# Patient Record
Sex: Female | Born: 1965 | Race: Black or African American | Hispanic: No | Smoking: Never smoker
Health system: Southern US, Community
[De-identification: ages and names within clinical notes are randomized; demographics above are authoritative.]

## PROBLEM LIST (undated history)

## (undated) ENCOUNTER — Emergency Department (HOSPITAL_BASED_OUTPATIENT_CLINIC_OR_DEPARTMENT_OTHER): Payer: Managed Care, Other (non HMO) | Source: Home / Self Care

## (undated) ENCOUNTER — Emergency Department (HOSPITAL_COMMUNITY): Discharge status: Absent without leave | Payer: Managed Care, Other (non HMO)

## (undated) DIAGNOSIS — I10 Essential (primary) hypertension: Secondary | ICD-10-CM

## (undated) DIAGNOSIS — M069 Rheumatoid arthritis, unspecified: Secondary | ICD-10-CM

## (undated) DIAGNOSIS — K219 Gastro-esophageal reflux disease without esophagitis: Secondary | ICD-10-CM

## (undated) DIAGNOSIS — E7800 Pure hypercholesterolemia, unspecified: Secondary | ICD-10-CM

## (undated) DIAGNOSIS — T7840XA Allergy, unspecified, initial encounter: Secondary | ICD-10-CM

## (undated) DIAGNOSIS — K2970 Gastritis, unspecified, without bleeding: Secondary | ICD-10-CM

## (undated) DIAGNOSIS — Z8619 Personal history of other infectious and parasitic diseases: Secondary | ICD-10-CM

## (undated) DIAGNOSIS — M109 Gout, unspecified: Secondary | ICD-10-CM

## (undated) DIAGNOSIS — K760 Fatty (change of) liver, not elsewhere classified: Secondary | ICD-10-CM

## (undated) DIAGNOSIS — K589 Irritable bowel syndrome without diarrhea: Secondary | ICD-10-CM

## (undated) DIAGNOSIS — J45909 Unspecified asthma, uncomplicated: Secondary | ICD-10-CM

## (undated) DIAGNOSIS — K3184 Gastroparesis: Secondary | ICD-10-CM

## (undated) HISTORY — PX: REFRACTIVE SURGERY: SHX103

## (undated) HISTORY — DX: Gastro-esophageal reflux disease without esophagitis: K21.9

## (undated) HISTORY — PX: EYE SURGERY: SHX253

## (undated) HISTORY — DX: Gastroparesis: K31.84

## (undated) HISTORY — DX: Allergy, unspecified, initial encounter: T78.40XA

## (undated) HISTORY — PX: WISDOM TOOTH EXTRACTION: SHX21

## (undated) HISTORY — DX: Gastritis, unspecified, without bleeding: K29.70

## (undated) HISTORY — DX: Gout, unspecified: M10.9

## (undated) HISTORY — DX: Rheumatoid arthritis, unspecified: M06.9

## (undated) HISTORY — PX: CHOLECYSTECTOMY: SHX55

## (undated) HISTORY — DX: Unspecified asthma, uncomplicated: J45.909

## (undated) HISTORY — DX: Fatty (change of) liver, not elsewhere classified: K76.0

## (undated) HISTORY — DX: Personal history of other infectious and parasitic diseases: Z86.19

---

## 1997-08-09 ENCOUNTER — Other Ambulatory Visit: Admission: RE | Admit: 1997-08-09 | Discharge: 1997-08-09 | Payer: Self-pay | Admitting: Obstetrics and Gynecology

## 1997-11-29 ENCOUNTER — Encounter: Admission: RE | Admit: 1997-11-29 | Discharge: 1998-02-27 | Payer: Self-pay | Admitting: *Deleted

## 1998-03-07 ENCOUNTER — Encounter: Admission: RE | Admit: 1998-03-07 | Discharge: 1998-06-05 | Payer: Self-pay | Admitting: *Deleted

## 1998-07-04 ENCOUNTER — Encounter: Admission: RE | Admit: 1998-07-04 | Discharge: 1998-10-02 | Payer: Self-pay | Admitting: *Deleted

## 1998-11-16 ENCOUNTER — Encounter: Admission: RE | Admit: 1998-11-16 | Discharge: 1999-02-14 | Payer: Self-pay | Admitting: *Deleted

## 1999-01-01 ENCOUNTER — Other Ambulatory Visit: Admission: RE | Admit: 1999-01-01 | Discharge: 1999-01-01 | Payer: Self-pay | Admitting: *Deleted

## 1999-06-01 ENCOUNTER — Encounter: Payer: Self-pay | Admitting: *Deleted

## 1999-06-01 ENCOUNTER — Encounter: Admission: RE | Admit: 1999-06-01 | Discharge: 1999-06-01 | Payer: Self-pay | Admitting: *Deleted

## 1999-06-13 ENCOUNTER — Encounter: Admission: RE | Admit: 1999-06-13 | Discharge: 1999-09-11 | Payer: Self-pay | Admitting: *Deleted

## 1999-10-03 ENCOUNTER — Encounter: Admission: RE | Admit: 1999-10-03 | Discharge: 2000-01-01 | Payer: Self-pay | Admitting: *Deleted

## 1999-10-17 ENCOUNTER — Encounter: Admission: RE | Admit: 1999-10-17 | Discharge: 2000-01-15 | Payer: Self-pay | Admitting: *Deleted

## 1999-12-16 ENCOUNTER — Emergency Department (HOSPITAL_COMMUNITY): Admission: EM | Admit: 1999-12-16 | Discharge: 1999-12-16 | Payer: Self-pay | Admitting: Emergency Medicine

## 1999-12-16 ENCOUNTER — Encounter: Payer: Self-pay | Admitting: Emergency Medicine

## 2000-02-28 ENCOUNTER — Encounter (INDEPENDENT_AMBULATORY_CARE_PROVIDER_SITE_OTHER): Payer: Self-pay

## 2000-02-28 ENCOUNTER — Other Ambulatory Visit: Admission: RE | Admit: 2000-02-28 | Discharge: 2000-02-28 | Payer: Self-pay | Admitting: Obstetrics and Gynecology

## 2000-07-30 ENCOUNTER — Encounter: Admission: RE | Admit: 2000-07-30 | Discharge: 2000-10-28 | Payer: Self-pay | Admitting: *Deleted

## 2000-09-09 ENCOUNTER — Other Ambulatory Visit: Admission: RE | Admit: 2000-09-09 | Discharge: 2000-09-09 | Payer: Self-pay | Admitting: Obstetrics and Gynecology

## 2000-10-15 ENCOUNTER — Encounter: Payer: Self-pay | Admitting: *Deleted

## 2000-10-15 ENCOUNTER — Encounter: Admission: RE | Admit: 2000-10-15 | Discharge: 2000-10-15 | Payer: Self-pay | Admitting: *Deleted

## 2000-10-15 ENCOUNTER — Encounter (INDEPENDENT_AMBULATORY_CARE_PROVIDER_SITE_OTHER): Payer: Self-pay | Admitting: *Deleted

## 2000-11-26 ENCOUNTER — Encounter: Admission: RE | Admit: 2000-11-26 | Discharge: 2001-02-24 | Payer: Self-pay | Admitting: *Deleted

## 2000-12-09 ENCOUNTER — Other Ambulatory Visit: Admission: RE | Admit: 2000-12-09 | Discharge: 2000-12-09 | Payer: Self-pay | Admitting: Obstetrics and Gynecology

## 2001-03-03 ENCOUNTER — Other Ambulatory Visit: Admission: RE | Admit: 2001-03-03 | Discharge: 2001-03-03 | Payer: Self-pay | Admitting: Obstetrics and Gynecology

## 2001-09-28 ENCOUNTER — Encounter: Admission: RE | Admit: 2001-09-28 | Discharge: 2001-12-27 | Payer: Self-pay | Admitting: *Deleted

## 2001-10-27 ENCOUNTER — Encounter: Admission: RE | Admit: 2001-10-27 | Discharge: 2001-10-27 | Payer: Self-pay | Admitting: *Deleted

## 2001-10-27 ENCOUNTER — Encounter: Payer: Self-pay | Admitting: *Deleted

## 2001-12-28 ENCOUNTER — Encounter: Admission: RE | Admit: 2001-12-28 | Discharge: 2002-03-28 | Payer: Self-pay | Admitting: *Deleted

## 2001-12-28 ENCOUNTER — Other Ambulatory Visit: Admission: RE | Admit: 2001-12-28 | Discharge: 2001-12-28 | Payer: Self-pay | Admitting: Obstetrics and Gynecology

## 2002-05-26 ENCOUNTER — Encounter: Admission: RE | Admit: 2002-05-26 | Discharge: 2002-05-26 | Payer: Self-pay

## 2002-05-26 ENCOUNTER — Encounter: Admission: RE | Admit: 2002-05-26 | Discharge: 2002-08-24 | Payer: Self-pay

## 2002-06-08 ENCOUNTER — Encounter: Admission: RE | Admit: 2002-06-08 | Discharge: 2002-09-06 | Payer: Self-pay

## 2002-10-19 ENCOUNTER — Encounter: Admission: RE | Admit: 2002-10-19 | Discharge: 2003-01-17 | Payer: Self-pay

## 2003-01-06 ENCOUNTER — Other Ambulatory Visit: Admission: RE | Admit: 2003-01-06 | Discharge: 2003-01-06 | Payer: Self-pay | Admitting: Obstetrics and Gynecology

## 2003-02-28 ENCOUNTER — Encounter: Admission: RE | Admit: 2003-02-28 | Discharge: 2003-05-29 | Payer: Self-pay

## 2003-06-07 ENCOUNTER — Encounter: Admission: RE | Admit: 2003-06-07 | Discharge: 2003-06-07 | Payer: Self-pay | Admitting: Family Medicine

## 2003-07-19 ENCOUNTER — Encounter: Admission: RE | Admit: 2003-07-19 | Discharge: 2003-10-17 | Payer: Self-pay | Admitting: Family Medicine

## 2004-01-09 ENCOUNTER — Other Ambulatory Visit: Admission: RE | Admit: 2004-01-09 | Discharge: 2004-01-09 | Payer: Self-pay | Admitting: Obstetrics and Gynecology

## 2004-01-31 ENCOUNTER — Encounter: Admission: RE | Admit: 2004-01-31 | Discharge: 2004-01-31 | Payer: Self-pay

## 2004-05-14 ENCOUNTER — Encounter: Admission: RE | Admit: 2004-05-14 | Discharge: 2004-05-14 | Payer: Self-pay | Admitting: Neurology

## 2004-06-19 ENCOUNTER — Encounter: Admission: RE | Admit: 2004-06-19 | Discharge: 2004-06-19 | Payer: Self-pay | Admitting: Family Medicine

## 2004-06-19 ENCOUNTER — Encounter (INDEPENDENT_AMBULATORY_CARE_PROVIDER_SITE_OTHER): Payer: Self-pay | Admitting: *Deleted

## 2004-07-09 ENCOUNTER — Encounter: Admission: RE | Admit: 2004-07-09 | Discharge: 2004-10-07 | Payer: Self-pay | Admitting: Family Medicine

## 2004-07-11 ENCOUNTER — Ambulatory Visit: Payer: Self-pay | Admitting: Internal Medicine

## 2004-10-29 ENCOUNTER — Encounter: Admission: RE | Admit: 2004-10-29 | Discharge: 2005-01-27 | Payer: Self-pay | Admitting: Family Medicine

## 2004-11-01 ENCOUNTER — Ambulatory Visit: Payer: Self-pay | Admitting: Internal Medicine

## 2005-01-21 ENCOUNTER — Other Ambulatory Visit: Admission: RE | Admit: 2005-01-21 | Discharge: 2005-01-21 | Payer: Self-pay | Admitting: Obstetrics and Gynecology

## 2005-10-21 ENCOUNTER — Ambulatory Visit: Payer: Self-pay | Admitting: Internal Medicine

## 2005-10-22 ENCOUNTER — Ambulatory Visit: Payer: Self-pay | Admitting: Internal Medicine

## 2005-12-13 ENCOUNTER — Encounter: Admission: RE | Admit: 2005-12-13 | Discharge: 2005-12-13 | Payer: Self-pay | Admitting: Family Medicine

## 2006-02-07 ENCOUNTER — Other Ambulatory Visit: Admission: RE | Admit: 2006-02-07 | Discharge: 2006-02-07 | Payer: Self-pay | Admitting: Obstetrics and Gynecology

## 2006-04-21 ENCOUNTER — Encounter: Admission: RE | Admit: 2006-04-21 | Discharge: 2006-04-21 | Payer: Self-pay | Admitting: Family Medicine

## 2006-06-03 ENCOUNTER — Encounter: Admission: RE | Admit: 2006-06-03 | Discharge: 2006-09-01 | Payer: Self-pay | Admitting: Family Medicine

## 2006-09-10 ENCOUNTER — Encounter: Admission: RE | Admit: 2006-09-10 | Discharge: 2006-09-10 | Payer: Self-pay | Admitting: Family Medicine

## 2006-12-21 ENCOUNTER — Emergency Department (HOSPITAL_COMMUNITY): Admission: EM | Admit: 2006-12-21 | Discharge: 2006-12-21 | Payer: Self-pay | Admitting: Emergency Medicine

## 2007-01-14 ENCOUNTER — Encounter: Admission: RE | Admit: 2007-01-14 | Discharge: 2007-01-14 | Payer: Self-pay | Admitting: Family Medicine

## 2007-02-09 ENCOUNTER — Other Ambulatory Visit: Admission: RE | Admit: 2007-02-09 | Discharge: 2007-02-09 | Payer: Self-pay | Admitting: Obstetrics and Gynecology

## 2007-06-05 ENCOUNTER — Encounter: Admission: RE | Admit: 2007-06-05 | Discharge: 2007-06-05 | Payer: Self-pay | Admitting: Family Medicine

## 2007-06-05 ENCOUNTER — Encounter (INDEPENDENT_AMBULATORY_CARE_PROVIDER_SITE_OTHER): Payer: Self-pay | Admitting: *Deleted

## 2008-03-01 ENCOUNTER — Telehealth: Payer: Self-pay | Admitting: Internal Medicine

## 2008-03-03 DIAGNOSIS — I10 Essential (primary) hypertension: Secondary | ICD-10-CM | POA: Insufficient documentation

## 2008-03-03 DIAGNOSIS — K589 Irritable bowel syndrome without diarrhea: Secondary | ICD-10-CM | POA: Insufficient documentation

## 2008-03-03 DIAGNOSIS — K625 Hemorrhage of anus and rectum: Secondary | ICD-10-CM | POA: Insufficient documentation

## 2008-03-03 DIAGNOSIS — I152 Hypertension secondary to endocrine disorders: Secondary | ICD-10-CM | POA: Insufficient documentation

## 2008-03-03 DIAGNOSIS — K219 Gastro-esophageal reflux disease without esophagitis: Secondary | ICD-10-CM | POA: Insufficient documentation

## 2008-03-03 DIAGNOSIS — E785 Hyperlipidemia, unspecified: Secondary | ICD-10-CM | POA: Insufficient documentation

## 2008-03-03 DIAGNOSIS — K582 Mixed irritable bowel syndrome: Secondary | ICD-10-CM | POA: Insufficient documentation

## 2008-03-07 ENCOUNTER — Ambulatory Visit: Payer: Self-pay | Admitting: Internal Medicine

## 2008-03-08 ENCOUNTER — Telehealth: Payer: Self-pay | Admitting: Internal Medicine

## 2008-03-09 DIAGNOSIS — M549 Dorsalgia, unspecified: Secondary | ICD-10-CM | POA: Insufficient documentation

## 2008-03-16 ENCOUNTER — Ambulatory Visit: Payer: Self-pay | Admitting: Internal Medicine

## 2008-05-25 ENCOUNTER — Encounter: Admission: RE | Admit: 2008-05-25 | Discharge: 2008-05-25 | Payer: Self-pay | Admitting: Allergy

## 2008-10-22 ENCOUNTER — Encounter: Admission: RE | Admit: 2008-10-22 | Discharge: 2008-10-22 | Payer: Self-pay | Admitting: Family Medicine

## 2008-10-22 ENCOUNTER — Encounter (INDEPENDENT_AMBULATORY_CARE_PROVIDER_SITE_OTHER): Payer: Self-pay | Admitting: *Deleted

## 2008-11-13 ENCOUNTER — Emergency Department (HOSPITAL_COMMUNITY): Admission: EM | Admit: 2008-11-13 | Discharge: 2008-11-13 | Payer: Self-pay | Admitting: Emergency Medicine

## 2009-03-04 ENCOUNTER — Encounter: Admission: RE | Admit: 2009-03-04 | Discharge: 2009-03-04 | Payer: Self-pay | Admitting: Orthopedic Surgery

## 2009-03-11 ENCOUNTER — Emergency Department (HOSPITAL_COMMUNITY): Admission: EM | Admit: 2009-03-11 | Discharge: 2009-03-12 | Payer: Self-pay | Admitting: Emergency Medicine

## 2009-03-11 ENCOUNTER — Encounter: Payer: Self-pay | Admitting: Cardiology

## 2009-03-13 ENCOUNTER — Telehealth: Payer: Self-pay | Admitting: Cardiology

## 2009-03-13 ENCOUNTER — Telehealth: Payer: Self-pay | Admitting: Internal Medicine

## 2009-03-15 ENCOUNTER — Ambulatory Visit: Payer: Self-pay | Admitting: Internal Medicine

## 2009-03-15 DIAGNOSIS — E1151 Type 2 diabetes mellitus with diabetic peripheral angiopathy without gangrene: Secondary | ICD-10-CM | POA: Insufficient documentation

## 2009-03-17 ENCOUNTER — Telehealth: Payer: Self-pay | Admitting: Cardiology

## 2009-04-05 ENCOUNTER — Ambulatory Visit: Payer: Self-pay | Admitting: Cardiology

## 2009-04-05 ENCOUNTER — Encounter: Payer: Self-pay | Admitting: Physician Assistant

## 2009-04-05 DIAGNOSIS — E669 Obesity, unspecified: Secondary | ICD-10-CM | POA: Insufficient documentation

## 2009-04-05 DIAGNOSIS — R002 Palpitations: Secondary | ICD-10-CM | POA: Insufficient documentation

## 2009-04-05 DIAGNOSIS — I517 Cardiomegaly: Secondary | ICD-10-CM | POA: Insufficient documentation

## 2009-04-07 LAB — CONVERTED CEMR LAB: TSH: 0.94 microintl units/mL (ref 0.35–5.50)

## 2009-04-24 ENCOUNTER — Ambulatory Visit: Payer: Self-pay | Admitting: Cardiology

## 2009-04-24 ENCOUNTER — Encounter: Payer: Self-pay | Admitting: Cardiology

## 2009-04-24 ENCOUNTER — Ambulatory Visit: Payer: Self-pay

## 2009-04-24 ENCOUNTER — Ambulatory Visit (HOSPITAL_COMMUNITY): Admission: RE | Admit: 2009-04-24 | Discharge: 2009-04-24 | Payer: Self-pay | Admitting: Cardiology

## 2009-05-03 ENCOUNTER — Encounter: Payer: Self-pay | Admitting: Cardiology

## 2009-05-05 ENCOUNTER — Telehealth: Payer: Self-pay | Admitting: Internal Medicine

## 2009-05-12 ENCOUNTER — Ambulatory Visit: Payer: Self-pay | Admitting: Cardiology

## 2009-05-12 DIAGNOSIS — R079 Chest pain, unspecified: Secondary | ICD-10-CM | POA: Insufficient documentation

## 2009-05-26 ENCOUNTER — Encounter: Payer: Self-pay | Admitting: Internal Medicine

## 2009-05-30 ENCOUNTER — Ambulatory Visit: Payer: Self-pay | Admitting: Internal Medicine

## 2009-05-30 LAB — CONVERTED CEMR LAB
Fecal Occult Blood: NEGATIVE
OCCULT 1: NEGATIVE
OCCULT 2: NEGATIVE
OCCULT 3: NEGATIVE
OCCULT 4: NEGATIVE
OCCULT 5: NEGATIVE

## 2009-06-20 ENCOUNTER — Ambulatory Visit: Payer: Self-pay | Admitting: Cardiology

## 2009-06-28 ENCOUNTER — Encounter: Payer: Self-pay | Admitting: Cardiology

## 2009-09-25 ENCOUNTER — Telehealth: Payer: Self-pay | Admitting: Internal Medicine

## 2010-01-16 ENCOUNTER — Ambulatory Visit: Payer: Self-pay | Admitting: Internal Medicine

## 2010-05-15 ENCOUNTER — Telehealth: Payer: Self-pay | Admitting: Internal Medicine

## 2010-05-17 ENCOUNTER — Telehealth: Payer: Self-pay | Admitting: Internal Medicine

## 2010-05-18 ENCOUNTER — Encounter: Payer: Self-pay | Admitting: Internal Medicine

## 2010-05-18 ENCOUNTER — Ambulatory Visit
Admission: RE | Admit: 2010-05-18 | Discharge: 2010-05-18 | Payer: Self-pay | Source: Home / Self Care | Attending: Internal Medicine | Admitting: Internal Medicine

## 2010-05-18 ENCOUNTER — Other Ambulatory Visit: Payer: Self-pay | Admitting: Internal Medicine

## 2010-05-18 DIAGNOSIS — R109 Unspecified abdominal pain: Secondary | ICD-10-CM | POA: Insufficient documentation

## 2010-05-18 DIAGNOSIS — J328 Other chronic sinusitis: Secondary | ICD-10-CM | POA: Insufficient documentation

## 2010-05-24 NOTE — Letter (Signed)
Summary: Physician Documentation Sheet  Physician Documentation Sheet   Imported By: Kassie Mends 05/08/2009 14:28:05  _____________________________________________________________________  External Attachment:    Type:   Image     Comment:   External Document

## 2010-05-24 NOTE — Progress Notes (Signed)
Summary: Hemorroids  Phone Note Call from Patient Call back at [phone]   Call For: DR Blair Mesina Reason for Call: Talk to Nurse Summary of Call: 2WKhaving difficulties with her hemorroids. Her primary care Dr Sonia Baller resigned and wonder if we can give her some advice until her follow up on 04-04-08. Initial call taken by: Leanor Kail [facility],  March 01, 2008 12:39 PM  Follow-up for Phone Call        Spoke with patient this afternoom c/o hemmorroids. small amout blood on tissue after wiping, c/o itching and irratation in the anal area.  Has been going on for 2-3 weeks. Patient also c/o constipation.  Patient was seeing PCP for this problem but PCP left practice. 1) anusol HC Suppository 1 pr q HS x 7-10 days SITZ bath TID TUCS pads to rectum TID use baby wipes instead of toilet paper  2) Pt instructed to begin Miralax 17 gm or 1 capfull in 8oz of liquid daily as needed 3) keep appointment 0n Nov. 16th at 3pm 4) Pt instructed to call back as needed for any problems, questions, or concerns 5) I will call patient if any new orders after md has reviewed note.          Follow-up by: Paulene Floor, RN,  March 01, 2008 3:24 PM  Additional Follow-up for Phone Call Additional follow up Details #1::        I agree. Additional Follow-up by: Hart Carwin MD,  March 01, 2008 9:45 PM    New/Updated Medications: ANUSOL-HC 25 MG SUPP (HYDROCORTISONE ACETATE) 1! supp in rectum at night for10  days   Prescriptions: ANUSOL-HC 25 MG SUPP (HYDROCORTISONE ACETATE) 1! supp in rectum at night for10  days  #10 x 1   Entered by:   Paulene Floor, RN   Authorized by:   Hart Carwin MD   Signed by:   Paulene Floor, RN on 03/01/2008   Method used:   Electronically to        [facility]. [phone]* (retail)       [address]       [place], [place]  [address]       Ph: [phone]       Fax: [phone]   RxID:   [id number]

## 2010-05-24 NOTE — Progress Notes (Signed)
Summary: speak to nurse  Phone Note Call from Patient Call back at Home Phone [phone]   Caller: Patient Call For: Dr Juanda Chance Reason for Call: Talk to Nurse Summary of Call: Patient wants to speak to nurse regarding her appt tomorrow. Initial call taken by: Tawni Levy,  May 17, 2010 3:47 PM  Follow-up for Phone Call        Patient called to r/s appointment to next week because she started her cycle. Told her MD is not in office for the next 2 weeks. She decided to keep the appointment for tomorrow. Follow-up by: Jesse Fall RN,  May 17, 2010 4:00 PM

## 2010-05-24 NOTE — Progress Notes (Signed)
Summary: Zegrid samples  Phone Note Call from Patient Call back at [facility] Phone [phone]   Call For: Dr Juanda Chance Reason for Call: Talk to Nurse Summary of Call: Wonders if we have any samples of Zegrid? Initial call taken by: Leanor Kail [facility],  September 25, 2009 3:45 PM  Follow-up for Phone Call        Advised patient that we have no samples of Zegerid, however, we will give her 2 boxes of Dexilant until she gets paid (and can get her Zegerid refilled).  Samples Given #2 boxes   -Lot Number: [id number] e exp: 10/12 Follow-up by: Lamona Curl CMA (AAMA),  September 25, 2009 4:28 PM

## 2010-05-24 NOTE — Assessment & Plan Note (Signed)
Summary: eph/jml   Visit Type:  Follow-up Primary Provider:  Noel Journey, MD  CC:  palpitations.  History of Present Illness: This is a 45 year old, African American female, patient, who is referred here from the muscles to the emergency room. She went to the ER November 20 with palpitations. She had 3 days of intermittent rapid heart beat that would last 15-30 seconds at a time. She said just would race and then stop. She denied any skipping, dizziness, or presyncope. She denies chest pain or dyspnea. She has a remote history of this and saw Dr. Charlies Constable in the past at which time she has had a stress test and an echocardiogram many years ago. She has been told she has an enlarged heart, but has not had a followup. In the ER. Her first CK was elevated in the 400 range, but MBs were negative. Troponins were negative. Her repeat CK was negative. TSH was not performed. Potassium is 3.7, and they did give her 2 tablets. She has not had any recurrent palpitations since then.  She drinks about 2 cups of tea per day, but otherwise does not drink excessive caffeine. She is overweight and has lost 11 pounds, despite cutting back on the fried foods, that she eats, but knows she needs to lose weight. She does not exercise on a regular basis, but does not have palpitations when she exerts herself. She says she is under quite a bit of stress recently as she is trying to plan her own wedding for next September.   Problems Prior to Update: 1)  Cardiomegaly  (ICD-429.3) 2)  Palpitations  (ICD-785.1) 3)  Diabetes Mellitus  (ICD-250.00) 4)  Back Pain  (ICD-724.5) 5)  Rectal Bleeding  (ICD-569.3) 6)  Hyperlipidemia  (ICD-272.4) 7)  Hypertension  (ICD-401.9) 8)  Gerd  (ICD-530.81) 9)  Irritable Bowel Syndrome  (ICD-564.1)  Current Medications (verified): 1)  Anusol-Hc 25 Mg Supp (Hydrocortisone Acetate) .Marland Kitchen.. 1! Supp in Rectum At Night For10  Days  2)  Benicar 20 Mg Tabs (Olmesartan Medoxomil) .... One Tablet By Mouth Once Daily 3)  Dyazide 37.5-25 Mg Caps (Triamterene-Hctz) .... One Tablet By Mouth Once Daily 4)  Zegerid 40-1100 Mg Caps (Omeprazole-Sodium Bicarbonate) .... One Tablet By Mouth Two Times A Day 5)  Lovaza 1 Gm Caps (Omega-3-Acid Ethyl Esters) .... One Tablet By Mouth Two Times A Day 6)  Zyrtec Allergy 10 Mg Tabs (Cetirizine Hcl) .... One Tablet By Mouth Once Daily 7)  Aspirin 81 Mg Tbec (Aspirin) .... Take One By Mouth Once Daily 8)  Januvia 100 Mg Tabs (Sitagliptin Phosphate) .... Take One By Mouth Once Daily 9)  Vitamin D (Ergocalciferol) 50000 Unit Caps (Ergocalciferol) .... Take One By Mouth Twice A Week 10)  Celebrex 200 Mg Caps (Celecoxib) .... Take One or Two By Mouth Once Daily 11)  Xyzal 5 Mg Tabs (Levocetirizine Dihydrochloride) .... Take One By Mouth Once Daily 12)  Analpram E 2.5-1 & 1 % Kit (Hydrocortisone Ace-Pramoxine) .... Apply To Rectum Two Times A Day  Allergies: 1)  ! Ace Inhibitors  Past History:  Past Medical History: Last updated: 03/15/2009 Current Problems:  DIABETES MELLITUS (ICD-250.00) BACK PAIN (ICD-724.5) RECTAL BLEEDING (ICD-569.3) HYPERLIPIDEMIA (ICD-272.4) HYPERTENSION (ICD-401.9) GERD (ICD-530.81) IRRITABLE BOWEL SYNDROME (ICD-564.1)  Family History: Reviewed history from 03/15/2009 and no changes required. No FH of Colon Cancer Family History of Prostate Cancer: Father Family History of Diabetes: Mother, paternal grandmother, father Family History of Heart Disease: mother  Social History: Reviewed history from 03/07/2008  and no changes required. Alcohol Use - no Illicit Drug Use - no Patient has never smoked.  Daily Caffeine Use 2cups tea/day  Review of Systems       see history of present illness  Vital Signs:  Patient profile:   45 year old female Height:      70 inches Weight:      291 pounds Pulse rate:   92 / minute Pulse rhythm:   regular  BP sitting:   130 / 76  (left arm)  Vitals Entered By: Jacquelin Hawking, CMA (April 05, 2009 12:17 PM)  Physical Exam  General:  Obese, female, in no acute distress. Neck: No JVD, HJR, Bruit, or thyroid enlargement Lungs: No tachypnea, clear without wheezing, rales, or rhonchi Cardiovascular: RRR, PMI not displaced, heart sounds normal, no murmurs, gallops, bruit, thrill, or heave. Abdomen: BS normal. Soft without organomegaly, masses, lesions or tenderness. Extremities: without cyanosis, clubbing or edema. Good distal pulses bilateral SKin: Warm, no lesions or rashes  Musculoskeletal: No deformities Neuro: no focal signs    EKG  Procedure date:  04/05/2009  Findings:      normal sinus rhythm, normal EKG  Impression & Recommendations:  Problem # 1:  PALPITATIONS (ICD-785.1) Patient had 3 days of intermittent palpitations that have resolved. She has a remote history of this. I asked her to cut back on her caffeine and to call service. Returns. If it does we will place him to record her on her. We will also check a 2-D echo with her history of cardiomegaly and palpitations. We will also check a TSH. Her updated medication list for this problem includes:    Aspirin 81 Mg Tbec (Aspirin) .Marland Kitchen... Take one by mouth once daily  Orders: EKG w/ Interpretation (93000) Echocardiogram (Echo) TLB-TSH (Thyroid Stimulating Hormone) (84443-TSH)  Problem # 2:  CARDIOMEGALY (ICD-429.3) 2-D echo ordered Orders: Echocardiogram (Echo)  Problem # 3:  HYPERTENSION (ICD-401.9) blood pressure is controlled Her updated medication list for this problem includes:    Benicar 20 Mg Tabs (Olmesartan medoxomil) ..... One tablet by mouth once daily    Dyazide 37.5-25 Mg Caps (Triamterene-hctz) ..... One tablet by mouth once daily    Aspirin 81 Mg Tbec (Aspirin) .Marland Kitchen... Take one by mouth once daily  Problem # 4:  OBESITY-MORBID (>100') (ICD-278.01)  patient is morbidly obese. I showed her a BMI scale, and she is very willing to try to lose weight. Ideally, she should be 167 on the high-end. I recommended Weight Watchers her similar program, which she will look into.  Patient Instructions: 1)  Your physician recommends that you schedule a follow-up appointment in: 1 month with Dr. Antoine Poche 2)  Call the office if  further palpitation occurr. We will order schedule an event recorder. 3)  Your physician has requested that you have an echocardiogram.  Echocardiography is a painless test that uses sound waves to create images of your heart. It provides your doctor with information about the size and shape of your heart and how well your heart's chambers and valves are working.  This procedure takes approximately one hour. There are no restrictions for this procedure. 4)  Weight watchers for weight loss

## 2010-05-24 NOTE — Assessment & Plan Note (Signed)
Summary: RECTAL BLEEDING, PAINFUL BM'S             Maria Sharp   History of Present Illness Visit Type: Follow-up Visit Primary GI MD: [name] MD Primary Provider: Noel Journey, MD Chief Complaint: Patient complains of some rectal pain and BRB on the tissue. She denies abdominal pain and constipation. Patient states that she is also having increased reflux and epigastric pain and burning. Her ENt increased her Zegerid to twice a day but not helping much.  History of Present Illness:   This is a 45 year old African American female with rectal bleeding. A colonoscopy done in November 2009 showed a normal colon. Her bleeding at that point was thought to be of anal origin. She works on third shift and often has little time to go to the bathroom. She experiences bright red blood on the toilet tissue and some rectal discomfort when she wipes.   GI Review of Systems    Reports abdominal pain and  acid reflux.     Location of  Abdominal pain: epigastric area.    Denies belching, bloating, chest pain, dysphagia with liquids, dysphagia with solids, heartburn, loss of appetite, nausea, vomiting, vomiting blood, weight loss, and  weight gain.      Reports rectal bleeding and  rectal pain.     Denies anal fissure, black tarry stools, change in bowel habit, constipation, diarrhea, diverticulosis, fecal incontinence, heme positive stool, hemorrhoids, irritable bowel syndrome, jaundice, light color stool, and  liver problems.    Current Medications (verified): 1)  Anusol-Hc 25 Mg Supp (Hydrocortisone Acetate) .Marland Kitchen.. 1! Supp in Rectum At Night For10  Days 2)  Ortho-Cyclen (28) 0.25-35 Mg-Mcg Tabs (Norgestimate-Eth Estradiol) .... One Tablet By Mouth Once Daily 3)  Benicar 20 Mg Tabs (Olmesartan Medoxomil) .... One Tablet By Mouth Once Daily 4)  Dyazide 37.5-25 Mg Caps (Triamterene-Hctz) .... One Tablet By Mouth Once Daily  5)  Zegerid 40-1100 Mg Caps (Omeprazole-Sodium Bicarbonate) .... One Tablet By Mouth Two Times A Day 6)  Lovaza 1 Gm Caps (Omega-3-Acid Ethyl Esters) .... One Tablet By Mouth Two Times A Day 7)  Zyrtec Allergy 10 Mg Tabs (Cetirizine Hcl) .... One Tablet By Mouth Once Daily 8)  Aspirin 81 Mg Tbec (Aspirin) .... Take One By Mouth Once Daily 9)  Januvia 100 Mg Tabs (Sitagliptin Phosphate) .... Take One By Mouth Once Daily 10)  Vitamin D (Ergocalciferol) 50000 Unit Caps (Ergocalciferol) .... Take One By Mouth Twice A Week 11)  Celebrex 200 Mg Caps (Celecoxib) .... Take One or Two By Mouth Once Daily 12)  Xyzal 5 Mg Tabs (Levocetirizine Dihydrochloride) .... Take One By Mouth Once Daily  Allergies (verified): 1)  ! Ace Inhibitors  Past History:  Past Medical History: Current Problems:  DIABETES MELLITUS (ICD-250.00) BACK PAIN (ICD-724.5) RECTAL BLEEDING (ICD-569.3) HYPERLIPIDEMIA (ICD-272.4) HYPERTENSION (ICD-401.9) GERD (ICD-530.81) IRRITABLE BOWEL SYNDROME (ICD-564.1)  Past Surgical History: Reviewed history from 03/03/2008 and no changes required. cholecystectomy  Family History: No FH of Colon Cancer Family History of Prostate Cancer: Father Family History of Diabetes: Mother, paternal grandmother, father Family History of Heart Disease: mother  Review of Systems        The patient complains of allergy/sinus, arthritis/joint pain, back pain, cough, and muscle pains/cramps.  The patient denies anemia, anxiety-new, blood in urine, breast changes/lumps, change in vision, confusion, coughing up blood, depression-new, fainting, fatigue, fever, headaches-new, hearing problems, heart murmur, heart rhythm changes, itching, menstrual pain, night sweats, nosebleeds, pregnancy symptoms, shortness of breath, skin rash, sleeping  problems, sore throat, swelling of feet/legs, swollen lymph glands, thirst - excessive , urination - excessive , urination changes/pain, urine leakage, vision changes, and voice change.         Pertinent positive and negative review of systems were noted in the above HPI. All other ROS was otherwise negative.   Vital Signs:  Patient profile:   45 year old female Height:      70 inches Weight:      292.8 pounds BMI:     42.16 Pulse rate:   92 / minute Pulse rhythm:   regular BP sitting:   130 / 74  (left arm) Cuff size:   large  Vitals Entered By: Harlow Mares CMA Duncan Dull) (March 15, 2009 11:44 AM)  Physical Exam  General:  Well developed, well nourished, no acute distress. Lungs:  Clear throughout to auscultation. Heart:  Regular rate and rhythm; no murmurs, rubs,  or bruits. Abdomen:  Soft, nontender and nondistended. No masses, hepatosplenomegaly or hernias noted. Normal bowel sounds. Rectal:  rectal and anoscopy exam reveals normal perianal area, normal anal canal, first grade internal hemorrhoids; 2 small hemorrhoids noted to be hyperemic and edematous. There was no active bleeding. Stool was Hemoccult-positive. Patient was having her menses at the time of exam.   Impression & Recommendations:  Problem # 1:  RECTAL BLEEDING (ICD-569.3)  Patient has recurrent low grade rectal bleeding associated with rectal discomfort most likely from a first-grade hemorrhoids. She is Hemoccult-positive today due to having her period. We will start her on Analpram E cream 2.5% 2-3 times a day and we will have her repeat Hemoccult cards approximately 2 weeks after her period. There is no need for a repeat colonoscopy at this time. Orders: [facility] Hemoccult Cards #3 (take home) (Hem cards #3)  Patient Instructions: 1)  Patient given hemoccult cards to complete at home and send back. 2)  Analpram E Rx given. 3)  Copy sent to : Noel Journey, MD Prescriptions: Adron Bene E 2.5-1 & 1 % KIT (HYDROCORTISONE ACE-PRAMOXINE) Apply to rectum two times a day  #1 kit x 1   Entered by:   Hortense Ramal CMA (AAMA)   Authorized by:   Hart Carwin MD   Signed by:   Hortense Ramal CMA (AAMA) on 03/15/2009   Method used:   Electronically to        [facility]. [phone]* (retail)       [address]       [place], [place]  [address]       Ph: [phone]       Fax: [phone]   RxID:   [id number]

## 2010-05-24 NOTE — Assessment & Plan Note (Signed)
Summary: abd pain--ch.   History of Present Illness Visit Type: Follow-up Visit Primary GI MD: [name] MD Primary Provider: Noel Journey, MD Chief Complaint: Abdominal pain x 1 month History of Present Illness:   This is a 45 year old African American female with irritable bowel syndrome and gastroesophageal reflux. She is having a flareup of her IBS. She has missed work twice this month because of crampy abdominal pain and diarrhea. She works for the [employer] and now her work hours are at night from 7 PM to 4 AM. She is off on the weekends. She takes Zegerid 40 mg a day but often cannot afford it. A colonoscopy in November 2009 was negative. An abdominal ultrasound in 2002 showed gallstones. She underwent a laparoscopic cholecystectomy in May 2004. A CT scan of the abdomen in February 2006 and in July 2010 showed her to be status post cholecystectomy and a retroflexed  uterus, there were 2 small ovarian cysts.   GI Review of Systems    Reports abdominal pain, acid reflux, and  bloating.      Denies belching, chest pain, dysphagia with liquids, dysphagia with solids, heartburn, loss of appetite, nausea, vomiting, vomiting blood, weight loss, and  weight gain.      Reports diarrhea and  irritable bowel syndrome.     Denies anal fissure, black tarry stools, change in bowel habit, constipation, diverticulosis, fecal incontinence, heme positive stool, hemorrhoids, jaundice, light color stool, liver problems, rectal bleeding, and  rectal pain.    Current Medications (verified): 1)  Benicar 20 Mg Tabs (Olmesartan Medoxomil) .... One Tablet By Mouth Once Daily 2)  Dyazide 37.5-25 Mg Caps (Triamterene-Hctz) .... One Tablet By Mouth Once Daily 3)  Zegerid 40-1100 Mg Caps (Omeprazole-Sodium Bicarbonate) .... One Tablet By Mouth Two Times A Day 4)  Lovastatin 40 Mg Tabs (Lovastatin) .... Once Daily 5)  Zyrtec Allergy 10 Mg Tabs (Cetirizine Hcl) .... One Tablet By Mouth Once Daily 6)  Aspirin 81  Mg Tbec (Aspirin) .... Take One By Mouth Once Daily 7)  Januvia 100 Mg Tabs (Sitagliptin Phosphate) .... Take One By Mouth Once Daily 8)  Vitamin D (Ergocalciferol) 50000 Unit Caps (Ergocalciferol) .... Take One By Mouth Twice A Week 9)  Celebrex 200 Mg Caps (Celecoxib) .... Take One or Two By Mouth Once Daily 10)  Xyzal 5 Mg Tabs (Levocetirizine Dihydrochloride) .... Take One By Mouth Once Daily 11)  Astepro 0.15 % Soln (Azelastine Hcl) .... As Needed 12)  Omnaris 50 Mcg/act Susp (Ciclesonide) .... As Needed  Allergies (verified): 1)  ! Ace Inhibitors  Past History:  Past Medical History: Reviewed history from 05/12/2009 and no changes required. Current Problems:  DIABETES MELLITUS (ICD-250.00) x 12 years BACK PAIN (ICD-724.5) RECTAL BLEEDING (ICD-569.3) HYPERLIPIDEMIA (ICD-272.4) x 5 HYPERTENSION (ICD-401.9) x 25 years GERD (ICD-530.81) IRRITABLE BOWEL SYNDROME (ICD-564.1)  Past Surgical History: Reviewed history from 05/12/2009 and no changes required. Cholecystectomy  Family History: Reviewed history from 03/15/2009 and no changes required. No FH of Colon Cancer Family History of Prostate Cancer: Father Family History of Diabetes: Mother, paternal grandmother, father Family History of Heart Disease: mother  Social History: Alcohol Use - no Illicit Drug Use - no Patient has never smoked.  Daily Caffeine Use 2cups tea/day Occupation: Paramedic  Review of Systems  The patient denies allergy/sinus, anemia, anxiety-new, arthritis/joint pain, back pain, blood in urine, breast changes/lumps, change in vision, confusion, cough, coughing up blood, depression-new, fainting, fatigue, fever, headaches-new, hearing problems, heart murmur, heart rhythm changes,  itching, menstrual pain, muscle pains/cramps, night sweats, nosebleeds, pregnancy symptoms, shortness of breath, skin rash, sleeping problems, sore throat, swelling of feet/legs, swollen lymph glands, thirst - excessive ,  urination - excessive , urination changes/pain, urine leakage, vision changes, and voice change.         Pertinent positive and negative review of systems were noted in the above HPI. All other ROS was otherwise negative.   Vital Signs:  Patient profile:   45 year old female Height:      70 inches Weight:      278 pounds BMI:     40.03 Pulse rate:   64 / minute Pulse rhythm:   regular BP sitting:   110 / 80  (left arm) Cuff size:   large  Vitals Entered By: June McMurray CMA Duncan Dull) (January 16, 2010 2:28 PM)  Physical Exam  General:  Well developed, well nourished, no acute distress. Eyes:  PERRLA, no icterus. Mouth:  No deformity or lesions, dentition normal. Neck:  Supple; no masses or thyromegaly. Lungs:  Clear throughout to auscultation. Heart:  Regular rate and rhythm; no murmurs, rubs,  or bruits. Abdomen:  obese soft abdomen with tenderness and right lower quadrant. Normoactive bowel sounds. No tympany. Liver edge at costal margin. Extremities:  No clubbing, cyanosis, edema or deformities noted. Skin:  Intact without significant lesions or rashes. Psych:  Alert and cooperative. Normal mood and affect.   Impression & Recommendations:  Problem # 1:  IRRITABLE BOWEL SYNDROME (ICD-564.1) Patient has irritable bowel syndrome with predominant diarrhea. We will start her on Bentyl 10 mg 3 times a day and a high-fiber diet. She will bring her FMLA forms for me to fill out when she misses work. She is up-to-date on her colonoscopy.  Problem # 2:  OBESITY-MORBID (>100') (ICD-278.01) Patient needs to lose weight.  Patient Instructions: 1)  samples of AcipHex 20 mg daily. 2)  Samples of Align one p.o. q.d. 3)  Bentyl 10 mg p.o. t.i.d. for IBS. 4)  Return p.r.n. 5)  Bring FMLA forms for me to fill out. 6)  Copy sent to :Dr Candise Bowens.Stone  7)  The medication list was reviewed and reconciled.  All changed / newly prescribed medications were explained.  A complete medication list  was provided to the patient / caregiver. Prescriptions: BENTYL 10 MG CAPS (DICYCLOMINE HCL) Take 1 capsule by mouth three times a day  #90 x 3   Entered by:   Lamona Curl CMA (AAMA)   Authorized by:   Hart Carwin MD   Signed by:   Lamona Curl CMA (AAMA) on 01/16/2010   Method used:   Electronically to        [facility]. [phone]* (retail)       [address]       [place], [place]  [address]       Ph: [phone]       Fax: [phone]   RxID:   [id number]

## 2010-05-24 NOTE — Progress Notes (Signed)
Summary: TRIAGE  Phone Note Call from Patient Call back at Home Phone [phone]   Call For: Dr Juanda Chance Summary of Call: IBS flare up-would like to be seen sooner than next available on Jan 3  Initial call taken by: Leanor Kail [facility]  March 13, 2009 11:59 AM  Follow-up for Phone Call        Pt. c/o BRB on tissue this morning, has seen blood occ. for the last 2 weeks, but this morning was the worse episode. Pt. also states she has rectal pain with BM's, "It's too big" Wants to be seen ASAP.  1) Anusol HC supp. 1 pr qhs x 7-10 nights,sitz bath TID,Tucks pads to rectum TID,use baby wipes instead of toilet paper. 2) Use Colace as needed 3) See Dr.Kalle Bernath on 03-15-09 at 11:30am 4) Pt. instructed to call back as needed.  Follow-up by: Laureen Ochs LPN,  March 13, 2009 1:50 PM    Prescriptions: ANUSOL-HC 25 MG SUPP (HYDROCORTISONE ACETATE) 1! supp in rectum at night for10  days  #10 x 1   Entered by:   Laureen Ochs LPN   Authorized by:   Hart Carwin MD   Signed by:   Laureen Ochs LPN on 65/78/4696   Method used:   Electronically to        [facility]. [phone]* (retail)       [address]       [place], [place]  [address]       Ph: [phone]       Fax: [phone]   RxID:   [id number]

## 2010-05-24 NOTE — Progress Notes (Signed)
Summary: Zegrid samples  Phone Note Call from Patient Call back at [facility] Phone [phone]   Call For: Dr Juanda Chance Summary of Call: Would like samples of Zegrid. Initial call taken by: Leanor Kail [facility]),  May 05, 2009 3:26 PM  Follow-up for Phone Call        Samples of Dexilant left out front for pt to take for one week until she gets paid again and then she will resume back to Zegerid.  Follow-up by: Christie Nottingham CMA Duncan Dull),  May 05, 2009 3:39 PM

## 2010-05-24 NOTE — Progress Notes (Signed)
Summary: triage  Phone Note Call from Patient Call back at Home Phone [phone]   Caller: Patient Call For: Dr Juanda Chance Reason for Call: Talk to Nurse Summary of Call: Patient would like to be seen sooner than first available appt 2-29 for severe abd pain and gas. Initial call taken by: Tawni Levy,  May 15, 2010 3:05 PM  Follow-up for Phone Call        Spoke with patient. For the last 2 weeks, she has had diarrhea 2-4 times/day, Stomach pain above and below the belly button all across her stomach. She has noticed a "knot on her right side." her diarrhea is watery. She is not sure if she has any blood in the stool. Denies fever. Taking Align, tried Aciphex but it did not help so she is going to pick up Zegerid today and start back on it. She is not taking her Bentyl.Hx IBS, reflux. Last colon- 02/2008 was negative. She wants to be seen. Scheduled her on 05/18/10 at 3 PM with Dr. Juanda Chance. Please, advise for other suggestions. Follow-up by: Jesse Fall RN,  May 15, 2010 5:03 PM  Additional Follow-up for Phone Call Additional follow up Details #1::        I will see her on Friday 05/18/2010 Additional Follow-up by: Hart Carwin MD,  May 15, 2010 5:10 PM

## 2010-05-24 NOTE — Progress Notes (Signed)
Summary: needs appt  Phone Note Outgoing Call   Summary of Call: Called to discuss with the patient chest pain.  She has seen Dr. Charlies Constable in the distant past.  I do not have these notes.  She was in the ER on Sat with chest pain.  I cannot find any notes in echart.  She is no longer having the pain.  However, it was suggested that she have follow up in our office.   Initial call taken by: Rollene Rotunda, MD, [facility],  March 13, 2009 2:08 PM  Follow-up for Phone Call        Appt was made for 11/23.  Patient cancelled.

## 2010-05-24 NOTE — Assessment & Plan Note (Signed)
Summary: hemorroids/rb   History of Present Illness Visit Type: follow up Primary GI MD: [name] MD Primary Provider: n/a Chief Complaint: Nausea, generalized abd pain, excessive gas, BRB when pt wipes x 3 weeks ago. Pt has hemorrhoids and IBS. History of Present Illness:   This is a 45 year old African American female with  intermittent rectal bleeding, bloating, abdominal distention and dyspepsia. She denies dysphagia or odynophagia. Patient takes Zegerid 40 mg daily. She has chronic constipation for which she is taking MiraLax 17 g daily. The blood is visible on toilet tissue and is associated with rectal fullness and irritation.   GI Review of Systems    Reports abdominal pain and  bloating.     Location of  Abdominal pain: generalized.     Reports change in bowel habits, constipation, hemorrhoids, irritable bowel syndrome, rectal bleeding, and  rectal pain.     Denies anal fissure, black tarry stools, diarrhea, diverticulosis, fecal incontinence, heme positive stool, jaundice, light color stool, and  liver problems.     Prior Medications Reviewed Using: Patient Recall  Updated Prior Medication List: ANUSOL-HC 25 MG SUPP (HYDROCORTISONE ACETATE) 1! supp in rectum at night for10  days ORTHO-CYCLEN (28) 0.25-35 MG-MCG TABS (NORGESTIMATE-ETH ESTRADIOL) one tablet by mouth once daily BENICAR 20 MG TABS (OLMESARTAN MEDOXOMIL) one tablet by mouth once daily DYAZIDE 37.5-25 MG CAPS (TRIAMTERENE-HCTZ) one tablet by mouth once daily ZEGERID 40-1100 MG CAPS (OMEPRAZOLE-SODIUM BICARBONATE) one tablet by mouth once daily LOVAZA 1 GM CAPS (OMEGA-3-ACID ETHYL ESTERS) one tablet by mouth two times a day ZYRTEC ALLERGY 10 MG TABS (CETIRIZINE HCL) one tablet by mouth once daily ALLERX-D 120-2.5 MG XR12H-TAB (PSEUDOEPHEDRINE-METHSCOPOLAMIN) one tablet by mouth two times a day POLYETHYLENE GLYCOL 3350  POWD (POLYETHYLENE GLYCOL 3350) one scoop with liquid every night   Current Allergies (reviewed today): ! ACE INHIBITORS  Past Medical History:    Reviewed history from 03/03/2008 and no changes required:       Current Problems:        RECTAL BLEEDING (ICD-569.3)       HYPERLIPIDEMIA (ICD-272.4)       HYPERTENSION (ICD-401.9)       GERD (ICD-530.81)       IRRITABLE BOWEL SYNDROME (ICD-564.1)         Past Surgical History:    Reviewed history from 03/03/2008 and no changes required:       cholecystectomy   Family History:    Reviewed history from 03/03/2008 and no changes required:       No FH of Colon Cancer:       Family History of Prostate Cancer: Father       Family History of Diabetes: Mother, paternal grandmother       Family History of Heart Disease: mother  Social History:    Reviewed history from 03/03/2008 and no changes required:       Alcohol Use - no       Illicit Drug Use - no       Patient has never smoked.        Daily Caffeine Use   Risk Factors:  Tobacco use:  never   Review of Systems  The patient denies allergy/sinus, anemia, anxiety-new, arthritis/joint pain, back pain, blood in urine, breast changes/lumps, change in vision, confusion, cough, coughing up blood, depression-new, fainting, fatigue, fever, headaches-new, hearing problems, heart murmur, heart rhythm changes, itching, menstrual pain, muscle pains/cramps, night sweats, nosebleeds, pregnancy symptoms, shortness of breath, skin rash, sleeping problems, sore throat, swelling  of feet/legs, swollen lymph glands, thirst - excessive , urination - excessive , urination changes/pain, urine leakage, vision changes, and voice change.         Pertinent positive and negative review of systems were noted in the above HPI. All other ROS was otherwise negative.    Vital Signs:  Patient Profile:   45 Years Old Female Height:     70 inches Weight:      291.50 pounds BMI:     41.98 BSA:     2.45 Pulse rate:   100 / minute Pulse rhythm:   regular  BP sitting:   128 / 72  (left arm) Cuff size:   regular  Vitals Entered By: Christie Nottingham CMA (March 07, 2008 3:22 PM)                  Physical Exam  General:     alert, oriented, in no distress. Neck:     Supple; no masses or thyromegaly. Lungs:     Clear throughout to auscultation. Heart:     Regular rate and rhythm; no murmurs, rubs,  or bruits. Abdomen:     protuberant but soft, tenderness in epigastrium. Normoactive bowel sounds. Liver edge at costal margin. Rectal:     no perianal disease. rectal tone is normal. stool is hemoccult negative. no prolapse. Extremities:     No clubbing, cyanosis, edema or deformities noted. Neurologic:     Alert and  oriented x4;  grossly normal neurologically.    Impression & Recommendations:  Problem # 1:  RECTAL BLEEDING (ICD-569.3) intermittent hematochezia combined with constipation. We should rule out internal hemorrhoids, segmental colitis or polyps. Patient has no family history of colon cancer. We will go ahead and schedule her for colonoscopy. Patient will continue MiraLax 17 g daily. Orders: Colonoscopy (Colon)   Problem # 2:  GERD (ICD-530.81) dyspepsia status post remote cholecystectomy. Her symptoms are suggestive of either gastritis, functional dyspepsia or reflux esophagitis. She is to continue Zegerid 40 mg a day. We will proceed with a diagnostic upper endoscopy.   Patient Instructions: 1)  upper endoscopy 2)  Colonoscopy 3)  Anusol-HC suppositories q.h.s. 4)  Zegerid 40 mg p.o. q.d. 5)  Antireflux measures and weight loss 6)  MiraLax 17 g daily    Prescriptions: BENICAR 20 MG TABS (OLMESARTAN MEDOXOMIL) one tablet by mouth once daily  #30 x 1   Entered by:   Hortense Ramal CMA   Authorized by:   Hart Carwin MD   Signed by:   Hortense Ramal CMA on 03/07/2008   Method used:   Electronically to        [facility]. [phone]* (retail)       [address]       [place], [place]  [address]       Ph: [phone]       Fax: [phone]   RxID:   [id number] DULCOLAX 5 MG  TBEC (BISACODYL) Day before procedure take 2 at 3pm and 2 at 8pm.  #4 x 0   Entered by:   Hortense Ramal CMA   Authorized by:   Hart Carwin MD   Signed by:   Hortense Ramal CMA on 03/07/2008   Method used:   Electronically to        [facility] #[id number]* (retail)       [address]  [place], [place]  [address]       Ph: [phone]       Fax: [phone]   RxID:   [id number] REGLAN 10 MG  TABS (METOCLOPRAMIDE HCL) As per prep instructions.  #2 x 0   Entered by:   Hortense Ramal CMA   Authorized by:   Hart Carwin MD   Signed by:   Hortense Ramal CMA on 03/07/2008   Method used:   Electronically to        [facility]. [phone]* (retail)       [address]       [place], [place]  [address]       Ph: [phone]       Fax: [phone]   RxID:   [id number] MIRALAX   POWD (POLYETHYLENE GLYCOL 3350) As per prep  instructions.  #255gm x 0   Entered by:   Hortense Ramal CMA   Authorized by:   Hart Carwin MD   Signed by:   Hortense Ramal CMA on 03/07/2008   Method used:   Electronically to        [facility]. [phone]* (retail)       [address]       [place], [place]  [address]       Ph: [phone]       Fax: [phone]   RxID:   [id number]  ]

## 2010-05-24 NOTE — Progress Notes (Signed)
Summary: TRIAGE  Phone Note Call from Patient Call back at Home Phone [phone]   Caller: Patient Call For: Gavin Pound Reason for Call: Talk to Nurse Summary of Call: Having a colonoscopy next Wednesday....wants to get a back and chest x-ray....pls call to set one up Initial call taken by: Darden Palmer,  March 08, 2008 1:29 PM  Follow-up for Phone Call        DR.Kameran Lallier--Pt. c/o a spot in her upper back that began to hurt last night. She wants you to order her a back and chest x-ray. Please advise. Follow-up by: Laureen Ochs LPN,  March 08, 2008 1:44 PM  Additional Follow-up for Phone Call Additional follow up Details #1::        OK , please order PA&Lat cxr, and x-ray LS spine Additional Follow-up by: Hart Carwin MD,  March 08, 2008 9:38 PM      Appended Document: X-ray orders Message left for patient to callback.   Clinical Lists Changes  Problems: Added new problem of BACK PAIN (ICD-724.5) Orders: Added new Test order of GI Misc Procedure/ Radiology Order (GI Misc ) - Signed      Appended Document: TRIAGE pt. will p/u x-ray orders today or tomorrow and have x-ray done at [facility].

## 2010-05-24 NOTE — Letter (Signed)
Summary: Out of Work  [facility]  [address] [place], [place] [address]   Phone: [phone]  Fax: [phone]    03/07/2008  TO: WHOM IT MAY CONCERN  RE: Maria Sharp [address] [place]       The above named individual is currently under my care and will be out of work    FROM: 03/15/08   THROUGH:03/16/08    REASON:Procedure    MAY RETURN ON:03/17/08     If you have any further questions or need additional information, please call.     Sincerely,    Dr [name], Gastroenterology typed by: Hortense Ramal CMA

## 2010-05-24 NOTE — Letter (Signed)
Summary: [facility]  [facility]   Imported By: Kassie Mends 07/21/2009 08:55:46  _____________________________________________________________________  External Attachment:    Type:   Image     Comment:   External Document

## 2010-05-24 NOTE — Miscellaneous (Signed)
  Clinical Lists Changes  Observations: Added new observation of ECHOINTERP:  Left ventricle: The cavity size was normal. Wall thickness was     normal. Systolic function was normal. The estimated ejection     fraction was in the range of 55% to 60%. Wall motion was normal;     there were no regional wall motion abnormalities.    Transthoracic     echocardiography. (04/24/2009 14:12)      Echocardiogram  Procedure date:  04/24/2009  Findings:       Left ventricle: The cavity size was normal. Wall thickness was     normal. Systolic function was normal. The estimated ejection     fraction was in the range of 55% to 60%. Wall motion was normal;     there were no regional wall motion abnormalities.    Transthoracic     echocardiography.

## 2010-05-24 NOTE — Assessment & Plan Note (Signed)
Summary: rov@ 10:45/sl   Visit Type:  Follow-up Primary Provider:  Noel Journey, MD  CC:  palpitations.  History of Present Illness: The A. she presents for followup of palpitations. She was seen by our PA after an ER visit for palpitations. She had TSH which was within normal limits. She had an echocardiogram which was unremarkable. She continues to get occasional palpitations. She describes some episodes of a racing heart rate. She is not describing presyncope or syncope. These happen randomly. They have not increased in frequency or severity. She does occasionally get some sporadic chest discomfort as well. This seems to be mild and not associated with other symptoms. She does have multiple cardiovascular risk factors. She is scheduled to have some pain stripping and is also to undergo surgery for a torn meniscus.  Current Medications (verified): 1)  Benicar 20 Mg Tabs (Olmesartan Medoxomil) .... One Tablet By Mouth Once Daily 2)  Dyazide 37.5-25 Mg Caps (Triamterene-Hctz) .... One Tablet By Mouth Once Daily 3)  Zegerid 40-1100 Mg Caps (Omeprazole-Sodium Bicarbonate) .... One Tablet By Mouth Two Times A Day 4)  Lovaza 1 Gm Caps (Omega-3-Acid Ethyl Esters) .... One Tablet By Mouth Two Times A Day 5)  Zyrtec Allergy 10 Mg Tabs (Cetirizine Hcl) .... One Tablet By Mouth Once Daily 6)  Aspirin 81 Mg Tbec (Aspirin) .... Take One By Mouth Once Daily 7)  Januvia 100 Mg Tabs (Sitagliptin Phosphate) .... Take One By Mouth Once Daily 8)  Vitamin D (Ergocalciferol) 50000 Unit Caps (Ergocalciferol) .... Take One By Mouth Twice A Week 9)  Celebrex 200 Mg Caps (Celecoxib) .... Take One or Two By Mouth Once Daily 10)  Xyzal 5 Mg Tabs (Levocetirizine Dihydrochloride) .... Take One By Mouth Once Daily  Allergies (verified): 1)  ! Ace Inhibitors  Past History:  Past Medical History: Current Problems:  DIABETES MELLITUS (ICD-250.00) x 12 years BACK PAIN (ICD-724.5) RECTAL BLEEDING  (ICD-569.3) HYPERLIPIDEMIA (ICD-272.4) x 5 HYPERTENSION (ICD-401.9) x 25 years GERD (ICD-530.81) IRRITABLE BOWEL SYNDROME (ICD-564.1)  Past Surgical History: Cholecystectomy  Review of Systems       As stated in the HPI and negative for all other systems.   Vital Signs:  Patient profile:   45 year old female Height:      70 inches Weight:      285 pounds BMI:     41.04 Pulse rate:   84 / minute Resp:     16 per minute BP sitting:   118 / 88  (right arm)  Vitals Entered By: Marrion Coy, CNA (May 12, 2009 11:01 AM)  Physical Exam  General:  Well developed, well nourished, in no acute distress. Head:  normocephalic and atraumatic Eyes:  PERRLA/EOM intact; conjunctiva and lids normal. Mouth:  Teeth, gums and palate normal. Oral mucosa normal. Neck:  Neck supple, no JVD. No masses, thyromegaly or abnormal cervical nodes. Lungs:  Clear bilaterally to auscultation and percussion. Abdomen:  Bowel sounds positive; abdomen soft and non-tender without masses, organomegaly, or hernias noted. No hepatosplenomegaly. Msk:  Back normal, normal gait. Muscle strength and tone normal. Extremities:  No clubbing or cyanosis. Neurologic:  Alert and oriented x 3. Skin:  Intact without lesions or rashes. Cervical Nodes:  no significant adenopathy Axillary Nodes:  no significant adenopathy Inguinal Nodes:  no significant adenopathy Psych:  Normal affect.   Detailed Cardiovascular Exam  Neck    Carotids: Carotids full and equal bilaterally without bruits.      Neck Veins: Normal, no JVD.  Heart    Inspection: no deformities or lifts noted.      Palpation: normal PMI with no thrills palpable.      Auscultation: regular rate and rhythm, S1, S2 without murmurs, rubs, gallops, or clicks.    Vascular    Abdominal Aorta: no palpable masses, pulsations, or audible bruits.      Femoral Pulses: normal femoral pulses bilaterally.      Pedal Pulses: normal pedal pulses bilaterally.       Radial Pulses: normal radial pulses bilaterally.      Peripheral Circulation: no clubbing, cyanosis, or edema noted with normal capillary refill.     EKG  Procedure date:  05/12/2009  Findings:      sus rhythm, rate 83, axis within normal limits, intervals within normal limits, no acute ST-T wave changes.  Impression & Recommendations:  Problem # 1:  PALPITATIONS (ICD-785.1) These are not particularly symptomatic at this point. For now we will manage these expectantly. If she has any increasing symptoms she will let me know. 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 I discussed the [employer]. She requested a nutritional consult which I think is excellent. We also discussed exercise as a component to weight loss. Orders: Nutrition Referral (Nutrition)  Problem # 3:  HYPERTENSION (ICD-401.9) Her blood pressure is controlled and she will continue the meds as listed.  Problem # 4:  CHEST PAIN, INTERMITTENT (ICD-786.50) The patient has significant cardiovascular risk factors. I think screening with an exercise treadmill test is indicated. This will allow me to look for obstructive coronary disease for which she has a low pretest probability. He will allow me to perform a pre-operative evaluation. He will risk stratify and most importantly allow me to give her a prescription for exercise.  Patient Instructions: 1)  Your physician recommends that you schedule a follow-up appointment at time of Treadmill 2)  Your physician recommends that you continue on your current medications as directed. Please refer to the Current Medication list given to you today. 3)  You have been referred to a nutritioinist for weight loss. 4)  Your physician encouraged you to lose weight for better health.

## 2010-05-24 NOTE — Letter (Signed)
Summary: Diabetic Instructions  [facility]  [address] [place], [place] [address]   Phone: [phone]  Fax: [phone]    Maria Sharp 1965-05-23 MRN: [id number]   _x _    ORAL DIABETIC MEDICATION INSTRUCTIONS           Januvia The day before your procedure:   Take your diabetic pill as you do normally  The day of your procedure:   Do not take your diabetic pill    We will check your blood sugar levels during the admission process and again in Recovery before discharging you home  ________________________________________________________________________

## 2010-05-24 NOTE — Letter (Signed)
Summary: EGD Instructions  [facility]  [address] [place], [place] [address]   Phone: [phone]  Fax: [phone]       Maria Sharp    04-Apr-1966    MRN: [id number]       Procedure Day /Date: Friday 06/01/10     Arrival Time: 8:30 am     Procedure Time: 9:30 am    Location of Procedure:                     _ x _ [facility] ( Outpatient Registration)   PREPARATION FOR ENDOSCOPY   On 06/01/10 THE DAY OF THE PROCEDURE:  1.   No solid foods, milk or milk products are allowed after midnight the night before your procedure.  2.   Do not drink anything colored red or purple.  Avoid juices with pulp.  No orange juice.  3.  You may drink clear liquids until 5:30 am, which is 4 hours before your procedure.                                                                                                CLEAR LIQUIDS INCLUDE: Water Jello Ice Popsicles Tea (sugar ok, no milk/cream) Powdered fruit flavored drinks Coffee (sugar ok, no milk/cream) Gatorade Juice: apple, white grape, white cranberry  Lemonade Clear bullion, consomm, broth Carbonated beverages (any kind) Strained chicken noodle soup Hard Candy   MEDICATION INSTRUCTIONS  Unless otherwise instructed, you should take regular prescription medications with a small sip of water as early as possible the morning of your procedure.  Diabetic patients - see separate instructions.                   OTHER INSTRUCTIONS  You will need a responsible adult at least 45 years of age to accompany you and drive you home.   This person must remain in the waiting room during your procedure.  Wear loose fitting clothing that is easily removed.  Leave jewelry and other valuables at home.  However, you may wish to bring a book to read or an iPod/MP3 player to listen to music as you wait for your procedure to start.  Remove all body piercing jewelry and leave at home.  Total time from  sign-in until discharge is approximately 2-3 hours.  You should go home directly after your procedure and rest.  You can resume normal activities the day after your procedure.  The day of your procedure you should not:   Drive   Make legal decisions   Operate machinery   Drink alcohol   Return to work  You will receive specific instructions about eating, activities and medications before you leave.    The above instructions have been reviewed and explained to me by   Lamona Curl CMA Duncan Dull)  May 18, 2010 5:05 PM     I fully understand and can verbalize these instructions _____________________________ Date 05/18/10

## 2010-05-24 NOTE — Letter (Signed)
Summary: CMA Hemoccult Letter  [facility]  [address] [place], [place] [address]   Phone: [phone]  Fax: [phone]         May 26, 2009 MRN: [id number]    Maria Sharp [address] [place], [place]  [address]    Dear [name],     Despite repeated attempts, I have been unsuccessful in recieving your hemoccult cards. Dr.Deysy Schabel Juanda Chance requested that you complete the hemoccult cards given to you at your last visit. However, as of yet, we have not received them. Please follow the instructions on the inside cover and return them as soon as possible.If you have misplaced the hemoccult cards, please call me at [phone] and I will mail you new cards. Your health is very important to [place].These tests will help ensure that Dr.Lindamarie Maclachlan has all the information at her disposal to make a complete diagnosis for you.  Thank you for your prompt attention to this matter.   Sincerely,    Hortense Ramal CMA (AAMA)  Appended Document: CMA Hemoccult Letter Letter mailed to patient....  Previous Attempts to reach patient:  Left message to send in cards. Hortense Ramal CMA Duncan Dull)  May 16, 2009 11:13 AM   asked pt to send in cards. Hortense Ramal CMA Duncan Dull)  April 07, 2009 10:31 AM   asked pt to send in cards. She states she will do so. Hortense Ramal CMA Duncan Dull)  May 01, 2009 1:44 PM

## 2010-05-28 ENCOUNTER — Encounter: Payer: Self-pay | Admitting: Internal Medicine

## 2010-05-28 ENCOUNTER — Ambulatory Visit (HOSPITAL_COMMUNITY)
Admission: RE | Admit: 2010-05-28 | Discharge: 2010-05-28 | Disposition: A | Discharge status: Home / Self Care | Payer: No Typology Code available for payment source | Source: Ambulatory Visit | Attending: Internal Medicine | Admitting: Internal Medicine

## 2010-05-28 DIAGNOSIS — R109 Unspecified abdominal pain: Secondary | ICD-10-CM | POA: Insufficient documentation

## 2010-05-30 ENCOUNTER — Telehealth: Payer: Self-pay | Admitting: Internal Medicine

## 2010-05-30 NOTE — Procedures (Signed)
Summary: Instructions for procedure/[place]  Instructions for procedure/[place]   Imported By: Sherian Rein 05/24/2010 14:39:06  _____________________________________________________________________  External Attachment:    Type:   Image     Comment:   External Document

## 2010-05-30 NOTE — Assessment & Plan Note (Signed)
Summary: abd pain, diarrhea/Regina   History of Present Illness Primary GI MD: [name] MD Primary Provider: Noel Journey, MD Chief Complaint: Last 2 weeks pt has RUQ abd pain that radiates to back and epigastric area. Pt states during that time she had change in bowels with diarrhea and gas, bloating.  History of Present Illness:   This is a 45 year old African American female with epigastric pain and left upper quadrant discomfort radiating to her back. She is status post cholecystectomy for cholelithiasis in 2002. A CT scan of the abdomen in 2006 and in July 2010 showed a retroflexed uterus and normal bile duct. She has a history of irritable bowel syndrome and gastroesophageal reflux. She works third shift from 8 PM to 6 AM and does not get enough sleep during the day. She is chronically tired. She used to take AcipHex 20 mg daily which helped but it is not covered by her insurance. She did have a normal colonoscopy in November 2009.   GI Review of Systems    Reports abdominal pain, belching, and  bloating.     Location of  Abdominal pain: RUQ and epigastric pain.    Denies acid reflux, chest pain, dysphagia with liquids, dysphagia with solids, heartburn, loss of appetite, nausea, vomiting, vomiting blood, weight loss, and  weight gain.      Reports change in bowel habits and  diarrhea.     Denies anal fissure, black tarry stools, constipation, diverticulosis, fecal incontinence, heme positive stool, hemorrhoids, irritable bowel syndrome, jaundice, light color stool, liver problems, rectal bleeding, and  rectal pain.    Current Medications (verified): 1)  Benicar 20 Mg Tabs (Olmesartan Medoxomil) .... One Tablet By Mouth Once Daily 2)  Dyazide 37.5-25 Mg Caps (Triamterene-Hctz) .... One Tablet By Mouth Once Daily 3)  Lovastatin 40 Mg Tabs (Lovastatin) .... Once Daily 4)  Zyrtec Allergy 10 Mg Tabs (Cetirizine Hcl) .... One Tablet By Mouth Once Daily 5)  Januvia 100 Mg Tabs (Sitagliptin  Phosphate) .... Take One By Mouth Once Daily 6)  Vitamin D (Ergocalciferol) 50000 Unit Caps (Ergocalciferol) .... Take One By Mouth Twice A Week 7)  Xyzal 5 Mg Tabs (Levocetirizine Dihydrochloride) .... Take One By Mouth Once Daily  Allergies (verified): 1)  ! Ace Inhibitors  Past History:  Past Medical History: Reviewed history from 05/12/2009 and no changes required. Current Problems:  DIABETES MELLITUS (ICD-250.00) x 12 years BACK PAIN (ICD-724.5) RECTAL BLEEDING (ICD-569.3) HYPERLIPIDEMIA (ICD-272.4) x 5 HYPERTENSION (ICD-401.9) x 25 years GERD (ICD-530.81) IRRITABLE BOWEL SYNDROME (ICD-564.1)  Past Surgical History: Reviewed history from 05/12/2009 and no changes required. Cholecystectomy  Family History: Reviewed history from 03/15/2009 and no changes required. No FH of Colon Cancer Family History of Prostate Cancer: Father Family History of Diabetes: Mother, paternal grandmother, father Family History of Heart Disease: mother  Social History: Reviewed history from 01/16/2010 and no changes required. Alcohol Use - no Illicit Drug Use - no Patient has never smoked.  Daily Caffeine Use 2cups tea/day Occupation: Paramedic  Review of Systems  The patient denies allergy/sinus, anemia, anxiety-new, arthritis/joint pain, back pain, blood in urine, breast changes/lumps, change in vision, confusion, cough, coughing up blood, depression-new, fainting, fatigue, fever, headaches-new, hearing problems, heart murmur, heart rhythm changes, itching, menstrual pain, muscle pains/cramps, night sweats, nosebleeds, pregnancy symptoms, shortness of breath, skin rash, sleeping problems, sore throat, swelling of feet/legs, swollen lymph glands, thirst - excessive , urination - excessive , urination changes/pain, urine leakage, vision changes, and voice change.  Pertinent positive and negative review of systems were noted in the above HPI. All other ROS was otherwise negative.    Vital Signs:  Patient profile:   45 year old female Height:      70 inches Weight:      289.38 pounds BMI:     41.67 Pulse rate:   70 / minute Pulse rhythm:   regular BP sitting:   124 / 72  (left arm) Cuff size:   large  Vitals Entered By: Christie Nottingham CMA Duncan Dull) (May 18, 2010 3:53 PM)  Physical Exam  General:  alert, oriented and in no distress. Overweight. Eyes:  nonicteric. Mouth:  No deformity or lesions, dentition normal. Neck:  Supple; no masses or thyromegaly. Lungs:  Clear throughout to auscultation. Heart:  Regular rate and rhythm; no murmurs, rubs,  or bruits. Abdomen:  obese abdomen, soft. Diffusely tender mostly across the upper abdomen and right upper quadrant as well as in the periumbilical area. Lower abdomen is unremarkable. Bowel sounds are normoactive. There is a postcholecystectomy periumbilical scar. No CVA tenderness. Mild tenderness in right costal vertebral area, suspect musculoskeletal origin. Rectal:  not done due to patient's period. Extremities:  No clubbing, cyanosis, edema or deformities noted. Skin:  Intact without significant lesions or rashes. Psych:  Alert and cooperative. Normal mood and affect.   Impression & Recommendations:  Problem # 1:  IRRITABLE BOWEL SYNDROME (ICD-564.1) Patient has epigastric and right upper quadrant abdominal pain most likely related to esophageal reflux. Patient has been off her acid reducers. I give her samples of Dexilant 60 mg for 10 days and will call her prescription for Prilosec 40 mg daily. We will obtain an upper abdominal ultrasound of right upper quadrant to rule out common bile duct stone.  Problem # 2:  OBESITY-MORBID (>100') (ICD-278.01) Assessment: Comment Only failure to lose weight.  Other Orders: ZEGD (ZEGD) Ultrasound Abdomen (UAS)  Patient Instructions: 1)  You have been scheduled for an endoscopy. Please follow written prep instructions that were given to you today at your visit.   2)  You have been scheduled for an abdominal ultrasound on 05/28/10 @ 11 am, [facility]. 3)  Please pick up your prescriptions at the pharmacy. Electronic prescription(s) has already been sent for Prilosec 40 mg 1 tablet by mouth once daily  4)  The medication list was reviewed and reconciled.  All changed / newly prescribed medications were explained.  A complete medication list was provided to the patient / caregiver. Prescriptions: OMEPRAZOLE 20 MG CPDR (OMEPRAZOLE) Take 1 tablet by mouth once a day  #30 x 2   Entered by:   Lamona Curl CMA (AAMA)   Authorized by:   Hart Carwin MD   Signed by:   Lamona Curl CMA (AAMA) on 05/18/2010   Method used:   Electronically to        [facility]. [phone]* (retail)       [address]       [place], [place]  [address]       Ph: [phone]       Fax: [phone]   RxID:   [id number]

## 2010-05-31 ENCOUNTER — Encounter: Payer: Self-pay | Admitting: Internal Medicine

## 2010-06-01 ENCOUNTER — Encounter: Payer: No Typology Code available for payment source | Admitting: Internal Medicine

## 2010-06-01 ENCOUNTER — Other Ambulatory Visit: Payer: Self-pay | Admitting: Internal Medicine

## 2010-06-01 ENCOUNTER — Ambulatory Visit (HOSPITAL_COMMUNITY)
Admission: RE | Admit: 2010-06-01 | Discharge: 2010-06-01 | Disposition: A | Discharge status: Home / Self Care | Payer: No Typology Code available for payment source | Source: Ambulatory Visit | Attending: Internal Medicine | Admitting: Internal Medicine

## 2010-06-01 DIAGNOSIS — J45909 Unspecified asthma, uncomplicated: Secondary | ICD-10-CM | POA: Insufficient documentation

## 2010-06-01 DIAGNOSIS — R109 Unspecified abdominal pain: Secondary | ICD-10-CM | POA: Insufficient documentation

## 2010-06-01 DIAGNOSIS — E119 Type 2 diabetes mellitus without complications: Secondary | ICD-10-CM | POA: Insufficient documentation

## 2010-06-01 DIAGNOSIS — K219 Gastro-esophageal reflux disease without esophagitis: Secondary | ICD-10-CM | POA: Insufficient documentation

## 2010-06-01 DIAGNOSIS — M129 Arthropathy, unspecified: Secondary | ICD-10-CM | POA: Insufficient documentation

## 2010-06-01 DIAGNOSIS — I10 Essential (primary) hypertension: Secondary | ICD-10-CM | POA: Insufficient documentation

## 2010-06-01 DIAGNOSIS — K2940 Chronic atrophic gastritis without bleeding: Secondary | ICD-10-CM | POA: Insufficient documentation

## 2010-06-01 LAB — GLUCOSE, CAPILLARY: Glucose-Capillary: 90 mg/dL (ref 70–99)

## 2010-06-04 ENCOUNTER — Encounter: Payer: Self-pay | Admitting: Internal Medicine

## 2010-06-07 NOTE — Procedures (Addendum)
Summary: Upper Endoscopy  Patient: [name] Note: All result statuses are Final unless otherwise noted.  Tests: (1) Upper Endoscopy (EGD)   EGD Upper Endoscopy       DONE     [facility]     [address] [place], [place]  [address]           ENDOSCOPY PROCEDURE REPORT           PATIENT:  Maria Sharp, Maria Sharp  MR#:  [id number]     BIRTHDATE:  February 19, 1966, 44 yrs. old  GENDER:  female           ENDOSCOPIST:  Hedwig Morton. Juanda Chance, MD     Referred by:  Ace Gins, M.D.           PROCEDURE DATE:  06/01/2010     PROCEDURE:  EGD with biopsy, [address]     ASA CLASS:  Class II     INDICATIONS:  abdominal pain epig and LUQ abd. pain,     s/p remote chole, normal abd. sono     improved on Dexilant     normal colon 2009           MEDICATIONS:   There was residual sedation effect present from     prior procedure. 7 mg, Fentanyl 75 mcg     TOPICAL ANESTHETIC:  Cetacaine Spray           DESCRIPTION OF PROCEDURE:   After the risks and benefits of the     procedure were explained, informed consent was obtained.  The     EG-[id number] ([id number]) endoscope was introduced through the mouth and     advanced to the second portion of the duodenum.  The instrument     was slowly withdrawn as the mucosa was fully examined.     <<PROCEDUREIMAGES>>           The upper, middle, and distal third of the esophagus were     carefully inspected and no abnormalities were noted. The z-line     was well seen at the GEJ. The endoscope was pushed into the fundus     which was normal including a retroflexed view. The antrum,gastric     body, first and second part of the duodenum were unremarkable.     With standard forceps, a biopsy was obtained and sent to pathology.     r/o H (see image1, image2, image3, image4, image5, and     image6).Pylori    Retroflexed views revealed no abnormalities.     The scope was then withdrawn from the patient and the procedure     completed.           COMPLICATIONS:   None           ENDOSCOPIC IMPRESSION:     1) Normal EGD     s/p gastric biopsies for H.Pylori     RECOMMENDATIONS:     1) Await biopsy results     continue Dexilant 60 mg qd           ______________________________     Hedwig Morton. Juanda Chance, MD           CC:           n.     eSIGNED:   Hedwig Morton. Malik Ruffino at 06/01/2010 10:54 AM           Ellen Henri, [id number]  Note: An exclamation mark (!) indicates a result that was  not dispersed into the flowsheet. Document Creation Date: 06/01/2010 10:54 AM _______________________________________________________________________  (1) Order result status: Final Collection or observation date-time: 06/01/2010 10:47 Requested date-time:  Receipt date-time:  Reported date-time:  Referring Physician:   Ordering Physician: [name] [id number]) Specimen Source:  Source: Launa Grill Order Number: [id number] Lab site:

## 2010-06-07 NOTE — Progress Notes (Signed)
Summary: Note for work  Phone Note Call from Patient Call back at [employer] [phone]   Caller: Patient Call For: Dr. Juanda Chance Reason for Call: Talk to Nurse Summary of Call: Pt wants note out of work for thursday and Friday Initial call taken by: [place] Johnson,  May 30, 2010 12:53 PM  Follow-up for Phone Call        Patient is having hospital endoscopy on 06/01/10. She states that she works night shift and wonders if we can give her a note to be out of work Thursday night in preparation for her endosocpy @ 8:30 am Friday morning. Advised patient that I am unsure if we can give her a note for Thursday night although we can definately give her a note for Friday because she will still be sedated and unable to work at that time. Dr Juanda Chance, would you like for me to provide a work note for Thursday and Friday or just for Friday? Follow-up by: Lamona Curl CMA (AAMA),  May 30, 2010 1:10 PM  Additional Follow-up for Phone Call Additional follow up Details #1::        OK,to give her a note. Additional Follow-up by: Hart Carwin MD,  May 30, 2010 9:26 PM    Additional Follow-up for Phone Call Additional follow up Details #2::    note written for patient. Follow-up by: Lamona Curl CMA (AAMA),  May 31, 2010 8:08 AM

## 2010-06-07 NOTE — Letter (Signed)
Summary: Work [facility]  [address] [place], [place] [address]   Phone: [phone]  Fax: [phone]                  Today's Date: May 31, 2010  Name of Patient: Maria Sharp  The above named patient had a medical visit today. Please take this into consideration when reviewing the time away from work/school.    Special Instructions:  [ x ] Other-Please excuse patient from work on Thursday 05-31-10 and Friday 06-01-10.   Sincerely yours,     Hedwig Morton. Juanda Chance, MD

## 2010-06-13 NOTE — Letter (Signed)
Summary: Patient [facility] Biopsy Results  [facility]  [address] [place], [place] [address]   Phone: [phone]  Fax: [phone]        June 04, 2010 MRN: [id number]    Maria Sharp [address] [place], [place]  [address]    Dear [name],  I am pleased to inform you that the biopsies taken during your recent endoscopic examination did not show any evidence of cancer upon pathologic examination.The biopsies show mild gastritis  Additional information/recommendations:  __No further action is needed at this time.  Please follow-up with      your primary care physician for your other healthcare needs.  __ Please call [phone] to schedule a return visit to review      your condition.  _x_ Continue with the treatment plan as outlined on the day of your      exam.     Please call us if you are having persistent problems or have questions about your condition that have not been fully answered at this time.  Sincerely,  Hart Carwin MD  This letter has been electronically signed by your physician.  Appended Document: Patient Notice-Endo Biopsy Results Letter mailed to patient.

## 2010-06-14 ENCOUNTER — Telehealth: Payer: Self-pay | Admitting: Internal Medicine

## 2010-06-19 NOTE — Progress Notes (Signed)
Summary: samples  Phone Note Call from Patient Call back at Home Phone [phone] P PH     Caller: Patient Call For: Dr Juanda Chance Reason for Call: Talk to Nurse Summary of Call: Patient would like some Dexilant samples Initial call taken by: Tawni Levy,  June 14, 2010 4:11 PM  Follow-up for Phone Call        Patient low on funds this week and request Dexilant samples. She never picked up prescription for omeprazole at pharmacy (see last office note 05/18/10). Patient advised to pick up prescription of omeprazole after this week. Dexilant at front desk. Samples Given #  2 boxes -Lot Number:  [id number] exp 10/13 Follow-up by: Lamona Curl CMA (AAMA),  June 14, 2010 4:58 PM    New/Updated Medications: OMEPRAZOLE 40 MG CPDR (OMEPRAZOLE) Take 1 tablet by mouth once a day (PHARMACY-PLEASE D/C RC FOR OMEPRAZOLE 20 MG) Prescriptions: OMEPRAZOLE 40 MG CPDR (OMEPRAZOLE) Take 1 tablet by mouth once a day (PHARMACY-PLEASE D/C RC FOR OMEPRAZOLE 20 MG)  #30 x 4   Entered by:   Lamona Curl CMA (AAMA)   Authorized by:   Hart Carwin MD   Signed by:   Lamona Curl CMA (AAMA) on 06/14/2010   Method used:   Electronically to        [facility]. [phone]* (retail)       [address]       [place], [place]  [address]       Ph: [phone]       Fax: [phone]   RxID:   [id number]

## 2010-07-25 LAB — TROPONIN I
Troponin I: 0.01 ng/mL (ref 0.00–0.06)
Troponin I: 0.01 ng/mL (ref 0.00–0.06)

## 2010-07-25 LAB — BASIC METABOLIC PANEL
BUN: 12 mg/dL (ref 6–23)
CO2: 30 mEq/L (ref 19–32)
Calcium: 9 mg/dL (ref 8.4–10.5)
Chloride: 101 mEq/L (ref 96–112)
Creatinine, Ser: 0.69 mg/dL (ref 0.4–1.2)
GFR calc Af Amer: >60 mL/min (ref >60)
GFR calc non Af Amer: >60 mL/min (ref >60)
Glucose, Bld: 115 mg/dL — ABNORMAL HIGH (ref 70–99)
Potassium: 3.7 mEq/L (ref 3.5–5.1)
Sodium: 137 mEq/L (ref 135–145)

## 2010-07-25 LAB — POCT CARDIAC MARKERS
CKMB, poc: 1.5 ng/mL (ref 1.0–8.0)
CKMB, poc: 2.3 ng/mL (ref 1.0–8.0)
CKMB, poc: <1 ng/mL — ABNORMAL LOW (ref 1.0–8.0)
Myoglobin, poc: 413 ng/mL (ref 12–200)
Myoglobin, poc: 87.7 ng/mL (ref 12–200)
Myoglobin, poc: 97 ng/mL (ref 12–200)
Troponin i, poc: <0.05 ng/mL (ref 0.00–0.09)
Troponin i, poc: <0.05 ng/mL (ref 0.00–0.09)
Troponin i, poc: <0.05 ng/mL (ref 0.00–0.09)

## 2010-07-25 LAB — CK TOTAL AND CKMB (NOT AT ARMC)
CK, MB: 1.2 ng/mL (ref 0.3–4.0)
CK, MB: 1.4 ng/mL (ref 0.3–4.0)
Relative Index: 1.1 (ref 0.0–2.5)
Relative Index: 1.1 (ref 0.0–2.5)
Total CK: 112 U/L (ref 7–177)
Total CK: 124 U/L (ref 7–177)

## 2010-07-25 LAB — URINALYSIS, ROUTINE W REFLEX MICROSCOPIC
Bilirubin Urine: NEGATIVE
Glucose, UA: NEGATIVE mg/dL
Hgb urine dipstick: NEGATIVE
Ketones, ur: NEGATIVE mg/dL
Nitrite: NEGATIVE
Protein, ur: NEGATIVE mg/dL
Specific Gravity, Urine: 1.021 (ref 1.005–1.030)
Urobilinogen, UA: 0.2 mg/dL (ref 0.0–1.0)
pH: 7 (ref 5.0–8.0)

## 2010-07-25 LAB — CBC
HCT: 36.8 % (ref 36.0–46.0)
Hemoglobin: 12.3 g/dL (ref 12.0–15.0)
MCHC: 33.4 g/dL (ref 30.0–36.0)
MCV: 82.1 fL (ref 78.0–100.0)
Platelets: 316 10*3/uL (ref 150–400)
RBC: 4.49 MIL/uL (ref 3.87–5.11)
RDW: 16.2 % — ABNORMAL HIGH (ref 11.5–15.5)
WBC: 8.6 10*3/uL (ref 4.0–10.5)

## 2010-07-25 LAB — DIFFERENTIAL
Basophils Absolute: 0 10*3/uL (ref 0.0–0.1)
Basophils Relative: 0 % (ref 0–1)
Eosinophils Absolute: 0.1 10*3/uL (ref 0.0–0.7)
Eosinophils Relative: 1 % (ref 0–5)
Lymphocytes Relative: 22 % (ref 12–46)
Lymphs Abs: 1.9 10*3/uL (ref 0.7–4.0)
Monocytes Absolute: 0.8 10*3/uL (ref 0.1–1.0)
Monocytes Relative: 9 % (ref 3–12)
Neutro Abs: 5.8 10*3/uL (ref 1.7–7.7)
Neutrophils Relative %: 68 % (ref 43–77)

## 2010-07-25 LAB — GLUCOSE, CAPILLARY: Glucose-Capillary: 102 mg/dL — ABNORMAL HIGH (ref 70–99)

## 2010-10-06 LAB — HM MAMMOGRAPHY: HM Mammogram: NORMAL

## 2010-10-31 ENCOUNTER — Other Ambulatory Visit: Payer: Self-pay | Admitting: Obstetrics and Gynecology

## 2011-01-19 ENCOUNTER — Encounter: Payer: Self-pay | Admitting: *Deleted

## 2011-01-19 ENCOUNTER — Emergency Department (HOSPITAL_COMMUNITY)
Admission: EM | Admit: 2011-01-19 | Discharge: 2011-01-20 | Disposition: A | Discharge status: Home / Self Care | Payer: No Typology Code available for payment source | Attending: Emergency Medicine | Admitting: Emergency Medicine

## 2011-01-19 DIAGNOSIS — E7800 Pure hypercholesterolemia, unspecified: Secondary | ICD-10-CM | POA: Insufficient documentation

## 2011-01-19 DIAGNOSIS — E119 Type 2 diabetes mellitus without complications: Secondary | ICD-10-CM | POA: Insufficient documentation

## 2011-01-19 DIAGNOSIS — R51 Headache: Secondary | ICD-10-CM | POA: Insufficient documentation

## 2011-01-19 DIAGNOSIS — I10 Essential (primary) hypertension: Secondary | ICD-10-CM | POA: Insufficient documentation

## 2011-01-19 DIAGNOSIS — S161XXA Strain of muscle, fascia and tendon at neck level, initial encounter: Secondary | ICD-10-CM

## 2011-01-19 DIAGNOSIS — IMO0002 Reserved for concepts with insufficient information to code with codable children: Secondary | ICD-10-CM

## 2011-01-19 DIAGNOSIS — S239XXA Sprain of unspecified parts of thorax, initial encounter: Secondary | ICD-10-CM | POA: Insufficient documentation

## 2011-01-19 DIAGNOSIS — S139XXA Sprain of joints and ligaments of unspecified parts of neck, initial encounter: Secondary | ICD-10-CM | POA: Insufficient documentation

## 2011-01-19 DIAGNOSIS — S40019A Contusion of unspecified shoulder, initial encounter: Secondary | ICD-10-CM | POA: Insufficient documentation

## 2011-01-19 DIAGNOSIS — Z79899 Other long term (current) drug therapy: Secondary | ICD-10-CM | POA: Insufficient documentation

## 2011-01-19 DIAGNOSIS — G44209 Tension-type headache, unspecified, not intractable: Secondary | ICD-10-CM

## 2011-01-19 DIAGNOSIS — K589 Irritable bowel syndrome without diarrhea: Secondary | ICD-10-CM | POA: Insufficient documentation

## 2011-01-19 HISTORY — DX: Essential (primary) hypertension: I10

## 2011-01-19 HISTORY — DX: Pure hypercholesterolemia, unspecified: E78.00

## 2011-01-19 HISTORY — DX: Irritable bowel syndrome, unspecified: K58.9

## 2011-01-19 MED ORDER — IBUPROFEN 800 MG PO TABS
800.0000 mg | ORAL_TABLET | Freq: Once | ORAL | Status: AC
  Administered 2011-01-19: 800 mg via ORAL
  Filled 2011-01-19: qty 1
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
## 2011-01-20 ENCOUNTER — Emergency Department (HOSPITAL_COMMUNITY): Payer: No Typology Code available for payment source

## 2011-01-20 LAB — POCT PREGNANCY, URINE: Preg Test, Ur: NEGATIVE

## 2011-01-20 MED ORDER — HYDROCODONE-ACETAMINOPHEN 5-325 MG PO TABS: ORAL_TABLET | ORAL | Status: DC

## 2011-01-20 MED ORDER — HYDROCODONE-ACETAMINOPHEN 5-325 MG PO TABS
1.0000 | ORAL_TABLET | Freq: Once | ORAL | Status: AC
  Administered 2011-01-20: 1 via ORAL
  Filled 2011-01-20: qty 1
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
## 2011-01-20 NOTE — ED Notes (Signed)
Transported to xray 
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
## 2011-01-23 LAB — GLUCOSE, CAPILLARY
Glucose-Capillary: 111 — ABNORMAL HIGH
Glucose-Capillary: 111 — ABNORMAL HIGH

## 2011-02-01 LAB — CBC
HCT: 35.6 — ABNORMAL LOW
Hemoglobin: 11.8 — ABNORMAL LOW
MCHC: 33.2
MCV: 80.1
Platelets: 330
RBC: 4.44
RDW: 16.2 — ABNORMAL HIGH
WBC: 8.9

## 2011-02-01 LAB — BASIC METABOLIC PANEL
BUN: 11
CO2: 31
Calcium: 8.8
Chloride: 103
Creatinine, Ser: 0.92
GFR calc Af Amer: >60
GFR calc non Af Amer: >60
Glucose, Bld: 107 — ABNORMAL HIGH
Potassium: 3.6
Sodium: 138

## 2011-02-01 LAB — DIFFERENTIAL
Basophils Absolute: 0.1
Basophils Relative: 1
Eosinophils Absolute: 0.2
Eosinophils Relative: 2
Lymphocytes Relative: 32
Lymphs Abs: 2.8
Monocytes Absolute: 1 — ABNORMAL HIGH
Monocytes Relative: 11
Neutro Abs: 4.8
Neutrophils Relative %: 54

## 2011-02-01 LAB — POCT CARDIAC MARKERS
CKMB, poc: 1.1
Myoglobin, poc: 58.4
Operator id: 166561
Troponin i, poc: <0.05

## 2011-05-06 ENCOUNTER — Encounter: Payer: Self-pay | Admitting: *Deleted

## 2011-05-15 ENCOUNTER — Ambulatory Visit: Payer: No Typology Code available for payment source | Admitting: Internal Medicine

## 2011-05-25 ENCOUNTER — Emergency Department (HOSPITAL_COMMUNITY)
Admission: EM | Admit: 2011-05-25 | Discharge: 2011-05-26 | Disposition: A | Discharge status: Home / Self Care | Payer: No Typology Code available for payment source | Attending: Emergency Medicine | Admitting: Emergency Medicine

## 2011-05-25 ENCOUNTER — Other Ambulatory Visit: Payer: Self-pay

## 2011-05-25 ENCOUNTER — Emergency Department (HOSPITAL_COMMUNITY): Payer: No Typology Code available for payment source

## 2011-05-25 ENCOUNTER — Encounter (HOSPITAL_COMMUNITY): Payer: Self-pay | Admitting: Emergency Medicine

## 2011-05-25 DIAGNOSIS — Z79899 Other long term (current) drug therapy: Secondary | ICD-10-CM | POA: Insufficient documentation

## 2011-05-25 DIAGNOSIS — I10 Essential (primary) hypertension: Secondary | ICD-10-CM | POA: Insufficient documentation

## 2011-05-25 DIAGNOSIS — K219 Gastro-esophageal reflux disease without esophagitis: Secondary | ICD-10-CM | POA: Insufficient documentation

## 2011-05-25 DIAGNOSIS — E119 Type 2 diabetes mellitus without complications: Secondary | ICD-10-CM | POA: Insufficient documentation

## 2011-05-25 DIAGNOSIS — R109 Unspecified abdominal pain: Secondary | ICD-10-CM | POA: Insufficient documentation

## 2011-05-25 DIAGNOSIS — R000 Tachycardia, unspecified: Secondary | ICD-10-CM | POA: Insufficient documentation

## 2011-05-25 DIAGNOSIS — R10816 Epigastric abdominal tenderness: Secondary | ICD-10-CM | POA: Insufficient documentation

## 2011-05-25 DIAGNOSIS — R002 Palpitations: Secondary | ICD-10-CM | POA: Insufficient documentation

## 2011-05-25 DIAGNOSIS — R110 Nausea: Secondary | ICD-10-CM | POA: Insufficient documentation

## 2011-05-25 DIAGNOSIS — R5381 Other malaise: Secondary | ICD-10-CM | POA: Insufficient documentation

## 2011-05-25 DIAGNOSIS — R5383 Other fatigue: Secondary | ICD-10-CM | POA: Insufficient documentation

## 2011-05-25 LAB — DIFFERENTIAL
Basophils Absolute: 0 10*3/uL (ref 0.0–0.1)
Basophils Relative: 0 % (ref 0–1)
Eosinophils Absolute: 0 10*3/uL (ref 0.0–0.7)
Eosinophils Relative: 0 % (ref 0–5)
Lymphocytes Relative: 9 % — ABNORMAL LOW (ref 12–46)
Lymphs Abs: 0.6 10*3/uL — ABNORMAL LOW (ref 0.7–4.0)
Monocytes Absolute: 0.5 10*3/uL (ref 0.1–1.0)
Monocytes Relative: 8 % (ref 3–12)
Neutro Abs: 5.6 10*3/uL (ref 1.7–7.7)
Neutrophils Relative %: 83 % — ABNORMAL HIGH (ref 43–77)

## 2011-05-25 LAB — URINALYSIS, ROUTINE W REFLEX MICROSCOPIC
Bilirubin Urine: NEGATIVE
Glucose, UA: NEGATIVE mg/dL
Hgb urine dipstick: NEGATIVE
Ketones, ur: 40 mg/dL — AB
Nitrite: NEGATIVE
Protein, ur: NEGATIVE mg/dL
Specific Gravity, Urine: 1.026 (ref 1.005–1.030)
Urobilinogen, UA: 1 mg/dL (ref 0.0–1.0)
pH: 7 (ref 5.0–8.0)

## 2011-05-25 LAB — CBC
HCT: 41.8 % (ref 36.0–46.0)
Hemoglobin: 13.7 g/dL (ref 12.0–15.0)
MCH: 26.8 pg (ref 26.0–34.0)
MCHC: 32.8 g/dL (ref 30.0–36.0)
MCV: 81.8 fL (ref 78.0–100.0)
Platelets: 287 10*3/uL (ref 150–400)
RBC: 5.11 MIL/uL (ref 3.87–5.11)
RDW: 15.1 % (ref 11.5–15.5)
WBC: 6.7 10*3/uL (ref 4.0–10.5)

## 2011-05-25 LAB — COMPREHENSIVE METABOLIC PANEL
ALT: 31 U/L (ref 0–35)
AST: 20 U/L (ref 0–37)
Albumin: 3.8 g/dL (ref 3.5–5.2)
Alkaline Phosphatase: 46 U/L (ref 39–117)
BUN: 12 mg/dL (ref 6–23)
CO2: 30 mEq/L (ref 19–32)
Calcium: 9.9 mg/dL (ref 8.4–10.5)
Chloride: 97 mEq/L (ref 96–112)
Creatinine, Ser: 0.64 mg/dL (ref 0.50–1.10)
GFR calc Af Amer: >90 mL/min (ref >90)
GFR calc non Af Amer: >90 mL/min (ref >90)
Glucose, Bld: 129 mg/dL — ABNORMAL HIGH (ref 70–99)
Potassium: 3.6 mEq/L (ref 3.5–5.1)
Sodium: 136 mEq/L (ref 135–145)
Total Bilirubin: 0.3 mg/dL (ref 0.3–1.2)
Total Protein: 8 g/dL (ref 6.0–8.3)

## 2011-05-25 LAB — URINE MICROSCOPIC-ADD ON

## 2011-05-25 LAB — TROPONIN I: Troponin I: <0.3 ng/mL (ref <0.30)

## 2011-05-25 MED ORDER — ONDANSETRON HCL 4 MG/2ML IJ SOLN
4.0000 mg | Freq: Once | INTRAMUSCULAR | Status: AC
  Administered 2011-05-25: 4 mg via INTRAVENOUS
  Filled 2011-05-25: qty 2

## 2011-05-25 MED ORDER — SODIUM CHLORIDE 0.9 % IV BOLUS (SEPSIS)
1000.0000 mL | Freq: Once | INTRAVENOUS | Status: AC
  Administered 2011-05-25: 1000 mL via INTRAVENOUS

## 2011-05-25 NOTE — ED Provider Notes (Signed)
History     CSN: [id number]  Arrival date & time 05/25/11  1956   First MD Initiated Contact with Patient 05/25/11 2153      Chief Complaint  Patient presents with  . Tachycardia  . Nausea    (Consider location/radiation/quality/duration/timing/severity/associated sxs/prior treatment) Patient is a 46 y.o. female presenting with weakness and abdominal pain. The history is provided by the patient.  Weakness The primary symptoms include nausea. Primary symptoms do not include fever or vomiting. Primary symptoms comment: fatigue, generalized weakness The symptoms began more than 1 week ago. The symptoms are worsening. The neurological symptoms are diffuse.  Additional symptoms include weakness (generalized).  Abdominal Pain The primary symptoms of the illness include abdominal pain and nausea. The primary symptoms of the illness do not include fever, shortness of breath, vomiting or diarrhea. Primary symptoms comment: fatigue, generalized weakness Episode onset: 1 week ago. The onset of the illness was gradual. The problem has been gradually worsening (fluctuates).  The abdominal pain radiates to the epigastric region. The abdominal pain is relieved by bowel movement.    Past Medical History  Diagnosis Date  . Hypertension   . Diabetes mellitus   . High cholesterol   . IBS (irritable bowel syndrome)   . GERD (gastroesophageal reflux disease)     Past Surgical History  Procedure Date  . Cholecystectomy     Family History  Problem Relation Age of Onset  . Colon cancer Neg Hx   . Prostate cancer Father   . Diabetes Mother   . Diabetes Paternal Grandmother   . Diabetes Father   . Heart disease Mother     History  Substance Use Topics  . Smoking status: Never Smoker   . Smokeless tobacco: Not on file  . Alcohol Use: No    OB History    Grav Para Term Preterm Abortions TAB SAB Ect Mult Living                  Review of Systems  Constitutional: Positive for appetite  change (dieting). Negative for fever.  HENT: Negative for congestion.   Respiratory: Negative for cough and shortness of breath.   Cardiovascular: Positive for palpitations. Negative for chest pain.  Gastrointestinal: Positive for nausea and abdominal pain. Negative for vomiting and diarrhea.  Genitourinary: Negative for difficulty urinating.  Neurological: Positive for weakness (generalized).  All other systems reviewed and are negative.    Allergies  Ace inhibitors and Shellfish allergy  Home Medications   Current Outpatient Rx  Name Route Sig Dispense Refill  . B COMPLEX VITAMINS PO CAPS Oral Take 1 capsule by mouth daily.      Marland Kitchen VITAMIN D 1000 UNITS PO TABS Oral Take 1,000 Units by mouth daily.      Marland Kitchen EPINEPHRINE 0.3 MG/0.3ML IJ DEVI Intramuscular Inject 0.3 mg into the muscle once as needed. For severe allergic reaction    . OLMESARTAN MEDOXOMIL 5 MG PO TABS Oral Take 5 mg by mouth daily.      Marland Kitchen OMEPRAZOLE 40 MG PO CPDR Oral Take 40 mg by mouth daily.      Marland Kitchen SITAGLIPTIN PHOSPHATE 100 MG PO TABS Oral Take 100 mg by mouth daily.      . TRIAMTERENE 50 MG PO CAPS Oral Take 50 mg by mouth daily.       BP 128/65  Pulse 91  Temp(Src) 98.3 F (36.8 C) (Oral)  Resp 20  SpO2 98%  LMP 05/18/2011  Physical Exam  Nursing  note and vitals reviewed. Constitutional: She is oriented to person, place, and time. She appears well-developed and well-nourished. No distress.  HENT:  Head: Normocephalic and atraumatic.  Mouth/Throat: Oropharynx is clear and moist.  Eyes: Conjunctivae are normal. Pupils are equal, round, and reactive to light. No scleral icterus.  Neck: Neck supple.  Cardiovascular: Normal rate, regular rhythm, normal heart sounds and intact distal pulses.   No murmur heard. Pulmonary/Chest: Effort normal and breath sounds normal. No stridor. No respiratory distress. She has no rales.  Abdominal: Soft. Bowel sounds are normal. She exhibits no distension. There is tenderness  in the epigastric area. There is no rigidity, no rebound and no guarding.  Musculoskeletal: Normal range of motion.  Neurological: She is alert and oriented to person, place, and time.  Skin: Skin is warm and dry. No rash noted.  Psychiatric: She has a normal mood and affect. Her behavior is normal.    ED Course  Procedures (including critical care time)  Labs Reviewed  DIFFERENTIAL - Abnormal; Notable for the following:    Neutrophils Relative 83 (*)    Lymphocytes Relative 9 (*)    Lymphs Abs 0.6 (*)    All other components within normal limits  COMPREHENSIVE METABOLIC PANEL - Abnormal; Notable for the following:    Glucose, Bld 129 (*)    All other components within normal limits  URINALYSIS, ROUTINE W REFLEX MICROSCOPIC - Abnormal; Notable for the following:    APPearance CLOUDY (*)    Ketones, ur 40 (*)    Leukocytes, UA SMALL (*)    All other components within normal limits  URINE MICROSCOPIC-ADD ON - Abnormal; Notable for the following:    Squamous Epithelial / LPF MANY (*)    Bacteria, UA FEW (*)    All other components within normal limits  CBC  TROPONIN I   Dg Chest 2 View  05/25/2011  *RADIOLOGY REPORT*  Clinical Data: Weakness, nausea, palpitations.  CHEST - 2 VIEW  Comparison: 03/11/2009  Findings: Mild lung base opacities.  Mild peribronchial thickening. Heart size within normal limits.  No pleural effusion or pneumothorax.  No acute osseous abnormality.  IMPRESSION: Mild peribronchial cuffing.  Mild lung base opacities; atelectasis versus early infiltrate.  Original Report Authenticated By: Waneta Martins, M.D.   All radiology studies independently viewed by me.         Date: 05/26/2011  Rate: 93  Rhythm: normal sinus rhythm  QRS Axis: normal  Intervals: normal  ST/T Wave abnormalities: normal  Conduction Disutrbances:none  Narrative Interpretation:   Old EKG Reviewed: unchanged      1. Fatigue   2. Nausea   3. Abdominal pain       MDM  46 yo  female with hx of IBS, GERD, and DM presenting with fatigue, abdominal pain, and nausea x 1 week.  Worse today.  Hx of cholecystectomy.  Well appearing, in NAD.  VSS.  Abdominal pain and nausea likely secondary to GERD or IBS.  Better with bowel movements.  Complained of jitteriness and palpitations.  EKG normal.  Troponins obtained from triage and negative.  CXR also obtained in triage and demonstrates possible developing pneumonia.  She has no cough, fevers, or shortness of breath.  No leukocytosis.  Labwork unremarkable.  IV fluids and Zofran given with improvement in symptoms.    DC'd home with PCP follow up.          Warnell Forester, MD 05/26/11 [phone]

## 2011-05-25 NOTE — ED Notes (Signed)
Patient states her heart has been racing all week, feeling jittery and nausea.  Patient denies any chest pain, states she feels like she has bad Acid Reflux.

## 2011-05-25 NOTE — ED Notes (Signed)
Patient reports generalized weakness and episodes where she feels her heart racing. States epigastric pain with nausea unable to say how long felt this way.

## 2011-05-25 NOTE — ED Notes (Signed)
Received bedside report from [place], [place].  Patient currently resting quietly in bed; no respiratory or acute distress noted.  Patient states that she feels better after the Zofran; denies nausea.  Patient has no other questions or concerns at this time; will continue to monitor.

## 2011-05-26 LAB — GLUCOSE, CAPILLARY: Glucose-Capillary: 99 mg/dL (ref 70–99)

## 2011-05-26 MED ORDER — OLMESARTAN MEDOXOMIL 5 MG PO TABS
5.0000 mg | ORAL_TABLET | ORAL | Status: AC
  Administered 2011-05-26: 5 mg via ORAL
  Filled 2011-05-26: qty 1

## 2011-05-26 MED ORDER — ONDANSETRON HCL 4 MG PO TABS: 4.0000 mg | ORAL_TABLET | Freq: Four times a day (QID) | ORAL | Status: AC

## 2011-05-26 NOTE — ED Notes (Signed)
Patient given discharge paperwork; went over discharge instructions with patient.  Patient instructed to follow up with her primary care physician as scheduled, to take Zofran as directed, and to return to the ED for new, worsening, or concerning symptoms.

## 2011-05-26 NOTE — ED Provider Notes (Signed)
I saw and evaluated the patient, reviewed the resident's note and I agree with the findings and plan. I saw and evaluated the EKG and agree with the resident's interpretation. Patient with symptoms most consistent with IBS and GERD. She has no right upper quadrant tenderness concerning for liver pathology. She is status post cholecystectomy. No left upper quadrant pain concerning for pancreatitis. She is well appearing and tolerating by mouth's after IV fluids and Zofran she is feeling better. There is no sign of abnormalities on lab findings.  Gwyneth Sprout, MD 05/26/11 [phone]

## 2011-05-29 ENCOUNTER — Encounter: Payer: Self-pay | Admitting: *Deleted

## 2011-06-04 ENCOUNTER — Encounter: Payer: Self-pay | Admitting: Internal Medicine

## 2011-06-04 ENCOUNTER — Ambulatory Visit (INDEPENDENT_AMBULATORY_CARE_PROVIDER_SITE_OTHER): Payer: No Typology Code available for payment source | Admitting: Internal Medicine

## 2011-06-04 DIAGNOSIS — R079 Chest pain, unspecified: Secondary | ICD-10-CM

## 2011-06-04 DIAGNOSIS — R7989 Other specified abnormal findings of blood chemistry: Secondary | ICD-10-CM

## 2011-06-04 DIAGNOSIS — R1011 Right upper quadrant pain: Secondary | ICD-10-CM

## 2011-06-04 NOTE — Patient Instructions (Signed)
You have been scheduled for an abdominal ultrasound at [facility] (1st floor of hospital) on 06/10/11 at 7:30 am. Please arrive 15 minutes prior to your appointment for registration. Make certain not to have anything to eat or drink 6 hours prior to your appointment. Should you need to reschedule your appointment, please contact radiology at [phone]. CC: Dr Antony Haste

## 2011-06-04 NOTE — Progress Notes (Signed)
Maria Sharp 1966/02/17 MRN [id number]     History of Present Illness:  This is a 46 year old African American female evaluated for acute chest pain which occurred last week and was seen in the emergency room were a cardiac workup was negative. She has a history of gastroesophageal reflux and is taking Prilosec 40 mg twice a day. An upper endoscopy in February 2012 showed an essentially normal exam with mild chronic gastritis. Her H. pylori was negative. She is status post cholecystectomy in 2002 with a negative ultrasound in January 2012. She is overweight. She works third shift, having meals in the middle of the night. Since her emergency room visit, she has modified her diet to a low fat diet, small meals and has felt markedly improved. Her last colonoscopy in November 2009 was normal. Her intermittent chest pain is thought to be  secondary to gastroesophageal reflux.   Past Medical History  Diagnosis Date  . Hypertension   . Diabetes mellitus   . High cholesterol   . IBS (irritable bowel syndrome)   . GERD (gastroesophageal reflux disease)   . Hypertension   . Gastritis    Past Surgical History  Procedure Date  . Cholecystectomy     reports that she has never smoked. She has never used smokeless tobacco. She reports that she does not drink alcohol or use illicit drugs. family history includes Diabetes in her father, mother, and paternal grandmother; Heart disease in her mother; and Prostate cancer in her father.  There is no history of Colon cancer. Allergies  Allergen Reactions  . Ace Inhibitors Anaphylaxis  . Shellfish Allergy Other (See Comments)    Only shrimp        Review of Systems: Denies dysphagia, odynophagia, shortness of breath. Positive for noncardiac chest pain  The remainder of the 10 point ROS is negative except as outlined in H&P   Physical Exam: General appearance  Well developed, in no distress. Eyes- non icteric. HEENT nontraumatic,  normocephalic. Mouth no lesions, tongue papillated, no cheilosis. Neck supple without adenopathy, thyroid not enlarged, no carotid bruits, no JVD. Lungs Clear to auscultation bilaterally. Cor normal S1, normal S2, regular rhythm, no murmur,  quiet precordium. Abdomen: Post cholecystectomy scar. Tender right upper quadrant along the right costal margin and slightly in the epigastrium. No palpable mass. Liver edge at costal margin. No ascites. Lower abdomen unremarkable.  Rectal: Not done Extremities no pedal edema. Skin no lesions. Neurological alert and oriented x 3. Psychological normal mood and affect.  Assessment and Plan:  Problem #1 Chest and abdominal pain. Patient is status post remote cholecystectomy with remaining tenderness in the right upper quadrant, likely from fatty liver. Her ALT is slightly elevated. We will obtain an ultrasound of the right upper quadrant to rule out possibility of a retained common bile duct stone. She will continue on a strict low-fat diet with modifications. She will continue on Prilosec 40 mg twice a day and add antacids such as Gaviscon on a when necessary basis. Dr. Cyndia Bent has given her a prescription for H2RA and I told her she can take that for breakthrough symptoms. She is up-to-date on her upper endoscopy and colonoscopy.    06/04/2011 Maria Sharp

## 2011-06-10 ENCOUNTER — Ambulatory Visit (HOSPITAL_COMMUNITY)
Admission: RE | Admit: 2011-06-10 | Discharge: 2011-06-10 | Disposition: A | Discharge status: Home / Self Care | Payer: No Typology Code available for payment source | Source: Ambulatory Visit | Attending: Internal Medicine | Admitting: Internal Medicine

## 2011-06-10 ENCOUNTER — Other Ambulatory Visit (HOSPITAL_COMMUNITY): Payer: Self-pay | Admitting: Family Medicine

## 2011-06-10 DIAGNOSIS — Z9089 Acquired absence of other organs: Secondary | ICD-10-CM | POA: Insufficient documentation

## 2011-06-10 DIAGNOSIS — K7689 Other specified diseases of liver: Secondary | ICD-10-CM | POA: Insufficient documentation

## 2011-06-10 DIAGNOSIS — R1011 Right upper quadrant pain: Secondary | ICD-10-CM | POA: Insufficient documentation

## 2011-06-10 DIAGNOSIS — R7989 Other specified abnormal findings of blood chemistry: Secondary | ICD-10-CM | POA: Insufficient documentation

## 2011-06-25 ENCOUNTER — Other Ambulatory Visit: Payer: Self-pay | Admitting: Internal Medicine

## 2011-12-27 ENCOUNTER — Other Ambulatory Visit (HOSPITAL_COMMUNITY): Payer: No Typology Code available for payment source

## 2012-01-07 ENCOUNTER — Other Ambulatory Visit (HOSPITAL_COMMUNITY): Payer: Self-pay | Admitting: Family Medicine

## 2012-01-07 DIAGNOSIS — R079 Chest pain, unspecified: Secondary | ICD-10-CM

## 2012-01-20 ENCOUNTER — Ambulatory Visit (INDEPENDENT_AMBULATORY_CARE_PROVIDER_SITE_OTHER): Payer: No Typology Code available for payment source | Admitting: Cardiology

## 2012-01-20 ENCOUNTER — Encounter: Payer: Self-pay | Admitting: Cardiology

## 2012-01-20 ENCOUNTER — Ambulatory Visit (HOSPITAL_COMMUNITY): Payer: No Typology Code available for payment source | Attending: Cardiology

## 2012-01-20 VITALS — BP 149/100 | HR 70 | Ht 70.0 in | Wt 250.6 lb

## 2012-01-20 DIAGNOSIS — E119 Type 2 diabetes mellitus without complications: Secondary | ICD-10-CM | POA: Insufficient documentation

## 2012-01-20 DIAGNOSIS — R072 Precordial pain: Secondary | ICD-10-CM | POA: Insufficient documentation

## 2012-01-20 DIAGNOSIS — R002 Palpitations: Secondary | ICD-10-CM

## 2012-01-20 DIAGNOSIS — R079 Chest pain, unspecified: Secondary | ICD-10-CM

## 2012-01-20 DIAGNOSIS — I10 Essential (primary) hypertension: Secondary | ICD-10-CM | POA: Insufficient documentation

## 2012-01-20 DIAGNOSIS — I059 Rheumatic mitral valve disease, unspecified: Secondary | ICD-10-CM | POA: Insufficient documentation

## 2012-01-20 DIAGNOSIS — I369 Nonrheumatic tricuspid valve disorder, unspecified: Secondary | ICD-10-CM | POA: Insufficient documentation

## 2012-01-20 NOTE — Patient Instructions (Addendum)
Continue current medications as listed.  Your physician has requested that you have an exercise tolerance test. For further information please visit [email or web address]. Please also follow instruction sheet, as given.   

## 2012-01-20 NOTE — Progress Notes (Signed)
HPI The patient presents for evaluation of chest discomfort. She said this started about 3 or 4 weeks ago. She really describes symptoms of "her heart turning over". This seems with emotional stress.  She says this will happen 3 or 4 times in a row over a few minutes. She's had about 4 or 5 spells like this. She really doesn't describe it as palpitations but says pain. However, she's not describing substernal chest discomfort, neck or arm discomfort. She doesn't have this with activities though she stopped exercising when he started. She said she's been under more stress at work. She's not had any PND or orthopnea. She's not had any new shortness of breath. She's had no new weight gain or edema. She doesn't think she's had symptoms like this before. Of note she had an echocardiogram this morning and I reviewed this. There were no valvular abnormalities and she otherwise essentially had a normal heart. She was seen here in the past for atypical chest pain. I do not see the results of the stress that she had approximately 3 years ago.  Allergies  Allergen Reactions  . Ace Inhibitors Anaphylaxis  . Shellfish Allergy Other (See Comments)    Only shrimp    Current Outpatient Prescriptions  Medication Sig Dispense Refill  . atorvastatin (LIPITOR) 10 MG tablet Take 10 mg by mouth daily.      Marland Kitchen b complex vitamins capsule Take 1 capsule by mouth daily.        . Biotin 5000 MCG CAPS Take 1 capsule by mouth daily.      . cholecalciferol (VITAMIN D) 1000 UNITS tablet Take 1,000 Units by mouth daily.        Marland Kitchen EPINEPHrine (EPI-PEN) 0.3 mg/0.3 mL DEVI Inject 0.3 mg into the muscle once as needed. For severe allergic reaction      . Norgestimate-Eth Estradiol (ORTHO-CYCLEN, 28, PO) Take by mouth daily.      Marland Kitchen olmesartan (BENICAR) 5 MG tablet Take 5 mg by mouth daily.        Marland Kitchen omeprazole (PRILOSEC) 40 MG capsule take 1 tablet by mouth once daily  30 capsule  3  . sitaGLIPtin (JANUVIA) 100 MG tablet Take 100  mg by mouth daily.        Marland Kitchen triamterene (DYRENIUM) 50 MG capsule Take 50 mg by mouth daily.         Past Medical History  Diagnosis Date  . Hypertension   . Diabetes mellitus   . High cholesterol   . IBS (irritable bowel syndrome)   . GERD (gastroesophageal reflux disease)   . Hypertension   . Gastritis     Past Surgical History  Procedure Date  . Cholecystectomy     ROS:  As stated in the HPI and negative for all other systems.  PHYSICAL EXAM BP 149/100  Pulse 70  Ht 5\' 10"  (1.778 m)  Wt 113.671 kg (250 lb 9.6 oz)  BMI 35.96 kg/m2 GENERAL:  Well appearing HEENT:  Pupils equal round and reactive, fundi not visualized, oral mucosa unremarkable NECK:  No jugular venous distention, waveform within normal limits, carotid upstroke brisk and symmetric, no bruits, no thyromegaly LYMPHATICS:  No cervical, inguinal adenopathy LUNGS:  Clear to auscultation bilaterally BACK:  No CVA tenderness CHEST:  Unremarkable HEART:  PMI not displaced or sustained,S1 and S2 within normal limits, no S3, no S4, no clicks, no rubs, no murmurs ABD:  Flat, positive bowel sounds normal in frequency in pitch, no bruits, no rebound,  no guarding, no midline pulsatile mass, no hepatomegaly, no splenomegaly EXT:  2 plus pulses throughout, no edema, no cyanosis no clubbing SKIN:  No rashes no nodules NEURO:  Cranial nerves II through XII grossly intact, motor grossly intact throughout PSYCH:  Cognitively intact, oriented to person place and time   EKG:   Normal sinus rhythm, rate 67, right bundle branch block, first degree AV block, no acute ST-T wave changes. 01/20/2012  ASSESSMENT AND PLAN  PALPITATIONS  These are not causing presyncope or syncope. However, they are somewhat distressing. I will have her do an exercise treadmill to make sure she otherwise has a structurally normal heart between this and the echo. If so no further workup or treatment would be suggested unless they become more symptomatic  at which point we could look at her medical regimen for further management.   HYPERTENSION Her blood pressure is controlled and she will continue the meds as listed.

## 2012-01-20 NOTE — Progress Notes (Signed)
Echocardiogram performed.  

## 2012-01-21 ENCOUNTER — Encounter (HOSPITAL_COMMUNITY): Payer: Self-pay | Admitting: Family Medicine

## 2012-02-10 ENCOUNTER — Ambulatory Visit (INDEPENDENT_AMBULATORY_CARE_PROVIDER_SITE_OTHER): Payer: No Typology Code available for payment source | Admitting: Cardiology

## 2012-02-10 ENCOUNTER — Other Ambulatory Visit: Payer: Self-pay | Admitting: *Deleted

## 2012-02-10 DIAGNOSIS — R002 Palpitations: Secondary | ICD-10-CM

## 2012-02-10 MED ORDER — OLMESARTAN MEDOXOMIL 20 MG PO TABS: 20.0000 mg | ORAL_TABLET | Freq: Every day | ORAL | Status: DC

## 2012-02-10 NOTE — Procedures (Signed)
Exercise Treadmill Test  Pre-Exercise Testing Evaluation Rhythm: normal sinus  Rate: 73   PR:  .16 QRS:  .09  QT:  .38 QTc: .41     Test  Exercise Tolerance Test Ordering MD: Angelina Sheriff, MD  Interpreting MD: Angelina Sheriff, MD  Unique Test No: 1  Treadmill:  1  Indication for ETT: Palps  Contraindication to ETT: No   Stress Modality: exercise - treadmill  Cardiac Imaging Performed: non   Protocol: standard Bruce - maximal  Max BP:  183/76  Max MPHR (bpm):  175 85% MPR (bpm):  149  MPHR obtained (bpm):  160 % MPHR obtained:  91  Reached 85% MPHR (min:sec):  7:00 Total Exercise Time (min-sec):  9:00  Workload in METS:  9.9 Borg Scale: 17  Reason ETT Terminated:  desired heart rate attained    ST Segment Analysis At Rest: normal ST segments - no evidence of significant ST depression With Exercise: no evidence of significant ST depression  Other Information Arrhythmia:  No Angina during ETT:  absent (0) Quality of ETT:  diagnostic  ETT Interpretation:  normal - no evidence of ischemia by ST analysis  Comments: The patient had an good exercise tolerance.  There was no chest pain.  There was an appropriate level of dyspnea.  There were no arrhythmias, a normal heart rate response and normal BP response.  There were no ischemic ST T wave changes and a normal heart rate recovery.  Recommendations: Negative adequate ETT.  No further testing is indicated.  Based on the above I gave the patient a prescription for exercise.

## 2012-05-28 ENCOUNTER — Telehealth: Payer: Self-pay | Admitting: Internal Medicine

## 2012-05-28 ENCOUNTER — Encounter: Payer: Self-pay | Admitting: Internal Medicine

## 2012-05-28 DIAGNOSIS — E1165 Type 2 diabetes mellitus with hyperglycemia: Secondary | ICD-10-CM | POA: Insufficient documentation

## 2012-05-28 DIAGNOSIS — IMO0002 Reserved for concepts with insufficient information to code with codable children: Secondary | ICD-10-CM | POA: Insufficient documentation

## 2012-05-28 NOTE — Telephone Encounter (Signed)
Sees Dr. Juanda Chance for IBS and has recently had a bout of acid reflux.  Dr. Regino Schultze first available was March 26.  The patient said that she could not wait that long.

## 2012-05-29 NOTE — Telephone Encounter (Signed)
Scheduled patient on 06/10/12 at 8:45 AM for OV. She will bring in her FMLA papers for her IBS to medical records.

## 2012-06-01 ENCOUNTER — Encounter: Payer: Self-pay | Admitting: *Deleted

## 2012-06-01 ENCOUNTER — Telehealth: Payer: Self-pay | Admitting: Internal Medicine

## 2012-06-01 NOTE — Telephone Encounter (Signed)
Spoke with patient and she has had RUQ pain x 1 week. States every time she eats, she has diarrhea and nausea. She is afraid to eat. Scheduled with Dr. Juanda Chance on 06/02/12 at 2:15 PM.

## 2012-06-02 ENCOUNTER — Ambulatory Visit (INDEPENDENT_AMBULATORY_CARE_PROVIDER_SITE_OTHER): Payer: No Typology Code available for payment source | Admitting: Internal Medicine

## 2012-06-02 ENCOUNTER — Other Ambulatory Visit (INDEPENDENT_AMBULATORY_CARE_PROVIDER_SITE_OTHER): Payer: No Typology Code available for payment source

## 2012-06-02 ENCOUNTER — Encounter: Payer: Self-pay | Admitting: Internal Medicine

## 2012-06-02 VITALS — BP 110/82 | HR 78 | Ht 70.5 in | Wt 256.2 lb

## 2012-06-02 DIAGNOSIS — R1011 Right upper quadrant pain: Secondary | ICD-10-CM

## 2012-06-02 LAB — CBC WITH DIFFERENTIAL/PLATELET
Basophils Absolute: 0.1 10*3/uL (ref 0.0–0.1)
Basophils Relative: 1.6 % (ref 0.0–3.0)
Eosinophils Absolute: 0.1 10*3/uL (ref 0.0–0.7)
Eosinophils Relative: 0.8 % (ref 0.0–5.0)
HCT: 40.8 % (ref 36.0–46.0)
Hemoglobin: 13.5 g/dL (ref 12.0–15.0)
Lymphocytes Relative: 34.1 % (ref 12.0–46.0)
Lymphs Abs: 2.6 10*3/uL (ref 0.7–4.0)
MCHC: 33.1 g/dL (ref 30.0–36.0)
MCV: 83.1 fl (ref 78.0–100.0)
Monocytes Absolute: 0.8 10*3/uL (ref 0.1–1.0)
Monocytes Relative: 10.8 % (ref 3.0–12.0)
Neutro Abs: 4 10*3/uL (ref 1.4–7.7)
Neutrophils Relative %: 52.7 % (ref 43.0–77.0)
Platelets: 353 10*3/uL (ref 150.0–400.0)
RBC: 4.92 Mil/uL (ref 3.87–5.11)
RDW: 14.4 % (ref 11.5–14.6)
WBC: 7.6 10*3/uL (ref 4.5–10.5)

## 2012-06-02 LAB — HEPATIC FUNCTION PANEL
ALT: 24 U/L (ref 0–35)
AST: 16 U/L (ref 0–37)
Albumin: 4 g/dL (ref 3.5–5.2)
Alkaline Phosphatase: 51 U/L (ref 39–117)
Bilirubin, Direct: 0.1 mg/dL (ref 0.0–0.3)
Total Bilirubin: 0.6 mg/dL (ref 0.3–1.2)
Total Protein: 7.9 g/dL (ref 6.0–8.3)

## 2012-06-02 LAB — AMYLASE: Amylase: 68 U/L (ref 27–131)

## 2012-06-02 LAB — LIPASE: Lipase: 60 U/L — ABNORMAL HIGH (ref 11.0–59.0)

## 2012-06-02 MED ORDER — PROMETHAZINE HCL 25 MG PO TABS: ORAL_TABLET | ORAL | Status: DC

## 2012-06-02 MED ORDER — OMEPRAZOLE 40 MG PO CPDR: 40.0000 mg | DELAYED_RELEASE_CAPSULE | Freq: Two times a day (BID) | ORAL | Status: DC

## 2012-06-02 NOTE — Patient Instructions (Addendum)
You have been scheduled for an endoscopy with propofol. Please follow written instructions given to you at your visit today. If you use inhalers (even only as needed) or a CPAP machine, please bring them with you on the day of your procedure.  You have been scheduled for an abdominal ultrasound at [facility] (1st floor of hospital) on 06/09/12 at 9:00 am. Please arrive 15 minutes prior to your appointment for registration. Make certain not to have anything to eat or drink 6 hours prior to your appointment. Should you need to reschedule your appointment, please contact radiology at [phone]. This test typically takes about 30 minutes to perform.  Your physician has requested that you go to the basement for the following lab work before leaving today: Amylase, Lipase, Hepatic Function, CBC  We have sent the following medications to your pharmacy for you to pick up at your convenience: Prilosec-Take 40 mg twice daily Phenergan-Take 1/2-1 tablet by mouth every 6 hours as needed.  CC: Dr Dimas Chyle

## 2012-06-02 NOTE — Progress Notes (Signed)
Maria Sharp 28-Oct-1965 MRN [id number]  History of Present Illness:  This is a 47 year old African American female with an episode of severe epigastric and right upper quadrant abdominal pain, nausea, vomiting and diarrhea without fever. She missed 4 days at work last week. She is slowly getting better. She still has pain but no diarrhea. She has similar attacks several times a month but not as severe as the last one. An upper endoscopy in February 2012 showed gastritis which was negative for H. pylori. An upper abdominal ultrasound showed postcholecystectomy state. A colonoscopy in November 2009 was normal. She had a laparoscopic cholecystectomy in 2002 for cholelithiasis. The pain resembles gallbladder pain. She was in the past evaluated for noncardiac chest pain. She has lost about 30 pounds since last year.   Past Medical History  Diagnosis Date  . Diabetes mellitus   . High cholesterol   . IBS (irritable bowel syndrome)   . GERD (gastroesophageal reflux disease)   . Hypertension   . Gastritis   . Fatty liver    Past Surgical History  Procedure Laterality Date  . Cholecystectomy      reports that she has never smoked. She has never used smokeless tobacco. She reports that she does not drink alcohol or use illicit drugs. family history includes Diabetes in her father, mother, and paternal grandmother; Heart disease in her mother; and Prostate cancer in her father.  There is no history of Colon cancer. Allergies  Allergen Reactions  . Ace Inhibitors Anaphylaxis  . Shellfish Allergy Other (See Comments)    Only shrimp        Review of Systems: Denies dysphagia or heartburn. Positive for abdominal pain and diarrhea  The remainder of the 10 point ROS is negative except as outlined in H&P   Physical Exam: General appearance  Well developed, in no distress. Overweight Eyes- non icteric. HEENT nontraumatic, normocephalic. Mouth no lesions, tongue papillated, no  cheilosis. Neck supple without adenopathy, thyroid not enlarged, no carotid bruits, no JVD. Lungs Clear to auscultation bilaterally. Cor normal S1, normal S2, regular rhythm, no murmur,  quiet precordium. Abdomen: Very tender epigastrium extending to right upper quadrant. Liver edge at costal margin. No ascites. No distention. Right lower and left lower quadrant were normal. No palpable mass. Rectal: Not done. Extremities no pedal edema. Skin no lesions. Neurological alert and oriented x 3. Psychological normal mood and affect.  Assessment and Plan:  Problem #1 Episode of acute gastroenteritis which is slowly resolving. There was no fever so infectious origin is less certain. Biliary microlithiasis is a possibility. We will obtain liver function tests, amylase and lipase and will also obtain an upper abdominal ultrasound. She will stay on a strict low-fat soft diet. We are filling out her FMLA form. She will increase her Prilosec to 40 mg twice a day. We will proceed with an upper endoscopy. For symptomatic relief, she will get Phenergan 25 mg.   06/02/2012 [name]

## 2012-06-09 ENCOUNTER — Ambulatory Visit (HOSPITAL_COMMUNITY)
Admission: RE | Admit: 2012-06-09 | Discharge: 2012-06-09 | Disposition: A | Discharge status: Home / Self Care | Payer: No Typology Code available for payment source | Source: Ambulatory Visit | Attending: Internal Medicine | Admitting: Internal Medicine

## 2012-06-09 DIAGNOSIS — Z9089 Acquired absence of other organs: Secondary | ICD-10-CM | POA: Insufficient documentation

## 2012-06-09 DIAGNOSIS — R1011 Right upper quadrant pain: Secondary | ICD-10-CM | POA: Insufficient documentation

## 2012-06-10 ENCOUNTER — Ambulatory Visit: Payer: No Typology Code available for payment source | Admitting: Internal Medicine

## 2012-06-12 ENCOUNTER — Other Ambulatory Visit: Payer: Self-pay | Admitting: Internal Medicine

## 2012-06-12 ENCOUNTER — Encounter: Payer: Self-pay | Admitting: Internal Medicine

## 2012-06-12 ENCOUNTER — Ambulatory Visit (AMBULATORY_SURGERY_CENTER): Payer: No Typology Code available for payment source | Admitting: Internal Medicine

## 2012-06-12 VITALS — BP 129/71 | HR 70 | Temp 96.1°F | Resp 40 | Ht 70.0 in | Wt 256.0 lb

## 2012-06-12 DIAGNOSIS — K2990 Gastroduodenitis, unspecified, without bleeding: Secondary | ICD-10-CM

## 2012-06-12 DIAGNOSIS — R1011 Right upper quadrant pain: Secondary | ICD-10-CM

## 2012-06-12 DIAGNOSIS — K2960 Other gastritis without bleeding: Secondary | ICD-10-CM

## 2012-06-12 DIAGNOSIS — K2970 Gastritis, unspecified, without bleeding: Secondary | ICD-10-CM

## 2012-06-12 DIAGNOSIS — K219 Gastro-esophageal reflux disease without esophagitis: Secondary | ICD-10-CM

## 2012-06-12 LAB — GLUCOSE, CAPILLARY
Glucose-Capillary: 105 mg/dL — ABNORMAL HIGH (ref 70–99)
Glucose-Capillary: 114 mg/dL — ABNORMAL HIGH (ref 70–99)

## 2012-06-12 MED ORDER — SODIUM CHLORIDE 0.9 % IV SOLN: 500.0000 mL | INTRAVENOUS | Status: DC

## 2012-06-12 NOTE — Op Note (Signed)
[facility] [address] [place], [address]   ENDOSCOPY PROCEDURE REPORT  PATIENT: Maria, Sharp  MR#: [id number] BIRTHDATE: [birth year] , 46  yrs. old GENDER: Female ENDOSCOPIST: Hart Carwin, MD REFERRED BY:  Italy Badger, M.D. PROCEDURE DATE:  06/12/2012 PROCEDURE:  EGD w/ biopsy ASA CLASS:     Class II INDICATIONS:  abdominal pain in the upper right quadrant. Heartburn.   last EGD 05/2010- CLO negative gastritis. MEDICATIONS: MAC sedation, administered by CRNA, propofol (Diprivan) 150mg  IV, and Glycopyrrolate (Robinul) .2 mg IV TOPICAL ANESTHETIC: none  DESCRIPTION OF PROCEDURE: After the risks benefits and alternatives of the procedure were thoroughly explained, informed consent was obtained.  The LB GIF-H180 [id number] endoscope was introduced through the mouth and advanced to the second portion of the duodenum. Without limitations.  The instrument was slowly withdrawn as the mucosa was fully examined.        STOMACH: Mild gastritis (inflammation) was found in the gastric antrum.  A biopsy was performed using cold forceps.  Sample obtained for helicobacter pylori testing.  Retroflexed views revealed no abnormalities.     The scope was then withdrawn from the patient and the procedure completed.  COMPLICATIONS: There were no complications. ENDOSCOPIC IMPRESSION: Gastritis (inflammation) was found in the gastric antrum; biopsy  RECOMMENDATIONS: Await biopsy results continue PPI,dietary modifications , Phenergan prn  REPEAT EXAM: .  no recall  eSigned:  Hart Carwin, MD 06/12/2012 9:31 AM   CC:

## 2012-06-12 NOTE — Patient Instructions (Addendum)

## 2012-06-12 NOTE — Progress Notes (Signed)
Patient did not have preoperative order for IV antibiotic SSI prophylaxis. (G8918)   

## 2012-06-12 NOTE — Progress Notes (Signed)
Called to room to assist during endoscopic procedure.  Patient ID and intended procedure confirmed with present staff. Received instructions for my participation in the procedure from the performing physician.  

## 2012-06-12 NOTE — Progress Notes (Signed)
Patient did not have preoperative order for IV antibiotic SSI prophylaxis. (G8918)  Patient did not experience any of the following events: a burn prior to discharge; a fall within the facility; wrong site/side/patient/procedure/implant event; or a hospital transfer or hospital admission upon discharge from the facility. (G8907)  

## 2012-06-12 NOTE — Progress Notes (Signed)
Report to pacu rn vss, bbs=clear incremental doses propofol given

## 2012-06-15 ENCOUNTER — Telehealth: Payer: Self-pay

## 2012-06-15 NOTE — Telephone Encounter (Signed)
  Follow up Call-  Call back number 06/12/2012  Post procedure Call Back phone  # [phone]  Permission to leave phone message Yes     Patient questions:  Do you have a fever, pain , or abdominal swelling? no Pain Score  0 *  Have you tolerated food without any problems? yes  Have you been able to return to your normal activities? yes  Do you have any questions about your discharge instructions: Diet   no Medications  no Follow up visit  no  Do you have questions or concerns about your Care? yes "Still not having bowel movements like I should.  I have IBS and I have an appt coming up in March."  Pt plans on discussing further with MD at this appt. Actions: * If pain score is 4 or above: No action needed, pain <4.

## 2012-06-16 ENCOUNTER — Encounter: Payer: Self-pay | Admitting: Internal Medicine

## 2012-07-03 ENCOUNTER — Encounter: Payer: Self-pay | Admitting: Internal Medicine

## 2012-07-03 ENCOUNTER — Ambulatory Visit (INDEPENDENT_AMBULATORY_CARE_PROVIDER_SITE_OTHER): Payer: No Typology Code available for payment source | Admitting: Internal Medicine

## 2012-07-03 VITALS — BP 128/86 | HR 83 | Ht 70.25 in | Wt 263.4 lb

## 2012-07-03 DIAGNOSIS — R1011 Right upper quadrant pain: Secondary | ICD-10-CM

## 2012-07-03 NOTE — Progress Notes (Signed)
Maria Sharp [birth year] MRN [id number]  History of Present Illness:  This is a 47 year old African American female with episodic nausea, vomiting and right upper quadrant abdominal pain causing her to miss work and requiring visits to the emergency room. Since her upper endoscopy several weeks ago, she has done reasonably well. She still has episodes of right upper quadrant abdominal discomfort radiating to the back. She had mild antral gastritis on an upper endoscopy in February 2014. Biopsies showed minimal chronic gastritis. Her upper abdominal ultrasound showed her to be status post cholecystectomy with a normal spleen and common bile duct. She is on Prilosec 40 mg twice a day and takes Phenergan when necessary. She had a mild elevation of serum lipase to 60 u/L. She is up-to-date on her colonoscopy which was done in November 2009 and was a normal exam. She had mild hepatomegaly on a CT scan of the abdomen in 2010 which was done for right upper quadrant abdominal pain. She is currently on FMLA leave each has been approved for 2 monthly episodes lasting 2 days each.   Past Medical History  Diagnosis Date  . Diabetes mellitus   . High cholesterol   . IBS (irritable bowel syndrome)   . GERD (gastroesophageal reflux disease)   . Hypertension   . Gastritis   . Fatty liver    Past Surgical History  Procedure Laterality Date  . Cholecystectomy      reports that she has never smoked. She has never used smokeless tobacco. She reports that she does not drink alcohol or use illicit drugs. family history includes Diabetes in her father, mother, and paternal grandmother; Heart disease in her mother; and Prostate cancer in her father.  There is no history of Colon cancer, and Esophageal cancer, and Stomach cancer, and Rectal cancer, . Allergies  Allergen Reactions  . Ace Inhibitors Anaphylaxis  . Shellfish Allergy Other (See Comments)    Only shrimp        Review of Systems: Denies  dysphagia heartburn  The remainder of the 10 point ROS is negative except as outlined in H&P   Physical Exam: General appearance  Well developed, in no distress. Eyes- non icteric. HEENT nontraumatic, normocephalic. Mouth no lesions, tongue papillated, no cheilosis. Neck supple without adenopathy, thyroid not enlarged, no carotid bruits, no JVD. Lungs Clear to auscultation bilaterally. Cor normal S1, normal S2, regular rhythm, no murmur,  quiet precordium. Abdomen: Tender right upper quadrant. No CVA tenderness. Mild tenderness of the thoracic spine. Rectal: Not done. Extremities no pedal edema. Skin no lesions. Neurological alert and oriented x 3. Psychological normal mood and affect.  Assessment and Plan:  Problem #1 Chronic episodic nausea or vomiting currently under good control. Right upper quadrant abdominal discomfort has been chronic and may be related to hepatomegaly or possibly due to an irritable bowel syndrome. Her back pain may be unrelated to abdominal pain and may be caused by degenerative joint disease. She will continue on PPI's, dietary modifications and use Phenergan on an as necessary basis.   07/03/2012 Maria Sharp

## 2012-07-03 NOTE — Patient Instructions (Addendum)
Please decide if you would like to see Dr Berniece Andreas or Dr Darrow Bussing  Let us know once you decide and we will schedule the appointment for you.

## 2012-07-10 ENCOUNTER — Telehealth: Payer: Self-pay | Admitting: Internal Medicine

## 2012-07-10 NOTE — Telephone Encounter (Signed)
Patient would like to see Dr Fabian Sharp to establish primary care within [facility]. I have spoken to [facility] @ Dr Fabian Sharp' office. Patient is scheduled to see Dr Fabian Sharp on 10/05/12 (first available date) @ 11:15 am. She will need to arrive at 11:00 am for registration. Per Joni Reining, she will send new patient paperwork to patient. I have also advised that I will ask patient to bring records from previous PCP (Dr Cyndia Bent). Patient has been advised of all of the above and verbalizes understanding.

## 2012-08-08 ENCOUNTER — Emergency Department (HOSPITAL_COMMUNITY)
Admission: EM | Admit: 2012-08-08 | Discharge: 2012-08-09 | Disposition: A | Discharge status: Home / Self Care | Payer: No Typology Code available for payment source | Attending: Emergency Medicine | Admitting: Emergency Medicine

## 2012-08-08 ENCOUNTER — Encounter (HOSPITAL_COMMUNITY): Payer: Self-pay | Admitting: *Deleted

## 2012-08-08 DIAGNOSIS — Z79899 Other long term (current) drug therapy: Secondary | ICD-10-CM | POA: Insufficient documentation

## 2012-08-08 DIAGNOSIS — M79601 Pain in right arm: Secondary | ICD-10-CM

## 2012-08-08 DIAGNOSIS — E7800 Pure hypercholesterolemia, unspecified: Secondary | ICD-10-CM | POA: Insufficient documentation

## 2012-08-08 DIAGNOSIS — I10 Essential (primary) hypertension: Secondary | ICD-10-CM | POA: Insufficient documentation

## 2012-08-08 DIAGNOSIS — Z8719 Personal history of other diseases of the digestive system: Secondary | ICD-10-CM | POA: Insufficient documentation

## 2012-08-08 DIAGNOSIS — E119 Type 2 diabetes mellitus without complications: Secondary | ICD-10-CM | POA: Insufficient documentation

## 2012-08-08 DIAGNOSIS — K219 Gastro-esophageal reflux disease without esophagitis: Secondary | ICD-10-CM | POA: Insufficient documentation

## 2012-08-08 DIAGNOSIS — M79609 Pain in unspecified limb: Secondary | ICD-10-CM | POA: Insufficient documentation

## 2012-08-08 NOTE — ED Notes (Signed)
Pt c/o right arm heaviness starting tonight with some neck puffy discomfort. Pt states that the heaviness in the arm feels resolved now that she is lying down. She experienced Chest tightness last night that lasted 3-4 minutes. Negative for deficits in all for extremities.

## 2012-08-08 NOTE — ED Notes (Signed)
The pt is c/o some rt arm heaviness for  30-45 minutes with some lt neck pain that has been intermittent.  More pain in the back of her neck tonight

## 2012-08-09 ENCOUNTER — Emergency Department (HOSPITAL_COMMUNITY): Payer: No Typology Code available for payment source

## 2012-08-09 NOTE — ED Provider Notes (Signed)
History     CSN: [id number]  Arrival date & time 08/08/12  2324   First MD Initiated Contact with Patient 08/08/12 2344      Chief Complaint  Patient presents with  . rt arm heqaviness for 30-49minutes     (Consider location/radiation/quality/duration/timing/severity/associated sxs/prior treatment) HPI...transient episode of right arm heaviness for approximately 30 minutes.  Symptoms have completely cleared.  No confusion, drooping of face, other extremity weakness.   No chest pain, dyspnea, diaphoresis.   nothing makes symptoms better or worse.   Severity is mild.  Past Medical History  Diagnosis Date  . Diabetes mellitus   . High cholesterol   . IBS (irritable bowel syndrome)   . GERD (gastroesophageal reflux disease)   . Hypertension   . Gastritis   . Fatty liver     Past Surgical History  Procedure Laterality Date  . Cholecystectomy      Family History  Problem Relation Age of Onset  . Colon cancer Neg Hx   . Esophageal cancer Neg Hx   . Stomach cancer Neg Hx   . Rectal cancer Neg Hx   . Prostate cancer Father   . Diabetes Father   . Diabetes Mother   . Heart disease Mother   . Diabetes Paternal Grandmother     History  Substance Use Topics  . Smoking status: Never Smoker   . Smokeless tobacco: Never Used  . Alcohol Use: No    OB History   Grav Para Term Preterm Abortions TAB SAB Ect Mult Living                  Review of Systems  All other systems reviewed and are negative.    Allergies  Ace inhibitors and Shellfish allergy  Home Medications   Current Outpatient Rx  Name  Route  Sig  Dispense  Refill  . atorvastatin (LIPITOR) 20 MG tablet   Oral   Take 20 mg by mouth daily.         . Biotin 5000 MCG CAPS   Oral   Take 1 capsule by mouth daily.         . cholecalciferol (VITAMIN D) 1000 UNITS tablet   Oral   Take 1,000 Units by mouth daily.           Marland Kitchen EPINEPHrine (EPI-PEN) 0.3 mg/0.3 mL DEVI   Intramuscular   Inject 0.3  mg into the muscle once as needed. For severe allergic reaction         . metFORMIN (GLUCOPHAGE) 1000 MG tablet   Oral   Take 1,000 mg by mouth daily with breakfast.          . olmesartan (BENICAR) 20 MG tablet   Oral   Take 1 tablet (20 mg total) by mouth daily.   30 tablet   3   . omeprazole (PRILOSEC) 40 MG capsule   Oral   Take 40 mg by mouth daily as needed (for acid reflux).         . triamterene (DYRENIUM) 50 MG capsule   Oral   Take 50 mg by mouth daily.            BP 118/61  Pulse 71  Temp(Src) 98.7 F (37.1 C) (Oral)  Resp 18  SpO2 99%  LMP 07/15/2012  Physical Exam  Nursing note and vitals reviewed. Constitutional: She is oriented to person, place, and time. She appears well-developed and well-nourished.  HENT:  Head: Normocephalic and atraumatic.  Eyes:  Conjunctivae and EOM are normal. Pupils are equal, round, and reactive to light.  Neck: Normal range of motion. Neck supple.  Cardiovascular: Normal rate, regular rhythm and normal heart sounds.   Pulmonary/Chest: Effort normal and breath sounds normal.  Abdominal: Soft. Bowel sounds are normal.  Musculoskeletal: Normal range of motion.  Neurological: She is alert and oriented to person, place, and time.  Skin: Skin is warm and dry.  Psychiatric: She has a normal mood and affect.    ED Course  Procedures (including critical care time)  Labs Reviewed - No data to display Ct Head Wo Contrast  08/09/2012  *RADIOLOGY REPORT*  Clinical Data: Transient right arm heaviness and general malaise.  CT HEAD WITHOUT CONTRAST  Technique:  Contiguous axial images were obtained from the base of the skull through the vertex without contrast.  Comparison: CT of the sinuses performed 05/25/2008, and MRI of the brain performed 05/14/2004  Findings: There is no evidence of acute infarction, mass lesion, or intra- or extra-axial hemorrhage on CT.  The posterior fossa, including the cerebellum, brainstem and fourth  ventricle, is within normal limits.  The third and lateral ventricles, and basal ganglia are unremarkable in appearance.  The cerebral hemispheres are symmetric in appearance, with normal gray- white differentiation.  No mass effect or midline shift is seen.  There is no evidence of fracture; visualized osseous structures are unremarkable in appearance.  The orbits are within normal limits. The paranasal sinuses and mastoid air cells are well-aerated.  No significant soft tissue abnormalities are seen.  IMPRESSION: Unremarkable noncontrast CT of the head.   Original Report Authenticated By: Tonia Ghent, M.D.      No diagnosis found.   Date: 08/09/2012  Rate: 66  Rhythm: normal sinus rhythm  QRS Axis: normal  Intervals: normal  ST/T Wave abnormalities: normal  Conduction Disutrbances: none  Narrative Interpretation: unremarkable     MDM  Patient has normal physical exam.  Advised to take aspirin daily. She will follow up with her primary care Dr. For further workup. Discussed the possibility of TIA and cardiac related pain.        Donnetta Hutching, MD 08/09/12 [phone]

## 2012-08-09 NOTE — ED Notes (Signed)
PT states understanding of discharge instructions 

## 2012-10-05 ENCOUNTER — Ambulatory Visit (INDEPENDENT_AMBULATORY_CARE_PROVIDER_SITE_OTHER): Payer: No Typology Code available for payment source | Admitting: Internal Medicine

## 2012-10-05 ENCOUNTER — Encounter: Payer: Self-pay | Admitting: Internal Medicine

## 2012-10-05 VITALS — BP 140/92 | HR 79 | Temp 98.2°F | Ht 70.5 in | Wt 270.0 lb

## 2012-10-05 DIAGNOSIS — E785 Hyperlipidemia, unspecified: Secondary | ICD-10-CM

## 2012-10-05 DIAGNOSIS — I10 Essential (primary) hypertension: Secondary | ICD-10-CM

## 2012-10-05 DIAGNOSIS — E119 Type 2 diabetes mellitus without complications: Secondary | ICD-10-CM

## 2012-10-05 DIAGNOSIS — R252 Cramp and spasm: Secondary | ICD-10-CM

## 2012-10-05 DIAGNOSIS — K589 Irritable bowel syndrome without diarrhea: Secondary | ICD-10-CM

## 2012-10-05 DIAGNOSIS — Z833 Family history of diabetes mellitus: Secondary | ICD-10-CM

## 2012-10-05 DIAGNOSIS — E559 Vitamin D deficiency, unspecified: Secondary | ICD-10-CM | POA: Insufficient documentation

## 2012-10-05 LAB — HEPATIC FUNCTION PANEL
ALT: 20 U/L (ref 0–35)
AST: 15 U/L (ref 0–37)
Albumin: 3.7 g/dL (ref 3.5–5.2)
Alkaline Phosphatase: 50 U/L (ref 39–117)
Bilirubin, Direct: 0 mg/dL (ref 0.0–0.3)
Total Bilirubin: 0.6 mg/dL (ref 0.3–1.2)
Total Protein: 7.8 g/dL (ref 6.0–8.3)

## 2012-10-05 LAB — LIPID PANEL
Cholesterol: 206 mg/dL — ABNORMAL HIGH (ref 0–200)
HDL: 73.9 mg/dL (ref >39.00)
Total CHOL/HDL Ratio: 3
Triglycerides: 76 mg/dL (ref 0.0–149.0)
VLDL: 15.2 mg/dL (ref 0.0–40.0)

## 2012-10-05 LAB — BASIC METABOLIC PANEL
BUN: 12 mg/dL (ref 6–23)
CO2: 32 mEq/L (ref 19–32)
Calcium: 9.8 mg/dL (ref 8.4–10.5)
Chloride: 102 mEq/L (ref 96–112)
Creatinine, Ser: 0.8 mg/dL (ref 0.4–1.2)
GFR: 105.1 mL/min (ref >60.00)
Glucose, Bld: 95 mg/dL (ref 70–99)
Potassium: 4.6 mEq/L (ref 3.5–5.1)
Sodium: 139 mEq/L (ref 135–145)

## 2012-10-05 LAB — T4, FREE: Free T4: 0.82 ng/dL (ref 0.60–1.60)

## 2012-10-05 LAB — MAGNESIUM: Magnesium: 2 mg/dL (ref 1.5–2.5)

## 2012-10-05 LAB — TSH: TSH: 0.92 u[IU]/mL (ref 0.35–5.50)

## 2012-10-05 LAB — HEMOGLOBIN A1C: Hgb A1c MFr Bld: 6.5 % (ref 4.6–6.5)

## 2012-10-05 LAB — LDL CHOLESTEROL, DIRECT: Direct LDL: 119.6 mg/dL

## 2012-10-05 NOTE — Progress Notes (Signed)
Chief Complaint  Patient presents with  . Establish Care    Would like to discuss leg cramps, vitamin d level, A1C, liver function and thyroid.    HPI: Patient comes in as new patient visit . Previous care was through Dr. Cyndia Bent. Before that Dr. Smith Mince. She was seen by Dr. Huston Foley in the last year or so and felt to have abdominal problems from medications for diabetes prediabetes. Has ongoing problems conditions and medication management. Today would like to discuss the above things.   BP :   Has been on meds for 20 + years  And doing ok . Cannot take ACE inhibitors  Leg cramps  Terrible for 2 weeks  Remote hx of same  no change in medication.  Last  Preventive in fall   And last a1c in February . 5.7  was given metformin  2000 mg per day after going off and Januvia. and only taking 1000mg . he can she thought 2000 might be too much. She's been on this for about 2 months For 2 months .  Dr Juanda Chance  ascertained to get her recurrent abdominal pain was from Januvia affecting her pancreas   since she has stopped [place] her abdominal pain is improved.   Has history of a very low vitamin D level once to get it checked.  Was placed on Lipitor at some time but heard a lot of information on TV about it causing diabetes would be willing to try different cholesterol medicine but has questions about why she should be on it. She's currently not taking it. I do not have the laboratory tests nor the dosage of which she was given.  Works at night  Taking   At breakfast  1` pm .    About 7 am. Takes all meds.   Sleep  Broken   About 7 30 - 11 - 12   Gets up to eat breakfast and then sleep till 5 and work  7;55 pm.  - 430 am.  20 years.   ROS: See pertinent positives and negatives per HPI. She's had a problem with right sciatica. No unusual infections numbness in her feet. GERD and medications  Allergy  ;acei shrimp    Dust mite prob. Dust grass pollen.   Seen 2 weeks   Ago.  Considering shots.,    Through Dr. Riviera Callas states that she gets sinus headaches which are pain from her neck up most days. Denies any significant congestion with this however  Remote hx of migraines   Denies specific sleep apnea.  Was evaluated by cardiology in the past for palpitations that were not felt to be serious and better when stopping caffeine.  Habits no tobacco significant alcohol some caffeine decreasing has cut out so does the will have a half-and-half sweet tea.  Periods   Normal . For her.  Past Medical History  Diagnosis Date  . Diabetes mellitus   . High cholesterol   . IBS (irritable bowel syndrome)   . GERD (gastroesophageal reflux disease)   . Hypertension   . Gastritis   . Fatty liver   . History of varicella   . History of asthma     Family History  Problem Relation Age of Onset  . Colon cancer Neg Hx   . Esophageal cancer Neg Hx   . Stomach cancer Neg Hx   . Rectal cancer Neg Hx   . Prostate cancer Father   . Diabetes Father   . Hypertension Father   .  Diabetes Mother   . Heart disease Mother   . Arthritis Mother   . Hyperlipidemia Mother   . Hypertension Mother   . Parkinson's disease Mother   . Diabetes Paternal Grandmother   . Arthritis Paternal Grandmother   . Hyperlipidemia Paternal Grandmother   . Heart disease Paternal Grandmother   . Dementia Paternal Grandmother   . Hypertension Paternal Grandmother   . Hyperlipidemia Maternal Grandmother   . Hypertension Maternal Grandmother   . Hyperlipidemia Maternal Grandfather   . Hypertension Maternal Grandfather   . Hyperlipidemia Paternal Grandfather   . Hypertension Paternal Grandfather   . Diabetes Paternal Grandfather   . Cholelithiasis Paternal Grandfather   . Thyroid disease Mother     History   Social History  . Marital Status: Single    Spouse Name: N/A    Number of Children: N/A  . Years of Education: N/A   Social History Main Topics  . Smoking status: Never Smoker   . Smokeless tobacco: Never  Used  . Alcohol Use: No  . Drug Use: No  . Sexually Active: None   Other Topics Concern  . None   Social History Narrative   Usually # of hours of sleep per night: 6   3 of people living at your residence? 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   Irregular sleep 7:30 to 11 or 12 and then 4 to 6:30 before she goes to work   Has attended college.   Originally from [place].   G0P0   Abnormal pap refmote last 2012     Outpatient Encounter Prescriptions as of 10/05/2012  Medication Sig Dispense Refill  . Biotin 5000 MCG CAPS Take 1 capsule by mouth daily.      Marland Kitchen EPINEPHrine (EPI-PEN) 0.3 mg/0.3 mL DEVI Inject 0.3 mg into the muscle once as needed. For severe allergic reaction      . metFORMIN (GLUCOPHAGE) 1000 MG tablet Take 1,000 mg by mouth daily with breakfast.       . montelukast (SINGULAIR) 10 MG tablet       . olmesartan (BENICAR) 20 MG tablet Take 1 tablet (20 mg total) by mouth daily.  30 tablet  3  . omeprazole (PRILOSEC) 40 MG capsule Take 40 mg by mouth daily as needed (for acid reflux).      . triamterene-hydrochlorothiazide (MAXZIDE-25) 37.5-25 MG per tablet       . [DISCONTINUED] triamterene (DYRENIUM) 50 MG capsule Take 50 mg by mouth daily.       . [DISCONTINUED] atorvastatin (LIPITOR) 20 MG tablet Take 20 mg by mouth daily.      . [DISCONTINUED] cholecalciferol (VITAMIN D) 1000 UNITS tablet Take 1,000 Units by mouth daily.         No facility-administered encounter medications on file as of 10/05/2012.    EXAM:  BP 140/92  Pulse 79  Temp(Src) 98.2 F (36.8 C) (Oral)  Ht 5' 10.5" (1.791 m)  Wt 270 lb (122.471 kg)  BMI 38.18 kg/m2  SpO2 98%  LMP 09/11/2012  Body mass index is 38.18 kg/(m^2).  GENERAL: vitals reviewed and listed above, alert, oriented, appears well hydrated and in no acute distress pleasant nontoxic well-appearing  HEENT: atraumatic, conjunctiva  clear, no obvious  abnormalities on inspection of external nose and ears OP : no lesion edema or exudate   NECK: no obvious masses on inspection palpation no adenopathy no bruits are  heard  LUNGS: clear to auscultation bilaterally, no wheezes, rales or rhonchi, good air movement  CV: HRRR, no clubbing cyanosis or  peripheral edema nl cap refill slight edema at ankles. Abdomen soft without organomegaly guarding or rebound MS: moves all extremities without noticeable focal  abnormality PSYCH: pleasant and cooperative, no obvious depression or anxiety Skin no acute changes Diabetic foot exam ASSESSMENT AND PLAN:  Discussed the following assessment and plan:  DIABETES MELLITUS - Plan: montelukast (SINGULAIR) 10 MG tablet, triamterene-hydrochlorothiazide (MAXZIDE-25) 37.5-25 MG per tablet, HM MAMMOGRAPHY, Basic metabolic panel, Hemoglobin A1c, TSH, T4, free, Magnesium, Hepatic function panel, Vitamin D 25 hydroxy, Lipid panel, Protein / creatinine ratio, urine  HYPERLIPIDEMIA - Discussed plus minus of statins blood sugar affect is a class effect Lipitor has good data for cardiovascular protection as well as Crestor. Consider other tria - Plan: montelukast (SINGULAIR) 10 MG tablet, triamterene-hydrochlorothiazide (MAXZIDE-25) 37.5-25 MG per tablet, HM MAMMOGRAPHY, Basic metabolic panel, Hemoglobin A1c, TSH, T4, free, Magnesium, Hepatic function panel, Vitamin D 25 hydroxy, Lipid panel  HYPERTENSION - Plan: montelukast (SINGULAIR) 10 MG tablet, triamterene-hydrochlorothiazide (MAXZIDE-25) 37.5-25 MG per tablet, HM MAMMOGRAPHY, Basic metabolic panel, Hemoglobin A1c, TSH, T4, free, Magnesium, Hepatic function panel, Vitamin D 25 hydroxy, Lipid panel  Unspecified vitamin D deficiency - Plan: montelukast (SINGULAIR) 10 MG tablet, triamterene-hydrochlorothiazide (MAXZIDE-25) 37.5-25 MG per tablet, HM MAMMOGRAPHY, Basic metabolic panel, Hemoglobin A1c, TSH, T4, free, Magnesium, Hepatic function panel, Vitamin D 25 hydroxy,  Lipid panel  Irritable bowel syndrome  OBESITY-MORBID (>100')  Family history of diabetes mellitus  Leg cramps We'll get laboratory testing today to review and have her come back for further recommendations. At this point stay on 1000 mg of metformin as it apparently tolerated pretty well. We'll recheck blood pressure at that time she should attend monitoring oral intake and sleep as this is a taking her health. Consider Weight Watchers. She probably should be on a statin medication Will re\re address at her followup. -Patient advised to return or notify health care team  if symptoms worsen or persist or new concerns arise.  Patient Instructions  Calendar you sleep and  Intake and activity level to help control life style factors . Cut out sweet drinks ( even sweet tea) .  Will notify you  of labs when available.       Neta Mends. Derrel Moore M.D.  Health Maintenance  Topic Date Due  . Tetanus/tdap  03/11/1985  . Influenza Vaccine  12/21/2012  . Pap Smear  10/30/2013   Health Maintenance Review Uncertain of last TD colonoscopy 34 years ago no flu vaccine

## 2012-10-05 NOTE — Patient Instructions (Signed)
Calendar you sleep and  Intake and activity level to help control life style factors . Cut out sweet drinks ( even sweet tea) .  Will notify you  of labs when available.

## 2012-10-06 LAB — PROTEIN / CREATININE RATIO, URINE
Creatinine, Urine: 80.4 mg/dL
Protein Creatinine Ratio: 0.04 (ref <0.15)
Total Protein, Urine: 3 mg/dL

## 2012-10-06 LAB — VITAMIN D 25 HYDROXY (VIT D DEFICIENCY, FRACTURES): Vit D, 25-Hydroxy: 29 ng/mL — ABNORMAL LOW (ref 30–89)

## 2012-10-28 ENCOUNTER — Other Ambulatory Visit: Payer: Self-pay | Admitting: Internal Medicine

## 2012-10-29 NOTE — Telephone Encounter (Signed)
Are you taking over this medication for Dr. Antoine Poche?

## 2012-10-29 NOTE — Telephone Encounter (Signed)
Ok to take over this medication   Please send a note to dr [employer] office  To communicate this .

## 2012-11-09 ENCOUNTER — Ambulatory Visit: Payer: No Typology Code available for payment source | Admitting: Internal Medicine

## 2013-01-29 ENCOUNTER — Encounter: Payer: Self-pay | Admitting: Internal Medicine

## 2013-01-29 ENCOUNTER — Ambulatory Visit (INDEPENDENT_AMBULATORY_CARE_PROVIDER_SITE_OTHER): Payer: No Typology Code available for payment source | Admitting: Internal Medicine

## 2013-01-29 VITALS — BP 136/84 | HR 82 | Temp 97.8°F | Wt 274.0 lb

## 2013-01-29 DIAGNOSIS — K219 Gastro-esophageal reflux disease without esophagitis: Secondary | ICD-10-CM

## 2013-01-29 DIAGNOSIS — R079 Chest pain, unspecified: Secondary | ICD-10-CM

## 2013-01-29 MED ORDER — OMEPRAZOLE 40 MG PO CPDR: 40.0000 mg | DELAYED_RELEASE_CAPSULE | Freq: Every day | ORAL | Status: DC

## 2013-01-29 NOTE — Progress Notes (Signed)
Chief Complaint  Patient presents with  . Chest Pain    HPI: Patient comes in today for SDA for  new problem evaluation. 3 day hx of   Intermittent CP comes mid lower and under both breasts to back.  Comes and goes.  For 3 days. First  Time after awakening. Pain is throbbing and sharp in middle.  no other assoc sx sweating nausea vomiting  Coughing  Acid ?   Allergies  flaring. No new meds.  No wheezing.  rmote hx of same not as bad.  Lasts  Less than 2 minutes.  Had run out of her omeprazole and last dose 3 days ago .  Has cough when lays down seems like her acid reflux.  ROS: See pertinent positives and negatives per HPI. No nfever syncope  Pleuritic pain or rash   . Bra sensitive at times   Past Medical History  Diagnosis Date  . Diabetes mellitus   . High cholesterol   . IBS (irritable bowel syndrome)   . GERD (gastroesophageal reflux disease)   . Hypertension   . Gastritis   . Fatty liver   . History of varicella   . History of asthma   . Allergic     Family History  Problem Relation Age of Onset  . Colon cancer Neg Hx   . Esophageal cancer Neg Hx   . Stomach cancer Neg Hx   . Rectal cancer Neg Hx   . Prostate cancer Father   . Diabetes Father   . Hypertension Father   . Diabetes Mother   . Heart disease Mother   . Arthritis Mother   . Hyperlipidemia Mother   . Hypertension Mother   . Parkinson's disease Mother   . Diabetes Paternal Grandmother   . Arthritis Paternal Grandmother   . Hyperlipidemia Paternal Grandmother   . Heart disease Paternal Grandmother   . Dementia Paternal Grandmother   . Hypertension Paternal Grandmother   . Hyperlipidemia Maternal Grandmother   . Hypertension Maternal Grandmother   . Hyperlipidemia Maternal Grandfather   . Hypertension Maternal Grandfather   . Hyperlipidemia Paternal Grandfather   . Hypertension Paternal Grandfather   . Diabetes Paternal Grandfather   . Cholelithiasis Paternal Grandfather   . Thyroid disease Mother      History   Social History  . Marital Status: Single    Spouse Name: N/A    Number of Children: N/A  . Years of Education: N/A   Social History Main Topics  . Smoking status: Never Smoker   . Smokeless tobacco: Never Used  . Alcohol Use: No  . Drug Use: No  . Sexual Activity: None   Other Topics Concern  . None   Social History Narrative   Usually # of hours of sleep per night: 6   3 of people living at your residence? 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   Irregular sleep 7:30 to 11 or 12 and then 4 to 6:30 before she goes to work   Has attended college.   Originally from [place].   G0P0   Abnormal pap remote last 2012     Outpatient Encounter Prescriptions as of 01/29/2013  Medication Sig Dispense Refill  . BENICAR 20 MG tablet take 1 tablet by mouth once daily  30 tablet  0  . EPINEPHrine (EPI-PEN) 0.3 mg/0.3 mL  DEVI Inject 0.3 mg into the muscle once as needed. For severe allergic reaction      . metFORMIN (GLUCOPHAGE) 1000 MG tablet Take 1,000 mg by mouth daily with breakfast.       . montelukast (SINGULAIR) 10 MG tablet       . omeprazole (PRILOSEC) 40 MG capsule Take 1 capsule (40 mg total) by mouth daily.  30 capsule  3  . triamterene-hydrochlorothiazide (MAXZIDE-25) 37.5-25 MG per tablet       . [DISCONTINUED] omeprazole (PRILOSEC) 40 MG capsule Take 40 mg by mouth daily as needed (for acid reflux).      . [DISCONTINUED] Biotin 5000 MCG CAPS Take 1 capsule by mouth daily.       No facility-administered encounter medications on file as of 01/29/2013.    EXAM:  BP 136/84  Pulse 82  Temp(Src) 97.8 F (36.6 C) (Oral)  Wt 274 lb (124.286 kg)  BMI 38.75 kg/m2  SpO2 98%  Body mass index is 38.75 kg/(m^2).  GENERAL: vitals reviewed and listed above, alert, oriented, appears well hydrated and in no acute distress HEENT: atraumatic,  conjunctiva  clear, no obvious abnormalities on inspection of external nose and ears OP : no lesion edema orexudate  NECK: no obvious masses on inspection palpation no adenopathy LUNGS: clear to auscultation bilaterally, no wheezes, rales or rhonchi, good air movement CV: HRRR, no clubbing cyanosis or  peripheral edema nl cap refill  MS: moves all extremities without noticeable focal  abnormality Abdomen:  Sof,t normal bowel sounds without hepatosplenomegaly, no guarding rebound or masses no CVA tendernesssore ruq ( old and some epigastric sternum nl no skin changes well healed gb scars . PSYCH: pleasant and cooperative, no obvious depression or anxiety EKG nsr no acute changes  ASSESSMENT AND PLAN:  Discussed the following assessment and plan:  Chest pain - Atypical more upper abdominal suspect acid related off her Prilosec - Plan: EKG 12-Lead  GERD Restart the Prilosec every day one hour before meals close followup warranted contact on-call service or seek other care if alarming symptoms otherwise keep her followup appointment in a few weeks. Also advise she make a followup appointment with Dr. Juanda Chance. We did discuss weight loss as help for her symptoms. Briefly discussed that diet pills are not that helpful and do have risk. At this point tracking lifestyle close followup. -Patient advised to return or notify health care team  if symptoms worsen or persist or new concerns arise.  Patient Instructions  Take omeprazole med every day  . i think the discomfort is gi related  . EKG is normal  Fu with me and dr Juanda Chance  As planned  Tracking to help weight loss.    Neta Mends. Panosh M.D.

## 2013-01-29 NOTE — Patient Instructions (Signed)
Take omeprazole med every day  . i think the discomfort is gi related  . EKG is normal  Fu with me and dr Juanda Chance  As planned  Tracking to help weight loss.

## 2013-02-04 ENCOUNTER — Other Ambulatory Visit: Payer: Self-pay | Admitting: Internal Medicine

## 2013-02-08 ENCOUNTER — Encounter: Payer: Self-pay | Admitting: Internal Medicine

## 2013-02-08 ENCOUNTER — Ambulatory Visit (INDEPENDENT_AMBULATORY_CARE_PROVIDER_SITE_OTHER): Payer: No Typology Code available for payment source | Admitting: Internal Medicine

## 2013-02-08 VITALS — BP 122/84 | HR 72 | Temp 97.5°F | Wt 273.0 lb

## 2013-02-08 DIAGNOSIS — E119 Type 2 diabetes mellitus without complications: Secondary | ICD-10-CM

## 2013-02-08 DIAGNOSIS — I10 Essential (primary) hypertension: Secondary | ICD-10-CM

## 2013-02-08 DIAGNOSIS — K219 Gastro-esophageal reflux disease without esophagitis: Secondary | ICD-10-CM

## 2013-02-08 DIAGNOSIS — T887XXA Unspecified adverse effect of drug or medicament, initial encounter: Secondary | ICD-10-CM

## 2013-02-08 DIAGNOSIS — T50905A Adverse effect of unspecified drugs, medicaments and biological substances, initial encounter: Secondary | ICD-10-CM

## 2013-02-08 DIAGNOSIS — E785 Hyperlipidemia, unspecified: Secondary | ICD-10-CM

## 2013-02-08 DIAGNOSIS — R109 Unspecified abdominal pain: Secondary | ICD-10-CM

## 2013-02-08 DIAGNOSIS — Z23 Encounter for immunization: Secondary | ICD-10-CM

## 2013-02-08 LAB — HEPATIC FUNCTION PANEL
ALT: 19 U/L (ref 0–35)
AST: 15 U/L (ref 0–37)
Albumin: 3.8 g/dL (ref 3.5–5.2)
Alkaline Phosphatase: 53 U/L (ref 39–117)
Bilirubin, Direct: 0.1 mg/dL (ref 0.0–0.3)
Total Bilirubin: 0.6 mg/dL (ref 0.3–1.2)
Total Protein: 7.9 g/dL (ref 6.0–8.3)

## 2013-02-08 LAB — LIPID PANEL
Cholesterol: 213 mg/dL — ABNORMAL HIGH (ref 0–200)
HDL: 65.4 mg/dL (ref >39.00)
Total CHOL/HDL Ratio: 3
Triglycerides: 95 mg/dL (ref 0.0–149.0)
VLDL: 19 mg/dL (ref 0.0–40.0)

## 2013-02-08 LAB — LDL CHOLESTEROL, DIRECT: Direct LDL: 127.1 mg/dL

## 2013-02-08 LAB — BASIC METABOLIC PANEL
BUN: 12 mg/dL (ref 6–23)
CO2: 32 mEq/L (ref 19–32)
Calcium: 9.5 mg/dL (ref 8.4–10.5)
Chloride: 98 mEq/L (ref 96–112)
Creatinine, Ser: 0.7 mg/dL (ref 0.4–1.2)
GFR: 115.39 mL/min (ref >60.00)
Glucose, Bld: 91 mg/dL (ref 70–99)
Potassium: 4.5 mEq/L (ref 3.5–5.1)
Sodium: 138 mEq/L (ref 135–145)

## 2013-02-08 LAB — HEMOGLOBIN A1C: Hgb A1c MFr Bld: 6.6 % — ABNORMAL HIGH (ref 4.6–6.5)

## 2013-02-08 LAB — MICROALBUMIN / CREATININE URINE RATIO
Creatinine,U: 81.5 mg/dL
Microalb Creat Ratio: 0.2 mg/g (ref 0.0–30.0)
Microalb, Ur: 0.2 mg/dL (ref 0.0–1.9)

## 2013-02-08 LAB — LIPASE: Lipase: 28 U/L (ref 11.0–59.0)

## 2013-02-08 MED ORDER — PRAVASTATIN SODIUM 20 MG PO TABS: 20.0000 mg | ORAL_TABLET | Freq: Every day | ORAL | Status: DC

## 2013-02-08 NOTE — Patient Instructions (Signed)
Set a goal.  Of weight loss 5# in a month  For now.  Trial of pravastatin  Less likely to cause  Side effect problems.  Find out from last provider pneumonia vaccine and date.   Make appt with dr Juanda Chance about the right side abd pain. Opinion. Will notify you  of labs when available. Get your flu vaccine and send [place] info.   Preventive visit in 4- 6 months or as needed.

## 2013-02-08 NOTE — Progress Notes (Signed)
Chief Complaint  Patient presents with  . Follow-up  . Hyperlipidemia  . Diabetes    HPI: Patient comes in today for follow up of  multiple medical problems.    Dm  Not checked   Recently metformin  1000 mg qd   Bp  : not checked   Allergies  :  To get shots anytime.   Stomach pain right and to the back   Still happening   Not as bad but hanst seen dr Juanda Chance yet.   Lipids Didn't tolerated lipitor very well.   So stopped ot  ROS: See pertinent positives and negatives per HPI. No cp sob fever numbness   Past Medical History  Diagnosis Date  . Diabetes mellitus   . High cholesterol   . IBS (irritable bowel syndrome)   . GERD (gastroesophageal reflux disease)   . Hypertension   . Gastritis   . Fatty liver   . History of varicella   . History of asthma   . Allergic     Family History  Problem Relation Age of Onset  . Colon cancer Neg Hx   . Esophageal cancer Neg Hx   . Stomach cancer Neg Hx   . Rectal cancer Neg Hx   . Prostate cancer Father   . Diabetes Father   . Hypertension Father   . Diabetes Mother   . Heart disease Mother   . Arthritis Mother   . Hyperlipidemia Mother   . Hypertension Mother   . Parkinson's disease Mother   . Diabetes Paternal Grandmother   . Arthritis Paternal Grandmother   . Hyperlipidemia Paternal Grandmother   . Heart disease Paternal Grandmother   . Dementia Paternal Grandmother   . Hypertension Paternal Grandmother   . Hyperlipidemia Maternal Grandmother   . Hypertension Maternal Grandmother   . Hyperlipidemia Maternal Grandfather   . Hypertension Maternal Grandfather   . Hyperlipidemia Paternal Grandfather   . Hypertension Paternal Grandfather   . Diabetes Paternal Grandfather   . Cholelithiasis Paternal Grandfather   . Thyroid disease Mother     History   Social History  . Marital Status: Single    Spouse Name: N/A    Number of Children: N/A  . Years of Education: N/A   Social History Main Topics  . Smoking  status: Never Smoker   . Smokeless tobacco: Never Used  . Alcohol Use: No  . Drug Use: No  . Sexual Activity: None   Other Topics Concern  . None   Social History Narrative   Usually # of hours of sleep per night: 6   3 of people living at your residence? 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   Irregular sleep 7:30 to 11 or 12 and then 4 to 6:30 before she goes to work   Has attended college.   Originally from [place].   G0P0   Abnormal pap remote last 2012     Outpatient Encounter Prescriptions as of 02/08/2013  Medication Sig Dispense Refill  . BENICAR 20 MG tablet take 1 tablet by mouth once daily  30 tablet  0  . EPINEPHrine (EPI-PEN) 0.3 mg/0.3 mL DEVI Inject 0.3 mg into the muscle once as needed. For severe allergic reaction      . metFORMIN (GLUCOPHAGE) 1000 MG tablet Take 1,000 mg by mouth daily with breakfast.       .  montelukast (SINGULAIR) 10 MG tablet       . omeprazole (PRILOSEC) 40 MG capsule Take 1 capsule (40 mg total) by mouth daily.  30 capsule  3  . triamterene-hydrochlorothiazide (MAXZIDE-25) 37.5-25 MG per tablet take 1 tablet by mouth once daily  30 tablet  5  . pravastatin (PRAVACHOL) 20 MG tablet Take 1 tablet (20 mg total) by mouth daily.  90 tablet  3   No facility-administered encounter medications on file as of 02/08/2013.    EXAM:  BP 122/84  Pulse 72  Temp(Src) 97.5 F (36.4 C) (Oral)  Wt 273 lb (123.832 kg)  BMI 38.6 kg/m2  SpO2 98%  Body mass index is 38.6 kg/(m^2).  GENERAL: vitals reviewed and listed above, alert, oriented, appears well hydrated and in no acute distress  HEENT: atraumatic, conjunctiva  clear, no obvious abnormalities on inspection of external nose and ears OP : no lesion edema or exudate  NECK: no obvious masses on inspection palpation  LUNGS: clear to auscultation bilaterally, no wheezes, rales or  rhonchi, good air movement abd right side tenderness no g r or masses  CV: HRRR, no clubbing cyanosis or  peripheral edema nl cap refill   MS: moves all extremities without noticeable focal  abnormality Foot exam normal  PSYCH: pleasant and cooperative, no obvious depression or anxiety Lab Results  Component Value Date   WBC 7.6 06/02/2012   HGB 13.5 06/02/2012   HCT 40.8 06/02/2012   PLT 353.0 06/02/2012   GLUCOSE 91 02/08/2013   CHOL 213* 02/08/2013   TRIG 95.0 02/08/2013   HDL 65.40 02/08/2013   LDLDIRECT 127.1 02/08/2013   ALT 19 02/08/2013   AST 15 02/08/2013   NA 138 02/08/2013   K 4.5 02/08/2013   CL 98 02/08/2013   CREATININE 0.7 02/08/2013   BUN 12 02/08/2013   CO2 32 02/08/2013   TSH 0.92 10/05/2012   HGBA1C 6.6* 02/08/2013   MICROALBUR 0.2 02/08/2013    ASSESSMENT AND PLAN:  Discussed the following assessment and plan:  DIABETES MELLITUS - Plan: Basic metabolic panel, Hemoglobin A1c, Hepatic function panel, Lipid panel, Microalbumin / creatinine urine ratio  HYPERLIPIDEMIA - se lipitor try pravachol  - Plan: Basic metabolic panel, Hemoglobin A1c, Hepatic function panel, Lipid panel, Microalbumin / creatinine urine ratio  HYPERTENSION - Plan: Basic metabolic panel, Hemoglobin A1c, Hepatic function panel, Lipid panel, Microalbumin / creatinine urine ratio  GERD - Plan: Basic metabolic panel, Hemoglobin A1c, Hepatic function panel, Lipid panel, Microalbumin / creatinine urine ratio  Abdominal pain, unspecified site - Plan: Lipase  Need for prophylactic vaccination and inoculation against influenza - Plan: Flu Vaccine QUAD 36+ mos PF IM (Fluarix)  Need for prophylactic vaccination against Streptococcus pneumoniae (pneumococcus) - Plan: Pneumococcal conjugate vaccine 13-valent less than 5yo IM  Medication side effect, initial encounter - lipitor  Foot exam  ? prevnar  -Patient advised to return or notify health care team  if symptoms worsen or persist or new  concerns arise.  Patient Instructions  Set a goal.  Of weight loss 5# in a month  For now.  Trial of pravastatin  Less likely to cause  Side effect problems.  Find out from last provider pneumonia vaccine and date.   Make appt with dr Juanda Chance about the right side abd pain. Opinion. Will notify you  of labs when available. Get your flu vaccine and send [place] info.   Preventive visit in 4- 6 months or as needed.    Maria Sharp. Maria Sharp  M.D.   

## 2013-02-09 ENCOUNTER — Telehealth: Payer: Self-pay | Admitting: Internal Medicine

## 2013-02-09 NOTE — Telephone Encounter (Signed)
Patient states she has been having RUQ pain and GERD. She saw her PCP and was told to f/u with Dr. Juanda Chance. Offered OV with extender this week or OV with Dr. Juanda Chance on 02/23/13. Patient prefers to see Dr. Juanda Chance. Scheduled on 02/23/13 at 9:00 AM.

## 2013-02-10 DIAGNOSIS — T887XXA Unspecified adverse effect of drug or medicament, initial encounter: Secondary | ICD-10-CM | POA: Insufficient documentation

## 2013-02-10 DIAGNOSIS — E669 Obesity, unspecified: Secondary | ICD-10-CM | POA: Insufficient documentation

## 2013-02-12 ENCOUNTER — Other Ambulatory Visit: Payer: Self-pay | Admitting: Obstetrics and Gynecology

## 2013-02-12 DIAGNOSIS — Z1231 Encounter for screening mammogram for malignant neoplasm of breast: Secondary | ICD-10-CM

## 2013-02-17 ENCOUNTER — Other Ambulatory Visit: Payer: Self-pay | Admitting: Family Medicine

## 2013-02-17 DIAGNOSIS — E119 Type 2 diabetes mellitus without complications: Secondary | ICD-10-CM

## 2013-02-17 DIAGNOSIS — E785 Hyperlipidemia, unspecified: Secondary | ICD-10-CM

## 2013-02-23 ENCOUNTER — Ambulatory Visit (INDEPENDENT_AMBULATORY_CARE_PROVIDER_SITE_OTHER): Payer: No Typology Code available for payment source | Admitting: Internal Medicine

## 2013-02-23 ENCOUNTER — Encounter: Payer: Self-pay | Admitting: Internal Medicine

## 2013-02-23 VITALS — BP 120/74 | HR 66 | Ht 70.75 in | Wt 270.2 lb

## 2013-02-23 DIAGNOSIS — R110 Nausea: Secondary | ICD-10-CM

## 2013-02-23 DIAGNOSIS — R1011 Right upper quadrant pain: Secondary | ICD-10-CM

## 2013-02-23 MED ORDER — DICYCLOMINE HCL 20 MG PO TABS: ORAL_TABLET | ORAL | Status: DC

## 2013-02-23 MED ORDER — PROMETHAZINE HCL 25 MG PO TABS: 25.0000 mg | ORAL_TABLET | Freq: Four times a day (QID) | ORAL | Status: DC | PRN

## 2013-02-23 NOTE — Progress Notes (Signed)
EMMAGRACE RUNKEL Jun 06, 1965 MRN [id number]   History of Present Illness:  This is a 47 year old African American female with irritable bowel syndrome and intermittent episodes of right upper quadrant abdominal pain. The last episode occurred 2 weeks ago. She had crampy abdominal pain and diarrhea which lasted almost a week. She usually misses work but she was able to return to work last time. An extensive workup has included an upper endoscopy in February 2014 which showed  CLO negative antral gastritis, an upper abdominal ultrasound in February 2014 showed post cholecystectomy. A colonoscopy in November 2009 was normal. A CT scan of the abdomen in 2010 showed mild hepatomegaly. She is a diabetic who takes metformin 1,000 mg daily. In between the episodes, patient reports normal bowel habits. At the time of the episodes, she was not taking Prilosec but since then has started taking it at 40 mg daily. She feels about 80% improved.   Past Medical History  Diagnosis Date  . Diabetes mellitus   . High cholesterol   . IBS (irritable bowel syndrome)   . GERD (gastroesophageal reflux disease)   . Hypertension   . Gastritis   . Fatty liver   . History of varicella   . History of asthma   . Allergic    Past Surgical History  Procedure Laterality Date  . Cholecystectomy      reports that she has never smoked. She has never used smokeless tobacco. She reports that she does not drink alcohol or use illicit drugs. family history includes Arthritis in her mother and paternal grandmother; Cholelithiasis in her paternal grandfather; Dementia in her paternal grandmother; Diabetes in her father, mother, paternal grandfather, and paternal grandmother; Heart disease in her mother and paternal grandmother; Hyperlipidemia in her maternal grandfather, maternal grandmother, mother, paternal grandfather, and paternal grandmother; Hypertension in her father, maternal grandfather, maternal grandmother, mother,  paternal grandfather, and paternal grandmother; Parkinson's disease in her mother; Prostate cancer in her father; Thyroid disease in her mother. There is no history of Colon cancer, Esophageal cancer, Stomach cancer, or Rectal cancer. Allergies  Allergen Reactions  . Ace Inhibitors Anaphylaxis  . Shellfish Allergy Anaphylaxis and Other (See Comments)    Only shrimp  . Januvia [Sitagliptin] Other (See Comments)    Recurrent abdominal pain with elevated pancreas enzymes  . Lipitor [Atorvastatin]         Review of Systems: Denies fever rectal bleeding  The remainder of the 10 point ROS is negative except as outlined in H&P   Physical Exam: General appearance  Well developed, in no distress. Eyes- non icteric. HEENT nontraumatic, normocephalic. Mouth no lesions, tongue papillated, no cheilosis. Neck supple without adenopathy, thyroid not enlarged, no carotid bruits, no JVD. Lungs Clear to auscultation bilaterally. Cor normal S1, normal S2, regular rhythm, no murmur,  quiet precordium. Abdomen: Soft with tenderness along the right costal margin. Liver edge at costal margin. No ascites. No hepatomegaly. Lower abdomen unremarkable. Rectal: Not done. Extremities no pedal edema. Skin no lesions. Neurological alert and oriented x 3. Psychological normal mood and affect.  Assessment and Plan:  Problem #42 47 year old [place] American female with episodes of right upper quadrant abdominal pain combined with diarrhea and at times nausea or vomiting. She is a diabetic. She had a prior cholecystectomy. The diarrhea may be a result of bile overflow as well as irritable bowel syndrome or a drug effect of metformin. Her lab work in Dr [employer] office was normal. She is up-to-date on her endoscopy as well  as colonoscopy. We will obtain a gastric emptying scan to rule out gastroparesis. She will continue to take Prilosec on a daily basis and I will start her on Bentyl 20 mg on an as necessary basis for  crampy abdominal pain. We will also refill her Phenergan 25 mg. She has an FMLA leave.   02/23/2013 [name]

## 2013-02-23 NOTE — Patient Instructions (Signed)
We have sent the following medications to your pharmacy for you to pick up at your convenience: Phenergan Bentyl  Please continue Prilosec.  You have been scheduled for a gastric emptying scan at [facility] on 03/08/13 at 7:30 am. Please arrive at least 15 minutes prior to your appointment for registration. Please make certain not to have anything to eat or drink after midnight the night before your test. Hold all stomach medications (ex: Zofran, phenergan, Reglan) 48 hours prior to your test. If you need to reschedule your appointment, please contact radiology scheduling at [phone]. _____________________________________________________________________ A gastric-emptying study measures how long it takes for food to move through your stomach. There are several ways to measure stomach emptying. In the most common test, you eat food that contains a small amount of radioactive material. A scanner that detects the movement of the radioactive material is placed over your abdomen to monitor the rate at which food leaves your stomach. This test normally takes about 2 hours to complete. _____________________________________________________________________  CC: Dr Fabian Sharp

## 2013-03-04 ENCOUNTER — Other Ambulatory Visit: Payer: Self-pay | Admitting: Obstetrics and Gynecology

## 2013-03-08 ENCOUNTER — Encounter (HOSPITAL_COMMUNITY)
Admission: RE | Admit: 2013-03-08 | Discharge: 2013-03-08 | Disposition: A | Discharge status: Home / Self Care | Payer: No Typology Code available for payment source | Source: Ambulatory Visit | Attending: Internal Medicine | Admitting: Internal Medicine

## 2013-03-08 ENCOUNTER — Other Ambulatory Visit: Payer: Self-pay | Admitting: *Deleted

## 2013-03-08 ENCOUNTER — Encounter (HOSPITAL_COMMUNITY): Payer: Self-pay

## 2013-03-08 DIAGNOSIS — K3184 Gastroparesis: Secondary | ICD-10-CM

## 2013-03-08 DIAGNOSIS — R110 Nausea: Secondary | ICD-10-CM | POA: Insufficient documentation

## 2013-03-08 MED ORDER — TECHNETIUM TC 99M SULFUR COLLOID
2.1000 | Freq: Once | INTRAVENOUS | Status: AC | PRN
  Administered 2013-03-08: 2.1 via ORAL

## 2013-03-08 MED ORDER — METOCLOPRAMIDE HCL 5 MG PO TABS: 5.0000 mg | ORAL_TABLET | Freq: Three times a day (TID) | ORAL | Status: DC

## 2013-03-12 ENCOUNTER — Encounter: Payer: Self-pay | Admitting: *Deleted

## 2013-05-03 LAB — HM DIABETES EYE EXAM

## 2013-05-12 ENCOUNTER — Encounter: Payer: Self-pay | Admitting: Internal Medicine

## 2013-05-18 ENCOUNTER — Ambulatory Visit: Payer: No Typology Code available for payment source | Admitting: Internal Medicine

## 2013-05-30 ENCOUNTER — Other Ambulatory Visit: Payer: Self-pay | Admitting: Internal Medicine

## 2013-05-31 NOTE — Telephone Encounter (Signed)
Dr. Olevia Perches took the pt off Januvia due to stomach pain and placed her on metformin.  Pt then established with Dr. Regis Bill.  Will fill for 1 month until she is seen.

## 2013-05-31 NOTE — Telephone Encounter (Signed)
Pt has appt 06-14-13

## 2013-06-07 ENCOUNTER — Other Ambulatory Visit (INDEPENDENT_AMBULATORY_CARE_PROVIDER_SITE_OTHER): Payer: No Typology Code available for payment source

## 2013-06-07 DIAGNOSIS — E785 Hyperlipidemia, unspecified: Secondary | ICD-10-CM

## 2013-06-07 DIAGNOSIS — E119 Type 2 diabetes mellitus without complications: Secondary | ICD-10-CM

## 2013-06-07 DIAGNOSIS — E559 Vitamin D deficiency, unspecified: Secondary | ICD-10-CM

## 2013-06-07 LAB — LIPID PANEL
Cholesterol: 184 mg/dL (ref 0–200)
HDL: 51.2 mg/dL (ref >39.00)
LDL Cholesterol: 107 mg/dL — ABNORMAL HIGH (ref 0–99)
Total CHOL/HDL Ratio: 4
Triglycerides: 129 mg/dL (ref 0.0–149.0)
VLDL: 25.8 mg/dL (ref 0.0–40.0)

## 2013-06-07 LAB — HEMOGLOBIN A1C: Hgb A1c MFr Bld: 6.7 % — ABNORMAL HIGH (ref 4.6–6.5)

## 2013-06-08 LAB — VITAMIN D 25 HYDROXY (VIT D DEFICIENCY, FRACTURES): Vit D, 25-Hydroxy: 32 ng/mL (ref 30–89)

## 2013-06-14 ENCOUNTER — Ambulatory Visit (INDEPENDENT_AMBULATORY_CARE_PROVIDER_SITE_OTHER): Payer: No Typology Code available for payment source | Admitting: Internal Medicine

## 2013-06-14 ENCOUNTER — Encounter: Payer: Self-pay | Admitting: Internal Medicine

## 2013-06-14 VITALS — BP 144/80 | HR 72 | Temp 98.0°F | Ht 70.25 in | Wt 278.0 lb

## 2013-06-14 DIAGNOSIS — I10 Essential (primary) hypertension: Secondary | ICD-10-CM

## 2013-06-14 DIAGNOSIS — Z91199 Patient's noncompliance with other medical treatment and regimen due to unspecified reason: Secondary | ICD-10-CM | POA: Insufficient documentation

## 2013-06-14 DIAGNOSIS — Z23 Encounter for immunization: Secondary | ICD-10-CM

## 2013-06-14 DIAGNOSIS — R51 Headache: Secondary | ICD-10-CM

## 2013-06-14 DIAGNOSIS — E785 Hyperlipidemia, unspecified: Secondary | ICD-10-CM

## 2013-06-14 DIAGNOSIS — R519 Headache, unspecified: Secondary | ICD-10-CM | POA: Insufficient documentation

## 2013-06-14 DIAGNOSIS — E559 Vitamin D deficiency, unspecified: Secondary | ICD-10-CM

## 2013-06-14 DIAGNOSIS — Z0000 Encounter for general adult medical examination without abnormal findings: Secondary | ICD-10-CM

## 2013-06-14 DIAGNOSIS — M542 Cervicalgia: Secondary | ICD-10-CM

## 2013-06-14 DIAGNOSIS — Z9119 Patient's noncompliance with other medical treatment and regimen: Secondary | ICD-10-CM | POA: Insufficient documentation

## 2013-06-14 DIAGNOSIS — E119 Type 2 diabetes mellitus without complications: Secondary | ICD-10-CM

## 2013-06-14 MED ORDER — TRIAMTERENE-HCTZ 37.5-25 MG PO TABS: ORAL_TABLET | ORAL | Status: DC

## 2013-06-14 MED ORDER — OMEPRAZOLE 40 MG PO CPDR: 40.0000 mg | DELAYED_RELEASE_CAPSULE | Freq: Every day | ORAL | Status: DC

## 2013-06-14 MED ORDER — METFORMIN HCL 1000 MG PO TABS: ORAL_TABLET | ORAL | Status: DC

## 2013-06-14 MED ORDER — OLMESARTAN MEDOXOMIL 20 MG PO TABS: ORAL_TABLET | ORAL | Status: DC

## 2013-06-14 MED ORDER — AMOXICILLIN 500 MG PO CAPS: 500.0000 mg | ORAL_CAPSULE | Freq: Three times a day (TID) | ORAL | Status: DC

## 2013-06-14 MED ORDER — METHOCARBAMOL 750 MG PO TABS: 750.0000 mg | ORAL_TABLET | Freq: Four times a day (QID) | ORAL | Status: DC | PRN

## 2013-06-14 NOTE — Progress Notes (Signed)
Chief Complaint  Patient presents with  . Annual Exam    neck pain  ,face pain ,med refills   . Diabetes  . Hyperlipidemia  . Hypertension    HPI: Patient comes in today for Preventive Health Care visit  And fu multiple disease states  Complains of having face pain for a week or 2 occasionally when moving head feels dizzy  Nasal spray. Samples .    Off an on face pain. She did have a sinus infection associated fever usually sees allergist but he is here to be seen here  Also woke with left neck pain radiating down the upper chest and worse when she moves her head a certain way. Did take some Advil Aleve-type medicine states that pain is difficult. Denies any specific injury.  Seen by GI Dr. Olevia Perches think she has gastroparesis was given Reglan but decided not to take it concern about side effects. Going to nutritionist to help.  Diabetes taking her metformin says no significant side effect.  Blood pressure taking medication per patient. Needs a refill on Benicar and Dyazide.  Cholesterol lipids on pravastatin and fax from [place] that medication is not adhered to she states she takes it every few days just forgets to 9 side effects.  Asks about vitamin D level ordered for this time.  Takes vitamins.  Health Maintenance  Topic Date Due  . Pneumococcal Polysaccharide Vaccine (##1) 03/11/1968  . Foot Exam  03/11/1976  . Influenza Vaccine  11/20/2013  . Hemoglobin A1c  12/05/2013  . Ophthalmology Exam  05/03/2014  . Pap Smear  03/04/2016  . Tetanus/tdap  06/15/2023   Health Maintenance Review   ROS:  GEN/ HEENT: No fever, significant weight changes sweats headaches vision problems hearing changes, CV/ PULM; No chest pain shortness of breath cough, syncope,edema  change in exercise tolerance. GI /GU: No adominal pain, vomiting, change in bowel habits. No blood in the stool. No significant GU symptoms. SKIN/HEME: ,no acute skin rashes suspicious lesions or bleeding.  No lymphadenopathy, nodules, masses.  NEURO/ PSYCH:  No neurologic signs such as weakness numbness. No depression anxiety. IMM/ Allergy: No unusual infections.  Allergy .   REST of 12 system review negative except as per HPI   Past Medical History  Diagnosis Date  . Diabetes mellitus   . High cholesterol   . IBS (irritable bowel syndrome)   . GERD (gastroesophageal reflux disease)   . Hypertension   . Gastritis   . Fatty liver   . History of varicella   . History of asthma   . Allergic   . Gastroparesis     Family History  Problem Relation Age of Onset  . Colon cancer Neg Hx   . Esophageal cancer Neg Hx   . Stomach cancer Neg Hx   . Rectal cancer Neg Hx   . Prostate cancer Father   . Diabetes Father   . Hypertension Father   . Diabetes Mother   . Heart disease Mother   . Arthritis Mother   . Hyperlipidemia Mother   . Hypertension Mother   . Parkinson's disease Mother   . Diabetes Paternal Grandmother   . Arthritis Paternal Grandmother   . Hyperlipidemia Paternal Grandmother   . Heart disease Paternal Grandmother   . Dementia Paternal Grandmother   . Hypertension Paternal Grandmother   . Hyperlipidemia Maternal Grandmother   . Hypertension Maternal Grandmother   . Hyperlipidemia Maternal Grandfather   . Hypertension Maternal Grandfather   . Hyperlipidemia  Paternal Grandfather   . Hypertension Paternal Grandfather   . Diabetes Paternal Grandfather   . Cholelithiasis Paternal Grandfather   . Thyroid disease Mother     History   Social History  . Marital Status: Single    Spouse Name: N/A    Number of Children: N/A  . Years of Education: N/A   Social History Main Topics  . Smoking status: Never Smoker   . Smokeless tobacco: Never Used  . Alcohol Use: No  . Drug Use: No  . Sexual Activity: None   Other Topics Concern  . None   Social History Narrative   Usually # of hours of sleep per night: 6   3 of people living at your residence? 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   Irregular sleep 7:30 to 11 or 12 and then 4 to 6:30 before she goes to work   Has attended college.   Originally from [place]   Abnormal pap remote last 2012     Outpatient Encounter Prescriptions as of 06/14/2013  Medication Sig  . EPINEPHrine (EPI-PEN) 0.3 mg/0.3 mL DEVI Inject 0.3 mg into the muscle once as needed. For severe allergic reaction  . metFORMIN (GLUCOPHAGE) 1000 MG tablet take 1 tablet by mouth twice a day with food  . norgestimate-ethinyl estradiol (ORTHO-CYCLEN,SPRINTEC,PREVIFEM) 0.25-35 MG-MCG tablet Take 1 tablet by mouth daily.  Marland Kitchen olmesartan (BENICAR) 20 MG tablet take 1 tablet by mouth once daily  . omeprazole (PRILOSEC) 40 MG capsule Take 1 capsule (40 mg total) by mouth daily.  . pravastatin (PRAVACHOL) 20 MG tablet Take 1 tablet (20 mg total) by mouth daily.  Marland Kitchen triamterene-hydrochlorothiazide (MAXZIDE-25) 37.5-25 MG per tablet take 1 tablet by mouth once daily  . [DISCONTINUED] BENICAR 20 MG tablet take 1 tablet by mouth once daily  . [DISCONTINUED] metFORMIN (GLUCOPHAGE) 1000 MG tablet take 1 tablet by mouth twice a day with food  . [DISCONTINUED] omeprazole (PRILOSEC) 40 MG capsule Take 1 capsule (40 mg total) by mouth daily.  . [DISCONTINUED] triamterene-hydrochlorothiazide (MAXZIDE-25) 37.5-25 MG per tablet take 1 tablet by mouth once daily  . amoxicillin (AMOXIL) 500 MG capsule Take 1 capsule (500 mg total) by mouth 3 (three) times daily. fo sinusitis  . methocarbamol (ROBAXIN-750) 750 MG tablet Take 1 tablet (750 mg total) by mouth every 6 (six) hours as needed for muscle spasms.  . montelukast (SINGULAIR) 10 MG tablet   . [DISCONTINUED] dicyclomine (BENTYL) 20 MG tablet Take 1 tablet by mouth every 4-6 hours as needed for crampy abdominal pain  . [DISCONTINUED] metoCLOPramide (REGLAN) 5 MG tablet Take 1 tablet (5 mg  total) by mouth 3 (three) times daily before meals.  . [DISCONTINUED] promethazine (PHENERGAN) 25 MG tablet Take 1 tablet (25 mg total) by mouth every 6 (six) hours as needed for nausea or vomiting.    EXAM:  BP 144/80  Pulse 72  Temp(Src) 98 F (36.7 C) (Oral)  Ht 5' 10.25" (1.784 m)  Wt 278 lb (126.1 kg)  BMI 39.62 kg/m2  SpO2 99%  LMP 06/09/2013  Body mass index is 39.62 kg/(m^2).  Physical Exam: Vital signs reviewed [id number] is a well-developed well-nourished alert cooperative   female who appears her stated age in no acute distress.  HEENT: normocephalic atraumatic , Eyes: PERRL EOM's full, conjunctiva clear, Nares: paten,t no deformity discharge  or tenderness., Face is mildly tender she looks allergic. Ears: no deformity EAC's clear TMs with normal landmarks. Mouth: clear OP, no lesions, edema.  Moist mucous membranes. Dentition in adequate repair. NECK:  Range of motion normal but it is somewhat tender in the left trapezius decreased range of motion because of spasm. No obvious masses. without masses, thyromegaly or bruits. CHEST/PULM:  Clear to auscultation and percussion breath sounds equal no wheeze , rales or rhonchi. No chest wall deformities or tenderness. Breast: normal by inspection . No dimpling, discharge, masses, tenderness or discharge . CV: PMI is nondisplaced, S1 S2 no gallops, murmurs, rubs. Peripheral pulses are full without delay.No JVD .  ABDOMEN: Bowel sounds normal nontender  No guard or rebound, no hepato splenomegal no CVA tenderness.  No hernia. Extremtities:  No clubbing cyanosis or edema, no acute joint swelling or redness no focal atrophy NEURO:  Oriented x3, cranial nerves 3-12 appear to be intact, no obvious focal weakness,gait within normal limits no abnormal reflexes or asymmetrical Diabetic foot exam negative SKIN: No acute rashes normal turgor, color, no bruising or petechiae. Right foot lateral has an interesting past of hypopigmentation but no  other major changes PSYCH: Oriented, good eye contact, no obvious depression anxiety, cognition and judgment appear normal. LN: no cervical axillary inguinal adenopathy  Lab Results  Component Value Date   WBC 7.6 06/02/2012   HGB 13.5 06/02/2012   HCT 40.8 06/02/2012   PLT 353.0 06/02/2012   GLUCOSE 91 02/08/2013   CHOL 184 06/07/2013   TRIG 129.0 06/07/2013   HDL 51.20 06/07/2013   LDLDIRECT 127.1 02/08/2013   LDLCALC 107* 06/07/2013   ALT 19 02/08/2013   AST 15 02/08/2013   NA 138 02/08/2013   K 4.5 02/08/2013   CL 98 02/08/2013   CREATININE 0.7 02/08/2013   BUN 12 02/08/2013   CO2 32 02/08/2013   TSH 0.92 10/05/2012   HGBA1C 6.7* 06/07/2013   MICROALBUR 0.2 02/08/2013   Wt Readings from Last 3 Encounters:  06/14/13 278 lb (126.1 kg)  02/23/13 270 lb 3.2 oz (122.562 kg)  02/08/13 273 lb (123.832 kg)    ASSESSMENT AND PLAN:  Discussed the following assessment and plan:  Visit for preventive health examination  Neck pain on left side - seems muscle spasm or neuritis   Unspecified essential hypertension - up some today poss from neck pain   Type II or unspecified type diabetes mellitus without mention of complication, not stated as uncontrolled  Morbid obesity - Contributing to medical disease states.gaine weight instead of losing   Other and unspecified hyperlipidemia - not taking med routinely disc taking this abd reasons, more important that supplements or FO.  Face pain - x 1 week seem to patiten tlikea sinsu infection but no fever or drainage nasal poss allergic risk bnenfot disc amox and nasal steroid consider other eval pers,  Current non-adherence to medical treatment - pravastatin  Unspecified vitamin D deficiency - Normal but low normal level today discussed can take 800 international units the reason for prescription medication.  Need for Tdap vaccination - Plan: Tdap vaccine greater than or equal to 7yo IM  Patient Care Team: Burnis Medin, MD as PCP -  General (Internal Medicine) Mosetta Anis, MD (Allergy) Fabio Pierce, MD as Attending Physician (Ophthalmology) Wallene Huh, DPM as Consulting Physician (Podiatry) Lafayette Dragon, MD as Attending Physician (Gastroenterology) Meredith Pel, MD (Orthopedic Surgery) Minus Breeding, MD as Attending Physician (Cardiology) Selinda Orion, MD as  Attending Physician (Dermatology) Gus Height, MD as Consulting Physician (Obstetrics and Gynecology) Patient Instructions  Neck pain seems like a muscle spasm and or pinched nerve in neck  Usually goes away with time postural hygiene and stretching Can use  muscle relaxant at night or day but could cause drowsiness. uncetaiun if the face pain is infection sinus vs allergy sinus problem Take the nasal steroid every day can add antibiotc realizing risk benefit.  Take the pravastatin daily to reduce risk of stroke heart attack.  Bp is ups some today could be from discomfort . tdap today. Blood glucose is acceptable but is creeping up. Recheck hg a1c in 3 months and then rov rto make sure not worse.    Standley Brooking. Christyan Reger M.D.   Pre visit review using our clinic review tool, if applicable. No additional management support is needed unless otherwise documented below in the visit note.

## 2013-06-14 NOTE — Patient Instructions (Signed)
Neck pain seems like a muscle spasm and or pinched nerve in neck  Usually goes away with time postural hygiene and stretching Can use  muscle relaxant at night or day but could cause drowsiness. uncetaiun if the face pain is infection sinus vs allergy sinus problem Take the nasal steroid every day can add antibiotc realizing risk benefit.  Take the pravastatin daily to reduce risk of stroke heart attack.  Bp is ups some today could be from discomfort . tdap today. Blood glucose is acceptable but is creeping up. Recheck hg a1c in 3 months and then rov rto make sure not worse.

## 2013-06-15 ENCOUNTER — Ambulatory Visit: Payer: No Typology Code available for payment source | Admitting: Internal Medicine

## 2013-06-25 ENCOUNTER — Ambulatory Visit (INDEPENDENT_AMBULATORY_CARE_PROVIDER_SITE_OTHER): Payer: No Typology Code available for payment source | Admitting: Family Medicine

## 2013-06-25 ENCOUNTER — Encounter: Payer: Self-pay | Admitting: Family Medicine

## 2013-06-25 VITALS — BP 138/80 | HR 89 | Temp 98.9°F | Ht 70.25 in | Wt 280.0 lb

## 2013-06-25 DIAGNOSIS — J019 Acute sinusitis, unspecified: Secondary | ICD-10-CM

## 2013-06-25 DIAGNOSIS — H8110 Benign paroxysmal vertigo, unspecified ear: Secondary | ICD-10-CM

## 2013-06-25 MED ORDER — AZELASTINE-FLUTICASONE 137-50 MCG/ACT NA SUSP: 1.0000 | Freq: Every day | NASAL | Status: DC

## 2013-06-25 MED ORDER — MECLIZINE HCL 25 MG PO TABS: 25.0000 mg | ORAL_TABLET | ORAL | Status: DC | PRN

## 2013-06-25 MED ORDER — METHYLPREDNISOLONE ACETATE 80 MG/ML IJ SUSP
120.0000 mg | Freq: Once | INTRAMUSCULAR | Status: AC
  Administered 2013-06-25: 120 mg via INTRAMUSCULAR

## 2013-06-25 NOTE — Progress Notes (Signed)
   Subjective:    Patient ID: Maria Sharp, female    DOB: 11/26/1965, 48 y.o.   MRN: [id number]  HPI Here for several weeks of dizziness when she moves her head quickly. It seems like the room spins for a few seconds. No HA but she has a lot of nasal and sinus pressure.    Review of Systems  Constitutional: Negative.   HENT: Positive for congestion and sinus pressure.   Eyes: Negative.   Respiratory: Negative.   Neurological: Positive for dizziness. Negative for tremors, seizures, syncope, facial asymmetry, speech difficulty, weakness, light-headedness, numbness and headaches.       Objective:   Physical Exam  Constitutional: She is oriented to person, place, and time. She appears well-developed and well-nourished.  HENT:  Head: Normocephalic and atraumatic.  Right Ear: External ear normal.  Left Ear: External ear normal.  Nose: Nose normal.  Mouth/Throat: Oropharynx is clear and moist.  Eyes: Conjunctivae are normal.  Lymphadenopathy:    She has no cervical adenopathy.  Neurological: She is alert and oriented to person, place, and time.          Assessment & Plan:  Add Mucinex 1200 mg bid and try Meclizine

## 2013-06-25 NOTE — Progress Notes (Signed)
Pre visit review using our clinic review tool, if applicable. No additional management support is needed unless otherwise documented below in the visit note. 

## 2013-06-25 NOTE — Addendum Note (Signed)
Addended by: Aggie Hacker A on: 06/25/2013 03:32 PM   Modules accepted: Orders

## 2013-07-26 ENCOUNTER — Ambulatory Visit: Payer: No Typology Code available for payment source

## 2013-07-27 ENCOUNTER — Ambulatory Visit (INDEPENDENT_AMBULATORY_CARE_PROVIDER_SITE_OTHER): Payer: No Typology Code available for payment source | Admitting: Internal Medicine

## 2013-07-27 ENCOUNTER — Encounter: Payer: Self-pay | Admitting: Internal Medicine

## 2013-07-27 VITALS — BP 100/74 | HR 68 | Ht 70.25 in | Wt 274.2 lb

## 2013-07-27 DIAGNOSIS — K3184 Gastroparesis: Secondary | ICD-10-CM

## 2013-07-27 NOTE — Progress Notes (Signed)
Maria Sharp July 02, 1965 [id number]  Note: This dictation was prepared with Dragon digital system. Any transcriptional errors that result from this procedure are unintentional.   History of Present Illness:  This is a 48 year old African American female with irritable bowel syndrome and intermittent right upper quadrant abdominal pain. She was found to have gastroparesis on a gastric emptying scan in November 2014 showing 98% retention in 60 minutes and 85% retention in 120 minutes. A CT scan of the abdomen in 2010 showed mild hepatomegaly. An upper endoscopy in February 2014 showed H. pylori negative gastritis. An upper abdominal ultrasound in February 2014 showed her to be postcholecystectomy . Her colonoscopy in January 2009 was a normal exam. She has been on Prilosec, Bentyl, and Metformin which may be contributing to diarrhea. She has been approved for [employer]. She saw a small amount of bright red blood and had a large bowel movement last week. She complains of early satiety and bloating. She was started on Reglan but could not tolerate the side effects.    Past Medical History  Diagnosis Date  . Diabetes mellitus   . High cholesterol   . IBS (irritable bowel syndrome)   . GERD (gastroesophageal reflux disease)   . Hypertension   . Gastritis   . Fatty liver   . History of varicella   . History of asthma   . Allergic   . Gastroparesis     Past Surgical History  Procedure Laterality Date  . Cholecystectomy      Allergies  Allergen Reactions  . Ace Inhibitors Anaphylaxis  . Shellfish Allergy Anaphylaxis and Other (See Comments)    Only shrimp  . Januvia [Sitagliptin] Other (See Comments)    Recurrent abdominal pain with elevated pancreas enzymes  . Lipitor [Atorvastatin]     Family history and social history have been reviewed.  Review of Systems:   The remainder of the 10 point ROS is negative except as outlined in the H&P  Physical Exam: General Appearance  Well developed, in no distress Eyes  Non icteric  HEENT  Non traumatic, normocephalic  Mouth No lesion, tongue papillated, no cheilosis Neck Supple without adenopathy, thyroid not enlarged, no carotid bruits, no JVD Lungs Clear to auscultation bilaterally COR Normal S1, normal S2, regular rhythm, no murmur, quiet precordium Abdomen obese soft, tender in left lower quadrant and across upper abdomen. No ascites. Quiet bowel sounds. Rectal soft Hemoccult negative stool Extremities  No pedal edema Skin No lesions Neurological Alert and oriented x 3 Psychological Normal mood and affect  Assessment and Plan:   Problem #1 Gastroparesis, likely on the basis of diabetes. I have instructed patient on a gastroparesis diet. We will try domperidone 10 mg 3 times a day. This has to be ordered from [place]. I have given her samples of a probiotic and asked her to start Benefiber 1 teaspoon daily. I will see her in 6 months. Addendum: Pt decided agains Domperidone because it would  not covered by her insurance.   [name] 07/27/2013

## 2013-07-27 NOTE — Patient Instructions (Addendum)
Please purchase the following medications over the counter and take as directed: Benefiber 1 teaspoon daily  We have given you samples of Restora. This puts good bacteria back into your colon. If this works well for you, it can be purchased over the counter.  Please follow up with Dr Olevia Perches in 6 months.  Gastroparesis  Gastroparesis is also called slowed stomach emptying (delayed gastric emptying). It is a condition in which the stomach takes too long to empty its contents. It often happens in people with diabetes.  CAUSES  Gastroparesis happens when nerves to the stomach are damaged or stop working. When the nerves are damaged, the muscles of the stomach and intestines do not work normally. The movement of food is slowed or stopped. High blood glucose (sugar) causes changes in nerves and can damage the blood vessels that carry oxygen and nutrients to the nerves. RISK FACTORS  Diabetes.  Post-viral syndromes.  Eating disorders (anorexia, bulimia).  Surgery on the stomach or vagus nerve.  Gastroesophageal reflux disease (rarely).  Smooth muscle disorders (amyloidosis, scleroderma).  Metabolic disorders, including hypothyroidism.  Parkinson disease. SYMPTOMS   Heartburn.  Feeling sick to your stomach (nausea).  Vomiting of undigested food.  An early feeling of fullness when eating.  Weight loss.  Abdominal bloating.  Erratic blood glucose levels.  Lack of appetite.  Gastroesophageal reflux.  Spasms of the stomach wall. Complications can include:  Bacterial overgrowth in stomach. Food stays in the stomach and can ferment and cause bacteria to grow.  Weight loss due to difficulty digesting and absorbing nutrients.  Vomiting.  Obstruction in the stomach. Undigested food can harden and cause nausea and vomiting.  Blood glucose fluctuations caused by inconsistent food absorption. DIAGNOSIS  The diagnosis of gastroparesis is confirmed through one or more of  the following tests:  Barium X-rays and scans. These tests look at how long it takes for food to move through the stomach.  Gastric manometry. This test measures electrical and muscular activity in the stomach. A thin tube is passed down the throat into the stomach. The tube contains a wire that takes measurements of the stomach's electrical and muscular activity as it digests liquids and solid food.  Endoscopy. This procedure is done with a long, thin tube called an endoscope. It is passed through the mouth and gently down the esophagus into the stomach. This tube helps the caregiver look at the lining of the stomach to check for any abnormalities.  Ultrasonography. This can rule out gallbladder disease or pancreatitis. This test will outline and define the shape of the gallbladder and pancreas. TREATMENT   Treatments may include:  Exercise.  Medicines to control nausea and vomiting.  Medicines to stimulate stomach muscles.  Changes in what and when you eat.  Having smaller meals more often.  Eating low-fiber forms of high-fiber foods, such as eating cooked vegetables instead of raw vegetables.  Eating low-fat foods.  Consuming liquids, which are easier to digest.  In severe cases, feeding tubes and intravenous (IV) feeding may be needed. It is important to note that in most cases, treatment does not cure gastroparesis. It is usually a lasting (chronic) condition. Treatment helps you manage the underlying condition so that you can be as healthy and comfortable as possible. Other treatments  A gastric neurostimulator has been developed to assist people with gastroparesis. The battery-operated device is surgically implanted. It emits mild electrical pulses to help improve stomach emptying and to control nausea and vomiting.  The use  of botulinum toxin has been shown to improve stomach emptying by decreasing the prolonged contractions of the muscle between the stomach and the small  intestine (pyloric sphincter). The benefits are temporary. SEEK MEDICAL CARE IF:   You have diabetes and you are having problems keeping your blood glucose in goal range.  You are having nausea, vomiting, bloating, or early feelings of fullness with eating.  Your symptoms do not change with a change in diet. Document Released: 04/08/2005 Document Revised: 08/03/2012 Document Reviewed: 09/15/2008 [facility] Patient Information 2014 [place], [place].  Call us back when you decide about whether you want to order the domperidone 10 mg three times daily. Dr Regis Bill

## 2013-07-28 ENCOUNTER — Ambulatory Visit
Admission: RE | Admit: 2013-07-28 | Discharge: 2013-07-28 | Disposition: A | Discharge status: Home / Self Care | Payer: 59 | Source: Ambulatory Visit | Attending: Obstetrics and Gynecology | Admitting: Obstetrics and Gynecology

## 2013-07-28 DIAGNOSIS — Z1231 Encounter for screening mammogram for malignant neoplasm of breast: Secondary | ICD-10-CM

## 2013-08-05 ENCOUNTER — Ambulatory Visit (INDEPENDENT_AMBULATORY_CARE_PROVIDER_SITE_OTHER): Payer: 59 | Admitting: Podiatry

## 2013-08-05 ENCOUNTER — Encounter: Payer: Self-pay | Admitting: Podiatry

## 2013-08-05 DIAGNOSIS — L254 Unspecified contact dermatitis due to food in contact with skin: Secondary | ICD-10-CM

## 2013-08-05 DIAGNOSIS — B351 Tinea unguium: Secondary | ICD-10-CM

## 2013-08-05 NOTE — Patient Instructions (Signed)
Diabetes and Foot Care Diabetes may cause you to have problems because of poor blood supply (circulation) to your feet and legs. This may cause the skin on your feet to become thinner, break easier, and heal more slowly. Your skin may become dry, and the skin may peel and crack. You may also have nerve damage in your legs and feet causing decreased feeling in them. You may not notice minor injuries to your feet that could lead to infections or more serious problems. Taking care of your feet is one of the most important things you can do for yourself.  HOME CARE INSTRUCTIONS  Wear shoes at all times, even in the house. Do not go barefoot. Bare feet are easily injured.  Check your feet daily for blisters, cuts, and redness. If you cannot see the bottom of your feet, use a mirror or ask someone for help.  Wash your feet with warm water (do not use hot water) and mild soap. Then pat your feet and the areas between your toes until they are completely dry. Do not soak your feet as this can dry your skin.  Apply a moisturizing lotion or petroleum jelly (that does not contain alcohol and is unscented) to the skin on your feet and to dry, brittle toenails. Do not apply lotion between your toes.  Trim your toenails straight across. Do not dig under them or around the cuticle. File the edges of your nails with an emery board or nail file.  Do not cut corns or calluses or try to remove them with medicine.  Wear clean socks or stockings every day. Make sure they are not too tight. Do not wear knee-high stockings since they may decrease blood flow to your legs.  Wear shoes that fit properly and have enough cushioning. To break in new shoes, wear them for just a few hours a day. This prevents you from injuring your feet. Always look in your shoes before you put them on to be sure there are no objects inside.  Do not cross your legs. This may decrease the blood flow to your feet.  If you find a minor scrape,  cut, or break in the skin on your feet, keep it and the skin around it clean and dry. These areas may be cleansed with mild soap and water. Do not cleanse the area with peroxide, alcohol, or iodine.  When you remove an adhesive bandage, be sure not to damage the skin around it.  If you have a wound, look at it several times a day to make sure it is healing.  Do not use heating pads or hot water bottles. They may burn your skin. If you have lost feeling in your feet or legs, you may not know it is happening until it is too late.  Make sure your health care provider performs a complete foot exam at least annually or more often if you have foot problems. Report any cuts, sores, or bruises to your health care provider immediately. SEEK MEDICAL CARE IF:   You have an injury that is not healing.  You have cuts or breaks in the skin.  You have an ingrown nail.  You notice redness on your legs or feet.  You feel burning or tingling in your legs or feet.  You have pain or cramps in your legs and feet.  Your legs or feet are numb.  Your feet always feel cold. SEEK IMMEDIATE MEDICAL CARE IF:   There is increasing redness,   swelling, or pain in or around a wound.  There is a red line that goes up your leg.  Pus is coming from a wound.  You develop a fever or as directed by your health care provider.  You notice a bad smell coming from an ulcer or wound. Document Released: 04/05/2000 Document Revised: 12/09/2012 Document Reviewed: 09/15/2012 [employer] Patient Information 2014 [employer].  

## 2013-08-06 NOTE — Progress Notes (Signed)
Subjective:     Patient ID: Maria Sharp, female   DOB: 10-01-1965, 48 y.o.   MRN: [id number]  HPI patient presents in poor health with nail disease 1-5 both feet that are thick and impossible for her to cut   Review of Systems     Objective:   Physical Exam Neurovascular status intact with thick painful nailbeds 1-5 both feet that are incurvated in the corners    Assessment:     Mycotic nail infection with pain 1-5 both feet    Plan:     Debridement of painful nailbeds 1-5 both feet with no iatrogenic bleeding noted

## 2013-09-06 ENCOUNTER — Other Ambulatory Visit (INDEPENDENT_AMBULATORY_CARE_PROVIDER_SITE_OTHER): Payer: 59

## 2013-09-06 DIAGNOSIS — E1165 Type 2 diabetes mellitus with hyperglycemia: Principal | ICD-10-CM

## 2013-09-06 DIAGNOSIS — E039 Hypothyroidism, unspecified: Secondary | ICD-10-CM

## 2013-09-06 DIAGNOSIS — D649 Anemia, unspecified: Secondary | ICD-10-CM

## 2013-09-06 DIAGNOSIS — IMO0001 Reserved for inherently not codable concepts without codable children: Secondary | ICD-10-CM

## 2013-09-06 LAB — CBC WITH DIFFERENTIAL/PLATELET
Basophils Absolute: 0 10*3/uL (ref 0.0–0.1)
Basophils Relative: 0.4 % (ref 0.0–3.0)
Eosinophils Absolute: 0.1 10*3/uL (ref 0.0–0.7)
Eosinophils Relative: 0.8 % (ref 0.0–5.0)
HCT: 38.3 % (ref 36.0–46.0)
Hemoglobin: 12.4 g/dL (ref 12.0–15.0)
Lymphocytes Relative: 29.2 % (ref 12.0–46.0)
Lymphs Abs: 3 10*3/uL (ref 0.7–4.0)
MCHC: 32.3 g/dL (ref 30.0–36.0)
MCV: 86.2 fl (ref 78.0–100.0)
Monocytes Absolute: 0.8 10*3/uL (ref 0.1–1.0)
Monocytes Relative: 7.6 % (ref 3.0–12.0)
Neutro Abs: 6.4 10*3/uL (ref 1.4–7.7)
Neutrophils Relative %: 62 % (ref 43.0–77.0)
Platelets: 361 10*3/uL (ref 150.0–400.0)
RBC: 4.44 Mil/uL (ref 3.87–5.11)
RDW: 16.1 % — ABNORMAL HIGH (ref 11.5–15.5)
WBC: 10.4 10*3/uL (ref 4.0–10.5)

## 2013-09-06 LAB — TSH: TSH: 0.71 u[IU]/mL (ref 0.35–4.50)

## 2013-09-06 LAB — HEMOGLOBIN A1C: Hgb A1c MFr Bld: 6.9 % — ABNORMAL HIGH (ref 4.6–6.5)

## 2013-09-14 ENCOUNTER — Ambulatory Visit (INDEPENDENT_AMBULATORY_CARE_PROVIDER_SITE_OTHER): Payer: 59 | Admitting: Internal Medicine

## 2013-09-14 ENCOUNTER — Encounter: Payer: Self-pay | Admitting: Internal Medicine

## 2013-09-14 VITALS — BP 100/70 | Temp 98.7°F | Ht 70.25 in | Wt 280.0 lb

## 2013-09-14 DIAGNOSIS — E669 Obesity, unspecified: Secondary | ICD-10-CM

## 2013-09-14 DIAGNOSIS — E119 Type 2 diabetes mellitus without complications: Secondary | ICD-10-CM

## 2013-09-14 DIAGNOSIS — I8390 Asymptomatic varicose veins of unspecified lower extremity: Secondary | ICD-10-CM

## 2013-09-14 DIAGNOSIS — I10 Essential (primary) hypertension: Secondary | ICD-10-CM

## 2013-09-14 DIAGNOSIS — E785 Hyperlipidemia, unspecified: Secondary | ICD-10-CM

## 2013-09-14 MED ORDER — METFORMIN HCL ER 750 MG PO TB24: 1500.0000 mg | ORAL_TABLET | Freq: Every day | ORAL | Status: DC

## 2013-09-14 NOTE — Patient Instructions (Addendum)
Increase the metformin to 875 twice a day.  Avoid ALL THE simple carb snacks that we discussed and the sweet tea.. Can substitute.  fresh fruit  If needed. Will arrange a referral for  diabetes and nutrition.   hga1c pre visit in  4 months or as needed

## 2013-09-14 NOTE — Progress Notes (Signed)
Chief Complaint  Patient presents with  . Follow-up    HPI: Maria Sharp  comes in today for follow up of  multiple medical problems.  DM  seet tea oatmeal cakes and sometimes chips in afternoon at machine at work  Knows that is not good.  On metformin 1000 per day  Not 2000. Feet flakily at times  To see [facility] to do nails. Has vv boterhe her at end of day but not using compresion stockings cause hard to use.  Back   Chronic  Numbness  Foot a times  Joint aches  Knees  And back   Asks about vit d level. Still having periods .   ROS: See pertinent positives and negatives per HPI. No diszziness syncope exercise intolerance   Past Medical History  Diagnosis Date  . Diabetes mellitus   . High cholesterol   . IBS (irritable bowel syndrome)   . GERD (gastroesophageal reflux disease)   . Hypertension   . Gastritis   . Fatty liver   . History of varicella   . History of asthma   . Allergic   . Gastroparesis     Family History  Problem Relation Age of Onset  . Colon cancer Neg Hx   . Esophageal cancer Neg Hx   . Stomach cancer Neg Hx   . Rectal cancer Neg Hx   . Prostate cancer Father   . Diabetes Father   . Hypertension Father   . Diabetes Mother   . Heart disease Mother   . Arthritis Mother   . Hyperlipidemia Mother   . Hypertension Mother   . Parkinson's disease Mother   . Diabetes Paternal Grandmother   . Arthritis Paternal Grandmother   . Hyperlipidemia Paternal Grandmother   . Heart disease Paternal Grandmother   . Dementia Paternal Grandmother   . Hypertension Paternal Grandmother   . Hyperlipidemia Maternal Grandmother   . Hypertension Maternal Grandmother   . Hyperlipidemia Maternal Grandfather   . Hypertension Maternal Grandfather   . Hyperlipidemia Paternal Grandfather   . Hypertension Paternal Grandfather   . Diabetes Paternal Grandfather   . Cholelithiasis Paternal Grandfather   . Thyroid disease Mother     History   Social History    . Marital Status: Single    Spouse Name: N/A    Number of Children: N/A  . Years of Education: N/A   Social History Main Topics  . Smoking status: Never Smoker   . Smokeless tobacco: Never Used  . Alcohol Use: No  . Drug Use: No  . Sexual Activity: None   Other Topics Concern  . None   Social History Narrative   Usually # of hours of sleep per night: 6   3 of people living at your residence? 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   Irregular sleep 7:30 to 11 or 12 and then 4 to 6:30 before she goes to work   Has attended college.   Originally from [place]   Abnormal pap remote last 2012     Outpatient Encounter Prescriptions as of 09/14/2013  Medication Sig  . Azelastine-Fluticasone (DYMISTA) 137-50 MCG/ACT SUSP Place 1 spray into the nose daily.  Marland Kitchen EPINEPHrine (EPI-PEN) 0.3 mg/0.3 mL DEVI Inject 0.3 mg into the muscle once as needed. For severe allergic reaction  . metFORMIN (GLUCOPHAGE-XR)  750 MG 24 hr tablet Take 2 tablets (1,500 mg total) by mouth daily with breakfast.  . methocarbamol (ROBAXIN-750) 750 MG tablet Take 1 tablet (750 mg total) by mouth every 6 (six) hours as needed for muscle spasms.  . montelukast (SINGULAIR) 10 MG tablet   . norgestimate-ethinyl estradiol (ORTHO-CYCLEN,SPRINTEC,PREVIFEM) 0.25-35 MG-MCG tablet Take 1 tablet by mouth daily.  Marland Kitchen olmesartan (BENICAR) 20 MG tablet take 1 tablet by mouth once daily  . omeprazole (PRILOSEC) 40 MG capsule Take 1 capsule (40 mg total) by mouth daily.  . pravastatin (PRAVACHOL) 20 MG tablet Take 1 tablet (20 mg total) by mouth daily.  Marland Kitchen triamterene-hydrochlorothiazide (MAXZIDE-25) 37.5-25 MG per tablet take 1 tablet by mouth once daily  . [DISCONTINUED] amoxicillin (AMOXIL) 500 MG capsule Take 1 capsule (500 mg total) by mouth 3 (three) times daily. fo sinusitis  . [DISCONTINUED] meclizine  (ANTIVERT) 25 MG tablet Take 1 tablet (25 mg total) by mouth every 4 (four) hours as needed for dizziness.  . [DISCONTINUED] metFORMIN (GLUCOPHAGE) 1000 MG tablet take 1 tablet by mouth twice a day with food    EXAM:  BP 100/70  Temp(Src) 98.7 F (37.1 C) (Oral)  Ht 5' 10.25" (1.784 m)  Wt 280 lb (127.007 kg)  BMI 39.91 kg/m2  Body mass index is 39.91 kg/(m^2).  GENERAL: vitals reviewed and listed above, alert, oriented, appears well hydrated and in no acute distress HEENT: atraumatic, conjunctiva  clear, no obvious abnormalities on inspection of external nose and ears  LUNGS: clear to auscultation bilaterally, no wheezes, rales or rhonchi, good air movement CV: HRRR, no clubbing cyanosis or  peripheral edema nl cap refill   VV noted legs no ulcerations  MS: moves all extremities without noticeable focal  Abnormality Skin toenails clear but left great toe distal thickening and white area  PSYCH: pleasant and cooperative, no obvious depression or anxiety Lab Results  Component Value Date   WBC 10.4 09/06/2013   HGB 12.4 09/06/2013   HCT 38.3 09/06/2013   PLT 361.0 09/06/2013   GLUCOSE 91 02/08/2013   CHOL 184 06/07/2013   TRIG 129.0 06/07/2013   HDL 51.20 06/07/2013   LDLDIRECT 127.1 02/08/2013   LDLCALC 107* 06/07/2013   ALT 19 02/08/2013   AST 15 02/08/2013   NA 138 02/08/2013   K 4.5 02/08/2013   CL 98 02/08/2013   CREATININE 0.7 02/08/2013   BUN 12 02/08/2013   CO2 32 02/08/2013   TSH 0.71 09/06/2013   HGBA1C 6.9* 09/06/2013   MICROALBUR 0.2 02/08/2013    ASSESSMENT AND PLAN:  Discussed the following assessment and plan:  DIABETES MELLITUS - deterioration only taking 1000 metformin inc to 1500 per dayreferal diabetes educ etcmay may benefot from omintoring check insurance coverage rx if wishes  - Plan: Amb Referral to Nutrition and Diabetic E  Obesity (BMI 30-39.9) - Plan: Amb Referral to Nutrition and Diabetic E  HYPERTENSION - lower readings today but  no sx of low  bp.  - Plan: Amb Referral to Nutrition and Diabetic E  Varicose veins  HYPERLIPIDEMIA strategies disc   Fraser Din says not a lot of well power  She has to decide to change behavior  And habit cues.  Reviewed barrieres to change  NO SWEET beverages.  -Patient advised to return or notify health care team  if symptoms worsen ,persist or new concerns arise.  Patient Instructions  Increase the metformin to 875 twice a day.  Avoid ALL THE simple carb snacks that we discussed and  the sweet tea.. Can substitute.  fresh fruit  If needed. Will arrange a referral for  diabetes and nutrition.   hga1c pre visit in  4 months or as needed   Mariann Laster K. Lorenda Grecco M.D.  Pre visit review using our clinic review tool, if applicable. No additional management support is needed unless otherwise documented below in the visit note. Total visit 59mins > 50% spent counseling and coordinating care

## 2013-11-12 ENCOUNTER — Emergency Department (HOSPITAL_BASED_OUTPATIENT_CLINIC_OR_DEPARTMENT_OTHER): Payer: No Typology Code available for payment source

## 2013-11-12 ENCOUNTER — Other Ambulatory Visit: Payer: Self-pay

## 2013-11-12 ENCOUNTER — Encounter (HOSPITAL_BASED_OUTPATIENT_CLINIC_OR_DEPARTMENT_OTHER): Payer: Self-pay | Admitting: Emergency Medicine

## 2013-11-12 ENCOUNTER — Emergency Department (HOSPITAL_BASED_OUTPATIENT_CLINIC_OR_DEPARTMENT_OTHER)
Admission: EM | Admit: 2013-11-12 | Discharge: 2013-11-13 | Disposition: A | Discharge status: Home / Self Care | Payer: No Typology Code available for payment source | Attending: Emergency Medicine | Admitting: Emergency Medicine

## 2013-11-12 DIAGNOSIS — E7800 Pure hypercholesterolemia, unspecified: Secondary | ICD-10-CM | POA: Diagnosis not present

## 2013-11-12 DIAGNOSIS — I10 Essential (primary) hypertension: Secondary | ICD-10-CM | POA: Diagnosis not present

## 2013-11-12 DIAGNOSIS — R072 Precordial pain: Secondary | ICD-10-CM | POA: Insufficient documentation

## 2013-11-12 DIAGNOSIS — Z8619 Personal history of other infectious and parasitic diseases: Secondary | ICD-10-CM | POA: Insufficient documentation

## 2013-11-12 DIAGNOSIS — K219 Gastro-esophageal reflux disease without esophagitis: Secondary | ICD-10-CM | POA: Diagnosis not present

## 2013-11-12 DIAGNOSIS — R0789 Other chest pain: Secondary | ICD-10-CM | POA: Diagnosis not present

## 2013-11-12 DIAGNOSIS — J45901 Unspecified asthma with (acute) exacerbation: Secondary | ICD-10-CM | POA: Insufficient documentation

## 2013-11-12 DIAGNOSIS — E119 Type 2 diabetes mellitus without complications: Secondary | ICD-10-CM | POA: Diagnosis not present

## 2013-11-12 DIAGNOSIS — Z79899 Other long term (current) drug therapy: Secondary | ICD-10-CM | POA: Insufficient documentation

## 2013-11-12 DIAGNOSIS — R079 Chest pain, unspecified: Secondary | ICD-10-CM

## 2013-11-12 LAB — CBC
HCT: 39 % (ref 36.0–46.0)
Hemoglobin: 12.9 g/dL (ref 12.0–15.0)
MCH: 27.4 pg (ref 26.0–34.0)
MCHC: 33.1 g/dL (ref 30.0–36.0)
MCV: 82.8 fL (ref 78.0–100.0)
Platelets: 359 10*3/uL (ref 150–400)
RBC: 4.71 MIL/uL (ref 3.87–5.11)
RDW: 15.3 % (ref 11.5–15.5)
WBC: 10 10*3/uL (ref 4.0–10.5)

## 2013-11-12 LAB — TROPONIN I
Troponin I: <0.3 ng/mL (ref <0.30)
Troponin I: <0.3 ng/mL (ref <0.30)

## 2013-11-12 LAB — BASIC METABOLIC PANEL
Anion gap: 15 (ref 5–15)
BUN: 11 mg/dL (ref 6–23)
CO2: 28 mEq/L (ref 19–32)
Calcium: 10.1 mg/dL (ref 8.4–10.5)
Chloride: 96 mEq/L (ref 96–112)
Creatinine, Ser: 0.7 mg/dL (ref 0.50–1.10)
GFR calc Af Amer: >90 mL/min (ref >90)
GFR calc non Af Amer: >90 mL/min (ref >90)
Glucose, Bld: 121 mg/dL — ABNORMAL HIGH (ref 70–99)
Potassium: 3.4 mEq/L — ABNORMAL LOW (ref 3.7–5.3)
Sodium: 139 mEq/L (ref 137–147)

## 2013-11-12 LAB — PRO B NATRIURETIC PEPTIDE: Pro B Natriuretic peptide (BNP): 28.6 pg/mL (ref 0–125)

## 2013-11-12 MED ORDER — HYDROCODONE-ACETAMINOPHEN 5-325 MG PO TABS
1.0000 | ORAL_TABLET | Freq: Once | ORAL | Status: AC
  Administered 2013-11-12: 1 via ORAL
  Filled 2013-11-12: qty 1

## 2013-11-12 MED ORDER — ASPIRIN 325 MG PO TABS: 325.0000 mg | ORAL_TABLET | ORAL | Status: DC

## 2013-11-12 NOTE — ED Notes (Signed)
Pt c/o chest pain radiaitng under both breast to center of chest through to back with SOB since yesterday with nausea

## 2013-11-12 NOTE — Discharge Instructions (Signed)

## 2013-11-12 NOTE — ED Notes (Signed)
Pt states took 2 baby asa on the way here and took prilosec this am with no relief

## 2013-11-12 NOTE — ED Notes (Addendum)
Twice EKG to completed at 1130pm (11/12/13) Per Pisciotta PA

## 2013-11-12 NOTE — ED Provider Notes (Signed)
Medical screening examination/treatment/procedure(s) were conducted as a shared visit with non-physician practitioner(s) and myself.  I personally evaluated the patient during the encounter.   EKG Interpretation   Date/Time:  Friday November 12 2013 23:42:32 EDT Ventricular Rate:  82 PR Interval:  160 QRS Duration: 90 QT Interval:  396 QTC Calculation: 462 R Axis:   13 Text Interpretation:  Normal sinus rhythm Cannot rule out Anterior infarct  , age undetermined Abnormal ECG since last tracing no significant change  Confirmed by Jimia Gentles  MD, Cerina Leary ([id number]) on 11/12/2013 11:47:07 PM     Pt with atypical chest pain lasting 30 seconds or less.  Non exertional.  Associated with belching.  EKG okay.  Will check delta troponin.  If neg, close f/u with cardiologist.  HEART score 3 (low risk).   Malvin Johns, MD 11/12/13 [phone]

## 2013-11-12 NOTE — ED Provider Notes (Signed)
CSN: [id number]     Arrival date & time 11/12/13  2013 History   First MD Initiated Contact with Patient 11/12/13 2040     Chief Complaint  Patient presents with  . Chest Pain     (Consider location/radiation/quality/duration/timing/severity/associated sxs/prior Treatment) HPI  Maria Sharp is a 48 y.o. female complaining of substernal chest pain, rated at 8/10, described as dull onset last night while patient was sleeping radiates to the left side of the chest, beneath both breasts and around to the back, associated with nausea and mild shortness of breath. Pain lasts for 30 seconds and is intermittent, it is nonexertional, positional or pleuritic. Patient denies diaphoresis, syncope, fever, chills, cough above her baseline, history of DVT or PE, recent immobilization or long trips, calf pain or swelling. Patient took one 81 mg aspirin last night and 2x 81 mg aspirin on her way to the ED today. Patient had normal stress test 2 years ago. Follows with Dr. Percival Spanish for palpitations. Patient states family history of cardiac issues father had heart attack in his early 74s.  RF: Non-insulin-dependent diabetic, hypertension, hyperlipidemia    Past Medical History  Diagnosis Date  . Diabetes mellitus   . High cholesterol   . IBS (irritable bowel syndrome)   . GERD (gastroesophageal reflux disease)   . Hypertension   . Gastritis   . Fatty liver   . History of varicella   . History of asthma   . Allergic   . Gastroparesis    Past Surgical History  Procedure Laterality Date  . Cholecystectomy     Family History  Problem Relation Age of Onset  . Colon cancer Neg Hx   . Esophageal cancer Neg Hx   . Stomach cancer Neg Hx   . Rectal cancer Neg Hx   . Prostate cancer Father   . Diabetes Father   . Hypertension Father   . Diabetes Mother   . Heart disease Mother   . Arthritis Mother   . Hyperlipidemia Mother   . Hypertension Mother   . Parkinson's disease Mother   . Diabetes  Paternal Grandmother   . Arthritis Paternal Grandmother   . Hyperlipidemia Paternal Grandmother   . Heart disease Paternal Grandmother   . Dementia Paternal Grandmother   . Hypertension Paternal Grandmother   . Hyperlipidemia Maternal Grandmother   . Hypertension Maternal Grandmother   . Hyperlipidemia Maternal Grandfather   . Hypertension Maternal Grandfather   . Hyperlipidemia Paternal Grandfather   . Hypertension Paternal Grandfather   . Diabetes Paternal Grandfather   . Cholelithiasis Paternal Grandfather   . Thyroid disease Mother    History  Substance Use Topics  . Smoking status: Never Smoker   . Smokeless tobacco: Never Used  . Alcohol Use: No   OB History   Grav Para Term Preterm Abortions TAB SAB Ect Mult Living   0              Review of Systems    Allergies  Ace inhibitors; Shellfish allergy; Januvia; and Lipitor  Home Medications   Prior to Admission medications   Medication Sig Start Date End Date Taking? Authorizing Provider  Azelastine-Fluticasone (DYMISTA) 137-50 MCG/ACT SUSP Place 1 spray into the nose daily. 06/25/13   Laurey Morale, MD  EPINEPHrine (EPI-PEN) 0.3 mg/0.3 mL DEVI Inject 0.3 mg into the muscle once as needed. For severe allergic reaction    Historical Provider, MD  metFORMIN (GLUCOPHAGE-XR) 750 MG 24 hr tablet Take 2 tablets (1,500 mg  total) by mouth daily with breakfast. 09/14/13   Burnis Medin, MD  methocarbamol (ROBAXIN-750) 750 MG tablet Take 1 tablet (750 mg total) by mouth every 6 (six) hours as needed for muscle spasms. 06/14/13   Burnis Medin, MD  montelukast (SINGULAIR) 10 MG tablet  09/16/12   Historical Provider, MD  norgestimate-ethinyl estradiol (ORTHO-CYCLEN,SPRINTEC,PREVIFEM) 0.25-35 MG-MCG tablet Take 1 tablet by mouth daily.    Historical Provider, MD  olmesartan (BENICAR) 20 MG tablet take 1 tablet by mouth once daily 06/14/13   Burnis Medin, MD  omeprazole (PRILOSEC) 40 MG capsule Take 1 capsule (40 mg total) by mouth  daily. 06/14/13   Burnis Medin, MD  pravastatin (PRAVACHOL) 20 MG tablet Take 1 tablet (20 mg total) by mouth daily. 02/08/13   Burnis Medin, MD  triamterene-hydrochlorothiazide (MAXZIDE-25) 37.5-25 MG per tablet take 1 tablet by mouth once daily 06/14/13   Burnis Medin, MD   BP 139/66  Pulse 84  Temp(Src) 97.6 F (36.4 C) (Oral)  Resp 16  SpO2 100%  LMP 10/22/2013 Physical Exam  Nursing note and vitals reviewed. Constitutional: She is oriented to person, place, and time. She appears well-developed and well-nourished. No distress.  HENT:  Head: Normocephalic and atraumatic.  Mouth/Throat: Oropharynx is clear and moist.  Eyes: Conjunctivae and EOM are normal. Pupils are equal, round, and reactive to light.  Neck: Normal range of motion.  Cardiovascular: Normal rate, regular rhythm and intact distal pulses.   Pulmonary/Chest: Effort normal and breath sounds normal. No stridor. No respiratory distress. She has no wheezes. She has no rales. She exhibits no tenderness.  Abdominal: Soft. Bowel sounds are normal. She exhibits no distension and no mass. There is no tenderness. There is no rebound and no guarding.  Musculoskeletal: Normal range of motion. She exhibits no edema and no tenderness.  No calf asymmetry, superficial collaterals, palpable cords, edema, Homans sign negative bilaterally.    Neurological: She is alert and oriented to person, place, and time.  Psychiatric: She has a normal mood and affect.    ED Course  Procedures (including critical care time) Labs Review Labs Reviewed  BASIC METABOLIC PANEL - Abnormal; Notable for the following:    Potassium 3.4 (*)    Glucose, Bld 121 (*)    All other components within normal limits  CBC  TROPONIN I  PRO B NATRIURETIC PEPTIDE  TROPONIN I    Imaging Review Dg Chest 2 View  11/12/2013   CLINICAL DATA:  Chest pain.  Shortness of breath.  EXAM: CHEST  2 VIEW  COMPARISON:  Chest x-ray 05/25/2011.  FINDINGS: Mediastinum and  hilar structures normal. Lungs are clear. Heart size normal. No pleural effusion or pneumothorax. No acute osseous abnormality.  IMPRESSION: No acute cardiopulmonary disease.   Electronically Signed   By: Marcello Moores  Register   On: 11/12/2013 21:09     EKG Interpretation None      MDM   Final diagnoses:  Chest pain, unspecified chest pain type    Filed Vitals:   11/12/13 2031 11/12/13 2038 11/12/13 2224  BP: 152/98 127/73 139/66  Pulse: 97 86 84  Temp: 97.6 F (36.4 C)    TempSrc: Oral    Resp: 18 14 16   SpO2: 100% 100% 100%    Medications  HYDROcodone-acetaminophen (NORCO/VICODIN) 5-325 MG per tablet 1 tablet (1 tablet Oral Given 11/12/13 2223)    Maria Sharp is a 48 y.o. female presenting with chest pain onset last night lasting for  30 seconds associated with nausea and shortness of breath. Chest pain is atypical, doubt cardiac in origin. EKG nonischemic, physical with no signs of DVT. EKG is nonischemic, troponin negative, chest x-ray without abnormality, blood work otherwise unremarkable. Heart score of 3. Considering patient's age, history of diabetes high blood pressure and hyperlipidemia adult a troponin and EKG as planned. Patient amenable to care plan. Case signed out to Dr. Rosaria Ferries Pisciotta, PA-C 11/12/13 [place], MD 11/12/13 [phone]

## 2013-12-06 ENCOUNTER — Other Ambulatory Visit (INDEPENDENT_AMBULATORY_CARE_PROVIDER_SITE_OTHER): Payer: 59

## 2013-12-06 ENCOUNTER — Ambulatory Visit (INDEPENDENT_AMBULATORY_CARE_PROVIDER_SITE_OTHER): Payer: 59 | Admitting: Cardiology

## 2013-12-06 ENCOUNTER — Encounter: Payer: Self-pay | Admitting: Cardiology

## 2013-12-06 VITALS — BP 130/80 | HR 82 | Ht 70.0 in | Wt 276.0 lb

## 2013-12-06 DIAGNOSIS — IMO0001 Reserved for inherently not codable concepts without codable children: Secondary | ICD-10-CM

## 2013-12-06 DIAGNOSIS — E1165 Type 2 diabetes mellitus with hyperglycemia: Principal | ICD-10-CM

## 2013-12-06 DIAGNOSIS — I517 Cardiomegaly: Secondary | ICD-10-CM

## 2013-12-06 DIAGNOSIS — R079 Chest pain, unspecified: Secondary | ICD-10-CM

## 2013-12-06 DIAGNOSIS — R002 Palpitations: Secondary | ICD-10-CM

## 2013-12-06 LAB — BASIC METABOLIC PANEL
BUN: 10 mg/dL (ref 6–23)
CO2: 30 mEq/L (ref 19–32)
Calcium: 9.7 mg/dL (ref 8.4–10.5)
Chloride: 98 mEq/L (ref 96–112)
Creatinine, Ser: 0.6 mg/dL (ref 0.4–1.2)
GFR: 134.78 mL/min (ref >60.00)
Glucose, Bld: 107 mg/dL — ABNORMAL HIGH (ref 70–99)
Potassium: 3.9 mEq/L (ref 3.5–5.1)
Sodium: 138 mEq/L (ref 135–145)

## 2013-12-06 LAB — HEMOGLOBIN A1C: Hgb A1c MFr Bld: 6.8 % — ABNORMAL HIGH (ref 4.6–6.5)

## 2013-12-06 NOTE — Progress Notes (Signed)
HPI The patient presents for evaluation of chest discomfort. She was in the emergency room at the end of July with chest discomfort. I reviewed these records. This was an upper epigastric discomfort that radiated around to her back. It seemed to be deep and depressing. She was nauseated and felt full. He did not describe jaw or arm discomfort. She did not have vomiting. I did review the emergency records and there was no objective evidence of ischemia with negative enzymes, negative d-dimer and an unremarkable EKG and chest x-ray. She has not had severe discomfort like this since then and was not having this before. She is not overly active but she does work night shift and has to climb and walk quite a bit. With this she cannot bring on the symptoms. She does not describing new PND or orthopnea. She does have occasional palpitations but no presyncope or syncope.  Allergies  Allergen Reactions  . Ace Inhibitors Anaphylaxis  . Shellfish Allergy Anaphylaxis and Other (See Comments)    Only shrimp  . Januvia [Sitagliptin] Other (See Comments)    Recurrent abdominal pain with elevated pancreas enzymes  . Lipitor [Atorvastatin]     Current Outpatient Prescriptions  Medication Sig Dispense Refill  . Azelastine-Fluticasone (DYMISTA) 137-50 MCG/ACT SUSP Place 1 spray into the nose daily.  23 g  0  . EPINEPHrine (EPI-PEN) 0.3 mg/0.3 mL DEVI Inject 0.3 mg into the muscle once as needed. For severe allergic reaction      . metFORMIN (GLUCOPHAGE-XR) 750 MG 24 hr tablet Take 2 tablets (1,500 mg total) by mouth daily with breakfast.  60 tablet  6  . methocarbamol (ROBAXIN-750) 750 MG tablet Take 1 tablet (750 mg total) by mouth every 6 (six) hours as needed for muscle spasms.  40 tablet  1  . montelukast (SINGULAIR) 10 MG tablet       . norgestimate-ethinyl estradiol (ORTHO-CYCLEN,SPRINTEC,PREVIFEM) 0.25-35 MG-MCG tablet Take 1 tablet by mouth daily.      Marland Kitchen olmesartan (BENICAR) 20 MG tablet take 1 tablet  by mouth once daily  30 tablet  6  . omeprazole (PRILOSEC) 40 MG capsule Take 1 capsule (40 mg total) by mouth daily.  30 capsule  5  . pravastatin (PRAVACHOL) 20 MG tablet Take 1 tablet (20 mg total) by mouth daily.  90 tablet  3  . triamterene-hydrochlorothiazide (MAXZIDE-25) 37.5-25 MG per tablet take 1 tablet by mouth once daily  30 tablet  11   No current facility-administered medications for this visit.    Past Medical History  Diagnosis Date  . Diabetes mellitus   . High cholesterol   . IBS (irritable bowel syndrome)   . GERD (gastroesophageal reflux disease)   . Hypertension   . Gastritis   . Fatty liver   . History of varicella   . History of asthma   . Allergic   . Gastroparesis     Past Surgical History  Procedure Laterality Date  . Cholecystectomy      ROS:  As stated in the HPI and negative for all other systems.  PHYSICAL EXAM BP 130/80  Pulse 82  Ht 5\' 10"  (1.778 m)  Wt 276 lb (125.193 kg)  BMI 39.60 kg/m2  LMP 10/22/2013 GENERAL:  Well appearing HEENT:  Pupils equal round and reactive, fundi not visualized, oral mucosa unremarkable NECK:  No jugular venous distention, waveform within normal limits, carotid upstroke brisk and symmetric, no bruits, no thyromegaly LYMPHATICS:  No cervical, inguinal adenopathy LUNGS:  Clear to  auscultation bilaterally BACK:  No CVA tenderness CHEST:  Unremarkable HEART:  PMI not displaced or sustained,S1 and S2 within normal limits, no S3, no S4, no clicks, no rubs, no murmurs ABD:  Flat, positive bowel sounds normal in frequency in pitch, no bruits, no rebound, no guarding, no midline pulsatile mass, no hepatomegaly, no splenomegaly EXT:  2 plus pulses throughout, no edema, no cyanosis no clubbing SKIN:  No rashes no nodules NEURO:  Cranial nerves II through XII grossly intact, motor grossly intact throughout PSYCH:  Cognitively intact, oriented to person place and time   EKG:   Normal sinus rhythm, rate 82, right  bundle branch block, first degree AV block, no acute ST-T wave changes. 12/06/2013  ASSESSMENT AND PLAN   CHEST PAIN The discomfort is atypical but she does have significant cardiovascular risk factors. I will bring the patient back for a POET (Plain Old Exercise Test). This will allow me to screen for obstructive coronary disease, risk stratify and very importantly provide a prescription for exercise.  PALPITATIONS  These are unchanged from previous. No further workup is suggested.  HYPERTENSION Her blood pressure is controlled and she will continue the meds as listed.   DM A1 C. Was 6.7 in February.

## 2013-12-06 NOTE — Patient Instructions (Signed)
Your physician recommends that you schedule a follow-up appointment in: as needed with dr. Percival Spanish  WE are ordering a treadmill test

## 2013-12-16 ENCOUNTER — Telehealth (HOSPITAL_COMMUNITY): Payer: Self-pay

## 2013-12-16 NOTE — Telephone Encounter (Signed)
Encounter complete. 

## 2013-12-20 ENCOUNTER — Encounter: Payer: Self-pay | Admitting: Internal Medicine

## 2013-12-20 ENCOUNTER — Ambulatory Visit (INDEPENDENT_AMBULATORY_CARE_PROVIDER_SITE_OTHER): Payer: 59 | Admitting: Internal Medicine

## 2013-12-20 VITALS — BP 116/80 | Temp 98.6°F | Ht 70.25 in | Wt 282.0 lb

## 2013-12-20 DIAGNOSIS — Z23 Encounter for immunization: Secondary | ICD-10-CM

## 2013-12-20 DIAGNOSIS — E119 Type 2 diabetes mellitus without complications: Secondary | ICD-10-CM

## 2013-12-20 DIAGNOSIS — E785 Hyperlipidemia, unspecified: Secondary | ICD-10-CM

## 2013-12-20 DIAGNOSIS — I10 Essential (primary) hypertension: Secondary | ICD-10-CM

## 2013-12-20 NOTE — Progress Notes (Signed)
Pre visit review using our clinic review tool, if applicable. No additional management support is needed unless otherwise documented below in the visit note.  Chief Complaint  Patient presents with  . Follow-up    HPI: Maria Sharp  Fu dm bp etc . To have stress test but no co today not exercising much and knows needs to forgeting theprava cause takes different time of day than other meds .  Fiance has lost 50 pounds  She "needs to get motivated"   Works night sleeps day ROS: See pertinent positives and negatives per HPI.  Past Medical History  Diagnosis Date  . Diabetes mellitus   . High cholesterol   . IBS (irritable bowel syndrome)   . GERD (gastroesophageal reflux disease)   . Hypertension   . Gastritis   . Fatty liver   . History of varicella   . History of asthma   . Allergic   . Gastroparesis     Family History  Problem Relation Age of Onset  . Colon cancer Neg Hx   . Esophageal cancer Neg Hx   . Stomach cancer Neg Hx   . Rectal cancer Neg Hx   . Prostate cancer Father   . Diabetes Father   . Hypertension Father   . Diabetes Mother   . Heart disease Mother   . Arthritis Mother   . Hyperlipidemia Mother   . Hypertension Mother   . Parkinson's disease Mother   . Diabetes Paternal Grandmother   . Arthritis Paternal Grandmother   . Hyperlipidemia Paternal Grandmother   . Heart disease Paternal Grandmother   . Dementia Paternal Grandmother   . Hypertension Paternal Grandmother   . Hyperlipidemia Maternal Grandmother   . Hypertension Maternal Grandmother   . Hyperlipidemia Maternal Grandfather   . Hypertension Maternal Grandfather   . Hyperlipidemia Paternal Grandfather   . Hypertension Paternal Grandfather   . Diabetes Paternal Grandfather   . Cholelithiasis Paternal Grandfather   . Thyroid disease Mother     History   Social History  . Marital Status: Single    Spouse Name: N/A    Number of Children: N/A  . Years of Education: N/A   Social  History Main Topics  . Smoking status: Never Smoker   . Smokeless tobacco: Never Used  . Alcohol Use: No  . Drug Use: No  . Sexual Activity: None   Other Topics Concern  . None   Social History Narrative   Usually # of hours of sleep per night: 6   3 of people living at your residence? 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   Irregular sleep 7:30 to 11 or 12 and then 4 to 6:30 before she goes to work   Has attended college.   Originally from [place]   Abnormal pap remote last 2012     Outpatient Encounter Prescriptions as of 12/20/2013  Medication Sig  . Azelastine-Fluticasone (DYMISTA) 137-50 MCG/ACT SUSP Place 1 spray into the nose daily.  Marland Kitchen EPINEPHrine (EPI-PEN) 0.3 mg/0.3 mL DEVI Inject 0.3 mg into the muscle once as needed. For severe allergic reaction  . metFORMIN (GLUCOPHAGE-XR) 750 MG 24 hr tablet Take 2 tablets (1,500 mg total) by mouth daily with breakfast.  . montelukast (SINGULAIR) 10 MG tablet   . olmesartan (BENICAR) 20 MG tablet take 1 tablet by  mouth once daily  . omeprazole (PRILOSEC) 40 MG capsule Take 1 capsule (40 mg total) by mouth daily.  . pravastatin (PRAVACHOL) 20 MG tablet Take 1 tablet (20 mg total) by mouth daily.  Marland Kitchen triamterene-hydrochlorothiazide (MAXZIDE-25) 37.5-25 MG per tablet take 1 tablet by mouth once daily  . [DISCONTINUED] methocarbamol (ROBAXIN-750) 750 MG tablet Take 1 tablet (750 mg total) by mouth every 6 (six) hours as needed for muscle spasms.  . [DISCONTINUED] norgestimate-ethinyl estradiol (ORTHO-CYCLEN,SPRINTEC,PREVIFEM) 0.25-35 MG-MCG tablet Take 1 tablet by mouth daily.    EXAM:  BP 116/80  Temp(Src) 98.6 F (37 C) (Oral)  Ht 5' 10.25" (1.784 m)  Wt 282 lb (127.914 kg)  BMI 40.19 kg/m2  Body mass index is 40.19 kg/(m^2).  GENERAL: vitals reviewed and listed above, alert, oriented, appears well hydrated  and in no acute distress HEENT: atraumatic, conjunctiva  clear, no obvious abnormalities on inspection of external nose and ears MS: moves all extremities without noticeable focal  abnormality PSYCH: pleasant and cooperative, no obvious depression or anxiety Lab Results  Component Value Date   WBC 10.0 11/12/2013   HGB 12.9 11/12/2013   HCT 39.0 11/12/2013   PLT 359 11/12/2013   GLUCOSE 107* 12/06/2013   CHOL 184 06/07/2013   TRIG 129.0 06/07/2013   HDL 51.20 06/07/2013   LDLDIRECT 127.1 02/08/2013   LDLCALC 107* 06/07/2013   ALT 19 02/08/2013   AST 15 02/08/2013   NA 138 12/06/2013   K 3.9 12/06/2013   CL 98 12/06/2013   CREATININE 0.6 12/06/2013   BUN 10 12/06/2013   CO2 30 12/06/2013   TSH 0.71 09/06/2013   HGBA1C 6.8* 12/06/2013   MICROALBUR 0.2 02/08/2013   Wt Readings from Last 3 Encounters:  12/20/13 282 lb (127.914 kg)  12/06/13 276 lb (125.193 kg)  09/14/13 280 lb (127.007 kg)    ASSESSMENT AND PLAN:  Discussed the following assessment and plan:  Type II or unspecified type diabetes mellitus without mention of complication, not stated as uncontrolled - cont med and add exercise lsi   Unspecified essential hypertension - cintrolled  Other and unspecified hyperlipidemia  Need for 23-polyvalent pneumococcal polysaccharide vaccine - Plan: Pneumococcal polysaccharide vaccine 23-valent greater than or equal to 2yo subcutaneous/IM Disc flu vaccine will wait until fall  Otherwise cpx and  hgba1c in February .  Stress test pending -Patient advised to return or notify health care team  if symptoms worsen ,persist or new concerns arise.  Patient Instructions  Continue lifestyle intervention healthy eating and exercise . Continue get  Back on track.   take the pravastatin  Also. Any time of day is better than not taking at all.  fluvaccine  And pneumovax today .  Wellness visit and full set of labs and hgba1c in February when due  lst one was _0 Mariann Laster K. Panosh M.D.

## 2013-12-20 NOTE — Patient Instructions (Addendum)
Continue lifestyle intervention healthy eating and exercise . Continue get  Back on track.   take the pravastatin  Also. Any time of day is better than not taking at all.  fluvaccine  And pneumovax today .  Wellness visit and full set of labs and hgba1c in February when due  lst one was 2 23 15

## 2013-12-21 ENCOUNTER — Encounter (HOSPITAL_COMMUNITY): Payer: 59

## 2014-01-06 ENCOUNTER — Telehealth (HOSPITAL_COMMUNITY): Payer: Self-pay

## 2014-01-06 NOTE — Telephone Encounter (Signed)
Encounter complete. 

## 2014-01-11 ENCOUNTER — Encounter (HOSPITAL_COMMUNITY): Payer: 59

## 2014-01-24 ENCOUNTER — Ambulatory Visit: Payer: 59 | Admitting: Family Medicine

## 2014-02-07 ENCOUNTER — Ambulatory Visit (INDEPENDENT_AMBULATORY_CARE_PROVIDER_SITE_OTHER): Payer: 59 | Admitting: Family Medicine

## 2014-02-07 DIAGNOSIS — Z23 Encounter for immunization: Secondary | ICD-10-CM

## 2014-02-24 ENCOUNTER — Encounter: Payer: Self-pay | Admitting: Internal Medicine

## 2014-02-24 ENCOUNTER — Encounter: Payer: 59 | Admitting: Internal Medicine

## 2014-02-24 DIAGNOSIS — K3184 Gastroparesis: Secondary | ICD-10-CM | POA: Insufficient documentation

## 2014-02-24 IMAGING — CT CT HEAD W/O CM
1 series · 16 of 30 positions shown, 20 images · non-contrast
Comparison: CT of the sinuses performed 05/25/2008, and MRI of the
brain performed 05/14/2004

CLINICAL DATA: Transient right arm heaviness and general malaise.

CT HEAD WITHOUT CONTRAST
TECHNIQUE: Contiguous axial images were obtained from the base of
the skull through the vertex without contrast.

[Series 2: head routine 4.8 h37s · axial · 0.49mm/px · z∈[-146,+38]mm · 16 of 42 slices shown, 20 images]
[im 2/42  brain]
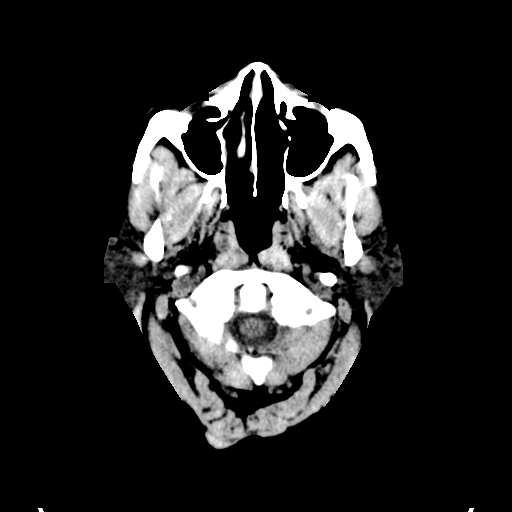
[im 2/42  bone]
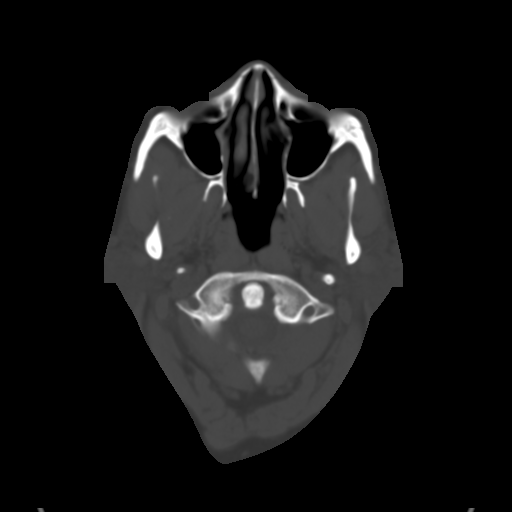
[im 5/42  brain]
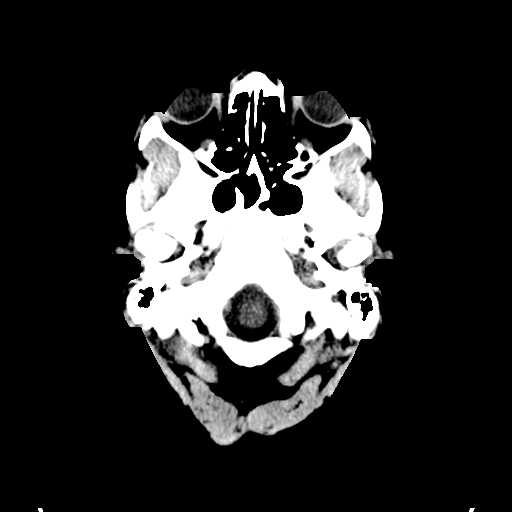
[im 8/42  brain]
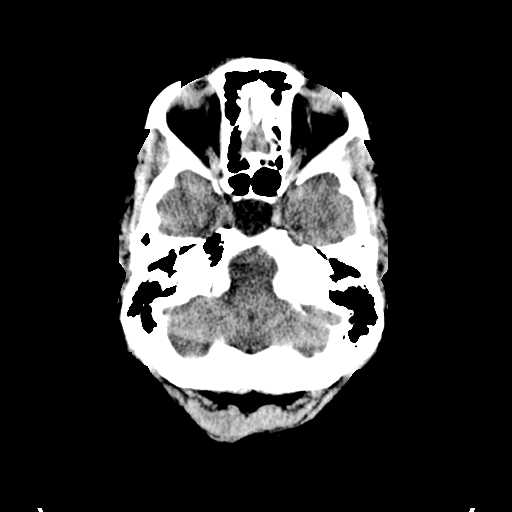
[im 10/42  brain]
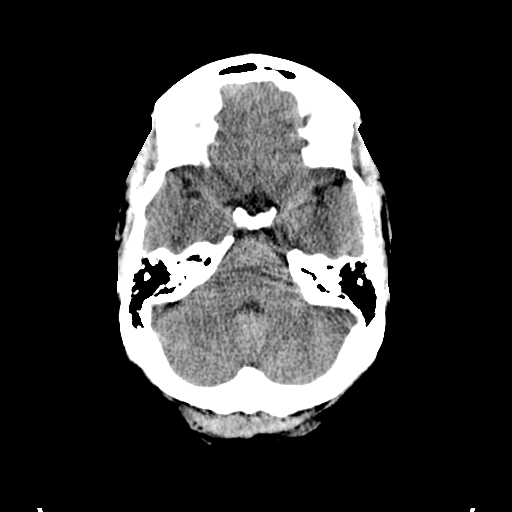
[im 12/42  brain]
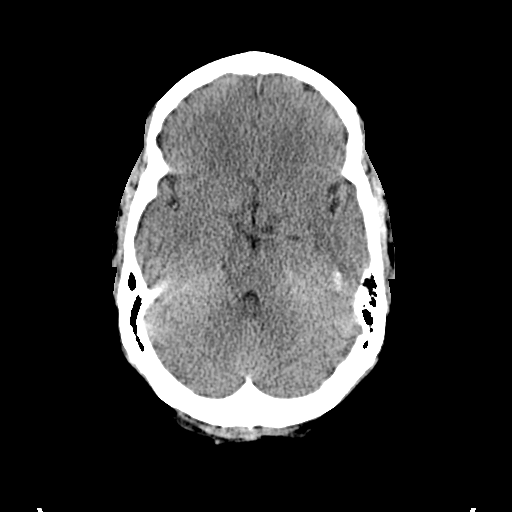
[im 12/42  bone]
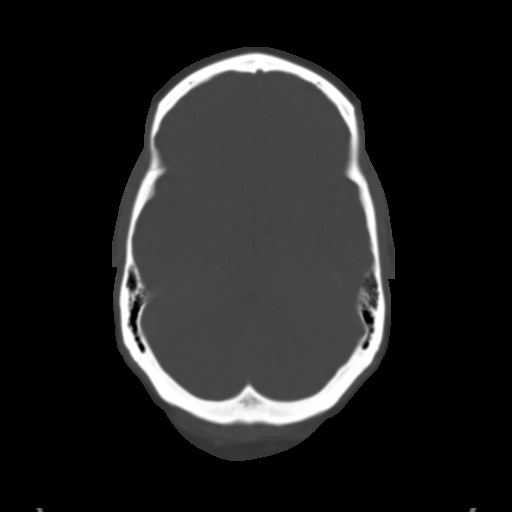
[im 15/42  brain]
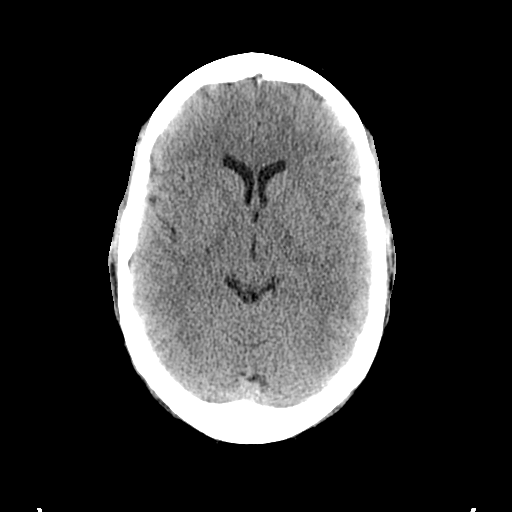
[im 17/42  brain]
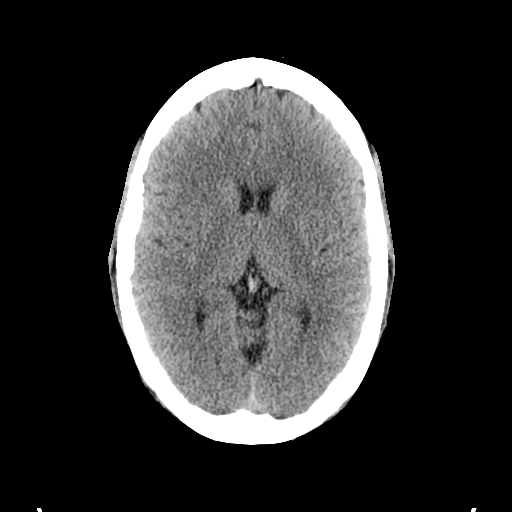
[im 20/42  brain]
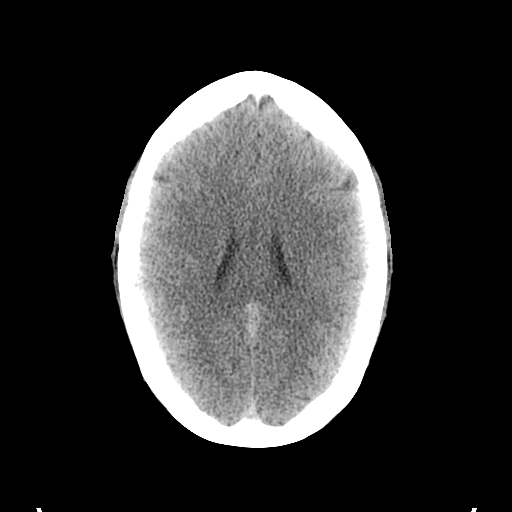
[im 22/42  brain]
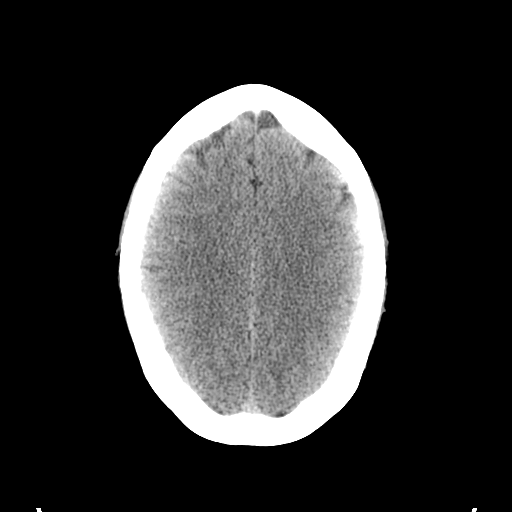
[im 22/42  bone]
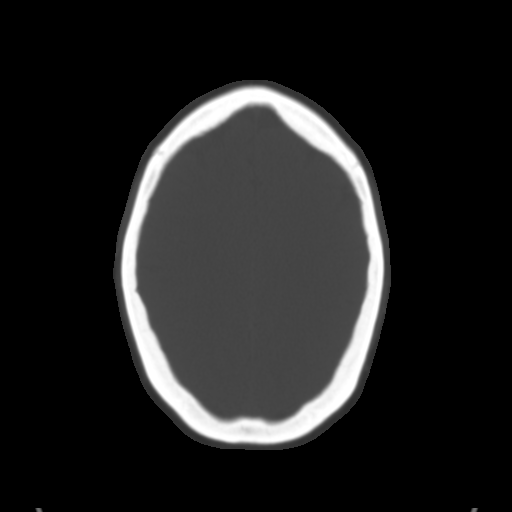
[im 25/42  brain]
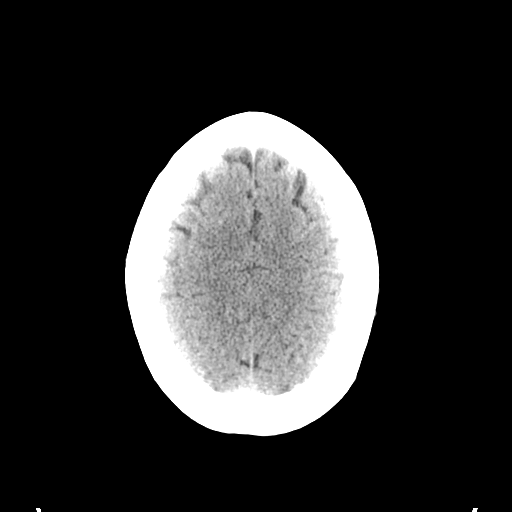
[im 27/42  brain]
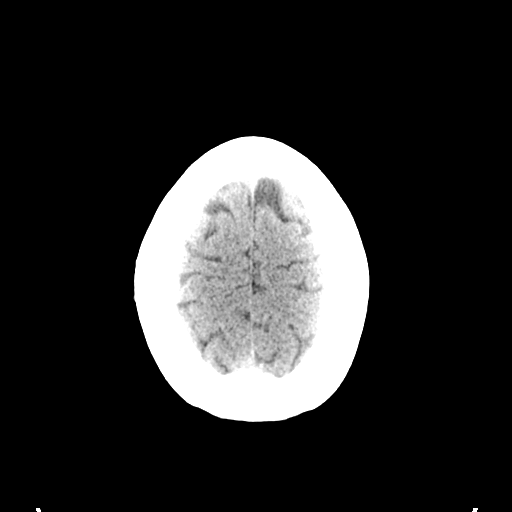
[im 30/42  brain]
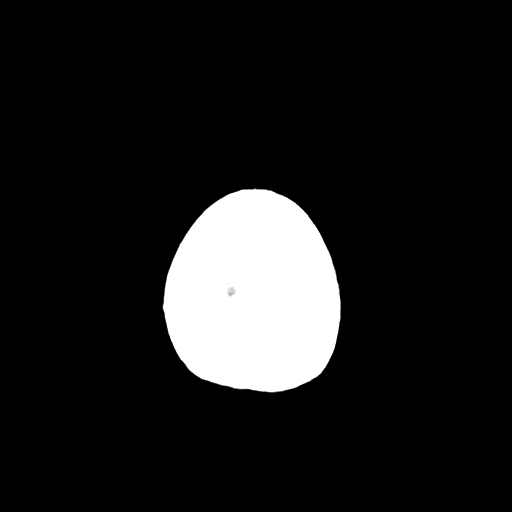
[im 32/42  brain]
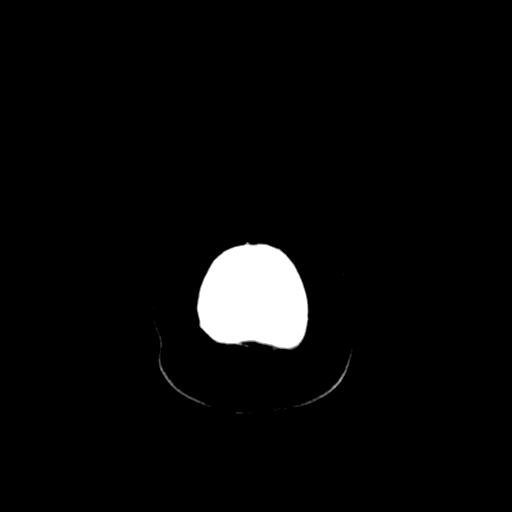
[im 32/42  bone]
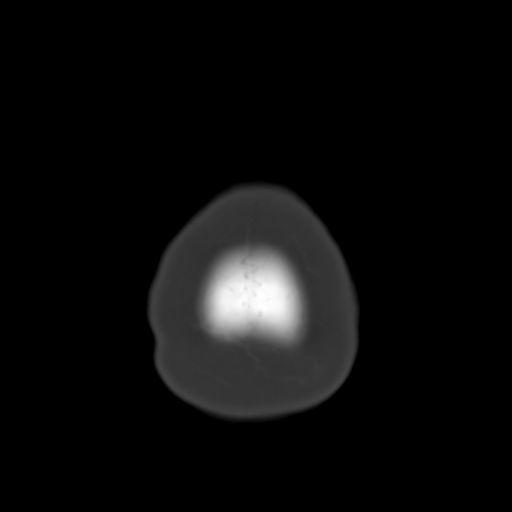
[im 34/42  brain]
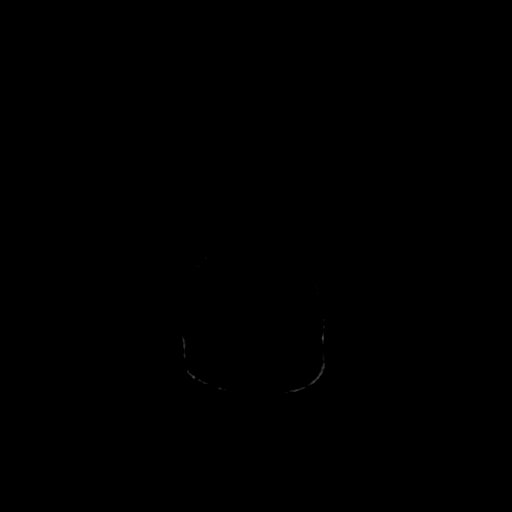
[im 37/42  brain]
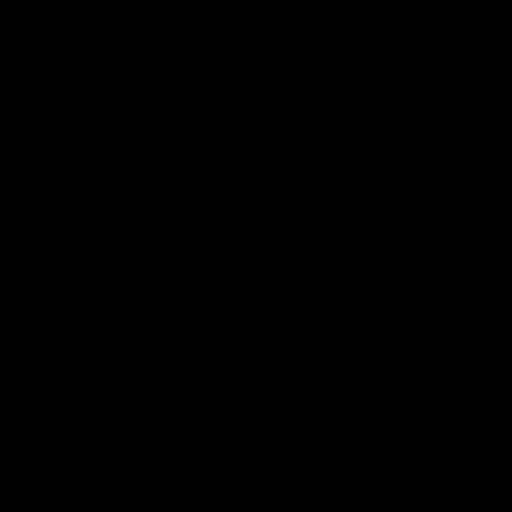
[im 40/42  brain]
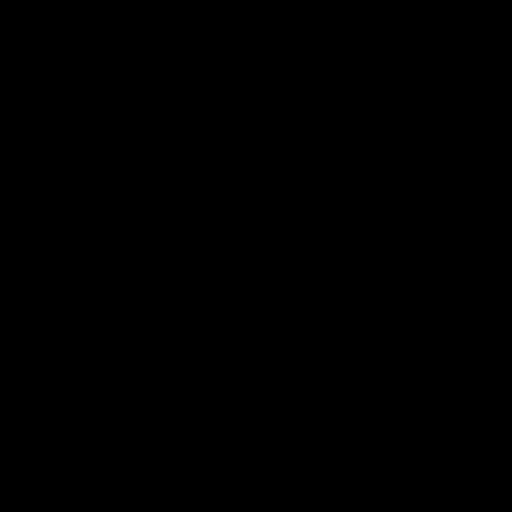

[16 of 30 positions shown; findings below may reference images not displayed]

FINDINGS: There is no evidence of acute infarction, mass lesion, or
intra- or extra-axial hemorrhage on CT.

The posterior fossa, including the cerebellum, brainstem and fourth
ventricle, is within normal limits.  The third and lateral
ventricles, and basal ganglia are unremarkable in appearance.  The
cerebral hemispheres are symmetric in appearance, with normal gray-
white differentiation.  No mass effect or midline shift is seen.

There is no evidence of fracture; visualized osseous structures are
unremarkable in appearance.  The orbits are within normal limits.
The paranasal sinuses and mastoid air cells are well-aerated.  No
significant soft tissue abnormalities are seen.
IMPRESSION: Unremarkable noncontrast CT of the head.

## 2014-02-24 NOTE — Progress Notes (Signed)
Document opened and reviewed for OV but appt  canceled same day .  

## 2014-03-02 ENCOUNTER — Ambulatory Visit: Payer: 59 | Admitting: Internal Medicine

## 2014-03-09 ENCOUNTER — Ambulatory Visit
Admission: RE | Admit: 2014-03-09 | Discharge: 2014-03-09 | Disposition: A | Discharge status: Home / Self Care | Payer: 59 | Source: Ambulatory Visit

## 2014-03-09 ENCOUNTER — Other Ambulatory Visit: Payer: Self-pay

## 2014-03-09 ENCOUNTER — Other Ambulatory Visit: Payer: Self-pay | Admitting: Obstetrics and Gynecology

## 2014-03-09 DIAGNOSIS — Z1231 Encounter for screening mammogram for malignant neoplasm of breast: Secondary | ICD-10-CM

## 2014-03-10 LAB — CYTOLOGY - PAP

## 2014-03-13 ENCOUNTER — Other Ambulatory Visit: Payer: Self-pay | Admitting: Internal Medicine

## 2014-03-14 NOTE — Telephone Encounter (Signed)
Sent to the pharmacy by e-scribe. 

## 2014-03-16 ENCOUNTER — Telehealth (HOSPITAL_COMMUNITY): Payer: Self-pay

## 2014-03-16 ENCOUNTER — Other Ambulatory Visit: Payer: Self-pay | Admitting: Internal Medicine

## 2014-03-16 NOTE — Telephone Encounter (Signed)
Encounter complete. 

## 2014-03-16 NOTE — Telephone Encounter (Signed)
Sent to the pharmacy by e-scribe.  Pt has upcoming CPX on 06/27/14

## 2014-03-22 ENCOUNTER — Encounter (HOSPITAL_COMMUNITY): Payer: 59

## 2014-03-24 ENCOUNTER — Emergency Department (HOSPITAL_COMMUNITY)
Admission: EM | Admit: 2014-03-24 | Discharge: 2014-03-24 | Disposition: A | Discharge status: Home / Self Care | Payer: 59 | Attending: Emergency Medicine | Admitting: Emergency Medicine

## 2014-03-24 ENCOUNTER — Encounter (HOSPITAL_COMMUNITY): Payer: Self-pay | Admitting: Emergency Medicine

## 2014-03-24 DIAGNOSIS — J45909 Unspecified asthma, uncomplicated: Secondary | ICD-10-CM | POA: Diagnosis not present

## 2014-03-24 DIAGNOSIS — Z792 Long term (current) use of antibiotics: Secondary | ICD-10-CM | POA: Diagnosis not present

## 2014-03-24 DIAGNOSIS — I10 Essential (primary) hypertension: Secondary | ICD-10-CM | POA: Insufficient documentation

## 2014-03-24 DIAGNOSIS — R42 Dizziness and giddiness: Secondary | ICD-10-CM | POA: Diagnosis not present

## 2014-03-24 DIAGNOSIS — E782 Mixed hyperlipidemia: Secondary | ICD-10-CM | POA: Diagnosis not present

## 2014-03-24 DIAGNOSIS — J0110 Acute frontal sinusitis, unspecified: Secondary | ICD-10-CM | POA: Diagnosis not present

## 2014-03-24 DIAGNOSIS — E119 Type 2 diabetes mellitus without complications: Secondary | ICD-10-CM | POA: Diagnosis not present

## 2014-03-24 DIAGNOSIS — Z79899 Other long term (current) drug therapy: Secondary | ICD-10-CM | POA: Insufficient documentation

## 2014-03-24 DIAGNOSIS — Z8619 Personal history of other infectious and parasitic diseases: Secondary | ICD-10-CM | POA: Insufficient documentation

## 2014-03-24 DIAGNOSIS — K219 Gastro-esophageal reflux disease without esophagitis: Secondary | ICD-10-CM | POA: Insufficient documentation

## 2014-03-24 DIAGNOSIS — R51 Headache: Secondary | ICD-10-CM | POA: Diagnosis present

## 2014-03-24 LAB — CBG MONITORING, ED: Glucose-Capillary: 129 mg/dL — ABNORMAL HIGH (ref 70–99)

## 2014-03-24 MED ORDER — CETIRIZINE HCL 10 MG PO TABS: 10.0000 mg | ORAL_TABLET | Freq: Every day | ORAL | Status: DC

## 2014-03-24 MED ORDER — AZITHROMYCIN 250 MG PO TABS: 250.0000 mg | ORAL_TABLET | Freq: Every day | ORAL | Status: DC

## 2014-03-24 MED ORDER — MECLIZINE HCL 50 MG PO TABS: 50.0000 mg | ORAL_TABLET | Freq: Three times a day (TID) | ORAL | Status: DC | PRN

## 2014-03-24 NOTE — Discharge Instructions (Signed)
Please call your doctor for a followup appointment within 24-48 hours. When you talk to your doctor please let them know that you were seen in the emergency department and have them acquire all of your records so that they can discuss the findings with you and formulate a treatment plan to fully care for your new and ongoing problems. ° °

## 2014-03-24 NOTE — ED Notes (Signed)
PT states she has had a headache today and had one single episode upon standing at 1630 today. PT denies any CP/SOB. PT states she took 162 mg of aspirin this pm for headache with no relief.

## 2014-03-24 NOTE — ED Provider Notes (Signed)
CSN: [id number]     Arrival date & time 03/24/14  1818 History   First MD Initiated Contact with Patient 03/24/14 [place]     Chief Complaint  Patient presents with  . Headache     (Consider location/radiation/quality/duration/timing/severity/associated sxs/prior Treatment) HPI Comments: The patient is a 48 year old female, she has a history of diabetes, gastroparesis and irritable bowel syndrome. She presents from home after experiencing an episode of dizziness when she went to stand up at home. She had gotten out of bed at 4:30 in the afternoon (works the graveyard shift). When she went to stand up she felt like she had no balance as if the room was spinning, she sat down and after a few minutes the symptoms went away. The patient was able drive herself to the hospital without any complaints or symptoms, denies , blurred vision, sore throat, fevers chills nausea vomiting, swelling, rashes, numbness, weakness. She states that her symptoms seem to come back on when she looks down at the floor. She has never had these symptoms in the past. She denies any history of heart difficulties, has not taken her blood sugar today.  Patient is a 48 y.o. female presenting with headaches. The history is provided by the patient.  Headache Associated symptoms: sinus pressure     Past Medical History  Diagnosis Date  . Diabetes mellitus   . High cholesterol   . IBS (irritable bowel syndrome)   . GERD (gastroesophageal reflux disease)   . Hypertension   . Gastritis   . Fatty liver   . History of varicella   . History of asthma   . Allergic   . Gastroparesis     study 11 14  nl abd [place]   Past Surgical History  Procedure Laterality Date  . Cholecystectomy    . Wisdom tooth extraction     Family History  Problem Relation Age of Onset  . Colon cancer Neg Hx   . Esophageal cancer Neg Hx   . Stomach cancer Neg Hx   . Rectal cancer Neg Hx   . Prostate cancer Father   . Diabetes Father   .  Hypertension Father   . Diabetes Mother   . Heart disease Mother   . Arthritis Mother   . Hyperlipidemia Mother   . Hypertension Mother   . Parkinson's disease Mother   . Diabetes Paternal Grandmother   . Arthritis Paternal Grandmother   . Hyperlipidemia Paternal Grandmother   . Heart disease Paternal Grandmother   . Dementia Paternal Grandmother   . Hypertension Paternal Grandmother   . Hyperlipidemia Maternal Grandmother   . Hypertension Maternal Grandmother   . Hyperlipidemia Maternal Grandfather   . Hypertension Maternal Grandfather   . Hyperlipidemia Paternal Grandfather   . Hypertension Paternal Grandfather   . Diabetes Paternal Grandfather   . Cholelithiasis Paternal Grandfather   . Thyroid disease Mother    History  Substance Use Topics  . Smoking status: Never Smoker   . Smokeless tobacco: Never Used  . Alcohol Use: No   OB History    Gravida Para Term Preterm AB TAB SAB Ectopic Multiple Living   0              Review of Systems  HENT: Positive for sinus pressure.   Neurological: Positive for headaches.      Allergies  Ace inhibitors; Shellfish allergy; Januvia; and Lipitor  Home Medications   Prior to Admission medications   Medication Sig Start Date End Date Taking?  Authorizing Provider  Azelastine-Fluticasone (DYMISTA) 137-50 MCG/ACT SUSP Place 1 spray into the nose daily. 06/25/13   Laurey Morale, MD  azithromycin (ZITHROMAX Z-PAK) 250 MG tablet Take 1 tablet (250 mg total) by mouth daily. 500mg  PO day 1, then 250mg  PO days 205 03/24/14   Johnna Acosta, MD  BENICAR 20 MG tablet take 1 tablet by mouth once daily 03/16/14   Burnis Medin, MD  cetirizine (ZYRTEC ALLERGY) 10 MG tablet Take 1 tablet (10 mg total) by mouth daily. 03/24/14   Johnna Acosta, MD  EPINEPHrine (EPI-PEN) 0.3 mg/0.3 mL DEVI Inject 0.3 mg into the muscle once as needed. For severe allergic reaction    Historical Provider, MD  meclizine (ANTIVERT) 50 MG tablet Take 1 tablet (50 mg  total) by mouth 3 (three) times daily as needed. 03/24/14   Johnna Acosta, MD  metFORMIN (GLUCOPHAGE-XR) 750 MG 24 hr tablet Take 2 tablets (1,500 mg total) by mouth daily with breakfast. 09/14/13   Burnis Medin, MD  montelukast (SINGULAIR) 10 MG tablet  09/16/12   Historical Provider, MD  omeprazole (PRILOSEC) 40 MG capsule Take 1 capsule (40 mg total) by mouth daily. 06/14/13   Burnis Medin, MD  pravastatin (PRAVACHOL) 20 MG tablet take 1 tablet by mouth once daily 03/14/14   Burnis Medin, MD  triamterene-hydrochlorothiazide (MAXZIDE-25) 37.5-25 MG per tablet take 1 tablet by mouth once daily 06/14/13   Burnis Medin, MD   BP 120/72 mmHg  Pulse 78  Temp(Src) 97.9 F (36.6 C) (Oral)  Resp 18  Ht 5' 10.75" (1.797 m)  Wt 280 lb (127.007 kg)  BMI 39.33 kg/m2  SpO2 100%  LMP 03/14/2014 Physical Exam  Constitutional: She appears well-developed and well-nourished. No distress.  HENT:  Head: Normocephalic and atraumatic.  Mouth/Throat: Oropharynx is clear and moist. No oropharyngeal exudate.  Eyes: Conjunctivae and EOM are normal. Pupils are equal, round, and reactive to light. Right eye exhibits no discharge. Left eye exhibits no discharge. No scleral icterus.  Neck: Normal range of motion. Neck supple. No JVD present. No thyromegaly present.  Cardiovascular: Normal rate, regular rhythm, normal heart sounds and intact distal pulses.  Exam reveals no gallop and no friction rub.   No murmur heard. Pulmonary/Chest: Effort normal and breath sounds normal. No respiratory distress. She has no wheezes. She has no rales.  Abdominal: Soft. Bowel sounds are normal. She exhibits no distension and no mass. There is no tenderness.  Musculoskeletal: Normal range of motion. She exhibits no edema or tenderness.  Lymphadenopathy:    She has no cervical adenopathy.  Neurological: She is alert. Coordination normal.  Unremarkable neurologic exam except for mild inducible my status with having the patient  told her head up. This reproduces her mild vertiginous symptoms. Normal strength and sensation of all 4 extremities, normal speech, normal coordination  Skin: Skin is warm and dry. No rash noted. No erythema.  Psychiatric: She has a normal mood and affect. Her behavior is normal.  Nursing note and vitals reviewed.   ED Course  Procedures (including critical care time) Labs Review Labs Reviewed  CBG MONITORING, ED - Abnormal; Notable for the following:    Glucose-Capillary 129 (*)    All other components within normal limits    Imaging Review No results found.    MDM   Final diagnoses:  Vertigo  Acute frontal sinusitis, recurrence not specified    The patient has an unremarkable exam, her vital signs are perfect,  her CBG is 129. At this time with reproducible dizziness with head movement it is likely a supple source of vertigo. She does report that she is having sinus tenderness and drainage, this mild sinusitis may be the source of her symptoms and I've encouraged a decongestant with a antibiotic to treat that. She will be discharged with those medications as well as meclizine.  Meds given in ED:  Medications - No data to display  New Prescriptions   AZITHROMYCIN (ZITHROMAX Z-PAK) 250 MG TABLET    Take 1 tablet (250 mg total) by mouth daily. 500mg  PO day 1, then 250mg  PO days 205   CETIRIZINE (ZYRTEC ALLERGY) 10 MG TABLET    Take 1 tablet (10 mg total) by mouth daily.   MECLIZINE (ANTIVERT) 50 MG TABLET    Take 1 tablet (50 mg total) by mouth 3 (three) times daily as needed.      Johnna Acosta, MD 03/24/14 1910

## 2014-04-25 ENCOUNTER — Other Ambulatory Visit: Payer: Self-pay | Admitting: Internal Medicine

## 2014-04-26 ENCOUNTER — Ambulatory Visit (HOSPITAL_COMMUNITY)
Admission: RE | Admit: 2014-04-26 | Discharge: 2014-04-26 | Disposition: A | Discharge status: Home / Self Care | Payer: 59 | Source: Ambulatory Visit | Attending: Cardiovascular Disease | Admitting: Cardiovascular Disease

## 2014-04-26 DIAGNOSIS — R079 Chest pain, unspecified: Secondary | ICD-10-CM | POA: Insufficient documentation

## 2014-04-26 DIAGNOSIS — R002 Palpitations: Secondary | ICD-10-CM | POA: Diagnosis not present

## 2014-04-26 NOTE — Telephone Encounter (Signed)
Sent to the pharmacy by e-scribe.  Pt has upcoming CPX scheduled on 06/26/13

## 2014-04-26 NOTE — Procedures (Signed)
Exercise Treadmill Test  Pre-Exercise Testing Evaluation Rhythm: normal sinus                  Test  Exercise Tolerance Test Ordering MD: Marijo File, MD    Unique Test No: 1  Treadmill:  1  Indication for ETT: chest pain - rule out ischemia  Contraindication to ETT: No   Stress Modality: exercise - treadmill  Cardiac Imaging Performed: non   Protocol: standard Bruce - maximal  Max BP:  162/100  Max MPHR (bpm):  173 85% MPR (bpm):  147  MPHR obtained (bpm):  164 % MPHR obtained:  95  Reached 85% MPHR (min:sec):  6:10 Total Exercise Time (min-sec):  7  Workload in METS:  8.5 Borg Scale: 16  Reason ETT Terminated:  dyspnea    ST Segment Analysis At Rest: normal ST segments - no evidence of significant ST depression With Exercise: no evidence of significant ST depression  Other Information Arrhythmia:  No Angina during ETT:  absent (0) Quality of ETT:  diagnostic  ETT Interpretation:  normal - no evidence of ischemia by ST analysis  Comments: Nl GXT  Recommendations: Follow up with Dr. Percival Spanish

## 2014-05-09 ENCOUNTER — Ambulatory Visit: Payer: 59 | Admitting: Internal Medicine

## 2014-05-17 ENCOUNTER — Emergency Department (HOSPITAL_COMMUNITY): Payer: 59

## 2014-05-17 ENCOUNTER — Encounter (HOSPITAL_COMMUNITY): Payer: Self-pay | Admitting: *Deleted

## 2014-05-17 ENCOUNTER — Emergency Department (HOSPITAL_COMMUNITY)
Admission: EM | Admit: 2014-05-17 | Discharge: 2014-05-17 | Disposition: A | Discharge status: Home / Self Care | Payer: 59 | Attending: Emergency Medicine | Admitting: Emergency Medicine

## 2014-05-17 DIAGNOSIS — J45901 Unspecified asthma with (acute) exacerbation: Secondary | ICD-10-CM | POA: Diagnosis not present

## 2014-05-17 DIAGNOSIS — I10 Essential (primary) hypertension: Secondary | ICD-10-CM | POA: Diagnosis not present

## 2014-05-17 DIAGNOSIS — E119 Type 2 diabetes mellitus without complications: Secondary | ICD-10-CM | POA: Diagnosis not present

## 2014-05-17 DIAGNOSIS — E780 Pure hypercholesterolemia: Secondary | ICD-10-CM | POA: Diagnosis not present

## 2014-05-17 DIAGNOSIS — K219 Gastro-esophageal reflux disease without esophagitis: Secondary | ICD-10-CM | POA: Diagnosis not present

## 2014-05-17 DIAGNOSIS — Z79899 Other long term (current) drug therapy: Secondary | ICD-10-CM | POA: Insufficient documentation

## 2014-05-17 DIAGNOSIS — J209 Acute bronchitis, unspecified: Secondary | ICD-10-CM

## 2014-05-17 DIAGNOSIS — R0602 Shortness of breath: Secondary | ICD-10-CM | POA: Diagnosis present

## 2014-05-17 LAB — COMPREHENSIVE METABOLIC PANEL
ALT: 22 U/L (ref 0–35)
AST: 20 U/L (ref 0–37)
Albumin: 3.8 g/dL (ref 3.5–5.2)
Alkaline Phosphatase: 53 U/L (ref 39–117)
Anion gap: 7 (ref 5–15)
BUN: 11 mg/dL (ref 6–23)
CO2: 26 mmol/L (ref 19–32)
Calcium: 9.1 mg/dL (ref 8.4–10.5)
Chloride: 102 mmol/L (ref 96–112)
Creatinine, Ser: 0.67 mg/dL (ref 0.50–1.10)
GFR calc Af Amer: >90 mL/min (ref >90)
GFR calc non Af Amer: >90 mL/min (ref >90)
Glucose, Bld: 131 mg/dL — ABNORMAL HIGH (ref 70–99)
Potassium: 3.7 mmol/L (ref 3.5–5.1)
Sodium: 135 mmol/L (ref 135–145)
Total Bilirubin: 0.5 mg/dL (ref 0.3–1.2)
Total Protein: 8 g/dL (ref 6.0–8.3)

## 2014-05-17 LAB — CBC
HCT: 37.2 % (ref 36.0–46.0)
Hemoglobin: 11.8 g/dL — ABNORMAL LOW (ref 12.0–15.0)
MCH: 26.1 pg (ref 26.0–34.0)
MCHC: 31.7 g/dL (ref 30.0–36.0)
MCV: 82.3 fL (ref 78.0–100.0)
Platelets: 330 10*3/uL (ref 150–400)
RBC: 4.52 MIL/uL (ref 3.87–5.11)
RDW: 15.5 % (ref 11.5–15.5)
WBC: 6.7 10*3/uL (ref 4.0–10.5)

## 2014-05-17 MED ORDER — ALBUTEROL SULFATE (2.5 MG/3ML) 0.083% IN NEBU
2.5000 mg | INHALATION_SOLUTION | Freq: Once | RESPIRATORY_TRACT | Status: AC
  Administered 2014-05-17: 2.5 mg via RESPIRATORY_TRACT
  Filled 2014-05-17: qty 3

## 2014-05-17 MED ORDER — IPRATROPIUM-ALBUTEROL 0.5-2.5 (3) MG/3ML IN SOLN
3.0000 mL | Freq: Once | RESPIRATORY_TRACT | Status: AC
  Administered 2014-05-17: 3 mL via RESPIRATORY_TRACT
  Filled 2014-05-17: qty 3

## 2014-05-17 MED ORDER — ALBUTEROL SULFATE HFA 108 (90 BASE) MCG/ACT IN AERS: 2.0000 | INHALATION_SPRAY | RESPIRATORY_TRACT | Status: DC | PRN

## 2014-05-17 MED ORDER — DM-GUAIFENESIN ER 30-600 MG PO TB12
1.0000 | ORAL_TABLET | Freq: Two times a day (BID) | ORAL | Status: DC
  Administered 2014-05-17: 1 via ORAL
  Filled 2014-05-17 (×2): qty 1

## 2014-05-17 MED ORDER — AZITHROMYCIN 250 MG PO TABS: 250.0000 mg | ORAL_TABLET | Freq: Every day | ORAL | Status: DC

## 2014-05-17 MED ORDER — IPRATROPIUM BROMIDE 0.02 % IN SOLN: 0.5000 mg | Freq: Once | RESPIRATORY_TRACT | Status: DC

## 2014-05-17 MED ORDER — DM-GUAIFENESIN ER 30-600 MG PO TB12: 1.0000 | ORAL_TABLET | Freq: Two times a day (BID) | ORAL | Status: DC | PRN

## 2014-05-17 MED ORDER — ALBUTEROL SULFATE (2.5 MG/3ML) 0.083% IN NEBU: 5.0000 mg | INHALATION_SOLUTION | Freq: Once | RESPIRATORY_TRACT | Status: DC

## 2014-05-17 MED ORDER — PREDNISONE 20 MG PO TABS: ORAL_TABLET | ORAL | Status: DC

## 2014-05-17 MED ORDER — PREDNISONE 20 MG PO TABS
40.0000 mg | ORAL_TABLET | Freq: Once | ORAL | Status: AC
  Administered 2014-05-17: 40 mg via ORAL
  Filled 2014-05-17: qty 2

## 2014-05-17 NOTE — Discharge Instructions (Signed)
Continue using your inhaler for your wheezing. Take the medications as prescribed. Recheck if you get worse such as high fever, struggling to breathe, chest pain.    Acute Bronchitis Bronchitis is inflammation of the airways that extend from the windpipe into the lungs (bronchi). The inflammation often causes mucus to develop. This leads to a cough, which is the most common symptom of bronchitis.  In acute bronchitis, the condition usually develops suddenly and goes away over time, usually in a couple weeks. Smoking, allergies, and asthma can make bronchitis worse. Repeated episodes of bronchitis may cause further lung problems.  CAUSES Acute bronchitis is most often caused by the same virus that causes a cold. The virus can spread from person to person (contagious) through coughing, sneezing, and touching contaminated objects. SIGNS AND SYMPTOMS   Cough.   Fever.   Coughing up mucus.   Body aches.   Chest congestion.   Chills.   Shortness of breath.   Sore throat.  DIAGNOSIS  Acute bronchitis is usually diagnosed through a physical exam. Your health care provider will also ask you questions about your medical history. Tests, such as chest X-rays, are sometimes done to rule out other conditions.  TREATMENT  Acute bronchitis usually goes away in a couple weeks. Oftentimes, no medical treatment is necessary. Medicines are sometimes given for relief of fever or cough. Antibiotic medicines are usually not needed but may be prescribed in certain situations. In some cases, an inhaler may be recommended to help reduce shortness of breath and control the cough. A cool mist vaporizer may also be used to help thin bronchial secretions and make it easier to clear the chest.  HOME CARE INSTRUCTIONS  Get plenty of rest.   Drink enough fluids to keep your urine clear or pale yellow (unless you have a medical condition that requires fluid restriction). Increasing fluids may help thin your  respiratory secretions (sputum) and reduce chest congestion, and it will prevent dehydration.   Take medicines only as directed by your health care provider.  If you were prescribed an antibiotic medicine, finish it all even if you start to feel better.  Avoid smoking and secondhand smoke. Exposure to cigarette smoke or irritating chemicals will make bronchitis worse. If you are a smoker, consider using nicotine gum or skin patches to help control withdrawal symptoms. Quitting smoking will help your lungs heal faster.   Reduce the chances of another bout of acute bronchitis by washing your hands frequently, avoiding people with cold symptoms, and trying not to touch your hands to your mouth, nose, or eyes.   Keep all follow-up visits as directed by your health care provider.  SEEK MEDICAL CARE IF: Your symptoms do not improve after 1 week of treatment.  SEEK IMMEDIATE MEDICAL CARE IF:  You develop an increased fever or chills.   You have chest pain.   You have severe shortness of breath.  You have bloody sputum.   You develop dehydration.  You faint or repeatedly feel like you are going to pass out.  You develop repeated vomiting.  You develop a severe headache. MAKE SURE YOU:   Understand these instructions.  Will watch your condition.  Will get help right away if you are not doing well or get worse. Document Released: 05/16/2004 Document Revised: 08/23/2013 Document Reviewed: 09/29/2012 [facility] Patient Information 2015 [employer], [place]. This information is not intended to replace advice given to you by your health care provider. Make sure you discuss any questions you have  with your health care provider. ° °

## 2014-05-17 NOTE — ED Notes (Addendum)
Pt reporting cough and SOB since yesterday.  Unsure if she has been running a fever or not. Reports hearing some "crackling and wheezing when I lay down"  States that it did appear to be some blood in sputum.  Pt reporting generalized body aches and fatigue as well.

## 2014-05-17 NOTE — ED Provider Notes (Signed)
CSN: [id number]     Arrival date & time 05/17/14  0043 History   First MD Initiated Contact with Patient 05/17/14 0146     Chief Complaint  Patient presents with  . Shortness of Breath     (Consider location/radiation/quality/duration/timing/severity/associated sxs/prior Treatment) HPI  Patient reports a history asthma and she is currently taking Singulair and also taking Zyrtec for allergies. She reports January 25 she started having coughing and has no energy. She has shortness of breath and dyspnea on exertion. She states whenever she takes any medication she gets a burning feeling in her chest. She states she has been wheezing and she uses her inhaler which only helps temporarily. She states she's having clear rhinorrhea and she's coughing up yellow mucus that has specks of blood in it. She is unsure of fever but states she's been having chills. She denies any sore throat. She denies being around anybody else who is ill.  PCP Dr Regis Bill  Past Medical History  Diagnosis Date  . Diabetes mellitus   . High cholesterol   . IBS (irritable bowel syndrome)   . GERD (gastroesophageal reflux disease)   . Hypertension   . Gastritis   . Fatty liver   . History of varicella   . History of asthma   . Allergic   . Gastroparesis     study 11 14  nl abd [place]   Past Surgical History  Procedure Laterality Date  . Cholecystectomy    . Wisdom tooth extraction     Family History  Problem Relation Age of Onset  . Colon cancer Neg Hx   . Esophageal cancer Neg Hx   . Stomach cancer Neg Hx   . Rectal cancer Neg Hx   . Prostate cancer Father   . Diabetes Father   . Hypertension Father   . Diabetes Mother   . Heart disease Mother   . Arthritis Mother   . Hyperlipidemia Mother   . Hypertension Mother   . Parkinson's disease Mother   . Diabetes Paternal Grandmother   . Arthritis Paternal Grandmother   . Hyperlipidemia Paternal Grandmother   . Heart disease Paternal Grandmother   . Dementia  Paternal Grandmother   . Hypertension Paternal Grandmother   . Hyperlipidemia Maternal Grandmother   . Hypertension Maternal Grandmother   . Hyperlipidemia Maternal Grandfather   . Hypertension Maternal Grandfather   . Hyperlipidemia Paternal Grandfather   . Hypertension Paternal Grandfather   . Diabetes Paternal Grandfather   . Cholelithiasis Paternal Grandfather   . Thyroid disease Mother    History  Substance Use Topics  . Smoking status: Never Smoker   . Smokeless tobacco: Never Used  . Alcohol Use: No   Lives at home Lives with spouse No second hand smoke Works sorting mail  OB History    Gravida Para Term Preterm AB TAB SAB Ectopic Multiple Living   0              Review of Systems  All other systems reviewed and are negative.     Allergies  Ace inhibitors; Shellfish allergy; Januvia; and Lipitor  Home Medications   Prior to Admission medications   Medication Sig Start Date End Date Taking? Authorizing Provider  Azelastine-Fluticasone (DYMISTA) 137-50 MCG/ACT SUSP Place 1 spray into the nose daily. 06/25/13  Yes Laurey Morale, MD  BENICAR 20 MG tablet take 1 tablet by mouth once daily 03/16/14  Yes Burnis Medin, MD  cetirizine (ZYRTEC ALLERGY) 10 MG tablet  Take 1 tablet (10 mg total) by mouth daily. 03/24/14  Yes Johnna Acosta, MD  metFORMIN (GLUCOPHAGE-XR) 750 MG 24 hr tablet TAKE 2 TABLETS BY MOUTH DAILY WITH BREAKFAST 04/26/14  Yes Burnis Medin, MD  omeprazole (PRILOSEC) 40 MG capsule Take 1 capsule (40 mg total) by mouth daily. 06/14/13  Yes Burnis Medin, MD  pravastatin (PRAVACHOL) 20 MG tablet take 1 tablet by mouth once daily 03/14/14  Yes Burnis Medin, MD  triamterene-hydrochlorothiazide (MAXZIDE-25) 37.5-25 MG per tablet take 1 tablet by mouth once daily 06/14/13  Yes Burnis Medin, MD  EPINEPHrine (EPI-PEN) 0.3 mg/0.3 mL DEVI Inject 0.3 mg into the muscle once as needed. For severe allergic reaction    Historical Provider, MD  meclizine (ANTIVERT)  50 MG tablet Take 1 tablet (50 mg total) by mouth 3 (three) times daily as needed. 03/24/14   Johnna Acosta, MD  montelukast (SINGULAIR) 10 MG tablet  09/16/12   Historical Provider, MD   BP 125/78 mmHg  Pulse 92  Temp(Src) 99.2 F (37.3 C) (Oral)  Resp 20  Ht 5\' 10"  (1.778 m)  Wt 274 lb (124.286 kg)  BMI 39.32 kg/m2  SpO2 94%  LMP 05/10/2014  Vital signs normal except low-grade temp  Physical Exam  Constitutional: She is oriented to person, place, and time. She appears well-developed and well-nourished.  Non-toxic appearance. She does not appear ill. No distress.  HENT:  Head: Normocephalic and atraumatic.  Right Ear: External ear normal.  Left Ear: External ear normal.  Nose: Nose normal. No mucosal edema or rhinorrhea.  Mouth/Throat: Oropharynx is clear and moist and mucous membranes are normal. No dental abscesses or uvula swelling.  Eyes: Conjunctivae and EOM are normal. Pupils are equal, round, and reactive to light.  Neck: Normal range of motion and full passive range of motion without pain. Neck supple.  Cardiovascular: Normal rate, regular rhythm and normal heart sounds.  Exam reveals no gallop and no friction rub.   No murmur heard. Pulmonary/Chest: Effort normal. No respiratory distress. She has decreased breath sounds. She has no rhonchi. She has no rales. She exhibits no tenderness and no crepitus.  Rare wheeze, coughing  Abdominal: Soft. Normal appearance and bowel sounds are normal. She exhibits no distension. There is no tenderness. There is no rebound and no guarding.  Musculoskeletal: Normal range of motion. She exhibits no edema or tenderness.  Moves all extremities well.   Neurological: She is alert and oriented to person, place, and time. She has normal strength. No cranial nerve deficit.  Skin: Skin is warm, dry and intact. No rash noted. No erythema. No pallor.  Psychiatric: She has a normal mood and affect. Her speech is normal and behavior is normal. Her  mood appears not anxious.  Nursing note and vitals reviewed.   ED Course  Procedures (including critical care time) Medications  dextromethorphan-guaiFENesin (MUCINEX DM) 30-600 MG per 12 hr tablet 1 tablet (1 tablet Oral Given 05/17/14 0206)  predniSONE (DELTASONE) tablet 40 mg (40 mg Oral Given 05/17/14 0206)  ipratropium-albuterol (DUONEB) 0.5-2.5 (3) MG/3ML nebulizer solution 3 mL (3 mLs Nebulization Given 05/17/14 0213)  albuterol (PROVENTIL) (2.5 MG/3ML) 0.083% nebulizer solution 2.5 mg (2.5 mg Nebulization Given 05/17/14 0213)  ipratropium-albuterol (DUONEB) 0.5-2.5 (3) MG/3ML nebulizer solution 3 mL (3 mLs Nebulization Given 05/17/14 0350)  albuterol (PROVENTIL) (2.5 MG/3ML) 0.083% nebulizer solution 2.5 mg (2.5 mg Nebulization Given 05/17/14 0350)    Patient was rechecked at 3:30 AM she has improved air  movement. She has rare rhonchi. She feels ready for discharge. She was given a second nebulizer while preparing her discharge papers. We discussed going on low-dose prednisone, there was some concern about her diabetes.  Labs Review Results for orders placed or performed during the hospital encounter of 05/17/14  CBC  Result Value Ref Range   WBC 6.7 4.0 - 10.5 K/uL   RBC 4.52 3.87 - 5.11 MIL/uL   Hemoglobin 11.8 (L) 12.0 - 15.0 g/dL   HCT 37.2 36.0 - 46.0 %   MCV 82.3 78.0 - 100.0 fL   MCH 26.1 26.0 - 34.0 pg   MCHC 31.7 30.0 - 36.0 g/dL   RDW 15.5 11.5 - 15.5 %   Platelets 330 150 - 400 K/uL  Comprehensive metabolic panel  Result Value Ref Range   Sodium 135 135 - 145 mmol/L   Potassium 3.7 3.5 - 5.1 mmol/L   Chloride 102 96 - 112 mmol/L   CO2 26 19 - 32 mmol/L   Glucose, Bld 131 (H) 70 - 99 mg/dL   BUN 11 6 - 23 mg/dL   Creatinine, Ser 0.67 0.50 - 1.10 mg/dL   Calcium 9.1 8.4 - 10.5 mg/dL   Total Protein 8.0 6.0 - 8.3 g/dL   Albumin 3.8 3.5 - 5.2 g/dL   AST 20 0 - 37 U/L   ALT 22 0 - 35 U/L   Alkaline Phosphatase 53 39 - 117 U/L   Total Bilirubin 0.5 0.3 - 1.2 mg/dL    GFR calc non Af Amer >90 >90 mL/min   GFR calc Af Amer >90 >90 mL/min   Anion gap 7 5 - 15   Laboratory interpretation all normal except mild anemia, hyperglycemia     Imaging Review Dg Chest 2 View  05/17/2014   CLINICAL DATA:  Cough, shortness of breath, and wheezing since yesterday.  EXAM: CHEST  2 VIEW  COMPARISON:  11/12/2013  FINDINGS: Shallow inspiration with probable linear atelectasis in the left lung base. Normal heart size and pulmonary vascularity. No focal consolidation in the lungs. No blunting of costophrenic angles. No pneumothorax. Mediastinal contours appear intact.  IMPRESSION: Atelectasis in the left lung base.   Electronically Signed   By: Lucienne Capers M.D.   On: 05/17/2014 02:44     EKG Interpretation None      MDM   Final diagnoses:  Bronchitis with bronchospasm   New Prescriptions   ALBUTEROL (PROVENTIL HFA;VENTOLIN HFA) 108 (90 BASE) MCG/ACT INHALER    Inhale 2 puffs into the lungs every 4 (four) hours as needed.   AZITHROMYCIN (ZITHROMAX) 250 MG TABLET    Take 1 tablet (250 mg total) by mouth daily.   DEXTROMETHORPHAN-GUAIFENESIN (MUCINEX DM) 30-600 MG PER 12 HR TABLET    Take 1 tablet by mouth 2 (two) times daily as needed for cough.   PREDNISONE (DELTASONE) 20 MG TABLET    Take 2 po QD x 3d then 1 po QD x 2 d    Plan discharge  Rolland Porter, MD, Alanson Aly, MD 05/17/14 [phone]

## 2014-06-01 ENCOUNTER — Other Ambulatory Visit: Payer: Self-pay | Admitting: Allergy

## 2014-06-01 ENCOUNTER — Ambulatory Visit
Admission: RE | Admit: 2014-06-01 | Discharge: 2014-06-01 | Disposition: A | Discharge status: Home / Self Care | Payer: 59 | Source: Ambulatory Visit | Attending: Allergy | Admitting: Allergy

## 2014-06-01 DIAGNOSIS — J209 Acute bronchitis, unspecified: Secondary | ICD-10-CM

## 2014-06-19 ENCOUNTER — Other Ambulatory Visit: Payer: Self-pay | Admitting: Internal Medicine

## 2014-06-20 ENCOUNTER — Other Ambulatory Visit: Payer: 59 | Admitting: Internal Medicine

## 2014-06-20 ENCOUNTER — Other Ambulatory Visit: Payer: 59

## 2014-06-20 NOTE — Telephone Encounter (Signed)
Sent to the pharmacy by e-scribe.  Pt has upcoming CPX scheduled for 08/10/14

## 2014-06-22 ENCOUNTER — Other Ambulatory Visit (HOSPITAL_COMMUNITY): Payer: Self-pay | Admitting: Orthopedic Surgery

## 2014-06-22 DIAGNOSIS — M79604 Pain in right leg: Secondary | ICD-10-CM

## 2014-06-27 ENCOUNTER — Encounter: Payer: 59 | Admitting: Internal Medicine

## 2014-07-17 ENCOUNTER — Other Ambulatory Visit: Payer: Self-pay | Admitting: Internal Medicine

## 2014-07-18 NOTE — Telephone Encounter (Signed)
Sent to the pharmacy by e-scribe. 

## 2014-07-24 ENCOUNTER — Other Ambulatory Visit: Payer: Self-pay | Admitting: Internal Medicine

## 2014-07-25 NOTE — Telephone Encounter (Signed)
Sen to the pharmacy by e-scribe.  Pt has CPX on 08/10/14

## 2014-08-02 ENCOUNTER — Other Ambulatory Visit: Payer: 59

## 2014-08-03 ENCOUNTER — Other Ambulatory Visit: Payer: 59

## 2014-08-10 ENCOUNTER — Encounter: Payer: 59 | Admitting: Internal Medicine

## 2014-08-11 ENCOUNTER — Other Ambulatory Visit: Payer: Self-pay | Admitting: Internal Medicine

## 2014-08-12 NOTE — Telephone Encounter (Signed)
She has canceled cpx and labs x 3?  She is overdue for a1c lipid panel  And urin microalbumin diabetes management  Record if other provider is following the diabetes and get notes  Otherwise she needs ROV  To address these issues . Can  Give 14 capsules   No refills   Lab Results  Component Value Date   WBC 6.7 05/17/2014   HGB 11.8* 05/17/2014   HCT 37.2 05/17/2014   PLT 330 05/17/2014   GLUCOSE 131* 05/17/2014   CHOL 184 06/07/2013   TRIG 129.0 06/07/2013   HDL 51.20 06/07/2013   LDLDIRECT 127.1 02/08/2013   LDLCALC 107* 06/07/2013   ALT 22 05/17/2014   AST 20 05/17/2014   NA 135 05/17/2014   K 3.7 05/17/2014   CL 102 05/17/2014   CREATININE 0.67 05/17/2014   BUN 11 05/17/2014   CO2 26 05/17/2014   TSH 0.71 09/06/2013   HGBA1C 6.8* 12/06/2013   MICROALBUR 0.2 02/08/2013

## 2014-08-15 NOTE — Telephone Encounter (Signed)
#  14 Sent to the pharmacy by e-scribe.

## 2014-08-18 ENCOUNTER — Ambulatory Visit (INDEPENDENT_AMBULATORY_CARE_PROVIDER_SITE_OTHER): Payer: 59

## 2014-08-18 DIAGNOSIS — B351 Tinea unguium: Secondary | ICD-10-CM

## 2014-08-18 DIAGNOSIS — M79673 Pain in unspecified foot: Secondary | ICD-10-CM

## 2014-08-22 ENCOUNTER — Other Ambulatory Visit: Payer: Self-pay | Admitting: Internal Medicine

## 2014-08-22 NOTE — Telephone Encounter (Signed)
Pt has cancelled the last 4 appts  . She is overdue   Can come for ov and do labs at visit if makes easier on her  Please contact her about why having a hard time with appts . Refill 1 month no refills  Until seen    Lab Results  Component Value Date   WBC 6.7 05/17/2014   HGB 11.8* 05/17/2014   HCT 37.2 05/17/2014   PLT 330 05/17/2014   GLUCOSE 131* 05/17/2014   CHOL 184 06/07/2013   TRIG 129.0 06/07/2013   HDL 51.20 06/07/2013   LDLDIRECT 127.1 02/08/2013   LDLCALC 107* 06/07/2013   ALT 22 05/17/2014   AST 20 05/17/2014   NA 135 05/17/2014   K 3.7 05/17/2014   CL 102 05/17/2014   CREATININE 0.67 05/17/2014   BUN 11 05/17/2014   CO2 26 05/17/2014   TSH 0.71 09/06/2013   HGBA1C 6.8* 12/06/2013   MICROALBUR 0.2 02/08/2013

## 2014-08-23 MED ORDER — OLMESARTAN MEDOXOMIL 20 MG PO TABS: 20.0000 mg | ORAL_TABLET | Freq: Every day | ORAL | Status: DC

## 2014-08-23 MED ORDER — METFORMIN HCL ER 750 MG PO TB24: 1500.0000 mg | ORAL_TABLET | Freq: Every morning | ORAL | Status: DC

## 2014-08-23 NOTE — Telephone Encounter (Signed)
Spoke to the pt.  She has been out of tow with her mother who is ill.  Informed her that I was sending a 30 day supply to the pharmacy.  She will call back for an appt.

## 2014-08-23 NOTE — Telephone Encounter (Signed)
Sent to the pharmacy by e-scribe. 

## 2014-08-23 NOTE — Progress Notes (Signed)
Subjective:     Patient ID: Maria Sharp, female   DOB: 02-Aug-1965, 49 y.o.   MRN: [id number]  HPI patient presents in poor health with nail disease 1-5 both feet that are thick and impossible for her to cut   Review of Systems     Objective:   Physical Exam Neurovascular status intact with thick painful nailbeds 1-5 both feet that are incurvated in the corners    Assessment:     Mycotic nail infection with pain 1-5 both feet    Plan:     Debridement of painful nailbeds 1-5 both feet with no iatrogenic bleeding noted

## 2014-09-22 ENCOUNTER — Ambulatory Visit (INDEPENDENT_AMBULATORY_CARE_PROVIDER_SITE_OTHER): Payer: 59 | Admitting: Family Medicine

## 2014-09-22 VITALS — BP 114/62 | HR 84 | Temp 98.6°F | Resp 18 | Ht 70.0 in | Wt 267.0 lb

## 2014-09-22 DIAGNOSIS — I868 Varicose veins of other specified sites: Secondary | ICD-10-CM | POA: Diagnosis not present

## 2014-09-22 DIAGNOSIS — I10 Essential (primary) hypertension: Secondary | ICD-10-CM | POA: Diagnosis not present

## 2014-09-22 DIAGNOSIS — Z124 Encounter for screening for malignant neoplasm of cervix: Secondary | ICD-10-CM | POA: Diagnosis not present

## 2014-09-22 DIAGNOSIS — K3184 Gastroparesis: Secondary | ICD-10-CM

## 2014-09-22 DIAGNOSIS — B0089 Other herpesviral infection: Secondary | ICD-10-CM | POA: Diagnosis not present

## 2014-09-22 DIAGNOSIS — J328 Other chronic sinusitis: Secondary | ICD-10-CM

## 2014-09-22 DIAGNOSIS — Z205 Contact with and (suspected) exposure to viral hepatitis: Secondary | ICD-10-CM

## 2014-09-22 DIAGNOSIS — E1159 Type 2 diabetes mellitus with other circulatory complications: Secondary | ICD-10-CM

## 2014-09-22 DIAGNOSIS — E1151 Type 2 diabetes mellitus with diabetic peripheral angiopathy without gangrene: Secondary | ICD-10-CM

## 2014-09-22 DIAGNOSIS — K589 Irritable bowel syndrome without diarrhea: Secondary | ICD-10-CM | POA: Diagnosis not present

## 2014-09-22 DIAGNOSIS — E559 Vitamin D deficiency, unspecified: Secondary | ICD-10-CM | POA: Diagnosis not present

## 2014-09-22 DIAGNOSIS — Z0000 Encounter for general adult medical examination without abnormal findings: Secondary | ICD-10-CM

## 2014-09-22 DIAGNOSIS — J4530 Mild persistent asthma, uncomplicated: Secondary | ICD-10-CM

## 2014-09-22 DIAGNOSIS — E669 Obesity, unspecified: Secondary | ICD-10-CM

## 2014-09-22 DIAGNOSIS — R252 Cramp and spasm: Secondary | ICD-10-CM | POA: Diagnosis not present

## 2014-09-22 DIAGNOSIS — I8390 Asymptomatic varicose veins of unspecified lower extremity: Secondary | ICD-10-CM

## 2014-09-22 DIAGNOSIS — E785 Hyperlipidemia, unspecified: Secondary | ICD-10-CM | POA: Diagnosis not present

## 2014-09-22 DIAGNOSIS — Z79899 Other long term (current) drug therapy: Secondary | ICD-10-CM

## 2014-09-22 LAB — COMPREHENSIVE METABOLIC PANEL
ALT: 12 U/L (ref 0–35)
AST: 11 U/L (ref 0–37)
Albumin: 4.2 g/dL (ref 3.5–5.2)
Alkaline Phosphatase: 44 U/L (ref 39–117)
BUN: 12 mg/dL (ref 6–23)
CO2: 28 mEq/L (ref 19–32)
Calcium: 9.5 mg/dL (ref 8.4–10.5)
Chloride: 99 mEq/L (ref 96–112)
Creat: 0.6 mg/dL (ref 0.50–1.10)
Glucose, Bld: 117 mg/dL — ABNORMAL HIGH (ref 70–99)
Potassium: 3.9 mEq/L (ref 3.5–5.3)
Sodium: 136 mEq/L (ref 135–145)
Total Bilirubin: 0.5 mg/dL (ref 0.2–1.2)
Total Protein: 7 g/dL (ref 6.0–8.3)

## 2014-09-22 LAB — MICROALBUMIN / CREATININE URINE RATIO
Creatinine, Urine: 82.3 mg/dL
Microalb Creat Ratio: 3.6 mg/g (ref 0.0–30.0)
Microalb, Ur: 0.3 mg/dL (ref <2.0)

## 2014-09-22 LAB — LIPID PANEL
Cholesterol: 188 mg/dL (ref 0–200)
HDL: 56 mg/dL (ref >=46)
LDL Cholesterol: 108 mg/dL — ABNORMAL HIGH (ref 0–99)
Total CHOL/HDL Ratio: 3.4 Ratio
Triglycerides: 119 mg/dL (ref <150)
VLDL: 24 mg/dL (ref 0–40)

## 2014-09-22 LAB — CBC
HCT: 36.9 % (ref 36.0–46.0)
Hemoglobin: 12.2 g/dL (ref 12.0–15.0)
MCH: 26.2 pg (ref 26.0–34.0)
MCHC: 33.1 g/dL (ref 30.0–36.0)
MCV: 79.2 fL (ref 78.0–100.0)
MPV: 8.7 fL (ref 8.6–12.4)
Platelets: 395 10*3/uL (ref 150–400)
RBC: 4.66 MIL/uL (ref 3.87–5.11)
RDW: 15.4 % (ref 11.5–15.5)
WBC: 8.9 10*3/uL (ref 4.0–10.5)

## 2014-09-22 LAB — POCT URINALYSIS DIPSTICK
Bilirubin, UA: NEGATIVE
Blood, UA: NEGATIVE
Glucose, UA: NEGATIVE
Ketones, UA: NEGATIVE
Leukocytes, UA: NEGATIVE
Nitrite, UA: NEGATIVE
Protein, UA: NEGATIVE
Spec Grav, UA: 1.02
Urobilinogen, UA: 0.2
pH, UA: 7

## 2014-09-22 LAB — GLUCOSE, POCT (MANUAL RESULT ENTRY): POC Glucose: 122 mg/dl — AB (ref 70–99)

## 2014-09-22 LAB — CK: Total CK: 302 U/L — ABNORMAL HIGH (ref 7–177)

## 2014-09-22 LAB — POCT GLYCOSYLATED HEMOGLOBIN (HGB A1C): Hemoglobin A1C: 6.4

## 2014-09-22 MED ORDER — OLMESARTAN MEDOXOMIL 20 MG PO TABS: 20.0000 mg | ORAL_TABLET | Freq: Every day | ORAL | Status: DC

## 2014-09-22 MED ORDER — TRIAMTERENE-HCTZ 37.5-25 MG PO TABS: 1.0000 | ORAL_TABLET | Freq: Every day | ORAL | Status: DC

## 2014-09-22 MED ORDER — OMEPRAZOLE 40 MG PO CPDR: 40.0000 mg | DELAYED_RELEASE_CAPSULE | Freq: Every day | ORAL | Status: DC

## 2014-09-22 NOTE — Patient Instructions (Addendum)
Make sure you are taking a daily calcium/vitamin D supplement - and twice a day would be great. Try to find a chewable calcium CITRATE supplement with 400 to 600 mg of calcium in it and as much vitamin D as you can.  Take this at least once a day, and not with other calcium sources for maximum absorption. Look for an off-brand that is like "Citracal" or "Caltrate" which you will be able to absorb better that the calcium CARBONATE products while you are on medicines for acid reflux. You can try Urea for your foot callus.   Your muscle cramps are coming from the maxide BP med - increase water and potassium in your diet and could try a magneium supp at night though will make your diet worse.  Try coenzyme q10 along with your statin to decrease side effects.  Take asprin 74m EC daily.  Potassium Content of Foods Potassium is a mineral found in many foods and drinks. It helps keep fluids and minerals balanced in your body and affects how steadily your heart beats. Potassium also helps control your blood pressure and keep your muscles and nervous system healthy. Certain health conditions and medicines may change the balance of potassium in your body. When this happens, you can help balance your level of potassium through the foods that you do or do not eat. Your health care provider or dietitian may recommend an amount of potassium that you should have each day. The following lists of foods provide the amount of potassium (in parentheses) per serving in each item. HIGH IN POTASSIUM  The following foods and beverages have 200 mg or more of potassium per serving:  Apricots, 2 raw or 5 dry (200 mg).  Artichoke, 1 medium (345 mg).  Avocado, raw,  each (245 mg).  Banana, 1 medium (425 mg).  Beans, lima, or baked beans, canned,  cup (280 mg).  Beans, white, canned,  cup (595 mg).  Beef roast, 3 oz (320 mg).  Beef, ground, 3 oz (270 mg).  Beets, raw or cooked,  cup (260 mg).  Bran muffin, 2  oz (300 mg).  Broccoli,  cup (230 mg).  Brussels sprouts,  cup (250 mg).  Cantaloupe,  cup (215 mg).  Cereal, 100% bran,  cup (200-400 mg).  Cheeseburger, single, fast food, 1 each (225-400 mg).  Chicken, 3 oz (220 mg).  Clams, canned, 3 oz (535 mg).  Crab, 3 oz (225 mg).  Dates, 5 each (270 mg).  Dried beans and peas,  cup (300-475 mg).  Figs, dried, 2 each (260 mg).  Fish: halibut, tuna, cod, snapper, 3 oz (480 mg).  Fish: salmon, haddock, swordfish, perch, 3 oz (300 mg).  Fish, tuna, canned 3 oz (200 mg).  [place]fries, fast food, 3 oz (470 mg).  Granola with fruit and nuts,  cup (200 mg).  Grapefruit juice,  cup (200 mg).  Greens, beet,  cup (655 mg).  Honeydew melon,  cup (200 mg).  Kale, raw, 1 cup (300 mg).  Kiwi, 1 medium (240 mg).  Kohlrabi, rutabaga, parsnips,  cup (280 mg).  Lentils,  cup (365 mg).  Mango, 1 each (325 mg).  Milk, chocolate, 1 cup (420 mg).  Milk: nonfat, low-fat, whole, buttermilk, 1 cup (350-380 mg).  Molasses, 1 Tbsp (295 mg).  Mushrooms,  cup (280) mg.  Nectarine, 1 each (275 mg).  Nuts: almonds, peanuts, hazelnuts, BB[place] cashew, mixed, 1 oz (200 mg).  Nuts, pistachios, 1 oz (295 mg).  Orange, 1 each (  240 mg).  Orange juice,  cup (235 mg).  Papaya, medium,  fruit (390 mg).  Peanut butter, chunky, 2 Tbsp (240 mg).  Peanut butter, smooth, 2 Tbsp (210 mg).  Pear, 1 medium (200 mg).  Pomegranate, 1 whole (400 mg).  Pomegranate juice,  cup (215 mg).  Pork, 3 oz (350 mg).  Potato chips, salted, 1 oz (465 mg).  Potato, baked with skin, 1 medium (925 mg).  Potatoes, boiled,  cup (255 mg).  Potatoes, mashed,  cup (330 mg).  Prune juice,  cup (370 mg).  Prunes, 5 each (305 mg).  Pudding, chocolate,  cup (230 mg).  Pumpkin, canned,  cup (250 mg).  Raisins, seedless,  cup (270 mg).  Seeds, sunflower or pumpkin, 1 oz (240 mg).  Soy milk, 1 cup (300 mg).  Spinach,  cup (420  mg).  Spinach, canned,  cup (370 mg).  Sweet potato, baked with skin, 1 medium (450 mg).  Swiss chard,  cup (480 mg).  Tomato or vegetable juice,  cup (275 mg).  Tomato sauce or puree,  cup (400-550 mg).  Tomato, raw, 1 medium (290 mg).  Tomatoes, canned,  cup (200-300 mg).  [place], 3 oz (250 mg).  Wheat germ, 1 oz (250 mg).  Winter squash,  cup (250 mg).  Yogurt, plain or fruited, 6 oz (260-435 mg).  Zucchini,  cup (220 mg). MODERATE IN POTASSIUM The following foods and beverages have 50-200 mg of potassium per serving:  Apple, 1 each (150 mg).  Apple juice,  cup (150 mg).  Applesauce,  cup (90 mg).  Apricot nectar,  cup (140 mg).  Asparagus, small spears,  cup or 6 spears (155 mg).  Bagel, cinnamon raisin, 1 each (130 mg).  Bagel, egg or plain, 4 in., 1 each (70 mg).  Beans, green,  cup (90 mg).  Beans, yellow,  cup (190 mg).  Beer, regular, 12 oz (100 mg).  Beets, canned,  cup (125 mg).  Blackberries,  cup (115 mg).  Blueberries,  cup (60 mg).  Bread, whole wheat, 1 slice (70 mg).  Broccoli, raw,  cup (145 mg).  Cabbage,  cup (150 mg).  Carrots, cooked or raw,  cup (180 mg).  Cauliflower, raw,  cup (150 mg).  Celery, raw,  cup (155 mg).  Cereal, bran flakes, cup (120-150 mg).  Cheese, cottage,  cup (110 mg).  Cherries, 10 each (150 mg).  Chocolate, 1 oz bar (165 mg).  Coffee, brewed 6 oz (90 mg).  Corn,  cup or 1 ear (195 mg).  Cucumbers,  cup (80 mg).  Egg, large, 1 each (60 mg).  Eggplant,  cup (60 mg).  Endive, raw, cup (80 mg).  English muffin, 1 each (65 mg).  Fish, orange roughy, 3 oz (150 mg).  Frankfurter, beef or pork, 1 each (75 mg).  Fruit cocktail,  cup (115 mg).  Grape juice,  cup (170 mg).  Grapefruit,  fruit (175 mg).  Grapes,  cup (155 mg).  Greens: kale, turnip, collard,  cup (110-150 mg).  Ice cream or frozen yogurt, chocolate,  cup (175 mg).  Ice cream or  frozen yogurt, vanilla,  cup (120-150 mg).  Lemons, limes, 1 each (80 mg).  Lettuce, all types, 1 cup (100 mg).  Mixed vegetables,  cup (150 mg).  Mushrooms, raw,  cup (110 mg).  Nuts: walnuts, pecans, or macadamia, 1 oz (125 mg).  Oatmeal,  cup (80 mg).  Okra,  cup (110 mg).  Onions, raw,  cup (120 mg).  Peach, 1 each (185 mg).  Peaches, canned,  cup (120 mg).  Pears, canned,  cup (120 mg).  Peas, green, frozen,  cup (90 mg).  Peppers, green,  cup (130 mg).  Peppers, red,  cup (160 mg).  Pineapple juice,  cup (165 mg).  Pineapple, fresh or canned,  cup (100 mg).  Plums, 1 each (105 mg).  Pudding, vanilla,  cup (150 mg).  Raspberries,  cup (90 mg).  Rhubarb,  cup (115 mg).  Rice, wild,  cup (80 mg).  Shrimp, 3 oz (155 mg).  Spinach, raw, 1 cup (170 mg).  Strawberries,  cup (125 mg).  Summer squash  cup (175-200 mg).  Swiss chard, raw, 1 cup (135 mg).  Tangerines, 1 each (140 mg).  Tea, brewed, 6 oz (65 mg).  Turnips,  cup (140 mg).  Watermelon,  cup (85 mg).  Wine, red, table, 5 oz (180 mg).  Wine, white, table, 5 oz (100 mg). LOW IN POTASSIUM The following foods and beverages have less than 50 mg of potassium per serving.  Bread, white, 1 slice (30 mg).  Carbonated beverages, 12 oz (less than 5 mg).  Cheese, 1 oz (20-30 mg).  Cranberries,  cup (45 mg).  Cranberry juice cocktail,  cup (20 mg).  Fats and oils, 1 Tbsp (less than 5 mg).  Hummus, 1 Tbsp (32 mg).  Nectar: papaya, mango, or pear,  cup (35 mg).  Rice, white or brown,  cup (50 mg).  Spaghetti or macaroni,  cup cooked (30 mg).  Tortilla, flour or corn, 1 each (50 mg).  Waffle, 4 in., 1 each (50 mg).  Water chestnuts,  cup (40 mg). Document Released: 11/20/2004 Document Revised: 04/13/2013 Document Reviewed: 03/05/2013 [facility] Patient Information 2015 [place], [place]. This information is not intended to replace advice given to you by your  health care provider. Make sure you discuss any questions you have with your health care provider.  Health Maintenance Adopting a healthy lifestyle and getting preventive care can go a long way to promote health and wellness. Talk with your health care provider about what schedule of regular examinations is right for you. This is a good chance for you to check in with your provider about disease prevention and staying healthy. In between checkups, there are plenty of things you can do on your own. Experts have done a lot of research about which lifestyle changes and preventive measures are most likely to keep you healthy. Ask your health care provider for more information. WEIGHT AND DIET  Eat a healthy diet  Be sure to include plenty of vegetables, fruits, low-fat dairy products, and lean protein.  Do not eat a lot of foods high in solid fats, added sugars, or salt.  Get regular exercise. This is one of the most important things you can do for your health.  Most adults should exercise for at least 150 minutes each week. The exercise should increase your heart rate and make you sweat (moderate-intensity exercise).  Most adults should also do strengthening exercises at least twice a week. This is in addition to the moderate-intensity exercise.  Maintain a healthy weight  Body mass index (BMI) is a measurement that can be used to identify possible weight problems. It estimates body fat based on height and weight. Your health care provider can help determine your BMI and help you achieve or maintain a healthy weight.  For females 59 years of age and older:   A BMI below 18.5 is considered underweight.  A BMI of 18.5 to 24.9 is normal.  A BMI of 25 to 29.9 is considered overweight.  A BMI of 30 and above is considered obese.  Watch levels of cholesterol and blood lipids  You should start having your blood tested for lipids and cholesterol at 49 years of age, then have this test every 5  years.  You may need to have your cholesterol levels checked more often if:  Your lipid or cholesterol levels are high.  You are older than 49 years of age.  You are at high risk for heart disease.  CANCER SCREENING   Lung Cancer  Lung cancer screening is recommended for adults 13-23 years old who are at high risk for lung cancer because of a history of smoking.  A yearly low-dose CT scan of the lungs is recommended for people who:  Currently smoke.  Have quit within the past 15 years.  Have at least a 30-pack-year history of smoking. A pack year is smoking an average of one pack of cigarettes a day for 1 year.  Yearly screening should continue until it has been 15 years since you quit.  Yearly screening should stop if you develop a health problem that would prevent you from having lung cancer treatment.  Breast Cancer  Practice breast self-awareness. This means understanding how your breasts normally appear and feel.  It also means doing regular breast self-exams. Let your health care provider know about any changes, no matter how small.  If you are in your 20s or 30s, you should have a clinical breast exam (CBE) by a health care provider every 1-3 years as part of a regular health exam.  If you are 29 or older, have a CBE every year. Also consider having a breast X-ray (mammogram) every year.  If you have a family history of breast cancer, talk to your health care provider about genetic screening.  If you are at high risk for breast cancer, talk to your health care provider about having an MRI and a mammogram every year.  Breast cancer gene (BRCA) assessment is recommended for women who have family members with BRCA-related cancers. BRCA-related cancers include:  Breast.  Ovarian.  Tubal.  Peritoneal cancers.  Results of the assessment will determine the need for genetic counseling and BRCA1 and BRCA2 testing. Cervical Cancer Routine pelvic examinations to  screen for cervical cancer are no longer recommended for nonpregnant women who are considered low risk for cancer of the pelvic organs (ovaries, uterus, and vagina) and who do not have symptoms. A pelvic examination may be necessary if you have symptoms including those associated with pelvic infections. Ask your health care provider if a screening pelvic exam is right for you.   The Pap test is the screening test for cervical cancer for women who are considered at risk.  If you had a hysterectomy for a problem that was not cancer or a condition that could lead to cancer, then you no longer need Pap tests.  If you are older than 65 years, and you have had normal Pap tests for the past 10 years, you no longer need to have Pap tests.  If you have had past treatment for cervical cancer or a condition that could lead to cancer, you need Pap tests and screening for cancer for at least 20 years after your treatment.  If you no longer get a Pap test, assess your risk factors if they change (such as having a new sexual partner). This  can affect whether you should start being screened again.  Some women have medical problems that increase their chance of getting cervical cancer. If this is the case for you, your health care provider may recommend more frequent screening and Pap tests.  The human papillomavirus (HPV) test is another test that may be used for cervical cancer screening. The HPV test looks for the virus that can cause cell changes in the cervix. The cells collected during the Pap test can be tested for HPV.  The HPV test can be used to screen women 95 years of age and older. Getting tested for HPV can extend the interval between normal Pap tests from three to five years.  An HPV test also should be used to screen women of any age who have unclear Pap test results.  After 49 years of age, women should have HPV testing as often as Pap tests.  Colorectal Cancer  This type of cancer can be  detected and often prevented.  Routine colorectal cancer screening usually begins at 49 years of age and continues through 49 years of age.  Your health care provider may recommend screening at an earlier age if you have risk factors for colon cancer.  Your health care provider may also recommend using home test kits to check for hidden blood in the stool.  A small camera at the end of a tube can be used to examine your colon directly (sigmoidoscopy or colonoscopy). This is done to check for the earliest forms of colorectal cancer.  Routine screening usually begins at age 33.  Direct examination of the colon should be repeated every 5-10 years through 49 years of age. However, you may need to be screened more often if early forms of precancerous polyps or small growths are found. Skin Cancer  Check your skin from head to toe regularly.  Tell your health care provider about any new moles or changes in moles, especially if there is a change in a mole's shape or color.  Also tell your health care provider if you have a mole that is larger than the size of a pencil eraser.  Always use sunscreen. Apply sunscreen liberally and repeatedly throughout the day.  Protect yourself by wearing long sleeves, pants, a wide-brimmed hat, and sunglasses whenever you are outside. HEART DISEASE, DIABETES, AND HIGH BLOOD PRESSURE   Have your blood pressure checked at least every 1-2 years. High blood pressure causes heart disease and increases the risk of stroke.  If you are between 52 years and 56 years old, ask your health care provider if you should take aspirin to prevent strokes.  Have regular diabetes screenings. This involves taking a blood sample to check your fasting blood sugar level.  If you are at a normal weight and have a low risk for diabetes, have this test once every three years after 49 years of age.  If you are overweight and have a high risk for diabetes, consider being tested at a  younger age or more often. PREVENTING INFECTION  Hepatitis B  If you have a higher risk for hepatitis B, you should be screened for this virus. You are considered at high risk for hepatitis B if:  You were born in a country where hepatitis B is common. Ask your health care provider which countries are considered high risk.  Your parents were born in a high-risk country, and you have not been immunized against hepatitis B (hepatitis B vaccine).  You have HIV or AIDS.  You use needles to inject street drugs.  You live with someone who has hepatitis B.  You have had sex with someone who has hepatitis B.  You get hemodialysis treatment.  You take certain medicines for conditions, including cancer, organ transplantation, and autoimmune conditions. Hepatitis C  Blood testing is recommended for:  Everyone born from [birth year] through 1965.  Anyone with known risk factors for hepatitis C. Sexually transmitted infections (STIs)  You should be screened for sexually transmitted infections (STIs) including gonorrhea and chlamydia if:  You are sexually active and are younger than 49 years of age.  You are older than 49 years of age and your health care provider tells you that you are at risk for this type of infection.  Your sexual activity has changed since you were last screened and you are at an increased risk for chlamydia or gonorrhea. Ask your health care provider if you are at risk.  If you do not have HIV, but are at risk, it may be recommended that you take a prescription medicine daily to prevent HIV infection. This is called pre-exposure prophylaxis (PrEP). You are considered at risk if:  You are sexually active and do not regularly use condoms or know the HIV status of your partner(s).  You take drugs by injection.  You are sexually active with a partner who has HIV. Talk with your health care provider about whether you are at high risk of being infected with HIV. If you choose  to begin PrEP, you should first be tested for HIV. You should then be tested every 3 months for as long as you are taking PrEP.  PREGNANCY   If you are premenopausal and you may become pregnant, ask your health care provider about preconception counseling.  If you may become pregnant, take 400 to 800 micrograms (mcg) of folic acid every day.  If you want to prevent pregnancy, talk to your health care provider about birth control (contraception). OSTEOPOROSIS AND MENOPAUSE   Osteoporosis is a disease in which the bones lose minerals and strength with aging. This can result in serious bone fractures. Your risk for osteoporosis can be identified using a bone density scan.  If you are 69 years of age or older, or if you are at risk for osteoporosis and fractures, ask your health care provider if you should be screened.  Ask your health care provider whether you should take a calcium or vitamin D supplement to lower your risk for osteoporosis.  Menopause may have certain physical symptoms and risks.  Hormone replacement therapy may reduce some of these symptoms and risks. Talk to your health care provider about whether hormone replacement therapy is right for you.  HOME CARE INSTRUCTIONS   Schedule regular health, dental, and eye exams.  Stay current with your immunizations.   Do not use any tobacco products including cigarettes, chewing tobacco, or electronic cigarettes.  If you are pregnant, do not drink alcohol.  If you are breastfeeding, limit how much and how often you drink alcohol.  Limit alcohol intake to no more than 1 drink per day for nonpregnant women. One drink equals 12 ounces of beer, 5 ounces of wine, or 1 ounces of hard liquor.  Do not use street drugs.  Do not share needles.  Ask your health care provider for help if you need support or information about quitting drugs.  Tell your health care provider if you often feel depressed.  Tell your health care  provider if you  have ever been abused or do not feel safe at home. Document Released: 10/22/2010 Document Revised: 08/23/2013 Document Reviewed: 03/10/2013 [facility] Patient Information 2015 [place], [place]. This information is not intended to replace advice given to you by your health care provider. Make sure you discuss any questions you have with your health care provider.

## 2014-09-22 NOTE — Progress Notes (Addendum)
Subjective:    Patient ID: Maria Sharp, female    DOB: 1965-07-18, 49 y.o.   MRN: [id number] This chart was scribed for Maria Cheadle, MD by Zola Button, Medical Scribe. This patient was seen in Room 10 and the patient's care was started at 9:46 AM.   HPI HPI Comments: Maria Sharp is a 49 y.o. female with a hx of IBS, gastroparesis, DM, HLD, HTN and morbid obesity who presents to the [facility] for an annual exam.  She is patient of Dr. Regis Bill with [facility]. She was seen by her Feb 2015 for her annual physical. Dr. Regis Bill also follows her DM, HTN, HLD and morbid obesity. She also has a hx of medication noncompliance and vitamin D deficiency. She was given her tetanus in 2015. She does have a hx of abnormal pap smear which she states was over 15 years ago. She is on birth control pills. She had her pneumovax 2 years ago. Her last pap smear was November, 2015 and was negative, done by her gynecologist, Dr. Harrington Challenger. She sees opthalmology annually. No recent labs available. Last EKG from 4 months ago reviewed, showing RBBB.  Cancer Screening: Patient states she has had normal pap smears since her abnormal pap smear. She had her last mammogram at the [facility] in November, 2015. She has had a colonoscopy done by Dr. Olevia Perches because she has IBS and gastroparesis. She has diarrhea predominant due to IBS and gastroparesis.  Supplements: Patient denies taking any supplements. 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  Diabetes: Patient is followed for her diabetes. She does not see an endocrinologist. She is aware that metformin will exacerbate diarrhea due to IBS and gastroparesis, but she has no desire to stop it as she is worried about weight gain. She has tried Januvia and reported intolerable side effects as well as Janumet. She has no desire to try Invokana due to side effect list. Patient refuses any injectables.  Hyperlipidemia: She has not  been taking pravastatin because of side effects. She states she cannot take Lipitor due to the side effects (headache, abdominal pain). Patient has not tried coenzyme q10.  Other Concerns: Her 18 year old mother was recently diagnosed with ITP, herpes and hepatitis. Her mother had an outbreak of herpes on her buttocks. She is concerned about contracting illnesses from her mother as she has been giving her mother baths. Patient has had an outbreak of herpes in the past. She has had the hepatitis B vaccine.  Patient is fasting today.  Patient Care Team: Burnis Medin, MD as PCP - General (Internal Medicine) Mosetta Anis, MD (Allergy) Fabio Pierce, MD as Attending Physician (Ophthalmology) Wallene Huh, DPM as Consulting Physician (Podiatry) Lafayette Dragon, MD as Attending Physician (Gastroenterology) Meredith Pel, MD (Orthopedic Surgery) Minus Breeding, MD as Attending Physician (Cardiology) Selinda Orion, MD as Attending Physician (Dermatology) Gus Height, MD as Consulting Physician (Obstetrics and Gynecology)  Past Medical History  Diagnosis Date  . Diabetes mellitus   . High cholesterol   . IBS (irritable bowel syndrome)   . GERD (gastroesophageal reflux disease)   . Hypertension   . Gastritis   . Fatty liver   . History of varicella   . History of asthma   . Allergic   . Gastroparesis     study 11 14  nl abd [place]  . Allergy   . Asthma    Past  Surgical History  Procedure Laterality Date  . Cholecystectomy    . Wisdom tooth extraction     Family History  Problem Relation Age of Onset  . Colon cancer Neg Hx   . Esophageal cancer Neg Hx   . Stomach cancer Neg Hx   . Rectal cancer Neg Hx   . Prostate cancer Father   . Diabetes Father   . Hypertension Father   . Diabetes Mother   . Heart disease Mother   . Arthritis Mother   . Hyperlipidemia Mother   . Hypertension Mother   . Parkinson's disease Mother   . Thyroid disease Mother   . Diabetes Paternal  Grandmother   . Arthritis Paternal Grandmother   . Hyperlipidemia Paternal Grandmother   . Heart disease Paternal Grandmother   . Dementia Paternal Grandmother   . Hypertension Paternal Grandmother   . Hyperlipidemia Maternal Grandmother   . Hypertension Maternal Grandmother   . Hyperlipidemia Maternal Grandfather   . Hypertension Maternal Grandfather   . Hyperlipidemia Paternal Grandfather   . Hypertension Paternal Grandfather   . Diabetes Paternal Grandfather   . Cholelithiasis Paternal Grandfather     History   Social History  . Marital Status: Single    Spouse Name: N/A  . Number of Children: N/A  . Years of Education: N/A   Occupational History  . Not on file.   Social History Main Topics  . Smoking status: Never Smoker   . Smokeless tobacco: Never Used  . Alcohol Use: No  . Drug Use: No  . Sexual Activity: Not on file   Other Topics Concern  . Not on file   Social History Narrative   Usually # of hours of sleep per night: 6   3 of people living at your residence? 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   Irregular sleep 7:30 to 11 or 12 and then 4 to 6:30 before she goes to work   Has attended college.   Originally from [place]   Abnormal pap remote last 2012      Current Outpatient Prescriptions on File Prior to Visit  Medication Sig Dispense Refill  . albuterol (PROVENTIL HFA;VENTOLIN HFA) 108 (90 BASE) MCG/ACT inhaler Inhale 2 puffs into the lungs every 4 (four) hours as needed. 6.7 g 0  . Azelastine-Fluticasone (DYMISTA) 137-50 MCG/ACT SUSP Place 1 spray into the nose daily. 23 g 0  . cetirizine (ZYRTEC ALLERGY) 10 MG tablet Take 1 tablet (10 mg total) by mouth daily. 30 tablet 1  . EPINEPHrine (EPI-PEN) 0.3 mg/0.3 mL DEVI Inject 0.3 mg into the muscle once as needed. For severe allergic reaction    . montelukast (SINGULAIR) 10 MG  tablet      No current facility-administered medications on file prior to visit.   Allergies  Allergen Reactions  . Ace Inhibitors Anaphylaxis  . Shellfish Allergy Anaphylaxis and Other (See Comments)    Only shrimp  . Januvia [Sitagliptin] Other (See Comments)    Recurrent abdominal pain with elevated pancreas enzymes  . Lipitor [Atorvastatin]     Review of Systems  Gastrointestinal: Positive for diarrhea.  All other systems reviewed and are negative.      Objective:  BP 114/62 mmHg  Pulse 84  Temp(Src) 98.6 F (37 C) (Oral)  Resp 18  Ht 5\' 10"  (1.778 m)  Wt 267 lb (121.11 kg)  BMI 38.31 kg/m2  SpO2 98%  LMP 09/12/2014  Physical Exam  Constitutional: She is oriented to person, place, and time. She appears well-developed and well-nourished. No distress.  HENT:  Head: Normocephalic and atraumatic.  Right Ear: Tympanic membrane and external ear normal.  Left Ear: Tympanic membrane and external ear normal.  Nose: Nose normal.  Mouth/Throat: Oropharynx is clear and moist. No oropharyngeal exudate, posterior oropharyngeal edema or posterior oropharyngeal erythema.  Eyes: Pupils are equal, round, and reactive to light.  Neck: Neck supple. No thyromegaly present.  Thyroid normal.  Cardiovascular: Normal rate, regular rhythm and normal heart sounds.   No murmur heard. 2+ pedal pulses. Moderate varicose veins bilaterally.  Pulmonary/Chest: Effort normal and breath sounds normal. No respiratory distress. She has no wheezes. She has no rales.  CTAB. Excellent air movement.  Musculoskeletal: She exhibits no edema.  Lymphadenopathy:    She has no cervical adenopathy.       Right: No supraclavicular adenopathy present.       Left: No supraclavicular adenopathy present.  Neurological: She is alert and oriented to person, place, and time. No cranial nerve deficit.  Skin: Skin is warm and dry. No rash noted.  Psychiatric: She has a normal mood and affect. Her behavior is normal.    Vitals reviewed.  Results for orders placed or performed in visit on 09/22/14  POCT glycosylated hemoglobin (Hb A1C)  Result Value Ref Range   Hemoglobin A1C 6.4   POCT urinalysis dipstick  Result Value Ref Range   Color, UA Light Yellow    Clarity, UA Clear    Glucose, UA Negative    Bilirubin, UA Negative    Ketones, UA Negative    Spec Grav, UA 1.020    Blood, UA negative    pH, UA 7.0    Protein, UA Negative    Urobilinogen, UA 0.2    Nitrite, UA negative    Leukocytes, UA Negative   POCT glucose (manual entry)  Result Value Ref Range   POC Glucose 122 (A) 70 - 99 mg/dl          Assessment & Plan:    1. Routine health maintenance - repeat pap and mammogram w/ CPE next yr - pt prefers to continue with traditional pap frequency as long as insurance covers  2. Screening for cervical cancer   3. Varicose veins   4. Essential hypertension   5. Gastroparesis   6. Irritable bowel syndrome   7. 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 recheck in 6 mos.  8. Hyperlipidemia LDL goal <100 - slightly above goal but ck mildly elevated on pravastatin 30 so rec trial of tlc/omega-3s, and try coenzyme q10 - recheck in 6 mos  9. Cramp of both lower extremities - likely due to maxzide vs statin - check ck, push fluids, increase K in diet, try qhs magnesium  10. Vitamin D deficiency - start high dose replacement then switch to otc 2000u/d when complete  11. Obesity (BMI 30-39.9)   12. Herpetic dermatitis   13. Exposure to hepatitis - recently found her mother has it - pt unaware of etiology inc if it is viral or not but as pt has been taking care of mother pt wts completely testing.  14. Polypharmacy   15.    Asthma and Chronic rhinosinusitis - . Followed by allergist Dr. Donneta Romberg, [place] to call in for refill  of Singulair, Zyrtec, Dymista and albuterol inhaler if needed.  Orders Placed This Encounter  Procedures  . RPR   . TSH  . Vit D  25 hydroxy (rtn osteoporosis monitoring)  . HIV antibody  . Acute Hep Panel & Hep B Surface Ab  . HSV(herpes simplex vrs) 1+2 ab-IgG  . HSV(herpes simplex vrs) 1+2 ab-IgM  . CBC  . Comprehensive metabolic panel    Order Specific Question:  Has the patient fasted?    Answer:  Yes  . Lipid panel    Order Specific Question:  Has the patient fasted?    Answer:  Yes  . Microalbumin/Creatinine Ratio, Urine  . CK  . POCT glycosylated hemoglobin (Hb A1C)  . POCT urinalysis dipstick  . POCT glucose (manual entry)    Meds ordered this encounter  Medications  . olmesartan (BENICAR) 20 MG tablet    Sig: Take 1 tablet (20 mg total) by mouth daily.    Dispense:  30 tablet    Refill:  5  . omeprazole (PRILOSEC) 40 MG capsule    Sig: Take 1 capsule (40 mg total) by mouth daily.    Dispense:  30 capsule    Refill:  5    Number 14 only until seen in OV  . triamterene-hydrochlorothiazide (MAXZIDE-25) 37.5-25 MG per tablet    Sig: Take 1 tablet by mouth daily.    Dispense:  30 tablet    Refill:  5    I personally performed the services described in this documentation, which was scribed in my presence. The recorded information has been reviewed and considered, and addended by me as needed.  Maria Cheadle, MD MPH   Results for orders placed or performed in visit on 09/22/14  RPR  Result Value Ref Range   RPR Ser Ql NON REAC NON REAC  TSH  Result Value Ref Range   TSH 1.310 0.350 - 4.500 uIU/mL  Vit D  25 hydroxy (rtn osteoporosis monitoring)  Result Value Ref Range   Vit D, 25-Hydroxy 18 (L) 30 - 100 ng/mL  HIV antibody  Result Value Ref Range   HIV 1&2 Ab, 4th Generation NONREACTIVE NONREACTIVE  Acute Hep Panel & Hep B Surface Ab  Result Value Ref Range   Hepatitis B Surface Ag NEGATIVE NEGATIVE   Hep B C IgM NON REACTIVE NON REACTIVE   Hep B S Ab NEG NEGATIVE   Hep A IgM NON REACTIVE NON REACTIVE   HCV Ab NEGATIVE NEGATIVE  HSV(herpes simplex vrs) 1+2 ab-IgG   Result Value Ref Range   HSV 1 Glycoprotein G Ab, IgG 6.69 (H) IV   HSV 2 Glycoprotein G Ab, IgG <0.10 IV  HSV(herpes simplex vrs) 1+2 ab-IgM  Result Value Ref Range   Herpes Simplex Vrs I&II-IgM Ab (EIA) 1.33 (H) INDEX  CBC  Result Value Ref Range   WBC 8.9 4.0 - 10.5 K/uL   RBC 4.66 3.87 - 5.11 MIL/uL   Hemoglobin 12.2 12.0 - 15.0 g/dL   HCT 36.9 36.0 - 46.0 %   MCV 79.2 78.0 - 100.0 fL   MCH 26.2 26.0 - 34.0 pg   MCHC 33.1 30.0 - 36.0 g/dL   RDW 15.4 11.5 - 15.5 %   Platelets 395 150 - 400 K/uL   MPV 8.7 8.6 - 12.4 fL  Comprehensive metabolic panel  Result Value Ref Range   Sodium 136 135 - 145 mEq/L   Potassium 3.9 3.5 - 5.3 mEq/L   Chloride 99 96 - 112  mEq/L   CO2 28 19 - 32 mEq/L   Glucose, Bld 117 (H) 70 - 99 mg/dL   BUN 12 6 - 23 mg/dL   Creat 0.60 0.50 - 1.10 mg/dL   Total Bilirubin 0.5 0.2 - 1.2 mg/dL   Alkaline Phosphatase 44 39 - 117 U/L   AST 11 0 - 37 U/L   ALT 12 0 - 35 U/L   Total Protein 7.0 6.0 - 8.3 g/dL   Albumin 4.2 3.5 - 5.2 g/dL   Calcium 9.5 8.4 - 10.5 mg/dL  Lipid panel  Result Value Ref Range   Cholesterol 188 0 - 200 mg/dL   Triglycerides 119 <150 mg/dL   HDL 56 >=46 mg/dL   Total CHOL/HDL Ratio 3.4 Ratio   VLDL 24 0 - 40 mg/dL   LDL Cholesterol 108 (H) 0 - 99 mg/dL  Microalbumin/Creatinine Ratio, Urine  Result Value Ref Range   Microalb, Ur 0.3 <2.0 mg/dL   Creatinine, Urine 82.3 mg/dL   Microalb Creat Ratio 3.6 0.0 - 30.0 mg/g  CK  Result Value Ref Range   Total CK 302 (H) 7 - 177 U/L  POCT glycosylated hemoglobin (Hb A1C)  Result Value Ref Range   Hemoglobin A1C 6.4   POCT urinalysis dipstick  Result Value Ref Range   Color, UA Light Yellow    Clarity, UA Clear    Glucose, UA Negative    Bilirubin, UA Negative    Ketones, UA Negative    Spec Grav, UA 1.020    Blood, UA negative    pH, UA 7.0    Protein, UA Negative    Urobilinogen, UA 0.2    Nitrite, UA negative    Leukocytes, UA Negative   POCT glucose (manual entry)   Result Value Ref Range   POC Glucose 122 (A) 70 - 99 mg/dl

## 2014-09-23 LAB — ACUTE HEP PANEL AND HEP B SURFACE AB
HCV Ab: NEGATIVE
Hep A IgM: NONREACTIVE
Hep B C IgM: NONREACTIVE
Hep B S Ab: NEGATIVE
Hepatitis B Surface Ag: NEGATIVE

## 2014-09-23 LAB — HIV ANTIBODY (ROUTINE TESTING W REFLEX): HIV 1&2 Ab, 4th Generation: NONREACTIVE

## 2014-09-23 LAB — RPR

## 2014-09-23 LAB — HSV(HERPES SIMPLEX VRS) I + II AB-IGG
HSV 1 Glycoprotein G Ab, IgG: 6.69 IV — ABNORMAL HIGH
HSV 2 Glycoprotein G Ab, IgG: <0.1 IV

## 2014-09-23 LAB — VITAMIN D 25 HYDROXY (VIT D DEFICIENCY, FRACTURES): Vit D, 25-Hydroxy: 18 ng/mL — ABNORMAL LOW (ref 30–100)

## 2014-09-23 LAB — TSH: TSH: 1.31 u[IU]/mL (ref 0.350–4.500)

## 2014-09-23 LAB — HSV(HERPES SIMPLEX VRS) I + II AB-IGM: Herpes Simplex Vrs I&II-IgM Ab (EIA): 1.33 INDEX — ABNORMAL HIGH

## 2014-09-27 MED ORDER — METFORMIN HCL ER 750 MG PO TB24: 1500.0000 mg | ORAL_TABLET | Freq: Every morning | ORAL | Status: DC

## 2014-09-27 MED ORDER — ERGOCALCIFEROL 1.25 MG (50000 UT) PO CAPS: 50000.0000 [IU] | ORAL_CAPSULE | ORAL | Status: DC

## 2014-09-27 MED ORDER — PRAVASTATIN SODIUM 20 MG PO TABS: 20.0000 mg | ORAL_TABLET | Freq: Every day | ORAL | Status: DC

## 2014-09-28 DIAGNOSIS — J45909 Unspecified asthma, uncomplicated: Secondary | ICD-10-CM | POA: Insufficient documentation

## 2014-11-08 ENCOUNTER — Telehealth: Payer: Self-pay | Admitting: *Deleted

## 2014-11-08 NOTE — Telephone Encounter (Signed)
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 Paulla Dolly until 11/2014, what can she do?  I instructed pt to attentively dry between the toes and wear open shoes while getting ready after the shower, use a lite antifungal spray, like the Lotrimin she's using.  Pt agreed.

## 2014-11-21 ENCOUNTER — Encounter: Payer: Self-pay | Admitting: Podiatry

## 2014-11-21 ENCOUNTER — Ambulatory Visit (INDEPENDENT_AMBULATORY_CARE_PROVIDER_SITE_OTHER): Payer: 59 | Admitting: Podiatry

## 2014-11-21 VITALS — BP 131/84 | HR 78 | Resp 15

## 2014-11-21 DIAGNOSIS — M722 Plantar fascial fibromatosis: Secondary | ICD-10-CM

## 2014-11-21 DIAGNOSIS — L309 Dermatitis, unspecified: Secondary | ICD-10-CM

## 2014-11-22 NOTE — Progress Notes (Signed)
Subjective:     Patient ID: Maria Sharp, female   DOB: Aug 28, 1965, 49 y.o.   MRN: [id number]  HPI patient presents stating I have had some itchiness between my fourth and fifth toes on my left foot and my heels have been bothering me on both feet and I need braces. States that her physician was concerned about the irritation between her toes but there's been no drainage   Review of Systems     Objective:   Physical Exam Neurovascular status found to be intact muscle strength was adequate with patient having a low grade irritation between the fourth and fifth toes left that's localized in nature with no indications of drainage or proximal spread. Patient has moderate discomfort plantar aspect heel bilateral with depression of the arch is noted bilateral    Assessment:     Localized fungal infection fourth interspace left foot and plantar fascial symptomatology bilateral    Plan:     Dispensed fascial brace is bilateral with instructions on usage and instructed on physical therapy and utilization of over-the-counter Lamisil for localized fungal infection

## 2014-11-28 ENCOUNTER — Encounter: Payer: Self-pay | Admitting: *Deleted

## 2014-11-30 ENCOUNTER — Ambulatory Visit: Payer: 59 | Admitting: Internal Medicine

## 2014-12-27 ENCOUNTER — Encounter: Payer: Self-pay | Admitting: Podiatry

## 2014-12-27 ENCOUNTER — Ambulatory Visit (INDEPENDENT_AMBULATORY_CARE_PROVIDER_SITE_OTHER): Payer: 59 | Admitting: Podiatry

## 2014-12-27 DIAGNOSIS — B351 Tinea unguium: Secondary | ICD-10-CM

## 2014-12-27 DIAGNOSIS — M79673 Pain in unspecified foot: Secondary | ICD-10-CM | POA: Diagnosis not present

## 2014-12-27 DIAGNOSIS — M79609 Pain in unspecified limb: Principal | ICD-10-CM

## 2014-12-27 NOTE — Progress Notes (Signed)
Subjective:     Patient ID: Maria Sharp, female   DOB: 11-24-1965, 49 y.o.   MRN: [id number]  HPIThis patient presents with long thick nails on both feet.  She says the nails are painful walking and wearing shoes.  She also has hard area on the outside left heel. She is diabetic with no foot pathology.    Review of Systems     Objective:   Physical Exam GENERAL APPEARANCE: Alert, conversant. Appropriately groomed. No acute distress.  VASCULAR: Pedal pulses palpable at  [facility] and PT bilateral.  Capillary refill time is immediate to all digits,  Normal temperature gradient.  Digital hair growth is present bilateral  NEUROLOGIC: sensation is normal to 5.07 monofilament at 5/5 sites bilateral.  Light touch is intact bilateral, Muscle strength normal.  MUSCULOSKELETAL: acceptable muscle strength, tone and stability bilateral.  Intrinsic muscluature intact bilateral.  Rectus appearance of foot and digits noted bilateral.   DERMATOLOGIC: skin color, texture, and turgor are within normal limits.  No preulcerative lesions or ulcers  are seen, no interdigital maceration noted.  No open lesions present.  . No drainage noted. Asymptomatic porokeratosis left heel.  NAILS thick disfigured hallux nails both feet.      Assessment:     Onychomycosis Hallux B/L     Plan:     Debridement of nails both feet.

## 2015-01-04 ENCOUNTER — Ambulatory Visit (INDEPENDENT_AMBULATORY_CARE_PROVIDER_SITE_OTHER): Payer: 59 | Admitting: Podiatry

## 2015-01-04 ENCOUNTER — Encounter: Payer: Self-pay | Admitting: Podiatry

## 2015-01-04 DIAGNOSIS — L600 Ingrowing nail: Secondary | ICD-10-CM

## 2015-01-04 NOTE — Progress Notes (Signed)
Subjective:     Patient ID: Maria Sharp, female   DOB: [birth year], 49 y.o.   MRN: [id number]  HPI patient presents with a painful nail on the right little toe that she states has become more irritated over the last week. She does have a rotation of the toe with keratotic lesion but is walking on the distal lateral aspect of   Review of Systems     Objective:   Physical Exam Vascular status intact muscle strength adequate range of motion within normal limits with patient having an incurvated right hallux lateral border that's painful when pressed and there is some skin irritation secondary to the position of the toe    Assessment:     Ingrown toenail deformity right fifth toe lateral border with adductovarus Deformity of toes part of the problem    Plan:     Reviewed condition and at this point recommended just working on the nailbed. Explained procedure and risk and at this time I infiltrated the right fifth toe 60 mg I can Marcaine mixture remove the lateral corner exposed matrix and applied phenol 3 applications 20 seconds followed by alcohol lavage and sterile dressing. Gave instructions on soaks and reappoint

## 2015-01-04 NOTE — Patient Instructions (Signed)

## 2015-01-05 ENCOUNTER — Telehealth: Payer: Self-pay | Admitting: *Deleted

## 2015-01-05 NOTE — Telephone Encounter (Signed)
Left message at [phone] (Cell #) to check to see how pt was doing from their ingrown toenail procedure that was performed on Wednesday, January 04, 2015. Waiting for a response.

## 2015-01-24 ENCOUNTER — Ambulatory Visit: Payer: 59 | Admitting: Gastroenterology

## 2015-02-11 ENCOUNTER — Encounter (HOSPITAL_BASED_OUTPATIENT_CLINIC_OR_DEPARTMENT_OTHER): Payer: Self-pay | Admitting: Emergency Medicine

## 2015-02-11 ENCOUNTER — Emergency Department (HOSPITAL_BASED_OUTPATIENT_CLINIC_OR_DEPARTMENT_OTHER)
Admission: EM | Admit: 2015-02-11 | Discharge: 2015-02-11 | Disposition: A | Discharge status: Home / Self Care | Payer: 59 | Attending: Emergency Medicine | Admitting: Emergency Medicine

## 2015-02-11 ENCOUNTER — Emergency Department (HOSPITAL_BASED_OUTPATIENT_CLINIC_OR_DEPARTMENT_OTHER): Payer: 59

## 2015-02-11 DIAGNOSIS — J45909 Unspecified asthma, uncomplicated: Secondary | ICD-10-CM | POA: Insufficient documentation

## 2015-02-11 DIAGNOSIS — Z79899 Other long term (current) drug therapy: Secondary | ICD-10-CM | POA: Insufficient documentation

## 2015-02-11 DIAGNOSIS — Z7951 Long term (current) use of inhaled steroids: Secondary | ICD-10-CM | POA: Insufficient documentation

## 2015-02-11 DIAGNOSIS — Z8619 Personal history of other infectious and parasitic diseases: Secondary | ICD-10-CM | POA: Insufficient documentation

## 2015-02-11 DIAGNOSIS — R109 Unspecified abdominal pain: Secondary | ICD-10-CM | POA: Diagnosis present

## 2015-02-11 DIAGNOSIS — K219 Gastro-esophageal reflux disease without esophagitis: Secondary | ICD-10-CM | POA: Diagnosis not present

## 2015-02-11 DIAGNOSIS — E7800 Pure hypercholesterolemia, unspecified: Secondary | ICD-10-CM | POA: Insufficient documentation

## 2015-02-11 DIAGNOSIS — I10 Essential (primary) hypertension: Secondary | ICD-10-CM | POA: Diagnosis not present

## 2015-02-11 DIAGNOSIS — K3184 Gastroparesis: Secondary | ICD-10-CM

## 2015-02-11 DIAGNOSIS — E1143 Type 2 diabetes mellitus with diabetic autonomic (poly)neuropathy: Secondary | ICD-10-CM | POA: Insufficient documentation

## 2015-02-11 LAB — COMPREHENSIVE METABOLIC PANEL
ALT: 18 U/L (ref 14–54)
AST: 14 U/L — ABNORMAL LOW (ref 15–41)
Albumin: 4 g/dL (ref 3.5–5.0)
Alkaline Phosphatase: 51 U/L (ref 38–126)
Anion gap: 7 (ref 5–15)
BUN: 18 mg/dL (ref 6–20)
CO2: 28 mmol/L (ref 22–32)
Calcium: 9.6 mg/dL (ref 8.9–10.3)
Chloride: 103 mmol/L (ref 101–111)
Creatinine, Ser: 0.63 mg/dL (ref 0.44–1.00)
GFR calc Af Amer: >60 mL/min (ref >60)
GFR calc non Af Amer: >60 mL/min (ref >60)
Glucose, Bld: 96 mg/dL (ref 65–99)
Potassium: 3.5 mmol/L (ref 3.5–5.1)
Sodium: 138 mmol/L (ref 135–145)
Total Bilirubin: 0.3 mg/dL (ref 0.3–1.2)
Total Protein: 7.7 g/dL (ref 6.5–8.1)

## 2015-02-11 LAB — CBC WITH DIFFERENTIAL/PLATELET
Basophils Absolute: 0 10*3/uL (ref 0.0–0.1)
Basophils Relative: 0 %
Eosinophils Absolute: 0.1 10*3/uL (ref 0.0–0.7)
Eosinophils Relative: 1 %
HCT: 37.1 % (ref 36.0–46.0)
Hemoglobin: 12 g/dL (ref 12.0–15.0)
Lymphocytes Relative: 36 %
Lymphs Abs: 3.7 10*3/uL (ref 0.7–4.0)
MCH: 27 pg (ref 26.0–34.0)
MCHC: 32.3 g/dL (ref 30.0–36.0)
MCV: 83.4 fL (ref 78.0–100.0)
Monocytes Absolute: 1 10*3/uL (ref 0.1–1.0)
Monocytes Relative: 10 %
Neutro Abs: 5.4 10*3/uL (ref 1.7–7.7)
Neutrophils Relative %: 53 %
Platelets: 383 10*3/uL (ref 150–400)
RBC: 4.45 MIL/uL (ref 3.87–5.11)
RDW: 15.2 % (ref 11.5–15.5)
WBC: 10.2 10*3/uL (ref 4.0–10.5)

## 2015-02-11 LAB — LIPASE, BLOOD: Lipase: 27 U/L (ref 11–51)

## 2015-02-11 MED ORDER — SODIUM CHLORIDE 0.9 % IV SOLN
INTRAVENOUS | Status: DC
  Administered 2015-02-11: 04:00:00 via INTRAVENOUS

## 2015-02-11 MED ORDER — METOCLOPRAMIDE HCL 10 MG PO TABS: 10.0000 mg | ORAL_TABLET | Freq: Four times a day (QID) | ORAL | Status: DC | PRN

## 2015-02-11 MED ORDER — METOCLOPRAMIDE HCL 5 MG/ML IJ SOLN
10.0000 mg | Freq: Once | INTRAMUSCULAR | Status: AC
  Administered 2015-02-11: 10 mg via INTRAVENOUS
  Filled 2015-02-11: qty 2

## 2015-02-11 NOTE — ED Notes (Signed)
C/o abd pain with nausea x 2 days. One episode of loose stool tonight. Reports taking miralax at home. Reports hx of IBS and gastroparesis. Denies any other symptoms.

## 2015-02-11 NOTE — ED Provider Notes (Signed)
CSN: [id number]     Arrival date & time 02/11/15  5277 History   First MD Initiated Contact with Patient 02/11/15 0405     Chief Complaint  Patient presents with  . Abdominal Pain     (Consider location/radiation/quality/duration/timing/severity/associated sxs/prior Treatment) HPI  This is a 49 year old female with a history of diabetes, gastroparesis and irritable bowel syndrome. She is here with a two-day history of constipation. She has been self-medicating with MiraLAX. She did have a small bowel movement this morning that was described as loose. She is here with abdominal pain and distention. The pain is diffuse and feels like pressure that needs to be relieved. She has been nauseated but not vomiting. She has had decreased oral intake.  Past Medical History  Diagnosis Date  . Diabetes mellitus   . High cholesterol   . IBS (irritable bowel syndrome)   . GERD (gastroesophageal reflux disease)   . Hypertension   . Gastritis   . Fatty liver   . History of varicella   . History of asthma   . Allergic   . Gastroparesis     study 11 14  nl abd [place]  . Allergy   . Asthma    Past Surgical History  Procedure Laterality Date  . Cholecystectomy    . Wisdom tooth extraction     Family History  Problem Relation Age of Onset  . Colon cancer Neg Hx   . Esophageal cancer Neg Hx   . Stomach cancer Neg Hx   . Rectal cancer Neg Hx   . Prostate cancer Father   . Diabetes Father   . Hypertension Father   . Diabetes Mother   . Heart disease Mother   . Arthritis Mother   . Hyperlipidemia Mother   . Hypertension Mother   . Parkinson's disease Mother   . Thyroid disease Mother   . Diabetes Paternal Grandmother   . Arthritis Paternal Grandmother   . Hyperlipidemia Paternal Grandmother   . Heart disease Paternal Grandmother   . Dementia Paternal Grandmother   . Hypertension Paternal Grandmother   . Hyperlipidemia Maternal Grandmother   . Hypertension Maternal Grandmother   .  Hyperlipidemia Maternal Grandfather   . Hypertension Maternal Grandfather   . Hyperlipidemia Paternal Grandfather   . Hypertension Paternal Grandfather   . Diabetes Paternal Grandfather   . Cholelithiasis Paternal Grandfather    Social History  Substance Use Topics  . Smoking status: Never Smoker   . Smokeless tobacco: Never Used  . Alcohol Use: No   OB History    Gravida Para Term Preterm AB TAB SAB Ectopic Multiple Living   0              Review of Systems  All other systems reviewed and are negative.   Allergies  Ace inhibitors; Shellfish allergy; Januvia; and Lipitor  Home Medications   Prior to Admission medications   Medication Sig Start Date End Date Taking? Authorizing Provider  cetirizine (ZYRTEC ALLERGY) 10 MG tablet Take 1 tablet (10 mg total) by mouth daily. 03/24/14  Yes Noemi Chapel, MD  EPINEPHrine (EPI-PEN) 0.3 mg/0.3 mL DEVI Inject 0.3 mg into the muscle once as needed. For severe allergic reaction   Yes Historical Provider, MD  albuterol (PROVENTIL HFA;VENTOLIN HFA) 108 (90 BASE) MCG/ACT inhaler Inhale 2 puffs into the lungs every 4 (four) hours as needed. 05/17/14   Rolland Porter, MD  Azelastine-Fluticasone (DYMISTA) 137-50 MCG/ACT SUSP Place 1 spray into the nose daily. 06/25/13  Laurey Morale, MD  ergocalciferol (VITAMIN D2) 50000 UNITS capsule Take 1 capsule (50,000 Units total) by mouth once a week. 09/27/14   Shawnee Knapp, MD  metFORMIN (GLUCOPHAGE-XR) 750 MG 24 hr tablet Take 2 tablets (1,500 mg total) by mouth every morning. 09/27/14   Shawnee Knapp, MD  montelukast (SINGULAIR) 10 MG tablet  09/16/12   Historical Provider, MD  olmesartan (BENICAR) 20 MG tablet Take 1 tablet (20 mg total) by mouth daily. 09/22/14   Shawnee Knapp, MD  omeprazole (PRILOSEC) 40 MG capsule Take 1 capsule (40 mg total) by mouth daily. 09/22/14   Shawnee Knapp, MD  pravastatin (PRAVACHOL) 20 MG tablet Take 1 tablet (20 mg total) by mouth daily. 09/27/14   Shawnee Knapp, MD  triamterene-hydrochlorothiazide  (MAXZIDE-25) 37.5-25 MG per tablet Take 1 tablet by mouth daily. 09/22/14   Shawnee Knapp, MD   BP 145/77 mmHg  Pulse 84  Temp(Src) 98.5 F (36.9 C) (Oral)  Resp 20  Ht 5\' 10"  (1.778 m)  Wt 267 lb (121.11 kg)  BMI 38.31 kg/m2  SpO2 98%  LMP 01/30/2015   Physical Exam  General: Well-developed, well-nourished female in no acute distress; appearance consistent with age of record HENT: normocephalic; atraumatic Eyes: pupils equal, round and reactive to light; extraocular muscles intact Neck: supple Heart: regular rate and rhythm Lungs: clear to auscultation bilaterally Abdomen: soft; mildly distended; diffusely tender; no masses or hepatosplenomegaly; bowel sounds present Extremities: No deformity; full range of motion; pulses normal Neurologic: Awake, alert and oriented; motor function intact in all extremities and symmetric; no facial droop Skin: Warm and dry Psychiatric: Normal mood and affect    ED Course  Procedures (including critical care time)   MDM  Nursing notes and vitals signs, including pulse oximetry, reviewed.  Summary of this visit's results, reviewed by myself:  Labs:  Results for orders placed or performed during the hospital encounter of 02/11/15 (from the past 24 hour(s))  CBC with Differential/Platelet     Status: None   Collection Time: 02/11/15  4:25 AM  Result Value Ref Range   WBC 10.2 4.0 - 10.5 K/uL   RBC 4.45 3.87 - 5.11 MIL/uL   Hemoglobin 12.0 12.0 - 15.0 g/dL   HCT 37.1 36.0 - 46.0 %   MCV 83.4 78.0 - 100.0 fL   MCH 27.0 26.0 - 34.0 pg   MCHC 32.3 30.0 - 36.0 g/dL   RDW 15.2 11.5 - 15.5 %   Platelets 383 150 - 400 K/uL   Neutrophils Relative % 53 %   Neutro Abs 5.4 1.7 - 7.7 K/uL   Lymphocytes Relative 36 %   Lymphs Abs 3.7 0.7 - 4.0 K/uL   Monocytes Relative 10 %   Monocytes Absolute 1.0 0.1 - 1.0 K/uL   Eosinophils Relative 1 %   Eosinophils Absolute 0.1 0.0 - 0.7 K/uL   Basophils Relative 0 %   Basophils Absolute 0.0 0.0 - 0.1 K/uL   Comprehensive metabolic panel     Status: Abnormal   Collection Time: 02/11/15  4:25 AM  Result Value Ref Range   Sodium 138 135 - 145 mmol/L   Potassium 3.5 3.5 - 5.1 mmol/L   Chloride 103 101 - 111 mmol/L   CO2 28 22 - 32 mmol/L   Glucose, Bld 96 65 - 99 mg/dL   BUN 18 6 - 20 mg/dL   Creatinine, Ser 0.63 0.44 - 1.00 mg/dL   Calcium 9.6 8.9 - 10.3 mg/dL  Total Protein 7.7 6.5 - 8.1 g/dL   Albumin 4.0 3.5 - 5.0 g/dL   AST 14 (L) 15 - 41 U/L   ALT 18 14 - 54 U/L   Alkaline Phosphatase 51 38 - 126 U/L   Total Bilirubin 0.3 0.3 - 1.2 mg/dL   GFR calc non Af Amer >60 >60 mL/min   GFR calc Af Amer >60 >60 mL/min   Anion gap 7 5 - 15  Lipase, blood     Status: None   Collection Time: 02/11/15  4:25 AM  Result Value Ref Range   Lipase 27 11 - 51 U/L    Imaging Studies: Dg Abd 1 View  02/11/2015  CLINICAL DATA:  Acute onset of generalized abdominal pain and nausea. Initial encounter. EXAM: ABDOMEN - 1 VIEW COMPARISON:  Abdominal ultrasound performed 06/09/2012 FINDINGS: The visualized bowel gas pattern is unremarkable. Scattered air and stool filled loops of colon are seen; no abnormal dilatation of small bowel loops is seen to suggest small bowel obstruction. No free intra-abdominal air is identified, though evaluation for free air is limited on a single supine view. Clips are noted within the right upper quadrant, reflecting prior cholecystectomy. The visualized osseous structures are within normal limits; the sacroiliac joints are unremarkable in appearance. IMPRESSION: Unremarkable bowel gas pattern; no free intra-abdominal air seen. Small amount of stool noted in the colon. Electronically Signed   By: Garald Balding M.D.   On: 02/11/2015 04:37   5:15 AM Patient feeling much better after IV Reglan. Advised of reassuring labs and x-ray studies. She has an appointment with her gastroenterologist the day after tomorrow.   Shanon Rosser, MD 02/11/15 [phone]

## 2015-02-13 ENCOUNTER — Encounter: Payer: Self-pay | Admitting: Gastroenterology

## 2015-02-13 ENCOUNTER — Ambulatory Visit (INDEPENDENT_AMBULATORY_CARE_PROVIDER_SITE_OTHER): Payer: 59 | Admitting: Gastroenterology

## 2015-02-13 ENCOUNTER — Other Ambulatory Visit: Payer: Self-pay | Admitting: *Deleted

## 2015-02-13 VITALS — BP 132/90 | HR 84 | Ht 70.25 in | Wt 273.2 lb

## 2015-02-13 DIAGNOSIS — R112 Nausea with vomiting, unspecified: Secondary | ICD-10-CM

## 2015-02-13 DIAGNOSIS — R159 Full incontinence of feces: Secondary | ICD-10-CM

## 2015-02-13 DIAGNOSIS — K5902 Outlet dysfunction constipation: Secondary | ICD-10-CM | POA: Diagnosis not present

## 2015-02-13 MED ORDER — POLYETHYLENE GLYCOL 3350 17 GM/SCOOP PO POWD: 8.5000 g | ORAL | Status: DC | PRN

## 2015-02-13 NOTE — Patient Instructions (Signed)
Your rectal manometry study is scheduled at [facility] Endoscopy department on 03/06/2015 at 8:30am, arrive 30 minutes before your appointment You will be nothing to eat or drink 6 hours before Use a fleets enema 1 hour prior to your test  Follow up in 3 months  We have given you a gastroparesis diet

## 2015-02-13 NOTE — Progress Notes (Signed)
Maria Sharp    [id number]    1965/05/24  Primary Care Physician:SHAW,EVA, MD  Referring Physician: Shawnee Knapp, MD [address] [place], [place] [address]  Chief complaint:  Nausea, Constipation, fecal incontinence  HPI:  49 year old female with history of diabetes here for follow-up visit. Patient complains of having an episode of constipation and passing bright red blood per rectum x 1 episode last week for which she went to the ER. Her vitals were stable and labs were within normal limits and she was discharged home. She took 2 packets of MiraLAX prior to going to the ER and subsequently had bowel movements. No further episodes of blood per rectum. Last colonoscopy in Nov 2009, small internal hemorrhoids, otherwise normal. Patient reports having difficulty completely evacuating and always has fragmentation, she has to go multiple times during the day to completely empty her rectum. She also reported having fecal incontinence worse when she has loose stool. She has nausea almost every day, no vomiting. Her weight is stable, she is trying to exercise routinely. Patient is currently on FMLA and requesting to renew her paperwork. She did not start Reglan or domperidone due to the fear of side effects.  Outpatient Encounter Prescriptions as of 02/13/2015  Medication Sig  . BENICAR 20 MG tablet Take 1 tablet by mouth daily.  . cetirizine (ZYRTEC ALLERGY) 10 MG tablet Take 1 tablet (10 mg total) by mouth daily.  Marland Kitchen EPINEPHrine (EPI-PEN) 0.3 mg/0.3 mL DEVI Inject 0.3 mg into the muscle once as needed. For severe allergic reaction  . metFORMIN (GLUCOPHAGE-XR) 750 MG 24 hr tablet Take 1 tablet by mouth 2 (two) times daily.  . metoCLOPramide (REGLAN) 10 MG tablet Take 10 mg by mouth as needed for nausea.  Marland Kitchen omeprazole (PRILOSEC) 40 MG capsule Take 40 mg by mouth daily.  . polyethylene glycol powder (GLYCOLAX/MIRALAX) powder Take 8.5 g by mouth as needed.  .  triamterene-hydrochlorothiazide (MAXZIDE-25) 37.5-25 MG tablet Take 1 tablet by mouth daily.  . Vitamin D, Ergocalciferol, (DRISDOL) 50000 UNITS CAPS capsule Take 1 capsule by mouth every 7 (seven) days.  . [DISCONTINUED] polyethylene glycol powder (GLYCOLAX/MIRALAX) powder Take 17 g by mouth as needed.  . [DISCONTINUED] metoCLOPramide (REGLAN) 10 MG tablet Take 1 tablet (10 mg total) by mouth every 6 (six) hours as needed for nausea or vomiting.   No facility-administered encounter medications on file as of 02/13/2015.    Allergies as of 02/13/2015 - Review Complete 02/13/2015  Allergen Reaction Noted  . Ace inhibitors Anaphylaxis 03/03/2008  . Shellfish allergy Anaphylaxis and Other (See Comments) 05/25/2011  . Januvia [sitagliptin] Other (See Comments) 10/05/2012  . Lipitor [atorvastatin]  02/10/2013    Past Medical History  Diagnosis Date  . Diabetes mellitus   . High cholesterol   . IBS (irritable bowel syndrome)   . GERD (gastroesophageal reflux disease)   . Hypertension   . Gastritis   . Fatty liver   . History of varicella   . History of asthma   . Allergic   . Gastroparesis     study 11 14  nl abd [place]  . Allergy   . Asthma     Past Surgical History  Procedure Laterality Date  . Cholecystectomy    . Wisdom tooth extraction      Family History  Problem Relation Age of Onset  . Colon cancer Neg Hx   . Esophageal cancer Neg Hx   . Stomach cancer  Neg Hx   . Rectal cancer Neg Hx   . Prostate cancer Father   . Diabetes Father   . Hypertension Father   . Diabetes Mother   . Heart disease Mother   . Arthritis Mother   . Hyperlipidemia Mother   . Hypertension Mother   . Parkinson's disease Mother   . Thyroid disease Mother   . Diabetes Paternal Grandmother   . Arthritis Paternal Grandmother   . Hyperlipidemia Paternal Grandmother   . Heart disease Paternal Grandmother   . Dementia Paternal Grandmother   . Hypertension Paternal Grandmother   . Hyperlipidemia  Maternal Grandmother   . Hypertension Maternal Grandmother   . Hyperlipidemia Maternal Grandfather   . Hypertension Maternal Grandfather   . Hyperlipidemia Paternal Grandfather   . Hypertension Paternal Grandfather   . Diabetes Paternal Grandfather   . Cholelithiasis Paternal Grandfather     Social History   Social History  . Marital Status: Single    Spouse Name: N/A  . Number of Children: N/A  . Years of Education: N/A   Occupational History  . Not on file.   Social History Main Topics  . Smoking status: Never Smoker   . Smokeless tobacco: Never Used  . Alcohol Use: No  . Drug Use: No  . Sexual Activity: Not on file   Other Topics Concern  . Not on file   Social History Narrative   Usually # of hours of sleep per night: 6   3 of people living at your residence? 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   Irregular sleep 7:30 to 11 or 12 and then 4 to 6:30 before she goes to work   Has attended college.   Originally from [place]   Abnormal pap remote last 2012       Review of systems: Review of Systems  Constitutional: Negative for fever and chills.  HENT: Negative.   Eyes: Negative for blurred vision.  Respiratory: Negative for cough, shortness of breath and wheezing.   Cardiovascular: Negative for chest pain and palpitations.  Gastrointestinal: as per HPI Genitourinary: Negative for dysuria, urgency, frequency and hematuria.  Musculoskeletal: Negative for myalgias, back pain and joint pain.  Skin: Negative for itching and rash.  Neurological: Negative for dizziness, tremors, focal weakness, seizures and loss of consciousness.  Endo/Heme/Allergies: Negative for environmental allergies.  Psychiatric/Behavioral: Negative for depression, suicidal ideas and hallucinations.  All other systems reviewed and are negative.   Physical  Exam: Filed Vitals:   02/13/15 1030  BP: 132/90  Pulse: 84   Gen:      No acute distress HEENT:  EOMI, sclera anicteric Neck:     No masses; no thyromegaly Lungs:    Clear to auscultation bilaterally; normal respiratory effort CV:         Regular rate and rhythm; no murmurs Abd:      + bowel sounds; soft, non-tender; no palpable masses, no distension Ext:    No edema; adequate peripheral perfusion Skin:      Warm and dry; no rash Neuro: alert and oriented x 3 Psych: normal mood and affect Rectal exam: No external hemorrhoids or fissures. Weak sphincter tone. Paradoxical contraction of anal sphincter during attempted bear down.   Data Reviewed:  2 hour Gastric emptying scan in November 2014 showing 98% retention in 60 minutes  and 85% retention in 120 minutes.  A CT scan of the abdomen in 2010 showed mild hepatomegaly.  An upper endoscopy in February 2014 showed H. pylori negative gastritis.  An upper abdominal ultrasound in February 2014 showed her to be postcholecystectomy .  Her colonoscopy in Nov 2009 was a normal exam.   Reviewed her recent labs and chart  Assessment and Plan/Recommendations:  49 year old female with history of diabetes, currently well managed (A1c 6.4 in June 2016), here with complaints of nausea, constipation and fecal incontinence. She was diagnosed with gastroparesis based on 2 hour gastric emptying study but patient's symptoms are not very typical of gastroparesis. Other than nausea she has no complaints of vomiting or postprandial symptoms. Advice patient to follow gastroparesis diet, to limit fiber and low-fat diet. Small frequent meals. Constipation likely secondary to outlet dysfunction + intermittent fecal incontinence: We'll schedule for anorectal manometry to evaluate and will likely need pelvic floor physical therapy.  Adviced patient to start taking half a capful of MiraLAX every night on a regular basis Return in 3 months  K. Denzil Magnuson ,  MD [phone] Mon-Fri 8a-5p [phone] after 5p, weekends, holidays

## 2015-03-03 ENCOUNTER — Telehealth: Payer: Self-pay | Admitting: Gastroenterology

## 2015-03-03 NOTE — Telephone Encounter (Signed)
Patient rescheduled to 04/03/15 arrive at 10:00 am. She is aware of the new date and time.

## 2015-03-07 ENCOUNTER — Other Ambulatory Visit: Payer: Self-pay | Admitting: Family Medicine

## 2015-03-26 ENCOUNTER — Other Ambulatory Visit: Payer: Self-pay | Admitting: Family Medicine

## 2015-03-31 ENCOUNTER — Ambulatory Visit (INDEPENDENT_AMBULATORY_CARE_PROVIDER_SITE_OTHER): Payer: 59 | Admitting: Podiatry

## 2015-03-31 ENCOUNTER — Encounter: Payer: Self-pay | Admitting: Podiatry

## 2015-03-31 DIAGNOSIS — B351 Tinea unguium: Secondary | ICD-10-CM | POA: Diagnosis not present

## 2015-03-31 DIAGNOSIS — M79609 Pain in unspecified limb: Principal | ICD-10-CM

## 2015-03-31 DIAGNOSIS — M79673 Pain in unspecified foot: Secondary | ICD-10-CM | POA: Diagnosis not present

## 2015-03-31 NOTE — Progress Notes (Signed)
Subjective:     Patient ID: Maria Sharp, female   DOB: [birth year], 49 y.o.   MRN: [id number]  HPIThis patient presents with long thick nails on both feet.  She says the nails are painful walking and wearing shoes.  . She is diabetic with no foot pathology.    Review of Systems     Objective:   Physical Exam GENERAL APPEARANCE: Alert, conversant. Appropriately groomed. No acute distress.  VASCULAR: Pedal pulses palpable at  [facility] and PT bilateral.  Capillary refill time is immediate to all digits,  Normal temperature gradient.  Digital hair growth is present bilateral  NEUROLOGIC: sensation is normal to 5.07 monofilament at 5/5 sites bilateral.  Light touch is intact bilateral, Muscle strength normal.  MUSCULOSKELETAL: acceptable muscle strength, tone and stability bilateral.  Intrinsic muscluature intact bilateral.  Rectus appearance of foot and digits noted bilateral.   DERMATOLOGIC: skin color, texture, and turgor are within normal limits.  No preulcerative lesions or ulcers  are seen, no interdigital maceration noted.  No open lesions present.  . No drainage noted. Asymptomatic porokeratosis left heel.  NAILS thick disfigured hallux nails both feet.      Assessment:     Onychomycosis Hallux B/L     Plan:     Debridement of nails both feet.   Gardiner Barefoot DPM

## 2015-04-03 ENCOUNTER — Ambulatory Visit (HOSPITAL_COMMUNITY)
Admission: RE | Admit: 2015-04-03 | Discharge: 2015-04-03 | Disposition: A | Discharge status: Home / Self Care | Payer: 59 | Source: Ambulatory Visit | Attending: Gastroenterology | Admitting: Gastroenterology

## 2015-04-03 ENCOUNTER — Encounter (HOSPITAL_COMMUNITY)
Admission: RE | Disposition: A | Discharge status: Home / Self Care | Payer: Self-pay | Source: Ambulatory Visit | Attending: Gastroenterology

## 2015-04-03 DIAGNOSIS — K5900 Constipation, unspecified: Secondary | ICD-10-CM | POA: Insufficient documentation

## 2015-04-03 DIAGNOSIS — R159 Full incontinence of feces: Secondary | ICD-10-CM | POA: Insufficient documentation

## 2015-04-03 HISTORY — PX: ANAL RECTAL MANOMETRY: SHX6358

## 2015-04-03 SURGERY — MANOMETRY, ANORECTAL

## 2015-04-04 ENCOUNTER — Encounter (HOSPITAL_COMMUNITY): Payer: Self-pay | Admitting: Gastroenterology

## 2015-04-04 ENCOUNTER — Ambulatory Visit: Payer: 59 | Admitting: Podiatry

## 2015-05-18 DIAGNOSIS — K5900 Constipation, unspecified: Secondary | ICD-10-CM | POA: Insufficient documentation

## 2015-06-16 ENCOUNTER — Ambulatory Visit (INDEPENDENT_AMBULATORY_CARE_PROVIDER_SITE_OTHER): Payer: 59 | Admitting: Podiatry

## 2015-06-16 ENCOUNTER — Encounter: Payer: Self-pay | Admitting: Podiatry

## 2015-06-16 VITALS — BP 130/86 | HR 87 | Resp 16

## 2015-06-16 DIAGNOSIS — L600 Ingrowing nail: Secondary | ICD-10-CM | POA: Diagnosis not present

## 2015-06-16 NOTE — Patient Instructions (Signed)

## 2015-06-19 ENCOUNTER — Ambulatory Visit: Payer: 59 | Admitting: Podiatry

## 2015-06-19 NOTE — Progress Notes (Signed)
Subjective:     Patient ID: Maria Sharp, female   DOB: June 13, 1965, 50 y.o.   MRN: [id number]  HPI patient presents stating she's had some discomfort with several of her nails and is concerned because she has diabetes has obesity and cannot cut them herself   Review of Systems  All other systems reviewed and are negative.      Objective:   Physical Exam Neurovascular status found to be stable with muscle strength adequate with patient noted to have incurvated second and fifth nails right foot with discomfort also noted within the beds    Assessment:     Ingrown toenail deformity with diabetes and obesity is complicating factors    Plan:     H&P condition reviewed and debridement accomplished. This will need to be done periodically and patient will be seen back to recheck

## 2015-06-20 ENCOUNTER — Other Ambulatory Visit: Payer: Self-pay | Admitting: Obstetrics & Gynecology

## 2015-06-21 LAB — CYTOLOGY - PAP

## 2015-06-23 ENCOUNTER — Other Ambulatory Visit: Payer: Self-pay

## 2015-06-23 DIAGNOSIS — Z1231 Encounter for screening mammogram for malignant neoplasm of breast: Secondary | ICD-10-CM

## 2015-06-26 ENCOUNTER — Ambulatory Visit
Admission: RE | Admit: 2015-06-26 | Discharge: 2015-06-26 | Disposition: A | Discharge status: Home / Self Care | Payer: 59 | Source: Ambulatory Visit

## 2015-06-26 DIAGNOSIS — Z1231 Encounter for screening mammogram for malignant neoplasm of breast: Secondary | ICD-10-CM

## 2015-06-30 ENCOUNTER — Ambulatory Visit: Payer: 59 | Admitting: Podiatry

## 2015-07-03 ENCOUNTER — Ambulatory Visit: Payer: 59

## 2015-07-04 ENCOUNTER — Other Ambulatory Visit: Payer: Self-pay | Admitting: Obstetrics & Gynecology

## 2015-07-04 DIAGNOSIS — R928 Other abnormal and inconclusive findings on diagnostic imaging of breast: Secondary | ICD-10-CM

## 2015-07-07 ENCOUNTER — Ambulatory Visit
Admission: RE | Admit: 2015-07-07 | Discharge: 2015-07-07 | Disposition: A | Discharge status: Home / Self Care | Payer: 59 | Source: Ambulatory Visit | Attending: Obstetrics & Gynecology | Admitting: Obstetrics & Gynecology

## 2015-07-07 DIAGNOSIS — R928 Other abnormal and inconclusive findings on diagnostic imaging of breast: Secondary | ICD-10-CM

## 2015-08-21 LAB — HM DIABETES EYE EXAM

## 2015-09-12 ENCOUNTER — Ambulatory Visit: Payer: 59 | Admitting: Podiatry

## 2015-09-27 ENCOUNTER — Other Ambulatory Visit: Payer: Self-pay | Admitting: *Deleted

## 2015-09-27 MED ORDER — OMEPRAZOLE 40 MG PO CPDR: 40.0000 mg | DELAYED_RELEASE_CAPSULE | Freq: Every day | ORAL | Status: DC

## 2015-09-27 NOTE — Telephone Encounter (Signed)
Refilled omeprazole electronically per fax request

## 2015-10-03 ENCOUNTER — Ambulatory Visit (INDEPENDENT_AMBULATORY_CARE_PROVIDER_SITE_OTHER): Payer: 59 | Admitting: Family Medicine

## 2015-10-03 VITALS — BP 130/80 | HR 91 | Temp 97.9°F | Resp 17 | Ht 70.5 in | Wt 280.0 lb

## 2015-10-03 DIAGNOSIS — E669 Obesity, unspecified: Secondary | ICD-10-CM

## 2015-10-03 DIAGNOSIS — I10 Essential (primary) hypertension: Secondary | ICD-10-CM

## 2015-10-03 DIAGNOSIS — I868 Varicose veins of other specified sites: Secondary | ICD-10-CM | POA: Diagnosis not present

## 2015-10-03 DIAGNOSIS — E785 Hyperlipidemia, unspecified: Secondary | ICD-10-CM

## 2015-10-03 DIAGNOSIS — E1151 Type 2 diabetes mellitus with diabetic peripheral angiopathy without gangrene: Secondary | ICD-10-CM

## 2015-10-03 DIAGNOSIS — E559 Vitamin D deficiency, unspecified: Secondary | ICD-10-CM | POA: Diagnosis not present

## 2015-10-03 DIAGNOSIS — I8390 Asymptomatic varicose veins of unspecified lower extremity: Secondary | ICD-10-CM

## 2015-10-03 LAB — LIPID PANEL
Cholesterol: 186 mg/dL (ref 125–200)
HDL: 57 mg/dL (ref >=46)
LDL Cholesterol: 103 mg/dL (ref <130)
Total CHOL/HDL Ratio: 3.3 Ratio (ref <=5.0)
Triglycerides: 131 mg/dL (ref <150)
VLDL: 26 mg/dL (ref <30)

## 2015-10-03 LAB — POCT GLYCOSYLATED HEMOGLOBIN (HGB A1C): Hemoglobin A1C: 7

## 2015-10-03 LAB — COMPREHENSIVE METABOLIC PANEL
ALT: 19 U/L (ref 6–29)
AST: 13 U/L (ref 10–35)
Albumin: 4.1 g/dL (ref 3.6–5.1)
Alkaline Phosphatase: 53 U/L (ref 33–115)
BUN: 14 mg/dL (ref 7–25)
CO2: 27 mmol/L (ref 20–31)
Calcium: 9.5 mg/dL (ref 8.6–10.2)
Chloride: 97 mmol/L — ABNORMAL LOW (ref 98–110)
Creat: 0.69 mg/dL (ref 0.50–1.10)
Glucose, Bld: 118 mg/dL — ABNORMAL HIGH (ref 65–99)
Potassium: 3.7 mmol/L (ref 3.5–5.3)
Sodium: 137 mmol/L (ref 135–146)
Total Bilirubin: 0.3 mg/dL (ref 0.2–1.2)
Total Protein: 7.2 g/dL (ref 6.1–8.1)

## 2015-10-03 LAB — CBC
HCT: 36.8 % (ref 35.0–45.0)
Hemoglobin: 12.4 g/dL (ref 11.7–15.5)
MCH: 27.1 pg (ref 27.0–33.0)
MCHC: 33.7 g/dL (ref 32.0–36.0)
MCV: 80.3 fL (ref 80.0–100.0)
MPV: 9.1 fL (ref 7.5–12.5)
Platelets: 339 10*3/uL (ref 140–400)
RBC: 4.58 MIL/uL (ref 3.80–5.10)
RDW: 15.5 % — ABNORMAL HIGH (ref 11.0–15.0)
WBC: 9.2 10*3/uL (ref 3.8–10.8)

## 2015-10-03 LAB — TSH: TSH: 1.72 mIU/L

## 2015-10-03 LAB — GLUCOSE, POCT (MANUAL RESULT ENTRY): POC Glucose: 122 mg/dl — AB (ref 70–99)

## 2015-10-03 MED ORDER — TRIAMTERENE-HCTZ 37.5-25 MG PO TABS: 1.0000 | ORAL_TABLET | Freq: Every day | ORAL | Status: DC

## 2015-10-03 MED ORDER — OLMESARTAN MEDOXOMIL 20 MG PO TABS: 20.0000 mg | ORAL_TABLET | Freq: Every day | ORAL | Status: DC

## 2015-10-03 MED ORDER — BLOOD GLUCOSE MONITOR KIT: PACK | Status: DC

## 2015-10-03 MED ORDER — METFORMIN HCL ER 500 MG PO TB24: 2000.0000 mg | ORAL_TABLET | Freq: Every day | ORAL | Status: DC

## 2015-10-03 MED ORDER — OMEPRAZOLE 40 MG PO CPDR: 40.0000 mg | DELAYED_RELEASE_CAPSULE | Freq: Every day | ORAL | Status: DC

## 2015-10-03 NOTE — Progress Notes (Addendum)
By signing my name below, I, Mesha Guinyard, attest that this documentation has been prepared under the direction and in the presence of [name], MD.  Electronically Signed: Verlee Monte, Medical Scribe. 10/03/2015. 9:01 AM.  Subjective:    Patient ID: Maria Sharp, female    DOB: 26-Jun-1965, 51 y.o.   MRN: [id number]  HPI   Chief Complaint  Patient presents with  . Annual Exam    with no pap     HPI Comments: Maria Sharp is a 50 y.o. female who presents to the [facility] for medication refill. Pt was seen 1 year previously. Previously followed by [facility] by Dr Orlando Penner. Pt has a hx of medication non-compliance. Pt has no problems with her HCTZ prescribed by Dr. Stann Mainland- she wants a 30 day supply. Pt takes her medication when she gets off at work around [employer]. Pt doesn't take Vit. D supplements. Pt would like a referral for the vein specialist.  Sleep: Pt mentions she sleeps for about 5 hours. Pt often feels fatigue. Pt has times to sleep on her off days, but doesn't want to. Pt naps during the day. Pt states she wakes up about 2 times to use the bathroom while she sleeps. This wakes her up and she'll watch Tv for 2 hours. Pt drinks a lot of water while at work.  Immunizations: Immunization History  Administered Date(s) Administered  . Influenza,inj,Quad PF,36+ Mos 02/08/2013, 02/07/2014  . Pneumococcal Conjugate-13 02/08/2013  . Pneumococcal Polysaccharide-23 12/20/2013  . Tdap 06/14/2013   Cervical CA Screen: PMHx of abnormal pap around 2000 by Dr. Stann Mainland at [facility] 06/20/15 that was nl. Followed by gynocologist Dr. Harrington Challenger on OCP Breast CA Screening: Mammogram breast center. Last mammogram 3/14 /17 right breast had 2 asymmetries recommended additional imaging which was done and was found begning. Recommended repeat mamogram in 1 year. Pt reports there was a cyst found in her right breast.  Negative HCV and HIV screening in 2016  DM: hemoglobin and  A1c 1 year prior was 6.4. Pt is not checking her blood sugar at home and would like to get it checked here. Pt has gained 13 lbs in the past year. Pt takes two metformin pills after work. Pt states the metformin upsets her stomach. Pt doesn't want to be on insulin.  Diet and Exercise: Pt states her dit has been bad and she hasn't been exercising. She walked for 11 mins yesterday.  GI: [facility] due to hx of IBS and gastroparesis.  Cardio: EKG done 12/06/13 with RBBB. Seen by Dr. Percival Spanish 1.5 years ago for stress test which was negative. Pt advised no further work was needed.  Podiatry: Pt has been followed by podiatry Dr. Prudence Davidson  Patient Active Problem List   Diagnosis Date Noted  . Constipation   . Asthma, chronic 09/28/2014  . Gastroparesis   . Varicose veins 09/14/2013  . Neck pain on left side 06/14/2013  . Face pain 06/14/2013  . Current non-adherence to medical treatment 06/14/2013  . Medication side effect 02/10/2013  . Obesity (BMI 30-39.9) 02/10/2013  . Chest pain 01/29/2013  . Vitamin D deficiency 10/05/2012  . Leg cramps 10/05/2012  . Family history of diabetes mellitus 10/05/2012  . 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 ABDOMINAL PAIN, UNSPECIFIED SITE 05/18/2010  . CHEST PAIN, INTERMITTENT 05/12/2009  . OBESITY-MORBID (>100') 04/05/2009  . CARDIOMEGALY 04/05/2009  . PALPITATIONS 04/05/2009  . Type 2 diabetes mellitus with peripheral circulatory disorder ([place]) 03/15/2009  .  BACK PAIN 03/09/2008  . Hyperlipidemia LDL goal <100 03/03/2008  . Essential hypertension 03/03/2008  . GERD 03/03/2008  . IRRITABLE BOWEL SYNDROME 03/03/2008  . RECTAL BLEEDING 03/03/2008   Past Medical History  Diagnosis Date  . Diabetes mellitus   . High cholesterol   . IBS (irritable bowel syndrome)   . GERD (gastroesophageal reflux disease)   . Hypertension   . Gastritis   . Fatty liver   . History of varicella   . History of asthma   . Allergic   . Gastroparesis     study 11  14  nl abd [place]  . Allergy   . Asthma    Past Surgical History  Procedure Laterality Date  . Cholecystectomy    . Wisdom tooth extraction    . Anal rectal manometry N/A 04/03/2015    Procedure: ANO RECTAL MANOMETRY;  Surgeon: Mauri Pole, MD;  Location: [facility];  Service: Endoscopy;  Laterality: N/A;   Allergies  Allergen Reactions  . Ace Inhibitors Anaphylaxis  . Shellfish Allergy Anaphylaxis and Other (See Comments)    Only shrimp  . Januvia [Sitagliptin] Other (See Comments)    Recurrent abdominal pain with elevated pancreas enzymes  . Lipitor [Atorvastatin]    Prior to Admission medications   Medication Sig Start Date End Date Taking? Authorizing Provider  cetirizine (ZYRTEC ALLERGY) 10 MG tablet Take 1 tablet (10 mg total) by mouth daily. 03/24/14  Yes Noemi Chapel, MD  EPINEPHrine (EPI-PEN) 0.3 mg/0.3 mL DEVI Inject 0.3 mg into the muscle once as needed. For severe allergic reaction   Yes Historical Provider, MD  metFORMIN (GLUCOPHAGE-XR) 750 MG 24 hr tablet Take 1 tablet by mouth 2 (two) times daily. 02/02/15  Yes Historical Provider, MD  olmesartan (BENICAR) 20 MG tablet Take 1 tablet (20 mg total) by mouth daily. PATIENT NEEDS OFFICE VISIT FOR ADDITIONAL REFILLS 03/28/15  Yes Shawnee Knapp, MD  omeprazole (PRILOSEC) 40 MG capsule Take 1 capsule (40 mg total) by mouth daily. 09/27/15  Yes Mauri Pole, MD  polyethylene glycol powder (GLYCOLAX/MIRALAX) powder Take 8.5 g by mouth as needed. 02/13/15  Yes Mauri Pole, MD  triamterene-hydrochlorothiazide (MAXZIDE-25) 37.5-25 MG tablet Take 1 tablet by mouth daily. 01/26/15  Yes Historical Provider, MD  triamterene-hydrochlorothiazide (MAXZIDE-25) 37.5-25 MG tablet Take 1 tablet by mouth daily. PATIENT NEEDS OFFICE VISIT FOR ADDITIONAL REFILLS 03/28/15  Yes Shawnee Knapp, MD   Social History   Social History  . Marital Status: Single    Spouse Name: N/A  . Number of Children: N/A  . Years of Education: N/A    Occupational History  . Not on file.   Social History Main Topics  . Smoking status: Never Smoker   . Smokeless tobacco: Never Used  . Alcohol Use: No  . Drug Use: No  . Sexual Activity: Not on file   Other Topics Concern  . Not on file   Social History Narrative   Usually # of hours of sleep per night: 6   3 of people living at your residence? 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   Irregular sleep 7:30 to 11 or 12 and then 4 to 6:30 before she goes to work   Has attended college.   Originally from [place]   Abnormal pap remote last 2012  Depression screen [facility] 2/9 10/03/2015 09/22/2014 06/14/2013  Decreased Interest 0 0 0  Down, Depressed, Hopeless 0 0 0  PHQ - 2 Score 0 0 0    Review of Systems  Constitutional: Positive for fatigue (due to her job). Negative for fever.  HENT: Negative for rhinorrhea and sore throat.   Respiratory: Negative for cough.   Cardiovascular: Negative for chest pain.  Gastrointestinal: Negative for abdominal pain.  Genitourinary: Negative for dysuria, urgency and difficulty urinating.  Neurological: Negative for weakness and numbness.  Psychiatric/Behavioral: Positive for sleep disturbance (pt works 3rd shift).    Objective:  BP 130/80 mmHg  Pulse 91  Temp(Src) 97.9 F (36.6 C) (Oral)  Resp 17  Ht 5' 10.5" (1.791 m)  Wt 280 lb (127.007 kg)  BMI 39.59 kg/m2  SpO2 98%  LMP 09/12/2015  Physical Exam  Constitutional: She appears well-developed and well-nourished. No distress.  HENT:  Head: Normocephalic and atraumatic.  Eyes: Conjunctivae are normal.  Neck: Neck supple.  Cardiovascular: Normal rate, regular rhythm, S1 normal, S2 normal and normal heart sounds.  Exam reveals no gallop and no friction rub.   No murmur heard. Pulses:      Dorsalis pedis pulses are 2+ on the right side, and 2+ on the left side.   Pulmonary/Chest: Effort normal and breath sounds normal. No respiratory distress. She has no wheezes. She has no rales.  Abdominal: Bowel sounds are normal.  Neurological: She is alert.  Skin: Skin is warm and dry.  Psychiatric: She has a normal mood and affect. Her behavior is normal.  Nursing note and vitals reviewed.  Results for orders placed or performed in visit on 10/03/15  POCT glycosylated hemoglobin (Hb A1C)  Result Value Ref Range   Hemoglobin A1C 7.0   POCT glucose (manual entry)  Result Value Ref Range   POC Glucose 122 (A) 70 - 99 mg/dl   Assessment & Plan:   1. Essential hypertension - cont on triamterene-hctz 37.5-25 qd, olmesartan 20  2. Varicose veins   3. Type 2 diabetes mellitus with peripheral circulatory disorder (HCC)   4. Hyperlipidemia LDL goal <100   5. Obesity (BMI 30-39.9)   6. Vitamin D deficiency   7.       GERD - refilled prilosec 40 qd   Orders Placed This Encounter  Procedures  . Lipid panel    Order Specific Question:  Has the patient fasted?    Answer:  Yes  . Comprehensive metabolic panel    Order Specific Question:  Has the patient fasted?    Answer:  Yes  . TSH  . VITAMIN D 25 Hydroxy (Vit-D Deficiency, Fractures)  . CBC  . Microalbumin/Creatinine Ratio, Urine  . Care order/instruction    AVS and GO    Scheduling Instructions:     AVS and GO  . POCT glycosylated hemoglobin (Hb A1C)  . POCT glucose (manual entry)  . HM DIABETES FOOT EXAM    Meds ordered this encounter  Medications  . blood glucose meter kit and supplies KIT    Sig: Dispense based on patient and insurance preference. Use up to four times daily as directed. (FOR ICD-9 250.00, 250.01).    Dispense:  1 each    Refill:  0    Order Specific Question:  Number of strips    Answer:  1000    Order Specific Question:  Number of lancets    Answer:  1000  . triamterene-hydrochlorothiazide (MAXZIDE-25) 37.5-25 MG tablet  Sig: Take 1 tablet by mouth daily.     Dispense:  90 tablet    Refill:  3  . omeprazole (PRILOSEC) 40 MG capsule    Sig: Take 1 capsule (40 mg total) by mouth daily.    Dispense:  30 capsule    Refill:  6  . olmesartan (BENICAR) 20 MG tablet    Sig: Take 1 tablet (20 mg total) by mouth daily.    Dispense:  90 tablet    Refill:  3  . metFORMIN (GLUCOPHAGE-XR) 500 MG 24 hr tablet    Sig: Take 4 tablets (2,000 mg total) by mouth daily with breakfast.    Dispense:  360 tablet    Refill:  3    I personally performed the services described in this documentation, which was scribed in my presence. The recorded information has been reviewed and considered, and addended by me as needed.   [name], M.D.  [facility] [address] [place], [place] [address] [phone] phone [phone] fax  10/16/2015 11:11 AM

## 2015-10-03 NOTE — Patient Instructions (Addendum)
IF you received an x-ray today, you will receive an invoice from [facility] Radiology. Please contact [facility]iology at [phone] with questions or concerns regarding your invoice.   IF you received labwork today, you will receive an invoice from [employer]. Please contact [facility] at [phone] with questions or concerns regarding your invoice.   Our billing staff will not be able to assist you with questions regarding bills from these companies.  You will be contacted with the lab results as soon as they are available. The fastest way to get your results is to activate your My Chart account. Instructions are located on the last page of this paperwork. If you have not heard from [place] regarding the results in 2 weeks, please contact this office.     We recommend that you schedule a mammogram for breast cancer screening. Typically, you do not need a referral to do this. Please contact a local imaging center to schedule your mammogram.  [facility] - [phone]  *ask for the Radiology Department [facility] ([place]) - [phone] or [phone]  [facility] - [phone] [place] [phone] [facility] - [phone]  *ask for the [facility] - [phone]  *ask for the Radiology Department [facility] - [phone]  *ask for the [place] - [phone]   Diabetes and Standards of Medical Care Diabetes is complicated. You may find that your diabetes team includes a dietitian, nurse, diabetes educator, eye doctor, and more. To help everyone know what is going on and to help you get the care you deserve, the following schedule of care was developed to help keep you on track. Below are the tests, exams, vaccines, medicines, education, and plans you will need. HbA1c test This test shows how  well you have controlled your glucose over the past 2-3 months. It is used to see if your diabetes management plan needs to be adjusted.   It is performed at least 2 times a year if you are meeting treatment goals.  It is performed 4 times a year if therapy has changed or if you are not meeting treatment goals. Blood pressure test  This test is performed at every routine medical visit. The goal is less than 140/90 mm Hg for most people, but 130/80 mm Hg in some cases. Ask your health care provider about your goal. Dental exam  Follow up with the dentist regularly. Eye exam  If you are diagnosed with type 1 diabetes as a child, get an exam upon reaching the age of 55 years or older and having had diabetes for 3-5 years. Yearly eye exams are recommended after that initial eye exam.  If you are diagnosed with type 1 diabetes as an adult, get an exam within 5 years of diagnosis and then yearly.  If you are diagnosed with type 2 diabetes, get an exam as soon as possible after the diagnosis and then yearly. Foot care exam  Visual foot exams are performed at every routine medical visit. The exams check for cuts, injuries, or other problems with the feet.  You should have a complete foot exam performed every year. This exam includes an inspection of the structure and skin of your feet, a check of the pulses in your feet, and a check of the sensation in your feet.  Type 1 diabetes: The first exam is performed 5 years  after diagnosis.  Type 2 diabetes: The first exam is performed at the time of diagnosis.  Check your feet nightly for cuts, injuries, or other problems with your feet. Tell your health care provider if anything is not healing. Kidney function test (urine microalbumin)  This test is performed once a year.  Type 1 diabetes: The first test is performed 5 years after diagnosis.  Type 2 diabetes: The first test is performed at the time of diagnosis.  A serum creatinine and  estimated glomerular filtration rate (eGFR) test is done once a year to assess the level of chronic kidney disease (CKD), if present. Lipid profile (cholesterol, HDL, LDL, triglycerides)  Performed every 5 years for most people.  The goal for LDL is less than 100 mg/dL. If you are at high risk, the goal is less than 70 mg/dL.  The goal for HDL is 40 mg/dL-50 mg/dL for men and 50 mg/dL-60 mg/dL for women. An HDL cholesterol of 60 mg/dL or higher gives some protection against heart disease.  The goal for triglycerides is less than 150 mg/dL. Immunizations  The flu (influenza) vaccine is recommended yearly for every person 13 months of age or older who has diabetes.  The pneumonia (pneumococcal) vaccine is recommended for every person 5 years of age or older who has diabetes. Adults 89 years of age or older may receive the pneumonia vaccine as a series of two separate shots.  The hepatitis B vaccine is recommended for adults shortly after they have been diagnosed with diabetes.  The Tdap (tetanus, diphtheria, and pertussis) vaccine should be given:  According to normal childhood vaccination schedules, for children.  Every 10 years, for adults who have diabetes. Diabetes self-management education  Education is recommended at diagnosis and ongoing as needed. Treatment plan  Your treatment plan is reviewed at every medical visit.   This information is not intended to replace advice given to you by your health care provider. Make sure you discuss any questions you have with your health care provider.   Document Released: 02/03/2009 Document Revised: 04/29/2014 Document Reviewed: 09/08/2012 Elsevier Interactive Patient Education 2016 Elsevier Inc. Blood Glucose Monitoring, Adult Monitoring your blood glucose (also know as blood sugar) helps you to manage your diabetes. It also helps you and your health care provider monitor your diabetes and determine how well your treatment plan is  working. WHY SHOULD YOU MONITOR YOUR BLOOD GLUCOSE?  It can help you understand how food, exercise, and medicine affect your blood glucose.  It allows you to know what your blood glucose is at any given moment. You can quickly tell if you are having low blood glucose (hypoglycemia) or high blood glucose (hyperglycemia).  It can help you and your health care provider know how to adjust your medicines.  It can help you understand how to manage an illness or adjust medicine for exercise. WHEN SHOULD YOU TEST? Your health care provider will help you decide how often you should check your blood glucose. This may depend on the type of diabetes you have, your diabetes control, or the types of medicines you are taking. Be sure to write down all of your blood glucose readings so that this information can be reviewed with your health care provider. See below for examples of testing times that your health care provider may suggest. Type 1 Diabetes  Test at least 2 times per day if your diabetes is well controlled, if you are using an insulin pump, or if you perform multiple daily  injections.  If your diabetes is not well controlled or if you are sick, you may need to test more often.  It is a good idea to also test:  Before every insulin injection.  Before and after exercise.  Between meals and 2 hours after a meal.  Occasionally between 2:00 a.m. and 3:00 a.m. Type 2 Diabetes  If you are taking insulin, test at least 2 times per day. However, it is best to test before every insulin injection.  If you take medicines by mouth (orally), test 2 times a day.  If you are on a controlled diet, test once a day.  If your diabetes is not well controlled or if you are sick, you may need to monitor more often. HOW TO MONITOR YOUR BLOOD GLUCOSE Supplies Needed  Blood glucose meter.  Test strips for your meter. Each meter has its own strips. You must use the strips that go with your own meter.  A  pricking needle (lancet).  A device that holds the lancet (lancing device).  A journal or log book to write down your results. Procedure  Wash your hands with soap and water. Alcohol is not preferred.  Prick the side of your finger (not the tip) with the lancet.  Gently milk the finger until a small drop of blood appears.  Follow the instructions that come with your meter for inserting the test strip, applying blood to the strip, and using your blood glucose meter. Other Areas to Get Blood for Testing Some meters allow you to use other areas of your body (other than your finger) to test your blood. These areas are called alternative sites. The most common alternative sites are:  The forearm.  The thigh.  The back area of the lower leg.  The palm of the hand. The blood flow in these areas is slower. Therefore, the blood glucose values you get may be delayed, and the numbers are different from what you would get from your fingers. Do not use alternative sites if you think you are having hypoglycemia. Your reading will not be accurate. Always use a finger if you are having hypoglycemia. Also, if you cannot feel your lows (hypoglycemia unawareness), always use your fingers for your blood glucose checks. ADDITIONAL TIPS FOR GLUCOSE MONITORING  Do not reuse lancets.  Always carry your supplies with you.  All blood glucose meters have a 24-hour "hotline" number to call if you have questions or need help.  Adjust (calibrate) your blood glucose meter with a control solution after finishing a few boxes of strips. BLOOD GLUCOSE RECORD KEEPING It is a good idea to keep a daily record or log of your blood glucose readings. Most glucose meters, if not all, keep your glucose records stored in the meter. Some meters come with the ability to download your records to your home computer. Keeping a record of your blood glucose readings is especially helpful if you are wanting to look for patterns.  Make notes to go along with the blood glucose readings because you might forget what happened at that exact time. Keeping good records helps you and your health care provider to work together to achieve good diabetes management.    This information is not intended to replace advice given to you by your health care provider. Make sure you discuss any questions you have with your health care provider.   Document Released: 04/11/2003 Document Revised: 04/29/2014 Document Reviewed: 08/31/2012 Elsevier Interactive Patient Education [employer].

## 2015-10-04 LAB — MICROALBUMIN / CREATININE URINE RATIO
Creatinine, Urine: 164 mg/dL (ref 20–320)
Microalb Creat Ratio: 2 mcg/mg creat (ref <30)
Microalb, Ur: 0.4 mg/dL

## 2015-10-04 LAB — VITAMIN D 25 HYDROXY (VIT D DEFICIENCY, FRACTURES): Vit D, 25-Hydroxy: 28 ng/mL — ABNORMAL LOW (ref 30–100)

## 2015-11-16 ENCOUNTER — Telehealth: Payer: Self-pay

## 2015-11-16 NOTE — Telephone Encounter (Signed)
PATIENT STATES SHE HAD HER COMPLETE PHYSICAL DONE IN June WITH DR. SHAW. SHE HAS NEVER GOTTEN HER LAB RESULTS. SHE HAD HER VITAMIN D AND CHOLESTROL CHECKED. SHE ALSO WOULD LIKE TO KNOW HOW OFTEN SHE SHOULD RETURN TO HAVE HER LABS CHECKED? BEST PHONE [phone] (CELL)   [place].  [place]

## 2015-11-16 NOTE — Telephone Encounter (Signed)
Dr. Brigitte Pulse I didn't see any notes on the labs or a letter that was sent. Please review.

## 2015-11-17 NOTE — Telephone Encounter (Signed)
All labs are normal though vit D on the low side of normal. So start 1000u of otc vit D I will send her a letter in the mail with the actual results.  Cholesterol was also borderline so work on decreasing fat in the diet a little more. She should follow-up to recheck her diabetes in 4 to 6 months

## 2015-11-21 ENCOUNTER — Telehealth: Payer: Self-pay | Admitting: Cardiology

## 2015-11-21 NOTE — Telephone Encounter (Signed)
Per pt some chest discomfort for the pass week .   Pt would like to sees Dr Percival Spanish within the next week please.

## 2015-11-21 NOTE — Telephone Encounter (Signed)
Agree with recommendation  Peter Jordan MD, FACC   

## 2015-11-21 NOTE — Telephone Encounter (Signed)
Patient c/o intermittent, occasional chest pain for past 1 week. She has also had back and side pain. She has had other symptoms which lead her to believe it is "gas"/reflux, but wanted to have an evaluation to make sure no heart involvement.  She denies SOB, dizziness, fatigue, nausea, arm jaw or neck pain. She is aware if she develops any of these symptoms or has active chest pain which does not resolve w/in a few minutes, she should seek ED evaluation.  States symptoms similar to issues she was having 2 years ago when she saw Dr. Percival Spanish last.  At that time symptoms had been eval'd in ED and by Dr. Percival Spanish and were not thought to be cardiac in nature.  I have checked schedule and currently Dr. Percival Spanish has no openings until Sept. I have sent a message to schedulers to see if pt can be added for a 72 hr slot on [place] calendar. Pt aware we will call w/ appt. Routed to [facility] for any additional advice.

## 2015-11-22 NOTE — Telephone Encounter (Signed)
Follow up scheduled with dr hochrein 12-01-15 in [place]

## 2015-11-28 ENCOUNTER — Encounter: Payer: Self-pay | Admitting: Family Medicine

## 2015-11-30 ENCOUNTER — Encounter: Payer: Self-pay | Admitting: *Deleted

## 2015-11-30 NOTE — Progress Notes (Signed)
 HPI The patient presents for evaluation of chest discomfort. I saw her in follow up of this in 2015.  She had a negative POET (Plain Old Exercise Treadmill) in January of last year.  However, she called recently with chest pain.  She reported some chest discomfort that have been a little over a week ago and was mild. However, on the weekend on Saturday she had more severe discomfort that was upper epigastric and radiating under both ribs around to the back into her shoulder blades. This was somewhat sharp. She drank Pepsi and had a lot of gas and her symptoms resolved and she has since not had any symptoms. She's not very active but she works third shift driving a forklift. With this she denies any neurovascular symptoms such as chest discomfort, neck or arm discomfort. She's not having any new shortness of breath, PND or orthopnea.  She has had no palpitations, presyncope or syncope.  Allergies  Allergen Reactions  . Ace Inhibitors Anaphylaxis  . Shellfish Allergy Anaphylaxis and Other (See Comments)    Only shrimp  . Januvia [Sitagliptin] Other (See Comments)    Recurrent abdominal pain with elevated pancreas enzymes  . Lipitor [Atorvastatin]     Current Outpatient Prescriptions  Medication Sig Dispense Refill  . blood glucose meter kit and supplies KIT Dispense based on patient and insurance preference. Use up to four times daily as directed. (FOR ICD-9 250.00, 250.01). 1 each 0  . cetirizine (ZYRTEC ALLERGY) 10 MG tablet Take 1 tablet (10 mg total) by mouth daily. 30 tablet 1  . EPINEPHrine (EPI-PEN) 0.3 mg/0.3 mL DEVI Inject 0.3 mg into the muscle once as needed. For severe allergic reaction    . metFORMIN (GLUCOPHAGE-XR) 500 MG 24 hr tablet Take 4 tablets (2,000 mg total) by mouth daily with breakfast. 360 tablet 3  . olmesartan (BENICAR) 20 MG tablet Take 1 tablet (20 mg total) by mouth daily. 90 tablet 3  . omeprazole (PRILOSEC) 40 MG capsule Take 1 capsule (40 mg total) by mouth  daily. 30 capsule 6  . polyethylene glycol powder (GLYCOLAX/MIRALAX) powder Take 8.5 g by mouth as needed. 255 g 3  . triamterene-hydrochlorothiazide (MAXZIDE-25) 37.5-25 MG tablet Take 1 tablet by mouth daily. 90 tablet 3  . Vitamin Mixture (VITAMIN E COMPLETE PO) Take by mouth.     No current facility-administered medications for this visit.     Past Medical History:  Diagnosis Date  . Allergy   . Asthma   . Diabetes mellitus   . Fatty liver   . Gastritis   . Gastroparesis    study 11 14  nl abd [place]  . GERD (gastroesophageal reflux disease)   . High cholesterol   . History of varicella   . Hypertension   . IBS (irritable bowel syndrome)     Past Surgical History:  Procedure Laterality Date  . ANAL RECTAL MANOMETRY N/A 04/03/2015   Procedure: ANO RECTAL MANOMETRY;  Surgeon: Kavitha Nandigam V, MD;  Location: [facility];  Service: Endoscopy;  Laterality: N/A;  . CHOLECYSTECTOMY    . WISDOM TOOTH EXTRACTION      ROS:  As stated in the HPI and negative for all other systems.  PHYSICAL EXAM BP 112/80   Pulse 88   Ht 5' 10" (1.778 m)   Wt 277 lb (125.6 kg)   SpO2 97%   BMI 39.75 kg/m  GENERAL:  Well appearing NECK:  No jugular venous distention, waveform within normal limits, carotid upstroke brisk   and symmetric, no bruits, no thyromegaly LUNGS:  Clear to auscultation bilaterally BACK:  No CVA tenderness CHEST:  Unremarkable HEART:  PMI not displaced or sustained,S1 and S2 within normal limits, no S3, no S4, no clicks, no rubs, no murmurs ABD:  Flat, positive bowel sounds normal in frequency in pitch, no bruits, no rebound, no guarding, no midline pulsatile mass, no hepatomegaly, no splenomegaly EXT:  2 plus pulses throughout, no edema, no cyanosis no clubbing  Lab Results  Component Value Date   HGBA1C 7.0 10/03/2015     EKG:   Normal sinus rhythm, rate 88, incomplete right bundle branch block, LAD, no acute ST-T wave changes. 12/02/2015  ASSESSMENT AND  PLAN  CHEST PAIN The pain is atypical.  I think the pretest probability of obstructive coronary disease is very low particularly given the negative stress test last year. I don't think further workup is indicated unless she has recurrent symptoms and she and I talked about this.   HYPERTENSION Her blood pressure is controlled and she will continue the meds as listed.   DM A1 C. Was 7 and she will continue to follow up with SHAW,EVA, MD   OBESITY The patient understands the need to lose weight with diet and exercise. We have discussed specific strategies for this.

## 2015-12-01 ENCOUNTER — Encounter: Payer: Self-pay | Admitting: Cardiology

## 2015-12-01 ENCOUNTER — Ambulatory Visit (INDEPENDENT_AMBULATORY_CARE_PROVIDER_SITE_OTHER): Payer: 59 | Admitting: Cardiology

## 2015-12-01 VITALS — BP 112/80 | HR 88 | Ht 70.0 in | Wt 277.0 lb

## 2015-12-01 DIAGNOSIS — R079 Chest pain, unspecified: Secondary | ICD-10-CM | POA: Diagnosis not present

## 2015-12-01 NOTE — Patient Instructions (Signed)
Medication Instructions:   Your physician recommends that you continue on your current medications as directed. Please refer to the Current Medication list given to you today.  Labwork:  NONE  Testing/Procedures:  NONE  Follow-Up:  Your physician recommends that you schedule a follow-up appointment in: as needed.   Any Other Special Instructions Will Be Listed Below (If Applicable).  If you need a refill on your cardiac medications before your next appointment, please call your pharmacy. 

## 2015-12-02 ENCOUNTER — Encounter: Payer: Self-pay | Admitting: Cardiology

## 2015-12-02 IMAGING — DX DG CHEST 2V
2 series · 2 of 2 positions shown · non-contrast
Comparison: 11/12/2013

CLINICAL DATA: Cough, shortness of breath, and wheezing since
yesterday.

EXAM:
CHEST  2 VIEW

[chest pa]
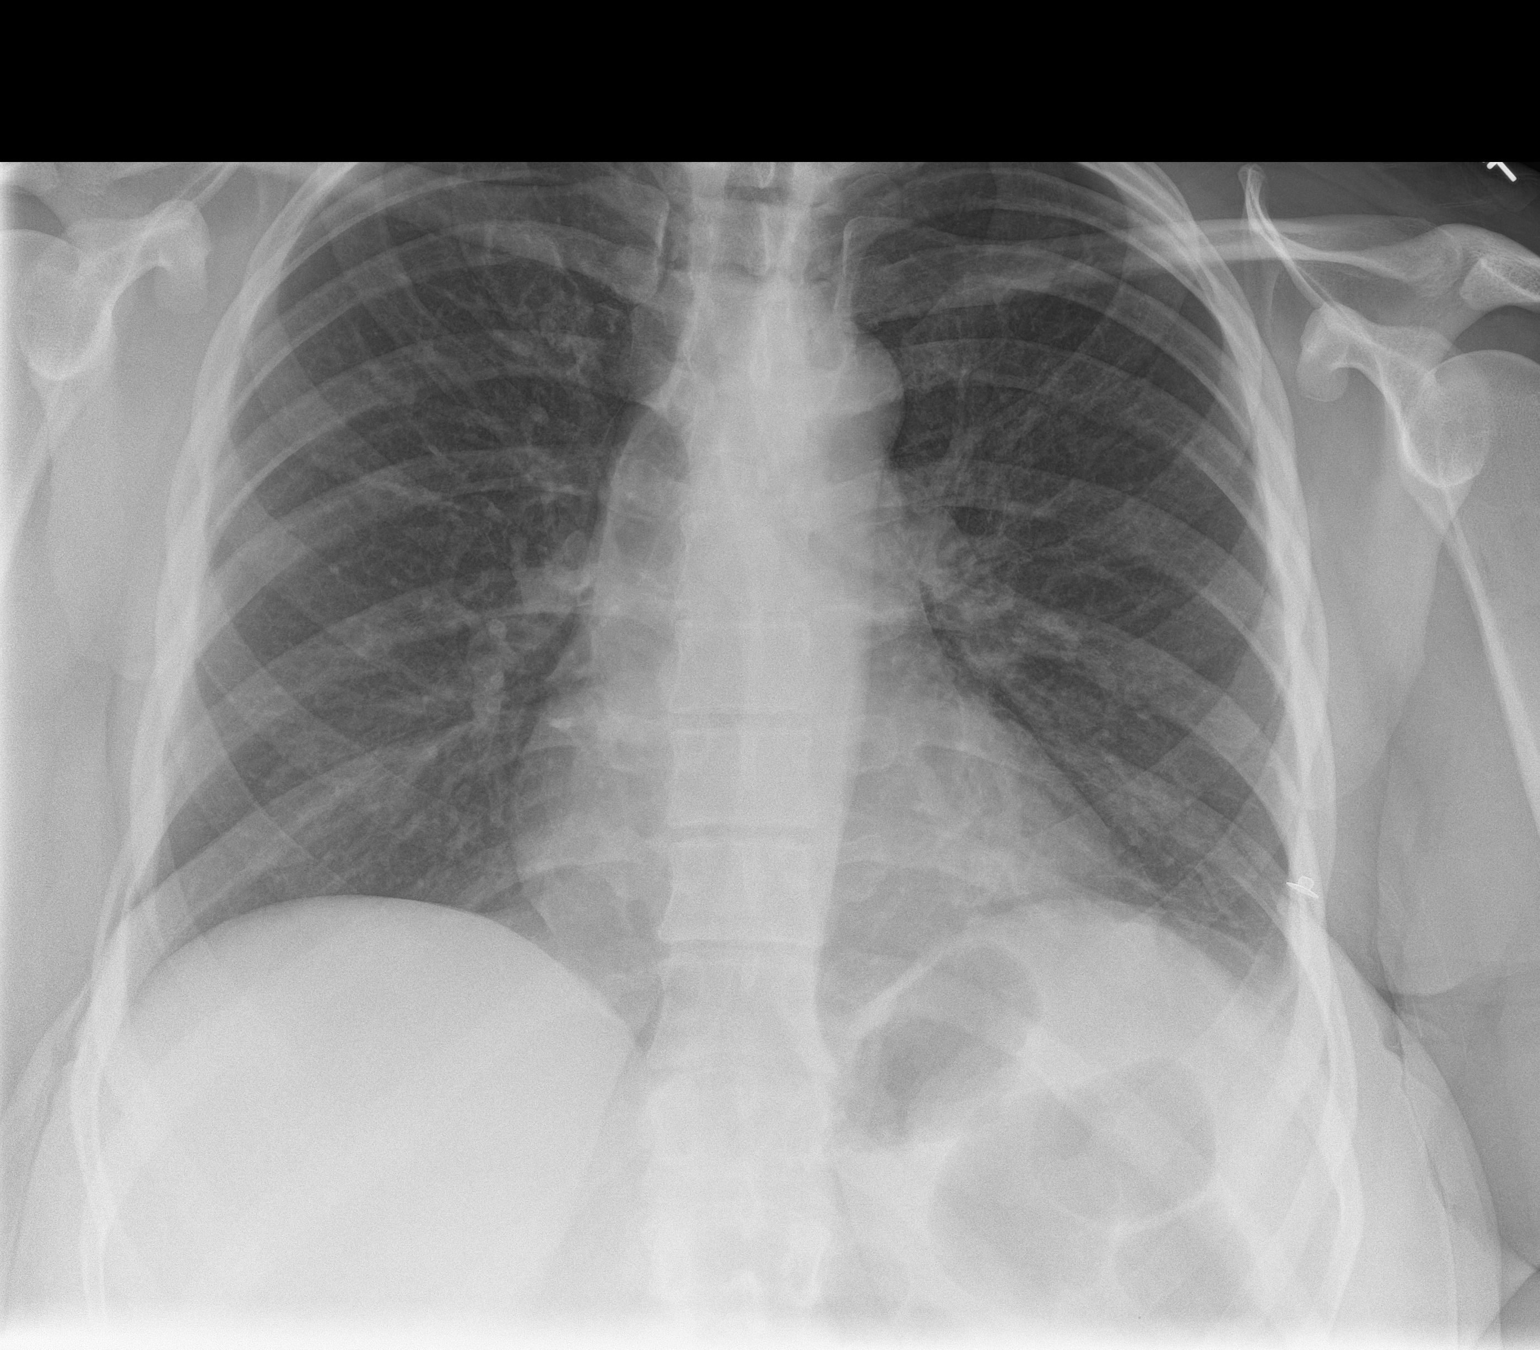

[chest lat]
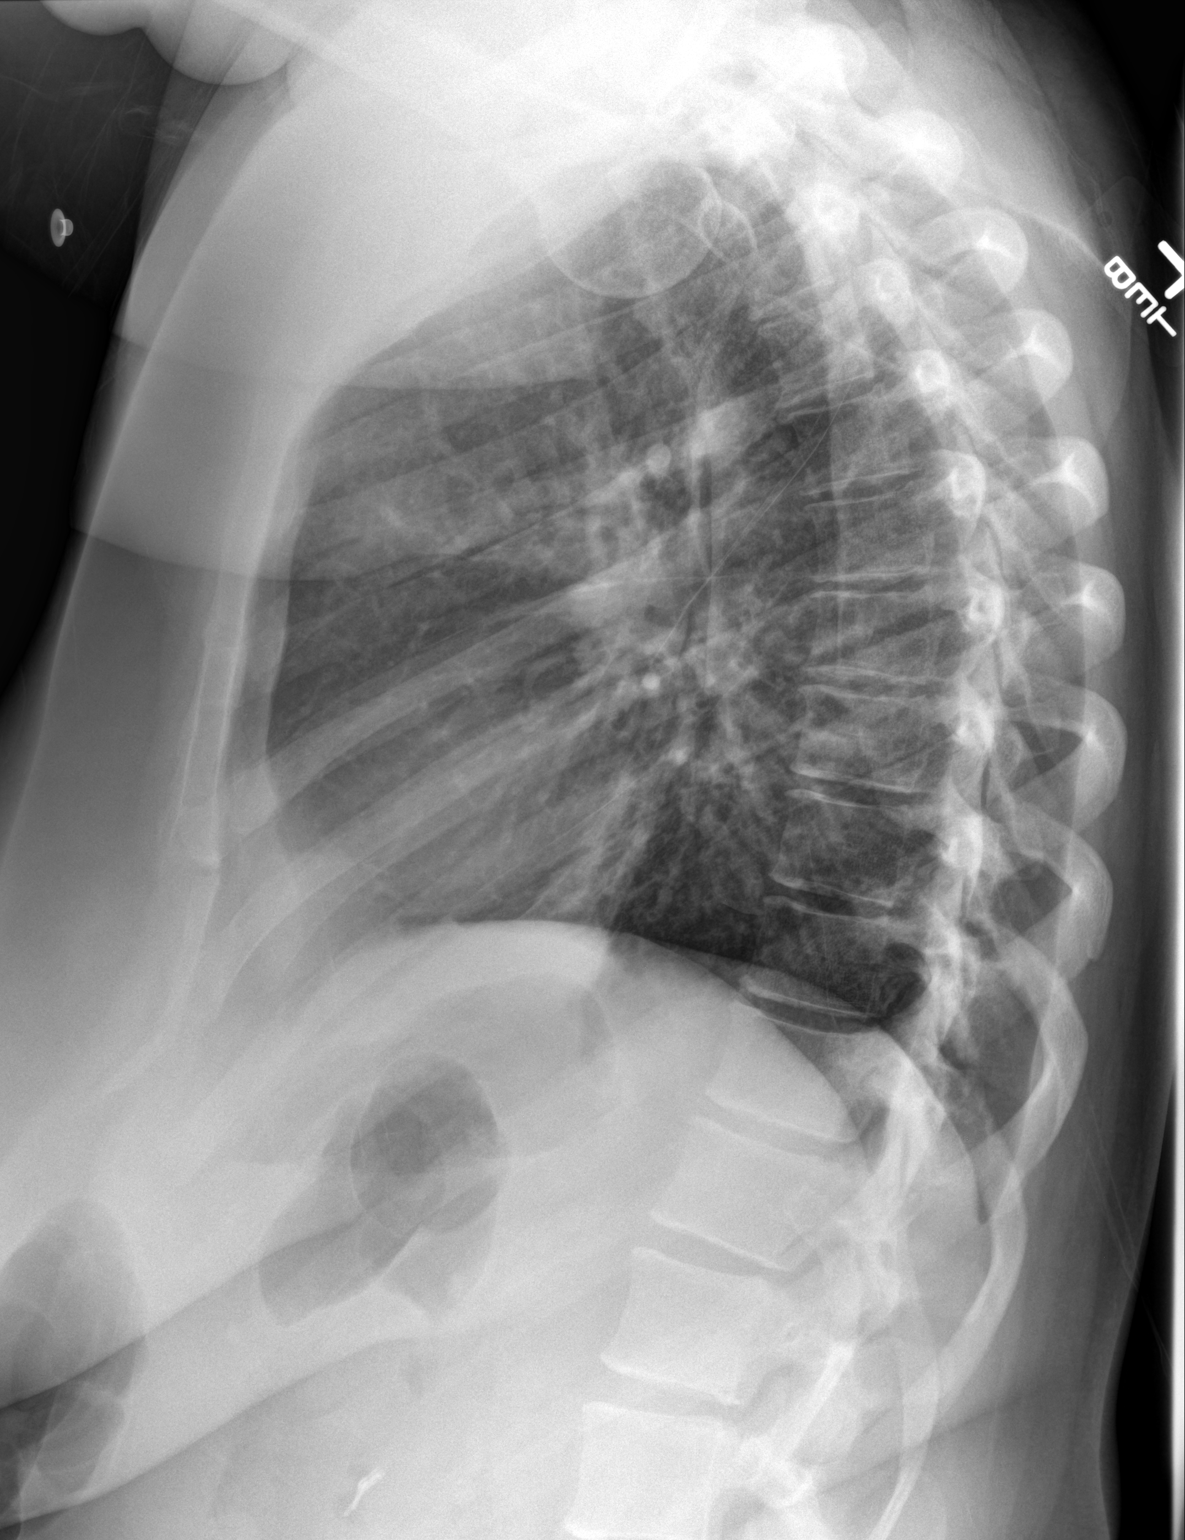

[2 of 2 positions shown; findings below may reference images not displayed]

FINDINGS: Shallow inspiration with probable linear atelectasis in the left
lung base. Normal heart size and pulmonary vascularity. No focal
consolidation in the lungs. No blunting of costophrenic angles. No
pneumothorax. Mediastinal contours appear intact.
IMPRESSION: Atelectasis in the left lung base.

## 2016-02-19 ENCOUNTER — Ambulatory Visit (INDEPENDENT_AMBULATORY_CARE_PROVIDER_SITE_OTHER): Payer: 59 | Admitting: Orthopedic Surgery

## 2016-02-19 ENCOUNTER — Encounter (INDEPENDENT_AMBULATORY_CARE_PROVIDER_SITE_OTHER): Payer: Self-pay | Admitting: Orthopedic Surgery

## 2016-02-19 DIAGNOSIS — M23200 Derangement of unspecified lateral meniscus due to old tear or injury, right knee: Secondary | ICD-10-CM | POA: Diagnosis not present

## 2016-02-19 MED ORDER — CELECOXIB 100 MG PO CAPS: 100.0000 mg | ORAL_CAPSULE | Freq: Two times a day (BID) | ORAL | 1 refills | Status: DC

## 2016-02-19 NOTE — Progress Notes (Signed)
Office Visit Note   Patient: Maria Sharp           Date of Birth: 03-28-1966           MRN: [id number] Visit Date: 02/19/2016 Requested by: Shawnee Knapp, MD [address] [place], [place] [address] PCP: [name], MD  Subjective: No chief complaint on file. Right knee pain  HPI Tecia is a 50 year old patient with right knee pain.  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  She does describe some locking of the right knee about 1 time a month.  Injection occurred more than a year ago and it was helpful.  She doesn't do much exercise and she hasn't taken much medication but has used Celebrex in the past with relief.  She works as a Freight forwarder.  She's recently been taking care of her mother and thus has not been very active on her right knee              Review of Systems all systems reviewed and negative Z related to the right knee.  No fevers or chills.   Assessment & Plan: Visit Diagnoses:  1. Old tear of lateral meniscus of right knee, unspecified tear type     Plan: Impression is sporadically symptomatic lateral meniscal tear in the right knee.  She probably has some underlying arthritis developing as well but today there is no effusion.  Plan is to prescribe Celebrex.  Also going to renew her handicap permit because she states she cannot walk more than 200 feet without stopping.  She will follow up with me when her symptoms worsen for consideration of possible injection and/or arthroscopy  Follow-Up Instructions: Return if symptoms worsen or fail to improve.   Orders:  No orders of the defined types were placed in this encounter.  Meds ordered this encounter  Medications  . celecoxib (CELEBREX) 100 MG capsule    Sig: Take 1 capsule (100 mg total) by mouth 2 (two) times daily.    Dispense:  60 capsule    Refill:  1      Procedures: No procedures performed   Clinical Data: No additional findings.  Objective: Vital Signs: There were  no vitals taken for this visit.  Physical Exam  Constitutional: She appears well-developed.  HENT:  Head: Normocephalic.  Eyes: EOM are normal.  Neck: Normal range of motion.  Cardiovascular: Normal rate.   Pulmonary/Chest: Effort normal.  Neurological: She is alert.  Skin: Skin is warm.  Psychiatric: She has a normal mood and affect.    Ortho Exam examination of the right knee demonstrates no effusion and full range of motion medial and lateral joint line tenderness mild varicosities but no calf tenderness is present pedal pulses palpable no groin pain with internal/external rotation of the leg collateral cruciate ligaments are stable  Specialty Comments:  No specialty comments available.  Imaging: No results found.   PMFS History: Patient Active Problem List   Diagnosis Date Noted  . Constipation   . Asthma, chronic 09/28/2014  . Gastroparesis   . Varicose veins 09/14/2013  . Neck pain on left side 06/14/2013  . Face pain 06/14/2013  . Current non-adherence to medical treatment 06/14/2013  . Medication side effect 02/10/2013  . Obesity (BMI 30-39.9) 02/10/2013  . Chest pain 01/29/2013  . Vitamin D deficiency 10/05/2012  . Leg cramps 10/05/2012  . Family history of diabetes mellitus 10/05/2012  . 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 ABDOMINAL PAIN,  UNSPECIFIED SITE 05/18/2010  . CHEST PAIN, INTERMITTENT 05/12/2009  . OBESITY-MORBID (>100') 04/05/2009  . CARDIOMEGALY 04/05/2009  . PALPITATIONS 04/05/2009  . Type 2 diabetes mellitus with peripheral circulatory disorder ([place]) 03/15/2009  . BACK PAIN 03/09/2008  . Hyperlipidemia LDL goal <100 03/03/2008  . Essential hypertension 03/03/2008  . GERD 03/03/2008  . IRRITABLE BOWEL SYNDROME 03/03/2008  . RECTAL BLEEDING 03/03/2008   Past Medical History:  Diagnosis Date  . Allergy   . Asthma   . Diabetes mellitus   . Fatty liver   . Gastritis   . Gastroparesis    study 11 14  nl abd [place]  . GERD (gastroesophageal  reflux disease)   . High cholesterol   . History of varicella   . Hypertension   . IBS (irritable bowel syndrome)     Family History  Problem Relation Age of Onset  . Prostate cancer Father   . Diabetes Father   . Hypertension Father   . Diabetes Mother   . Heart disease Mother   . Arthritis Mother   . Hyperlipidemia Mother   . Hypertension Mother   . Parkinson's disease Mother   . Thyroid disease Mother   . Diabetes Paternal Grandmother   . Arthritis Paternal Grandmother   . Hyperlipidemia Paternal Grandmother   . Heart disease Paternal Grandmother   . Dementia Paternal Grandmother   . Hypertension Paternal Grandmother   . Hyperlipidemia Maternal Grandmother   . Hypertension Maternal Grandmother   . Hyperlipidemia Maternal Grandfather   . Hypertension Maternal Grandfather   . Hyperlipidemia Paternal Grandfather   . Hypertension Paternal Grandfather   . Diabetes Paternal Grandfather   . Cholelithiasis Paternal Grandfather   . Colon cancer Neg Hx   . Esophageal cancer Neg Hx   . Stomach cancer Neg Hx   . Rectal cancer Neg Hx     Past Surgical History:  Procedure Laterality Date  . ANAL RECTAL MANOMETRY N/A 04/03/2015   Procedure: ANO RECTAL MANOMETRY;  Surgeon: Mauri Pole, MD;  Location: [facility];  Service: Endoscopy;  Laterality: N/A;  . CHOLECYSTECTOMY    . WISDOM TOOTH EXTRACTION     Social History   Occupational History  . Not on file.   Social History Main Topics  . Smoking status: Never Smoker  . Smokeless tobacco: Never Used  . Alcohol use No  . Drug use: No  . Sexual activity: Not on file

## 2016-03-28 ENCOUNTER — Ambulatory Visit: Payer: 59 | Admitting: Family Medicine

## 2016-05-03 ENCOUNTER — Ambulatory Visit: Payer: 59 | Admitting: Physician Assistant

## 2016-05-06 ENCOUNTER — Ambulatory Visit (INDEPENDENT_AMBULATORY_CARE_PROVIDER_SITE_OTHER): Payer: 59 | Admitting: Gastroenterology

## 2016-05-06 ENCOUNTER — Encounter (INDEPENDENT_AMBULATORY_CARE_PROVIDER_SITE_OTHER): Payer: Self-pay

## 2016-05-06 ENCOUNTER — Encounter: Payer: Self-pay | Admitting: Gastroenterology

## 2016-05-06 VITALS — BP 130/84 | Ht 70.0 in | Wt 282.0 lb

## 2016-05-06 DIAGNOSIS — K3184 Gastroparesis: Secondary | ICD-10-CM | POA: Diagnosis not present

## 2016-05-06 DIAGNOSIS — K5900 Constipation, unspecified: Secondary | ICD-10-CM | POA: Diagnosis not present

## 2016-05-06 DIAGNOSIS — K582 Mixed irritable bowel syndrome: Secondary | ICD-10-CM | POA: Diagnosis not present

## 2016-05-06 DIAGNOSIS — R110 Nausea: Secondary | ICD-10-CM | POA: Diagnosis not present

## 2016-05-06 DIAGNOSIS — K219 Gastro-esophageal reflux disease without esophagitis: Secondary | ICD-10-CM

## 2016-05-06 NOTE — Progress Notes (Signed)
Maria Sharp    [id number]    06/12/1965  Primary Care Physician:SHAW,EVA, MD  Referring Physician: Shawnee Knapp, MD [address] [place], [place] [address]  Chief complaint:  Nausea, heartburn, diarrhea, constipation  HPI: 51 year old female here with complaints of alternating constipation and diarrhea. She has history of gastroparesis based on a 2 hour gastric emptying study but doesn't have any typical symptoms of gastroparesis other than intermittent nausea and heartburn. She is not any any promotility agents. She continues to have alternating constipation and diarrhea. She can go without bowel movements for 2-3 days followed by multiple bowel movements about 4-5 mostly formed to semi-formed .No nocturnal episodes of diarrhea. She is taking MiraLAX as needed. Last colonoscopy in 2009 was normal except for small internal hemorrhoids.Denies any dysphagia, odynophagia, vomiting, melena or blood per rectum   Outpatient Encounter Prescriptions as of 05/06/2016  Medication Sig  . blood glucose meter kit and supplies KIT Dispense based on patient and insurance preference. Use up to four times daily as directed. (FOR ICD-9 250.00, 250.01).  . celecoxib (CELEBREX) 100 MG capsule Take 1 capsule (100 mg total) by mouth 2 (two) times daily. (Patient taking differently: Take 100 mg by mouth as needed. )  . cetirizine (ZYRTEC ALLERGY) 10 MG tablet Take 1 tablet (10 mg total) by mouth daily.  Marland Kitchen EPINEPHrine (EPI-PEN) 0.3 mg/0.3 mL DEVI Inject 0.3 mg into the muscle once as needed. For severe allergic reaction  . metFORMIN (GLUCOPHAGE-XR) 500 MG 24 hr tablet Take 4 tablets (2,000 mg total) by mouth daily with breakfast.  . olmesartan (BENICAR) 20 MG tablet Take 1 tablet (20 mg total) by mouth daily.  Marland Kitchen omeprazole (PRILOSEC) 40 MG capsule Take 1 capsule (40 mg total) by mouth daily.  . polyethylene glycol powder (GLYCOLAX/MIRALAX) powder Take 8.5 g by mouth as needed.  .  triamterene-hydrochlorothiazide (MAXZIDE-25) 37.5-25 MG tablet Take 1 tablet by mouth daily.  . Vitamin Mixture (VITAMIN E COMPLETE PO) Take by mouth.   No facility-administered encounter medications on file as of 05/06/2016.     Allergies as of 05/06/2016 - Review Complete 05/06/2016  Allergen Reaction Noted  . Ace inhibitors Anaphylaxis 03/03/2008  . Shellfish allergy Anaphylaxis and Other (See Comments) 05/25/2011  . Januvia [sitagliptin] Other (See Comments) 10/05/2012  . Lipitor [atorvastatin]  02/10/2013    Past Medical History:  Diagnosis Date  . Allergy   . Asthma   . Diabetes mellitus   . Fatty liver   . Gastritis   . Gastroparesis    study 11 14  nl abd [place]  . GERD (gastroesophageal reflux disease)   . High cholesterol   . History of varicella   . Hypertension   . IBS (irritable bowel syndrome)     Past Surgical History:  Procedure Laterality Date  . ANAL RECTAL MANOMETRY N/A 04/03/2015   Procedure: ANO RECTAL MANOMETRY;  Surgeon: Mauri Pole, MD;  Location: [facility];  Service: Endoscopy;  Laterality: N/A;  . CHOLECYSTECTOMY    . WISDOM TOOTH EXTRACTION      Family History  Problem Relation Age of Onset  . Prostate cancer Father   . Diabetes Father   . Hypertension Father   . Diabetes Mother   . Heart disease Mother   . Arthritis Mother   . Hyperlipidemia Mother   . Hypertension Mother   . Parkinson's disease Mother   . Thyroid disease Mother   . Diabetes Paternal Grandmother   .  Arthritis Paternal Grandmother   . Hyperlipidemia Paternal Grandmother   . Heart disease Paternal Grandmother   . Dementia Paternal Grandmother   . Hypertension Paternal Grandmother   . Hyperlipidemia Maternal Grandmother   . Hypertension Maternal Grandmother   . Hyperlipidemia Maternal Grandfather   . Hypertension Maternal Grandfather   . Hyperlipidemia Paternal Grandfather   . Hypertension Paternal Grandfather   . Diabetes Paternal Grandfather   .  Cholelithiasis Paternal Grandfather   . Colon cancer Neg Hx   . Esophageal cancer Neg Hx   . Stomach cancer Neg Hx   . Rectal cancer Neg Hx     Social History   Social History  . Marital status: Single    Spouse name: N/A  . Number of children: N/A  . Years of education: N/A   Occupational History  . Not on file.   Social History Main Topics  . Smoking status: Never Smoker  . Smokeless tobacco: Never Used  . Alcohol use No  . Drug use: No  . Sexual activity: Not on file   Other Topics Concern  . Not on file   Social History Narrative   Usually # of hours of sleep per night: 6   3 of people living at your residence? 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   Irregular sleep 7:30 to 11 or 12 and then 4 to 6:30 before she goes to work   Has attended college.   Originally from [place]   Abnormal pap remote last 2012       Review of systems: Review of Systems  Constitutional: Negative for fever and chills. Positive for lack of energy HENT: Positive for sinus problem.   Eyes: Negative for blurred vision.  Respiratory: Negative for cough, shortness of breath and wheezing.   Cardiovascular: Negative for chest pain and palpitations.  Gastrointestinal: as per HPI Genitourinary: Negative for dysuria, urgency, frequency and hematuria.  Musculoskeletal: Positive for myalgias, back pain and joint pain.  Skin: Negative for itching and rash.  Neurological: Negative for dizziness, tremors, focal weakness, seizures and loss of consciousness.  Endo/Heme/Allergies: Positive for seasonal allergies.  Psychiatric/Behavioral: Negative for depression, suicidal ideas and hallucinations.  All other systems reviewed and are negative.   Physical Exam: Vitals:   05/06/16 0934  BP: 130/84   Body mass index is 40.46 kg/m. Gen:      No acute distress HEENT:  EOMI,  sclera anicteric Neck:     No masses; no thyromegaly Lungs:    Clear to auscultation bilaterally; normal respiratory effort CV:         Regular rate and rhythm; no murmurs Abd:      + bowel sounds; soft, non-tender; no palpable masses, no distension Ext:    No edema; adequate peripheral perfusion Skin:      Warm and dry; no rash Neuro: alert and oriented x 3 Psych: normal mood and affect  Data Reviewed:  Reviewed labs, radiology imaging, old records and pertinent past GI work up   Assessment and Plan/Recommendations:  51 year old female with history of diabetes here with complaints of intermittent nausea, alternating constipation and diarrhea  Nausea: Intermittent symptoms .Unclear etiology. Could be secondary to GERD , and also severe constipation. Patient also has history of ? Gastroparesis based on 2 hour gastric emptying study that is considered inferior and is  not well validated  PPI daily and follow antireflux measures We'll repeat gastric emptying study and request a 4 hour study.  Constipation alternating with diarrhea: Patient likely has irritable bowel syndrome We'll start low-dose linzess 72 mcg daily Stop MiraLAX Increase dietary fluid and fiber intake No evidence of dyssynergia defecation or pelvic outlet dysfunction based on anorectal manometry  25 minutes was spent face-to-face with the patient. Greater than 50% of the time used for counseling as well as treatment plan and follow-up. She had multiple questions which were answered to her satisfaction  Damaris Hippo , MD [phone] Mon-Fri 8a-5p [phone] after 5p, weekends, holidays  CC: Shawnee Knapp, MD

## 2016-05-06 NOTE — Patient Instructions (Signed)
You have been scheduled for a gastric emptying scan at [facility] on 05/09/2016 at 9:30am. Please arrive at least 15 minutes prior to your appointment for registration. Please make certain not to have anything to eat or drink after midnight the night before your test. Hold all stomach medications (ex: Zofran, phenergan, Reglan) 48 hours prior to your test. If you need to reschedule your appointment, please contact radiology scheduling at [phone]. _____________________________________________________________________ A gastric-emptying study measures how long it takes for food to move through your stomach. There are several ways to measure stomach emptying. In the most common test, you eat food that contains a small amount of radioactive material. A scanner that detects the movement of the radioactive material is placed over your abdomen to monitor the rate at which food leaves your stomach. This test normally takes about 4 hours to complete. _____________________________________________________________________   Use Linzess 72 mcg daily  Stop Miralax

## 2016-05-09 ENCOUNTER — Ambulatory Visit (HOSPITAL_COMMUNITY): Payer: Managed Care, Other (non HMO)

## 2016-05-13 ENCOUNTER — Ambulatory Visit: Payer: 59 | Admitting: Family Medicine

## 2016-05-23 ENCOUNTER — Encounter (HOSPITAL_COMMUNITY): Payer: Managed Care, Other (non HMO)

## 2016-05-24 ENCOUNTER — Ambulatory Visit (INDEPENDENT_AMBULATORY_CARE_PROVIDER_SITE_OTHER): Payer: 59 | Admitting: Podiatry

## 2016-05-24 ENCOUNTER — Encounter: Payer: Self-pay | Admitting: Podiatry

## 2016-05-24 DIAGNOSIS — L03032 Cellulitis of left toe: Secondary | ICD-10-CM | POA: Diagnosis not present

## 2016-05-24 NOTE — Patient Instructions (Signed)

## 2016-05-25 NOTE — Progress Notes (Signed)
Subjective:     Patient ID: Maria Sharp, female   DOB: Dec 28, 1965, 51 y.o.   MRN: [id number]  HPI patient points to third digit left states it's been draining and she's concerned because she's a diabetic   Review of Systems     Objective:   Physical Exam Neurovascular status intact well controlled diabetes with moderate obesity is complicating factor who presents with incurvated third nail left medial side with distal drainage but no proximal edema erythema or pathology noted    Assessment:     Paronychia infection left third toe medial side    Plan:     H&P condition reviewed and recommended removal of the corner flushed the area and sterile dressing. Patient wants procedure understanding risk and today I infiltrated 60 Milligan times like Marcaine mixture remove the medial corner flushed the area and applied sterile dressing with no indications of proximal spread. Gave instructions on soaks and patient will be seen back and is encouraged to call with any questions

## 2016-05-26 DIAGNOSIS — K760 Fatty (change of) liver, not elsewhere classified: Secondary | ICD-10-CM | POA: Insufficient documentation

## 2016-05-26 NOTE — Progress Notes (Signed)
Subjective:    Patient ID: Maria Sharp, female    DOB: 07/26/1965, 51 y.o.   MRN: [id number]  Chief Complaint  Patient presents with  . Follow-up    6 MONTH  . Arm Pain    LEFT  . Ingrown Toenail    LEFT  . Medication Problem    METFORMIN, NEW GLUCOSE MEDICATION  . Ear Drainage     Pt is due for a recheck on her chronic medical conditions - last visit for this was 8 mos prior. Last CPE here was 09/2014 (asked for one at her last visit but we were unable to do due to more pressing need of eval of chronic medical conditions)   HPI  Maria ESPE is a 51 y.o. female who presents to Primary Care at [facility] for    Chronic Medical Conditions:  DM: hemoglobin a1c prior was 6.4->7.0. Pt does not check her blood sugar?? (given rx for glucometer at her last visit). On metformin but dose (and compliance) limited by GI side effects - tried to increase dose from 1553m xr to 20068mxr qd at last visit.  Nml microalb and foot exam done 10/03/2015. Pt follows with podiatry Dr. RePaulla Dollynd ophtho Dr. GoDelman Cheadleegularly - no DM retinopathy on 05/0`/10`7.  Asa?  Had mild pancreatitis with Januvia.  Did not get her glucometer. 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 Difficult as she has had to increase in providing assitance and care to her mother. HTN:  On triamterene-hctz 37.5-25 qd and olmesartan 20. Had angioedema w/ ace. HLD: Close to goal with LDL 103 and non-HDL 129 on last lipid panel 8 mos prior - not on medication.  Has lipitor "allergy" listed. Was on pravastatin 20 but discontinued over a yr ago due to years of poor compliance - would just forget and concerned about side effects. Fatty Liver: mild hepatomegaly seen on CT 2010, Abd [place] showed focal fatty infiltration int he liver around the gallbladder fossa but this had resolved s/p cholecystectomy on [place] yr later. IBS-D/C, Gastroparesis  (?), GERD: Followed by Dr. NaSilverio Decampt [place] prilosec 40 qd; recently started on linzess 7246mqd and taken off miralax. Instructed to increase fluid and fiber in diet. Pt has intermittently nausea attributed to the gastroparesis but Dr. NanSilverio Decampted that pt did NOT have sig sxs, has not needed any motility agents (pt has repeatedly refused to try due to potential side effects), and had borderline results on 2 hr gastric emptying study so has recommended pt proceed with a 4 hour gastric emptying study. Normal colonoscopy in 2009. Has seen nutrition prior for help. Vit D def: 8 mos prior improved to 28 so pt has been taking vitamin D sporadically - she thinks around 5000u.Gets plenty of cheese and milk Arthralgias: On celebrex 100 bid prn from Dr. DeaMarlou Sar old tear of lateral meniscus in Rt knee which has responded well to injections prior. Taking rarely. Anaphylactic shellfish allergy: keeps epi-pen Sleep: Pt mentions she sleeps for about 5 hours. Pt often feels fatigue. Pt has times to sleep on her off days, but doesn't want to. Pt naps during the day. Pt states she wakes up about 2 times to use the bathroom while she sleeps. This wakes her up and she'll watch Tv for 2 hours. Pt drinks a lot of water while at work.  H/o allergies/asthma: Seen  by allergist Dr. Donneta Romberg prior as h/o recurrent sinus infections. On xyzal, singulair, and allergy shots. Had right ear pain and right eye tearing last week but improved with allergy meds.  Primary Preventative Screens: Cervical CA Screen: PMHx of abnormal pap around 2000 by Dr. Stann Mainland at [facility] 06/20/15 that was nl. Followed by gynecologist Dr. Harrington Challenger on OCPs. Breast CA Screening: Mammogram breast center. Last mammogram 3/14 /17 right breast had 2 asymmetries recommended additional imaging which was done and was found begning. Recommended repeat mamogram in 1 year. Pt reports there was a cyst found in her right breast. Negative HCV and HIV screening  in 2016 Diet and Exercise: Pt states her dit has been bad and she hasn't been exercising. She walked for 11 mins yesterday. GI: [facility] where she follows reg due to chronic conditions. Cardio: EKG done 12/06/13 with RBBB. Last seen by Dr. Percival Spanish 11/2015. POET 04/2014 was negative/normal. Pt advised no further work was needed unless sxs recur. Podiatry: Pt has been followed by podiatry Dr. Prudence Davidson OTC meds/vit/Supp: Not on calcium supp  Past Medical History:  Diagnosis Date  . Allergy   . Asthma   . Diabetes mellitus   . Fatty liver   . Gastritis   . Gastroparesis    study 11 14  nl abd [place]  . GERD (gastroesophageal reflux disease)   . High cholesterol   . History of varicella   . Hypertension   . IBS (irritable bowel syndrome)    Past Surgical History:  Procedure Laterality Date  . ANAL RECTAL MANOMETRY N/A 04/03/2015   Procedure: ANO RECTAL MANOMETRY;  Surgeon: Mauri Pole, MD;  Location: [facility];  Service: Endoscopy;  Laterality: N/A;  . CHOLECYSTECTOMY    . WISDOM TOOTH EXTRACTION     Current Outpatient Prescriptions on File Prior to Visit  Medication Sig Dispense Refill  . celecoxib (CELEBREX) 100 MG capsule Take 1 capsule (100 mg total) by mouth 2 (two) times daily. (Patient taking differently: Take 100 mg by mouth as needed. ) 60 capsule 1  . EPINEPHrine (EPI-PEN) 0.3 mg/0.3 mL DEVI Inject 0.3 mg into the muscle once as needed. For severe allergic reaction    . metFORMIN (GLUCOPHAGE-XR) 500 MG 24 hr tablet Take 4 tablets (2,000 mg total) by mouth daily with breakfast. 360 tablet 3  . olmesartan (BENICAR) 20 MG tablet Take 1 tablet (20 mg total) by mouth daily. 90 tablet 3  . polyethylene glycol powder (GLYCOLAX/MIRALAX) powder Take 8.5 g by mouth as needed. 255 g 3  . triamterene-hydrochlorothiazide (MAXZIDE-25) 37.5-25 MG tablet Take 1 tablet by mouth daily. 90 tablet 3  . Vitamin Mixture (VITAMIN E COMPLETE PO) Take by mouth.    . [DISCONTINUED] albuterol  (PROVENTIL HFA;VENTOLIN HFA) 108 (90 BASE) MCG/ACT inhaler Inhale 2 puffs into the lungs every 4 (four) hours as needed. 6.7 g 0  . [DISCONTINUED] Azelastine-Fluticasone (DYMISTA) 137-50 MCG/ACT SUSP Place 1 spray into the nose daily. 23 g 0  . [DISCONTINUED] montelukast (SINGULAIR) 10 MG tablet     . [DISCONTINUED] pravastatin (PRAVACHOL) 20 MG tablet Take 1 tablet (20 mg total) by mouth daily. 90 tablet 2   No current facility-administered medications on file prior to visit.    Allergies  Allergen Reactions  . Ace Inhibitors Anaphylaxis  . Shellfish Allergy Anaphylaxis and Other (See Comments)    Only shrimp  . Januvia [Sitagliptin] Other (See Comments)    Recurrent abdominal pain with elevated pancreas enzymes  . Lipitor [  Atorvastatin]    Family History  Problem Relation Age of Onset  . Prostate cancer Father   . Diabetes Father   . Hypertension Father   . Diabetes Mother   . Heart disease Mother   . Arthritis Mother   . Hyperlipidemia Mother   . Hypertension Mother   . Parkinson's disease Mother   . Thyroid disease Mother   . Diabetes Paternal Grandmother   . Arthritis Paternal Grandmother   . Hyperlipidemia Paternal Grandmother   . Heart disease Paternal Grandmother   . Dementia Paternal Grandmother   . Hypertension Paternal Grandmother   . Hyperlipidemia Maternal Grandmother   . Hypertension Maternal Grandmother   . Hyperlipidemia Maternal Grandfather   . Hypertension Maternal Grandfather   . Hyperlipidemia Paternal Grandfather   . Hypertension Paternal Grandfather   . Diabetes Paternal Grandfather   . Cholelithiasis Paternal Grandfather   . Colon cancer Neg Hx   . Esophageal cancer Neg Hx   . Stomach cancer Neg Hx   . Rectal cancer Neg Hx    Social History   Social History  . Marital status: Single    Spouse name: N/A  . Number of children: N/A  . Years of education: N/A   Social History Main Topics  . Smoking status: Never Smoker  . Smokeless tobacco:  Never Used  . Alcohol use No  . Drug use: No  . Sexual activity: Not Asked   Other Topics Concern  . None   Social History Narrative   Usually # of hours of sleep per night: 6   3 of people living at your residence? 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   Irregular sleep 7:30 to 11 or 12 and then 4 to 6:30 before she goes to work   Has attended college.   Originally from [place]   Abnormal pap remote last 2012    Depression screen [facility] 2/9 05/27/2016 10/03/2015 09/22/2014 06/14/2013  Decreased Interest 0 0 0 0  Down, Depressed, Hopeless 0 0 0 0  PHQ - 2 Score 0 0 0 0    Review of Systems See hpi    Objective:   Physical Exam  Constitutional: She is oriented to person, place, and time. She appears well-developed and well-nourished. No distress.  HENT:  Head: Normocephalic and atraumatic.  Right Ear: External ear normal.  Left Ear: External ear normal.  Eyes: Conjunctivae are normal. No scleral icterus.  Neck: Normal range of motion. Neck supple. No thyromegaly present.  Cardiovascular: Normal rate, regular rhythm, normal heart sounds and intact distal pulses.   Pulmonary/Chest: Effort normal and breath sounds normal. No respiratory distress.  Musculoskeletal: She exhibits no edema.  Lymphadenopathy:    She has no cervical adenopathy.  Neurological: She is alert and oriented to person, place, and time.  Skin: Skin is warm and dry. She is not diaphoretic. No erythema.  Psychiatric: She has a normal mood and affect. Her behavior is normal.    BP 126/82 (BP Location: Right Arm, Patient Position: Sitting, Cuff Size: Large)   Pulse 97   Temp 98.3 F (36.8 C) (Oral)   Resp 18   Ht _0  (1.778 m)   LMP 05/13/2016   SpO2 97%      Diabetic Foot Exam - Simple   Simple Foot Form Diabetic Foot exam was performed  with the following findings:  Yes 05/27/2016  10:22 AM  Visual Inspection No deformities, no ulcerations, no other skin breakdown bilaterally:  Yes Sensation Testing Intact to touch and monofilament testing bilaterally:  Yes Pulse Check Posterior Tibialis and Dorsalis pulse intact bilaterally:  Yes Comments     Assessment & Plan:  a1c Flu shot She will need a refill of her bp and metformin prior to her next visit in 6 mos Encouraged her to take celebrex more prn  1. Type 2 diabetes mellitus with peripheral circulatory disorder (HCC)   2. Obesity (BMI 30-39.9)   3. Essential hypertension   4. Hyperlipidemia LDL goal <100   5. Vitamin D deficiency   6. Irritable bowel syndrome with both constipation and diarrhea   7. Gastroesophageal reflux disease, esophagitis presence not specified   8. Fatty liver   9. Chronic sinusitis, unspecified location     Orders Placed This Encounter  Procedures  . Comprehensive metabolic panel    Order Specific Question:   Has the patient fasted?    Answer:   Yes  . Lipid panel    Order Specific Question:   Has the patient fasted?    Answer:   Yes  . VITAMIN D 25 Hydroxy (Vit-D Deficiency, Fractures)  . POCT glycosylated hemoglobin (Hb A1C)  . HM DIABETES FOOT EXAM    Meds ordered this encounter  Medications  . blood glucose meter kit and supplies KIT    Sig: Dispense based on patient and insurance preference. Use up to four times daily as directed. (FOR ICD-9 250.00, 250.01).    Dispense:  1 each    Refill:  0    Order Specific Question:   Number of strips    Answer:   1000    Order Specific Question:   Number of lancets    Answer:   1000  . omeprazole (PRILOSEC) 40 MG capsule    Sig: Take 1 capsule (40 mg total) by mouth daily.    Dispense:  90 capsule    Refill:  3    [name], M.D.  Primary Care at [facility] [address] [place], [place] [address] [phone] phone [phone] fax  06/14/16 11:28 PM  Results for orders placed or performed in visit on  05/27/16  Comprehensive metabolic panel  Result Value Ref Range   Glucose 127 (H) 65 - 99 mg/dL   BUN 13 6 - 24 mg/dL   Creatinine, Ser 0.68 0.57 - 1.00 mg/dL   GFR calc non Af Amer 102 >59 mL/min/1.73   GFR calc Af Amer 118 >59 mL/min/1.73   BUN/Creatinine Ratio 19 9 - 23   Sodium 137 134 - 144 mmol/L   Potassium 4.2 3.5 - 5.2 mmol/L   Chloride 95 (L) 96 - 106 mmol/L   CO2 26 18 - 29 mmol/L   Calcium 9.8 8.7 - 10.2 mg/dL   Total Protein 7.4 6.0 - 8.5 g/dL   Albumin 4.3 3.5 - 5.5 g/dL   Globulin, Total 3.1 1.5 - 4.5 g/dL   Albumin/Globulin Ratio 1.4 1.2 - 2.2   Bilirubin Total 0.3 0.0 - 1.2 mg/dL   Alkaline Phosphatase 56 39 - 117 IU/L   AST 15 0 - 40 IU/L   ALT 17 0 - 32 IU/L  Lipid panel  Result Value Ref Range   Cholesterol, Total 212 (H) 100 - 199 mg/dL   Triglycerides 168 (H) 0 - 149 mg/dL   HDL 50 >39 mg/dL  VLDL Cholesterol Cal 34 5 - 40 mg/dL   LDL Calculated 128 (H) 0 - 99 mg/dL   Chol/HDL Ratio 4.2 0.0 - 4.4 ratio units  VITAMIN D 25 Hydroxy (Vit-D Deficiency, Fractures)  Result Value Ref Range   Vit D, 25-Hydroxy 26.1 (L) 30.0 - 100.0 ng/mL  POCT glycosylated hemoglobin (Hb A1C)  Result Value Ref Range   Hemoglobin A1C 6.9

## 2016-05-27 ENCOUNTER — Ambulatory Visit (INDEPENDENT_AMBULATORY_CARE_PROVIDER_SITE_OTHER): Payer: Managed Care, Other (non HMO) | Admitting: Family Medicine

## 2016-05-27 ENCOUNTER — Encounter: Payer: Self-pay | Admitting: Family Medicine

## 2016-05-27 ENCOUNTER — Encounter: Payer: Self-pay | Admitting: Podiatry

## 2016-05-27 VITALS — BP 126/82 | HR 97 | Temp 98.3°F | Resp 18 | Ht 70.0 in

## 2016-05-27 DIAGNOSIS — I10 Essential (primary) hypertension: Secondary | ICD-10-CM

## 2016-05-27 DIAGNOSIS — E669 Obesity, unspecified: Secondary | ICD-10-CM

## 2016-05-27 DIAGNOSIS — K219 Gastro-esophageal reflux disease without esophagitis: Secondary | ICD-10-CM | POA: Diagnosis not present

## 2016-05-27 DIAGNOSIS — E559 Vitamin D deficiency, unspecified: Secondary | ICD-10-CM | POA: Diagnosis not present

## 2016-05-27 DIAGNOSIS — E785 Hyperlipidemia, unspecified: Secondary | ICD-10-CM | POA: Diagnosis not present

## 2016-05-27 DIAGNOSIS — E1151 Type 2 diabetes mellitus with diabetic peripheral angiopathy without gangrene: Secondary | ICD-10-CM | POA: Diagnosis not present

## 2016-05-27 DIAGNOSIS — K760 Fatty (change of) liver, not elsewhere classified: Secondary | ICD-10-CM

## 2016-05-27 DIAGNOSIS — K582 Mixed irritable bowel syndrome: Secondary | ICD-10-CM

## 2016-05-27 DIAGNOSIS — J329 Chronic sinusitis, unspecified: Secondary | ICD-10-CM

## 2016-05-27 LAB — POCT GLYCOSYLATED HEMOGLOBIN (HGB A1C): Hemoglobin A1C: 6.9

## 2016-05-27 MED ORDER — OMEPRAZOLE 40 MG PO CPDR: 40.0000 mg | DELAYED_RELEASE_CAPSULE | Freq: Every day | ORAL | 3 refills | Status: DC

## 2016-05-27 MED ORDER — BLOOD GLUCOSE MONITOR KIT: PACK | 0 refills | Status: DC

## 2016-05-27 NOTE — Patient Instructions (Addendum)
After work, apply heat to your upper back for 20 minutes. Followed this by 5 minutes of compression using a tennis ball and stretches. At [employer] or any store that sells exercise items in the yoga section you can find a foam roller. Laying on this after 20 minutes of heat will do decompression and the stretching at once and is almost always a good investment.  Don't hesitate to use the Celebrex more often. It will not be hard at all on her liver and your kidneys are fine. Taking 1 Celebrex together with one Tylenol has been well shown to be as effective as a pain pill and much safer so this is a fine thing to do a couple times a week.    IF you received an x-ray today, you will receive an invoice from [facility]. Please contact [facility] at [phone] with questions or concerns regarding your invoice.   IF you received labwork today, you will receive an invoice from [facility]. Please contact [facility] at [phone] with questions or concerns regarding your invoice.   Our billing staff will not be able to assist you with questions regarding bills from these companies.  You will be contacted with the lab results as soon as they are available. The fastest way to get your results is to activate your My Chart account. Instructions are located on the last page of this paperwork. If you have not heard from [place] regarding the results in 2 weeks, please contact this office.     Blood Glucose Monitoring, Adult Monitoring your blood sugar (glucose) helps you manage your diabetes. It also helps you and your health care provider determine how well your diabetes management plan is working. Blood glucose monitoring involves checking your blood glucose as often as directed, and keeping a record (log) of your results over time. Why should I monitor my blood glucose? Checking your blood glucose regularly can:  Help you understand how food, exercise, illnesses, and medicines affect your blood  glucose.  Let you know what your blood glucose is at any time. You can quickly tell if you are having low blood glucose (hypoglycemia) or high blood glucose (hyperglycemia).  Help you and your health care provider adjust your medicines as needed. When should I check my blood glucose? Follow instructions from your health care provider about how often to check your blood glucose. This may depend on:  The type of diabetes you have.  How well-controlled your diabetes is.  Medicines you are taking. If you have type 1 diabetes:  Check your blood glucose at least 2 times a day.  Also check your blood glucose:  Before every insulin injection.  Before and after exercise.  Between meals.  2 hours after a meal.  Occasionally between 2:00 a.m. and 3:00 a.m., as directed.  Before potentially dangerous tasks, like driving or using heavy machinery.  At bedtime.  You may need to check your blood glucose more often, up to 6-10 times a day:  If you use an insulin pump.  If you need multiple daily injections (MDI).  If your diabetes is not well-controlled.  If you are ill.  If you have a history of severe hypoglycemia.  If you have a history of not knowing when your blood glucose is getting low (hypoglycemia unawareness). If you have type 2 diabetes:  If you take insulin or other diabetes medicines, check your blood glucose at least 2 times a day.  If you are on intensive insulin therapy, check your  blood glucose at least 4 times a day. Occasionally, you may also need to check between 2:00 a.m. and 3:00 a.m., as directed.  Also check your blood glucose:  Before and after exercise.  Before potentially dangerous tasks, like driving or using heavy machinery.  You may need to check your blood glucose more often if:  Your medicine is being adjusted.  Your diabetes is not well-controlled.  You are ill. What is a blood glucose log?  A blood glucose log is a record of your  blood glucose readings. It helps you and your health care provider:  Look for patterns in your blood glucose over time.  Adjust your diabetes management plan as needed.  Every time you check your blood glucose, write down your result and notes about things that may be affecting your blood glucose, such as your diet and exercise for the day.  Most glucose meters store a record of glucose readings in the meter. Some meters allow you to download your records to a computer. How do I check my blood glucose? Follow these steps to get accurate readings of your blood glucose: Supplies needed   Blood glucose meter.  Test strips for your meter. Each meter has its own strips. You must use the strips that come with your meter.  A needle to prick your finger (lancet). Do not use lancets more than once.  A device that holds the lancet (lancing device).  A journal or log book to write down your results. Procedure  Wash your hands with soap and water.  Prick the side of your finger (not the tip) with the lancet. Use a different finger each time.  Gently rub the finger until a small drop of blood appears.  Follow instructions that come with your meter for inserting the test strip, applying blood to the strip, and using your blood glucose meter.  Write down your result and any notes. Alternative testing sites  Some meters allow you to use areas of your body other than your finger (alternative sites) to test your blood.  If you think you may have hypoglycemia, or if you have hypoglycemia unawareness, do not use alternative sites. Use your finger instead.  Alternative sites may not be as accurate as the fingers, because blood flow is slower in these areas. This means that the result you get may be delayed, and it may be different from the result that you would get from your finger.  The most common alternative sites are:  Forearm.  Thigh.  Palm of the hand. Additional tips  Always keep  your supplies with you.  If you have questions or need help, all blood glucose meters have a 24-hour "hotline" number that you can call. You may also contact your health care provider.  After you use a few boxes of test strips, adjust (calibrate) your blood glucose meter by following instructions that came with your meter. This information is not intended to replace advice given to you by your health care provider. Make sure you discuss any questions you have with your health care provider. Document Released: 04/11/2003 Document Revised: 10/27/2015 Document Reviewed: 09/18/2015 Elsevier Interactive Patient Education  2017 [employer].  How to Avoid Diabetes Mellitus Problems You can take action to prevent or slow down problems that are caused by diabetes (diabetes mellitus). Following your diabetes plan and taking care of yourself can reduce your risk of serious or life-threatening complications. Manage your diabetes  Follow instructions from your health care providers about managing  your diabetes. Your diabetes may be managed by a team of health care providers who can teach you how to care for yourself and can answer questions that you have.  Educate yourself about your condition so you can make healthy choices about eating and physical activity.  Check your blood sugar (glucose) levels as often as directed. Your health care provider will help you decide how often to check your blood glucose level depending on your treatment goals and how well you are meeting them.  Ask your health care provider if you should take low-dose aspirin daily and what dose is recommended for you. Taking low-dose aspirin daily is recommended to help prevent cardiovascular disease. Do not use nicotine or tobacco Do not use any products that contain nicotine or tobacco, such as cigarettes and e-cigarettes. If you need help quitting, ask your health care provider. Nicotine raises your risk for diabetes problems. If you  quit using nicotine:  You will lower your risk for heart attack, stroke, nerve disease, and kidney disease.  Your cholesterol and blood pressure may improve.  Your blood circulation will improve. Keep your blood pressure under control To control your blood pressure:  Follow instructions from your health care provider about meal planning, exercise, and medicines.  Make sure your health care provider checks your blood pressure at every medical visit. A blood pressure reading consists of two numbers. Generally, the goal is to keep your top number (systolic pressure) at or below 140, and your bottom number (diastolic pressure) at or below 90. Your health care provider may recommend a lower target blood pressure. Your individualized target blood pressure is determined based on:  Your age.  Your medicines.  How long you have had diabetes.  Any other medical conditions you have. Keep your cholesterol under control To control your cholesterol:  Follow instructions from your health care provider about meal planning, exercise, and medicines.  Have your cholesterol checked at least once a year.  You may be prescribed medicine to lower cholesterol (statin). If you are not taking a statin, ask your health care provider if you should be. Controlling your cholesterol may:  Help prevent heart disease and stroke. These are the most common health problems for people with diabetes.  Improve your blood flow. Schedule and keep yearly physical exams and eye exams Your health care provider will tell you how often you need medical visits depending on your diabetes management plan. Keep all follow-up visits as directed. This is important so possible problems can be identified early and complications can be avoided or treated.  Every visit with your health care provider should include measuring your:  Weight.  Blood pressure.  Blood glucose control.  Your A1c (hemoglobin A1c) level should be  checked:  At least 2 times a year, if you are meeting your treatment goals.  4 times a year, if you are not meeting treatment goals or if your treatment goals have changed.  Your blood lipids (lipid profile) should be checked yearly. You should also be checked yearly for protein in your urine (urine microalbumin).  If you have type 1 diabetes, get an eye exam 3-5 years after you are diagnosed, and then once a year after your first exam.  If you have type 2 diabetes, get an eye exam as soon as you are diagnosed, and then once a year after your first exam. Keep your vaccines current It is recommended that you receive:  A flu (influenza) vaccine every year.  A pneumonia (pneumococcal)  vaccine and a hepatitis B vaccine. If you are age 7 or older, you may get the pneumonia vaccine as a series of two separate shots. Ask your health care provider which other vaccines may be recommended. Take care of your feet Diabetes may cause you to have poor blood circulation to your legs and feet. Because of this, taking care of your feet is very important. Diabetes can cause:  The skin on the feet to get thinner, break more easily, and heal more slowly.  Nerve damage in your legs and feet, which results in decreased feeling. You may not notice minor injuries that could lead to serious problems. To avoid foot problems:  Check your skin and feet every day for cuts, bruises, redness, blisters, or sores.  Schedule a foot exam with your health care provider once every year. This exam includes:  Inspecting of the structure and skin of your feet.  Checking the pulses and sensation in your feet.  Make sure that your health care provider performs a visual foot exam at every medical visit. Take care of your teeth People with poorly controlled diabetes are more likely to have gum (periodontal) disease. Diabetes can make periodontal diseases harder to control. If not treated, periodontal diseases can lead to  tooth loss. To prevent this:  Brush your teeth twice a day.  Floss at least once a day.  Visit your dentist 2 times a year. Drink responsibly Limit alcohol intake to no more than 1 drink a day for nonpregnant women and 2 drinks a day for men. One drink equals 12 oz of beer, 5 oz of wine, or 1 oz of hard liquor. It is important to eat food when you drink alcohol to avoid low blood glucose (hypoglycemia). Avoid alcohol if you:  Have a history of alcohol abuse or dependence.  Are pregnant.  Have liver disease, pancreatitis, advanced neuropathy, or severe hypertriglyceridemia. Lessen stress Living with diabetes can be stressful. When you are experiencing stress, your blood glucose may be affected in two ways:  Stress hormones may cause your blood glucose to rise.  You may be distracted from taking good care of yourself. Be aware of your stress level and make changes to help you manage challenging situations. To lower your stress levels:  Consider joining a support group.  Do planned relaxation or meditation.  Do a hobby that you enjoy.  Maintain healthy relationships.  Exercise regularly.  Work with your health care provider or a mental health professional. Summary  You can take action to prevent or slow down problems that are caused by diabetes (diabetes mellitus). Following your diabetes plan and taking care of yourself can reduce your risk of serious or life-threatening complications.  Follow instructions from your health care providers about managing your diabetes. Your diabetes may be managed by a team of health care providers who can teach you how to care for yourself and can answer questions that you have.  Your health care provider will tell you how often you need medical visits depending on your diabetes management plan. Keep all follow-up visits as directed. This is important so possible problems can be identified early and complications can be avoided or treated. This  information is not intended to replace advice given to you by your health care provider. Make sure you discuss any questions you have with your health care provider. Document Released: 12/25/2010 Document Revised: 01/06/2016 Document Reviewed: 01/06/2016 Elsevier Interactive Patient Education  2017 [employer].

## 2016-05-28 LAB — COMPREHENSIVE METABOLIC PANEL
ALT: 17 IU/L (ref 0–32)
AST: 15 IU/L (ref 0–40)
Albumin/Globulin Ratio: 1.4 (ref 1.2–2.2)
Albumin: 4.3 g/dL (ref 3.5–5.5)
Alkaline Phosphatase: 56 IU/L (ref 39–117)
BUN/Creatinine Ratio: 19 (ref 9–23)
BUN: 13 mg/dL (ref 6–24)
Bilirubin Total: 0.3 mg/dL (ref 0.0–1.2)
CO2: 26 mmol/L (ref 18–29)
Calcium: 9.8 mg/dL (ref 8.7–10.2)
Chloride: 95 mmol/L — ABNORMAL LOW (ref 96–106)
Creatinine, Ser: 0.68 mg/dL (ref 0.57–1.00)
GFR calc Af Amer: 118 mL/min/{1.73_m2} (ref >59)
GFR calc non Af Amer: 102 mL/min/{1.73_m2} (ref >59)
Globulin, Total: 3.1 g/dL (ref 1.5–4.5)
Glucose: 127 mg/dL — ABNORMAL HIGH (ref 65–99)
Potassium: 4.2 mmol/L (ref 3.5–5.2)
Sodium: 137 mmol/L (ref 134–144)
Total Protein: 7.4 g/dL (ref 6.0–8.5)

## 2016-05-28 LAB — VITAMIN D 25 HYDROXY (VIT D DEFICIENCY, FRACTURES): Vit D, 25-Hydroxy: 26.1 ng/mL — ABNORMAL LOW (ref 30.0–100.0)

## 2016-05-28 LAB — LIPID PANEL
Chol/HDL Ratio: 4.2 ratio units (ref 0.0–4.4)
Cholesterol, Total: 212 mg/dL — ABNORMAL HIGH (ref 100–199)
HDL: 50 mg/dL (ref >39)
LDL Calculated: 128 mg/dL — ABNORMAL HIGH (ref 0–99)
Triglycerides: 168 mg/dL — ABNORMAL HIGH (ref 0–149)
VLDL Cholesterol Cal: 34 mg/dL (ref 5–40)

## 2016-06-03 ENCOUNTER — Ambulatory Visit (INDEPENDENT_AMBULATORY_CARE_PROVIDER_SITE_OTHER): Payer: Managed Care, Other (non HMO)

## 2016-06-03 ENCOUNTER — Encounter: Payer: Self-pay | Admitting: Podiatry

## 2016-06-03 ENCOUNTER — Ambulatory Visit (INDEPENDENT_AMBULATORY_CARE_PROVIDER_SITE_OTHER): Payer: Managed Care, Other (non HMO) | Admitting: Podiatry

## 2016-06-03 DIAGNOSIS — M79675 Pain in left toe(s): Secondary | ICD-10-CM

## 2016-06-03 DIAGNOSIS — L03032 Cellulitis of left toe: Secondary | ICD-10-CM

## 2016-06-04 NOTE — Progress Notes (Signed)
Subjective:     Patient ID: Maria Sharp, female   DOB: 1966-04-11, 51 y.o.   MRN: [id number]  HPI patient states she's concerned about the middle toe on her left foot because of discoloration and she had had previous infection   Review of Systems     Objective:   Physical Exam Neurovascular status intact muscle strength adequate range of motion within normal limits with patient found to have irritated third digit left more proximal that's localized in nature with no proximal edema erythema drainage noted    Assessment:     Probable irritation from dressing or possible allergy to adhesive    Plan:     Advised on soaks and that this should resolve uneventfully and if any proximal edema or drainage were to occur she is to reappoint immediately

## 2016-06-13 ENCOUNTER — Encounter (HOSPITAL_COMMUNITY): Payer: Managed Care, Other (non HMO)

## 2016-07-01 ENCOUNTER — Ambulatory Visit (INDEPENDENT_AMBULATORY_CARE_PROVIDER_SITE_OTHER): Payer: Managed Care, Other (non HMO) | Admitting: Podiatry

## 2016-07-01 DIAGNOSIS — B351 Tinea unguium: Secondary | ICD-10-CM | POA: Diagnosis not present

## 2016-07-01 DIAGNOSIS — M722 Plantar fascial fibromatosis: Secondary | ICD-10-CM

## 2016-07-01 DIAGNOSIS — M79609 Pain in unspecified limb: Secondary | ICD-10-CM | POA: Diagnosis not present

## 2016-07-01 NOTE — Progress Notes (Signed)
Subjective:     Patient ID: Maria Sharp, female   DOB: [birth year], 52 y.o.   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 I then debrided nailbeds 1-5 both feet with no iatrogenic bleeding noted

## 2016-08-08 ENCOUNTER — Telehealth: Payer: Self-pay | Admitting: Podiatry

## 2016-08-08 NOTE — Telephone Encounter (Signed)
Left message for patient to schedule an appointment to pick up orthotics. °

## 2016-09-21 ENCOUNTER — Other Ambulatory Visit (HOSPITAL_BASED_OUTPATIENT_CLINIC_OR_DEPARTMENT_OTHER): Payer: Self-pay | Admitting: Student

## 2016-09-21 ENCOUNTER — Ambulatory Visit (HOSPITAL_COMMUNITY)
Admit: 2016-09-21 | Payer: Managed Care, Other (non HMO) | Source: Other Acute Inpatient Hospital | Admitting: Emergency Medicine

## 2016-09-21 ENCOUNTER — Other Ambulatory Visit (HOSPITAL_COMMUNITY): Payer: Self-pay | Admitting: Student

## 2016-09-21 ENCOUNTER — Ambulatory Visit (HOSPITAL_COMMUNITY): Admission: RE | Admit: 2016-09-21 | Payer: Managed Care, Other (non HMO) | Source: Ambulatory Visit

## 2016-09-21 ENCOUNTER — Ambulatory Visit (HOSPITAL_BASED_OUTPATIENT_CLINIC_OR_DEPARTMENT_OTHER)
Admission: RE | Admit: 2016-09-21 | Discharge: 2016-09-21 | Disposition: A | Discharge status: Home / Self Care | Payer: Managed Care, Other (non HMO) | Source: Ambulatory Visit | Attending: Student | Admitting: Student

## 2016-09-21 DIAGNOSIS — R51 Headache: Principal | ICD-10-CM

## 2016-09-21 DIAGNOSIS — R519 Headache, unspecified: Secondary | ICD-10-CM

## 2016-09-21 DIAGNOSIS — R52 Pain, unspecified: Secondary | ICD-10-CM

## 2016-09-24 ENCOUNTER — Other Ambulatory Visit: Payer: Self-pay | Admitting: Family Medicine

## 2016-09-26 ENCOUNTER — Telehealth: Payer: Self-pay

## 2016-09-26 ENCOUNTER — Ambulatory Visit (INDEPENDENT_AMBULATORY_CARE_PROVIDER_SITE_OTHER): Payer: Managed Care, Other (non HMO) | Admitting: Family Medicine

## 2016-09-26 ENCOUNTER — Encounter: Payer: Self-pay | Admitting: Family Medicine

## 2016-09-26 VITALS — BP 123/80 | HR 82 | Temp 97.7°F | Resp 18 | Ht 70.0 in | Wt 279.2 lb

## 2016-09-26 DIAGNOSIS — R42 Dizziness and giddiness: Secondary | ICD-10-CM

## 2016-09-26 DIAGNOSIS — I10 Essential (primary) hypertension: Secondary | ICD-10-CM

## 2016-09-26 DIAGNOSIS — J0111 Acute recurrent frontal sinusitis: Secondary | ICD-10-CM | POA: Diagnosis not present

## 2016-09-26 DIAGNOSIS — E1151 Type 2 diabetes mellitus with diabetic peripheral angiopathy without gangrene: Secondary | ICD-10-CM

## 2016-09-26 MED ORDER — BLOOD GLUCOSE MONITOR KIT: PACK | 0 refills | Status: DC

## 2016-09-26 MED ORDER — TRIAMTERENE-HCTZ 37.5-25 MG PO TABS: 1.0000 | ORAL_TABLET | Freq: Every day | ORAL | 3 refills | Status: DC

## 2016-09-26 MED ORDER — METFORMIN HCL ER 500 MG PO TB24: 2000.0000 mg | ORAL_TABLET | Freq: Every day | ORAL | 3 refills | Status: DC

## 2016-09-26 MED ORDER — OLMESARTAN MEDOXOMIL 20 MG PO TABS: 20.0000 mg | ORAL_TABLET | Freq: Every day | ORAL | 3 refills | Status: DC

## 2016-09-26 MED ORDER — CLARITHROMYCIN ER 500 MG PO TB24
1000.0000 mg | ORAL_TABLET | Freq: Every day | ORAL | 0 refills | Status: DC
Start: 2016-09-26 — End: 2016-11-16

## 2016-09-26 MED ORDER — PREDNISONE 20 MG PO TABS: ORAL_TABLET | ORAL | 0 refills | Status: DC

## 2016-09-26 NOTE — Telephone Encounter (Signed)
Order for glucose meter and supplies faxed to pharmacy. Confirmation fax received.

## 2016-09-26 NOTE — Patient Instructions (Addendum)
I sent in 10d of the antibiotic but if you are feeling great on Monday you can stop it after 7d (next Wed). If your sugars spike, ok to taper off the prednisone sooner.     IF you received an x-ray today, you will receive an invoice from [facility]. Please contact [facility] at [phone] with questions or concerns regarding your invoice.   IF you received labwork today, you will receive an invoice from [facility]. Please contact [facility] at [phone] with questions or concerns regarding your invoice.   Our billing staff will not be able to assist you with questions regarding bills from these companies.  You will be contacted with the lab results as soon as they are available. The fastest way to get your results is to activate your My Chart account. Instructions are located on the last page of this paperwork. If you have not heard from [place] regarding the results in 2 weeks, please contact this office.    Sinus Headache A sinus headache occurs when the paranasal sinuses become clogged or swollen. Paranasal sinuses are air pockets within the bones of the face. Sinus headaches can range from mild to severe. What are the causes? A sinus headache can result from various conditions that affect the sinuses, such as:  Colds.  Sinus infections.  Allergies.  What are the signs or symptoms? The main symptom of this condition is a headache that may feel like pain or pressure in the face, forehead, ears, or upper teeth. People who have a sinus headache often have other symptoms, such as:  Congested or runny nose.  Fever.  Inability to smell.  Weather changes can make symptoms worse. How is this diagnosed? This condition may be diagnosed based on:  A physical exam and medical history.  Imaging tests, such as a CT scan and MRI, to check for problems with the sinuses.  A specialist may look into the sinuses with a tool that has a camera (endoscopy).  How is this  treated? Treatment for this condition depends on the cause.  Sinus pain that is caused by a sinus infection may be treated with antibiotic medicine.  Sinus pain that is caused by allergies may be helped by allergy medicines (antihistamines) and medicated nasal sprays.  Sinus pain that is caused by congestion may be helped by flushing the nose and sinuses with saline solution.  Follow these instructions at home:  Take medicines only as directed by your health care provider.  If you were prescribed an antibiotic medicine, finish all of it even if you start to feel better.  If you have congestion, use a nasal spray to help reduce pressure.  If directed, apply a warm, moist washcloth to your face to help relieve pain. Contact a health care provider if:  You have headaches more than one time each week.  You have sensitivity to light or sound.  You have a fever.  You feel sick to your stomach (nauseous) or you throw up (vomit).  Your headaches do not get better with treatment. Many people think that they have a sinus headache when they actually have migraines or tension headaches. Get help right away if:  You have vision problems.  You have sudden, severe pain in your face or head.  You have a seizure.  You are confused.  You have a stiff neck. This information is not intended to replace advice given to you by your health care provider. Make sure you discuss any questions you have  with your health care provider. Document Released: 05/16/2004 Document Revised: 12/03/2015 Document Reviewed: 04/04/2014 Elsevier Interactive Patient Education  2018 [employer].    Sinusitis, Adult Sinusitis is soreness and inflammation of your sinuses. Sinuses are hollow spaces in the bones around your face. Your sinuses are located:  Around your eyes.  In the middle of your forehead.  Behind your nose.  In your cheekbones.  Your sinuses and nasal passages are lined with a stringy  fluid (mucus). Mucus normally drains out of your sinuses. When your nasal tissues become inflamed or swollen, the mucus can become trapped or blocked so air cannot flow through your sinuses. This allows bacteria, viruses, and funguses to grow, which leads to infection. Sinusitis can develop quickly and last for 7?10 days (acute) or for more than 12 weeks (chronic). Sinusitis often develops after a cold. What are the causes? This condition is caused by anything that creates swelling in the sinuses or stops mucus from draining, including:  Allergies.  Asthma.  Bacterial or viral infection.  Abnormally shaped bones between the nasal passages.  Nasal growths that contain mucus (nasal polyps).  Narrow sinus openings.  Pollutants, such as chemicals or irritants in the air.  A foreign object stuck in the nose.  A fungal infection. This is rare.  What increases the risk? The following factors may make you more likely to develop this condition:  Having allergies or asthma.  Having had a recent cold or respiratory tract infection.  Having structural deformities or blockages in your nose or sinuses.  Having a weak immune system.  Doing a lot of swimming or diving.  Overusing nasal sprays.  Smoking.  What are the signs or symptoms? The main symptoms of this condition are pain and a feeling of pressure around the affected sinuses. Other symptoms include:  Upper toothache.  Earache.  Headache.  Bad breath.  Decreased sense of smell and taste.  A cough that may get worse at night.  Fatigue.  Fever.  Thick drainage from your nose. The drainage is often green and it may contain pus (purulent).  Stuffy nose or congestion.  Postnasal drip. This is when extra mucus collects in the throat or back of the nose.  Swelling and warmth over the affected sinuses.  Sore throat.  Sensitivity to light.  How is this diagnosed? This condition is diagnosed based on symptoms, a  medical history, and a physical exam. To find out if your condition is acute or chronic, your health care provider may:  Look in your nose for signs of nasal polyps.  Tap over the affected sinus to check for signs of infection.  View the inside of your sinuses using an imaging device that has a light attached (endoscope).  If your health care provider suspects that you have chronic sinusitis, you may also:  Be tested for allergies.  Have a sample of mucus taken from your nose (nasal culture) and checked for bacteria.  Have a mucus sample examined to see if your sinusitis is related to an allergy.  If your sinusitis does not respond to treatment and it lasts longer than 8 weeks, you may have an MRI or CT scan to check your sinuses. These scans also help to determine how severe your infection is. In rare cases, a bone biopsy may be done to rule out more serious types of fungal sinus disease. How is this treated? Treatment for sinusitis depends on the cause and whether your condition is chronic or acute. If  a virus is causing your sinusitis, your symptoms will go away on their own within 10 days. You may be given medicines to relieve your symptoms, including:  Topical nasal decongestants. They shrink swollen nasal passages and let mucus drain from your sinuses.  Antihistamines. These drugs block inflammation that is triggered by allergies. This can help to ease swelling in your nose and sinuses.  Topical nasal corticosteroids. These are nasal sprays that ease inflammation and swelling in your nose and sinuses.  Nasal saline washes. These rinses can help to get rid of thick mucus in your nose.  If your condition is caused by bacteria, you will be given an antibiotic medicine. If your condition is caused by a fungus, you will be given an antifungal medicine. Surgery may be needed to correct underlying conditions, such as narrow nasal passages. Surgery may also be needed to remove  polyps. Follow these instructions at home: Medicines  Take, use, or apply over-the-counter and prescription medicines only as told by your health care provider. These may include nasal sprays.  If you were prescribed an antibiotic medicine, take it as told by your health care provider. Do not stop taking the antibiotic even if you start to feel better. Hydrate and Humidify  Drink enough water to keep your urine clear or pale yellow. Staying hydrated will help to thin your mucus.  Use a cool mist humidifier to keep the humidity level in your home above 50%.  Inhale steam for 10-15 minutes, 3-4 times a day or as told by your health care provider. You can do this in the bathroom while a hot shower is running.  Limit your exposure to cool or dry air. Rest  Rest as much as possible.  Sleep with your head raised (elevated).  Make sure to get enough sleep each night. General instructions  Apply a warm, moist washcloth to your face 3-4 times a day or as told by your health care provider. This will help with discomfort.  Wash your hands often with soap and water to reduce your exposure to viruses and other germs. If soap and water are not available, use hand sanitizer.  Do not smoke. Avoid being around people who are smoking (secondhand smoke).  Keep all follow-up visits as told by your health care provider. This is important. Contact a health care provider if:  You have a fever.  Your symptoms get worse.  Your symptoms do not improve within 10 days. Get help right away if:  You have a severe headache.  You have persistent vomiting.  You have pain or swelling around your face or eyes.  You have vision problems.  You develop confusion.  Your neck is stiff.  You have trouble breathing. This information is not intended to replace advice given to you by your health care provider. Make sure you discuss any questions you have with your health care provider. Document Released:  04/08/2005 Document Revised: 12/03/2015 Document Reviewed: 02/01/2015 Elsevier Interactive Patient Education  2017 [employer].

## 2016-09-26 NOTE — Progress Notes (Addendum)
 Subjective:    Patient ID: Maria Sharp, female    DOB: 10/15/1965, 51 y.o.   MRN: [id number] Chief Complaint  Patient presents with  . Sinusitis    sinus headache and ear pain x10 days   . Dizziness  . Cough    dry     HPI  Maria Sharp is a delightful 51 yo woman here today to discuss her headaches. Went to the [facility] on [address] on 6/2 - she had a HA and one day she felt like she just dropped down an elevated.  Pain radiates over both sides from her ears forward Day 5 of amox 875 bid but makes her feel very drunk and can't tolerate. Did miss Mon, Tues and then yest (Wed) she only took one today. - So only taken 5 pills - gave 7d supply Was feeling a little lightheadedness proir which is getting better.  Is having occ vertigo sxs. She also needs a root canal on the  Using astelin twice.   Not drasining at all - did get a little out last night.  Dry cough, no pharyngitis.  Sleeping well as exhausted from working nights. Appetite fine. Does not have meter - lost rx.  Took one this morning at 5 a.m.   Past Medical History:  Diagnosis Date  . Allergy   . Asthma   . Diabetes mellitus   . Fatty liver   . Gastritis   . Gastroparesis    study 11 14  nl abd us  . GERD (gastroesophageal reflux disease)   . High cholesterol   . History of varicella   . Hypertension   . IBS (irritable bowel syndrome)    Past Surgical History:  Procedure Laterality Date  . ANAL RECTAL MANOMETRY N/A 04/03/2015   Procedure: ANO RECTAL MANOMETRY;  Surgeon: Kavitha Nandigam V, MD;  Location: [facility];  Service: Endoscopy;  Laterality: N/A;  . CHOLECYSTECTOMY    . WISDOM TOOTH EXTRACTION     Current Outpatient Prescriptions on File Prior to Visit  Medication Sig Dispense Refill  . celecoxib (CELEBREX) 100 MG capsule Take 1 capsule (100 mg total) by mouth 2 (two) times daily. (Patient taking differently: Take 100 mg by mouth as needed. ) 60 capsule 1  . EPINEPHrine (EPI-PEN) 0.3 mg/0.3 mL DEVI Inject 0.3  mg into the muscle once as needed. For severe allergic reaction    . omeprazole (PRILOSEC) 40 MG capsule Take 1 capsule (40 mg total) by mouth daily. 90 capsule 3  . polyethylene glycol powder (GLYCOLAX/MIRALAX) powder Take 8.5 g by mouth as needed. 255 g 3  . Vitamin Mixture (VITAMIN E COMPLETE PO) Take by mouth.    . [DISCONTINUED] albuterol (PROVENTIL HFA;VENTOLIN HFA) 108 (90 BASE) MCG/ACT inhaler Inhale 2 puffs into the lungs every 4 (four) hours as needed. 6.7 g 0  . [DISCONTINUED] Azelastine-Fluticasone (DYMISTA) 137-50 MCG/ACT SUSP Place 1 spray into the nose daily. 23 g 0  . [DISCONTINUED] montelukast (SINGULAIR) 10 MG tablet     . [DISCONTINUED] pravastatin (PRAVACHOL) 20 MG tablet Take 1 tablet (20 mg total) by mouth daily. 90 tablet 2   No current facility-administered medications on file prior to visit.    Allergies  Allergen Reactions  . Ace Inhibitors Anaphylaxis  . Shellfish Allergy Anaphylaxis and Other (See Comments)    Only shrimp  . Januvia [Sitagliptin] Other (See Comments)    Recurrent abdominal pain with elevated pancreas enzymes  . Lipitor [Atorvastatin]    Family History  Problem   Relation Age of Onset  . Prostate cancer Father   . Diabetes Father   . Hypertension Father   . Diabetes Mother   . Heart disease Mother   . Arthritis Mother   . Hyperlipidemia Mother   . Hypertension Mother   . Parkinson's disease Mother   . Thyroid disease Mother   . Diabetes Paternal Grandmother   . Arthritis Paternal Grandmother   . Hyperlipidemia Paternal Grandmother   . Heart disease Paternal Grandmother   . Dementia Paternal Grandmother   . Hypertension Paternal Grandmother   . Hyperlipidemia Maternal Grandmother   . Hypertension Maternal Grandmother   . Hyperlipidemia Maternal Grandfather   . Hypertension Maternal Grandfather   . Hyperlipidemia Paternal Grandfather   . Hypertension Paternal Grandfather   . Diabetes Paternal Grandfather   . Cholelithiasis Paternal  Grandfather   . Colon cancer Neg Hx   . Esophageal cancer Neg Hx   . Stomach cancer Neg Hx   . Rectal cancer Neg Hx    Social History   Social History  . Marital status: Single    Spouse name: N/A  . Number of children: N/A  . Years of education: N/A   Social History Main Topics  . Smoking status: Never Smoker  . Smokeless tobacco: Never Used  . Alcohol use No  . Drug use: No  . Sexual activity: Not Asked   Other Topics Concern  . None   Social History Narrative   Usually # of hours of sleep per night: 6   3 of people living at your residence? 2   Lives with her fianc he works night also   He is diabetic   Works [employer] evening shift for a number of years.   Irregular sleep 7:30 to 11 or 12 and then 4 to 6:30 before she goes to work   Has attended college.   [place]   G0P0   Abnormal pap remote last 2012    Depression screen PHQ 2/9 09/26/2016 05/27/2016 10/03/2015 09/22/2014 06/14/2013  Decreased Interest 0 0 0 0 0  Down, Depressed, Hopeless 0 0 0 0 0  PHQ - 2 Score 0 0 0 0 0    Review of Systems No f/c, no vision change   see hpi Objective:   Physical Exam  Constitutional: She is oriented to person, place, and time. She appears well-developed and well-nourished. She appears lethargic. She appears ill. No distress.  HENT:  Head: Normocephalic and atraumatic.  Right Ear: External ear and ear canal normal. Tympanic membrane is retracted. A middle ear effusion is present.  Left Ear: External ear and ear canal normal. Tympanic membrane is retracted. A middle ear effusion is present.  Nose: Mucosal edema and rhinorrhea present. Right sinus exhibits maxillary sinus tenderness. Left sinus exhibits maxillary sinus tenderness.  Mouth/Throat: Uvula is midline and mucous membranes are normal. Posterior oropharyngeal erythema present. No oropharyngeal exudate, posterior oropharyngeal edema or tonsillar abscesses.  Eyes:  Conjunctivae are normal. Right eye exhibits no discharge. Left eye exhibits no discharge. No scleral icterus.  Neck: Normal range of motion. Neck supple.  Cardiovascular: Normal rate, regular rhythm, normal heart sounds and intact distal pulses.   Pulmonary/Chest: Effort normal and breath sounds normal.  Lymphadenopathy:       Head (right side): Submandibular adenopathy present. No preauricular and no posterior auricular adenopathy present.       Head (left side): Submandibular adenopathy present. No preauricular and no posterior auricular adenopathy   present.    She has no cervical adenopathy.       Right: No supraclavicular adenopathy present.       Left: No supraclavicular adenopathy present.  Neurological: She is oriented to person, place, and time. She appears lethargic.  Skin: Skin is warm and dry. She is not diaphoretic. No erythema.  Psychiatric: She has a normal mood and affect. Her behavior is normal.      BP 123/80   Pulse 82   Temp 97.7 F (36.5 C) (Oral)   Resp 18   Ht 5' 10" (1.778 m)   Wt 279 lb 3.2 oz (126.6 kg)   LMP 09/20/2016   SpO2 98%   BMI 40.06 kg/m   Assessment & Plan:   1. Acute recurrent frontal sinusitis - referred to ENT  2. Essential hypertension   3. Type 2 diabetes mellitus with peripheral circulatory disorder (HCC)     Meds ordered this encounter  Medications  . clarithromycin (BIAXIN XL) 500 MG 24 hr tablet    Sig: Take 2 tablets (1,000 mg total) by mouth daily.    Dispense:  20 tablet    Refill:  0  . predniSONE (DELTASONE) 20 MG tablet    Sig: Take 3 tabs qd x 3d, then 2 tabs qd x 3d then 1 tab qd x 3d.    Dispense:  18 tablet    Refill:  0  . olmesartan (BENICAR) 20 MG tablet    Sig: Take 1 tablet (20 mg total) by mouth daily.    Dispense:  90 tablet    Refill:  3  . triamterene-hydrochlorothiazide (MAXZIDE-25) 37.5-25 MG tablet    Sig: Take 1 tablet by mouth daily.    Dispense:  90 tablet    Refill:  3  . metFORMIN  (GLUCOPHAGE-XR) 500 MG 24 hr tablet    Sig: Take 4 tablets (2,000 mg total) by mouth daily with breakfast.    Dispense:  360 tablet    Refill:  3  . blood glucose meter kit and supplies KIT    Sig: Dispense based on patient and insurance preference. Use up to four times daily as directed. (FOR ICD-9 250.00, 250.01).    Dispense:  1 each    Refill:  0    Order Specific Question:   Number of strips    Answer:   1000    Order Specific Question:   Number of lancets    Answer:   1000      Eva Shaw, M.D.  [facility]  [place] [address] [place], [place] [address] [phone] phone [phone] fax  09/28/16 9:57 PM  

## 2016-09-30 ENCOUNTER — Telehealth: Payer: Self-pay

## 2016-09-30 NOTE — Telephone Encounter (Signed)
PA Started Contour Next Meter Awaiting Response

## 2016-10-07 NOTE — Telephone Encounter (Signed)
PA DENIED for CONTOUR TEST METER Drug not covered.  Preferred Products:  [employer] [facility] PRODU[employer]  Appeal Fax # [phone] To appeal send letter with supporting documentation Denial letter placed in scan box.

## 2016-10-08 NOTE — Telephone Encounter (Signed)
New script for One Touch meter at [facility] Pt is aware

## 2016-10-08 NOTE — Telephone Encounter (Signed)
Fine to change to One Touch - please call in a new rx for One Touch meter of pt's choice and all necessary corresponding supplies inc strips to pt's pharmacy.

## 2016-10-10 ENCOUNTER — Encounter: Payer: Managed Care, Other (non HMO) | Admitting: Family Medicine

## 2016-10-19 ENCOUNTER — Encounter: Payer: Managed Care, Other (non HMO) | Admitting: Family Medicine

## 2016-10-28 ENCOUNTER — Encounter (HOSPITAL_BASED_OUTPATIENT_CLINIC_OR_DEPARTMENT_OTHER): Payer: Self-pay | Admitting: *Deleted

## 2016-10-28 ENCOUNTER — Emergency Department (HOSPITAL_BASED_OUTPATIENT_CLINIC_OR_DEPARTMENT_OTHER)
Admission: EM | Admit: 2016-10-28 | Discharge: 2016-10-28 | Disposition: A | Discharge status: Home / Self Care | Payer: Managed Care, Other (non HMO) | Attending: Emergency Medicine | Admitting: Emergency Medicine

## 2016-10-28 DIAGNOSIS — Z7984 Long term (current) use of oral hypoglycemic drugs: Secondary | ICD-10-CM | POA: Insufficient documentation

## 2016-10-28 DIAGNOSIS — J45909 Unspecified asthma, uncomplicated: Secondary | ICD-10-CM | POA: Diagnosis not present

## 2016-10-28 DIAGNOSIS — R42 Dizziness and giddiness: Secondary | ICD-10-CM | POA: Diagnosis not present

## 2016-10-28 DIAGNOSIS — Z79899 Other long term (current) drug therapy: Secondary | ICD-10-CM | POA: Insufficient documentation

## 2016-10-28 DIAGNOSIS — E1151 Type 2 diabetes mellitus with diabetic peripheral angiopathy without gangrene: Secondary | ICD-10-CM | POA: Diagnosis not present

## 2016-10-28 DIAGNOSIS — I10 Essential (primary) hypertension: Secondary | ICD-10-CM | POA: Insufficient documentation

## 2016-10-28 LAB — CBC WITH DIFFERENTIAL/PLATELET
Basophils Absolute: 0.1 10*3/uL (ref 0.0–0.1)
Basophils Relative: 1 %
Eosinophils Absolute: 0.1 10*3/uL (ref 0.0–0.7)
Eosinophils Relative: 1 %
HCT: 35.9 % — ABNORMAL LOW (ref 36.0–46.0)
Hemoglobin: 11.7 g/dL — ABNORMAL LOW (ref 12.0–15.0)
Lymphocytes Relative: 28 %
Lymphs Abs: 2.4 10*3/uL (ref 0.7–4.0)
MCH: 27 pg (ref 26.0–34.0)
MCHC: 32.6 g/dL (ref 30.0–36.0)
MCV: 82.9 fL (ref 78.0–100.0)
Monocytes Absolute: 0.7 10*3/uL (ref 0.1–1.0)
Monocytes Relative: 8 %
Neutro Abs: 5.2 10*3/uL (ref 1.7–7.7)
Neutrophils Relative %: 62 %
Platelets: 408 10*3/uL — ABNORMAL HIGH (ref 150–400)
RBC: 4.33 MIL/uL (ref 3.87–5.11)
RDW: 15.7 % — ABNORMAL HIGH (ref 11.5–15.5)
WBC: 8.4 10*3/uL (ref 4.0–10.5)

## 2016-10-28 LAB — BASIC METABOLIC PANEL
Anion gap: 11 (ref 5–15)
BUN: 12 mg/dL (ref 6–20)
CO2: 26 mmol/L (ref 22–32)
Calcium: 9.2 mg/dL (ref 8.9–10.3)
Chloride: 99 mmol/L — ABNORMAL LOW (ref 101–111)
Creatinine, Ser: 0.75 mg/dL (ref 0.44–1.00)
GFR calc Af Amer: >60 mL/min (ref >60)
GFR calc non Af Amer: >60 mL/min (ref >60)
Glucose, Bld: 138 mg/dL — ABNORMAL HIGH (ref 65–99)
Potassium: 3.5 mmol/L (ref 3.5–5.1)
Sodium: 136 mmol/L (ref 135–145)

## 2016-10-28 MED ORDER — PREDNISONE 20 MG PO TABS: ORAL_TABLET | ORAL | 0 refills | Status: DC

## 2016-10-28 MED ORDER — MECLIZINE HCL 25 MG PO TABS
25.0000 mg | ORAL_TABLET | Freq: Once | ORAL | Status: AC
  Administered 2016-10-28: 25 mg via ORAL
  Filled 2016-10-28: qty 1

## 2016-10-28 MED ORDER — MECLIZINE HCL 25 MG PO TABS: 25.0000 mg | ORAL_TABLET | Freq: Three times a day (TID) | ORAL | 0 refills | Status: DC | PRN

## 2016-10-28 MED FILL — MECLIZINE 25 MG TABLET: 25 | 5 days supply | Qty: 15 | Fill #0

## 2016-10-28 MED FILL — predniSONE 20 MG TABS: 20 | 9 days supply | Qty: 18 | Fill #0

## 2016-10-28 NOTE — Discharge Instructions (Signed)
Take meclizine as needed for dizziness.   Take prednisone as prescribed as it may help your sinusitis.   Continue your nasal spray as prescribed by your allergist   See Dr. Erik Obey, your ENT, and Dr. Brigitte Pulse, PCP for follow up   Return to ER if you have worse dizziness, vomiting, headaches, falling, weakness, numbness, trouble speaking

## 2016-10-28 NOTE — ED Triage Notes (Signed)
Pt reports dizziness with onset at 0630 this am. Also reports joint pain x 1 week. Reports episode of vertigo on Friday and was seen by her PCP that day. Reports facial pain and ear pain this morning. Grips strong and equal, facial symmetry present, no drift

## 2016-10-28 NOTE — ED Provider Notes (Signed)
[place] [facility] Provider Note   CSN: [id number] Arrival date & time: 10/28/16  4765     History   Chief Complaint Chief Complaint  Patient presents with  . Dizziness    HPI Maria Sharp is a 51 y.o. female.  HPI 51 year-old [place] American female past medical history significant for allergies, asthma, diabetes, gastroparesis, GERD, hypertension that presents to the emergency Department today with complaints of dizziness. Patient states that her dizziness has been ongoing for the past one month. She was seen by her PCP one month ago for same. At that time she had a CAT scan of her head that showed no internal bleeding. She was noted to have sinusitis. Started on prednisone and antibiotic. States her symptoms improved. Says that her symptoms have been intermittent. Patient describes the dizziness as room spinning sensation. Worse with changing positions or riding in a car. States that she woke up this morning at approximate 6:30 and open her eyes and set up in the bed and was very dizzy. Room spinning sensation. States this has slightly resolved since arriving to the ED. States that her symptoms are intermittent. Nothing makes her symptoms better. She does have history of chronic sinusitis and states that she has sinus pressure this time. Feels that her sinus infection is worsening her dizziness. She denies any lightheadedness, vision changes, syncope, chest pain, shortness of breath, headache, nausea, vomiting, abdominal pain, diarrhea. Past Medical History:  Diagnosis Date  . Allergy   . Asthma   . Diabetes mellitus   . Fatty liver   . Gastritis   . Gastroparesis    study 11 14  nl abd [place]  . GERD (gastroesophageal reflux disease)   . High cholesterol   . History of varicella   . Hypertension   . IBS (irritable bowel syndrome)     Patient Active Problem List   Diagnosis Date Noted  . Constipation   . Asthma, chronic 09/28/2014  . Gastroparesis   . Varicose veins  09/14/2013  . Neck pain on left side 06/14/2013  . Current non-adherence to medical treatment 06/14/2013  . Medication side effect 02/10/2013  . Obesity (BMI 30-39.9) 02/10/2013  . Vitamin D deficiency 10/05/2012  . Leg cramps 10/05/2012  . Family history of diabetes mellitus 10/05/2012  . 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 ABDOMINAL PAIN, UNSPECIFIED SITE 05/18/2010  . CHEST PAIN, INTERMITTENT 05/12/2009  . OBESITY-MORBID (>100') 04/05/2009  . CARDIOMEGALY 04/05/2009  . PALPITATIONS 04/05/2009  . Type 2 diabetes mellitus with peripheral circulatory disorder ([place]) 03/15/2009  . BACK PAIN 03/09/2008  . Hyperlipidemia LDL goal <100 03/03/2008  . Essential hypertension 03/03/2008  . GERD 03/03/2008  . IRRITABLE BOWEL SYNDROME 03/03/2008  . RECTAL BLEEDING 03/03/2008    Past Surgical History:  Procedure Laterality Date  . ANAL RECTAL MANOMETRY N/A 04/03/2015   Procedure: ANO RECTAL MANOMETRY;  Surgeon: Mauri Pole, MD;  Location: [facility];  Service: Endoscopy;  Laterality: N/A;  . CHOLECYSTECTOMY    . WISDOM TOOTH EXTRACTION      OB History    Gravida Para Term Preterm AB Living   0             SAB TAB Ectopic Multiple Live Births                   Home Medications    Prior to Admission medications   Medication Sig Start Date End Date Taking? Authorizing Provider  blood glucose meter kit and supplies KIT  Dispense based on patient and insurance preference. Use up to four times daily as directed. (FOR ICD-9 250.00, 250.01). 09/26/16   Shawnee Knapp, MD  celecoxib (CELEBREX) 100 MG capsule Take 1 capsule (100 mg total) by mouth 2 (two) times daily. Patient taking differently: Take 100 mg by mouth as needed.  02/19/16   Meredith Pel, MD  clarithromycin (BIAXIN XL) 500 MG 24 hr tablet Take 2 tablets (1,000 mg total) by mouth daily. 09/26/16   Shawnee Knapp, MD  EPINEPHrine (EPI-PEN) 0.3 mg/0.3 mL DEVI Inject 0.3 mg into the muscle once as needed. For severe allergic  reaction    [provider]  meclizine (ANTIVERT) 25 MG tablet Take 1 tablet (25 mg total) by mouth 3 (three) times daily as needed for dizziness. 10/28/16   Drenda Freeze, MD  metFORMIN (GLUCOPHAGE-XR) 500 MG 24 hr tablet Take 4 tablets (2,000 mg total) by mouth daily with breakfast. 09/26/16   Shawnee Knapp, MD  olmesartan (BENICAR) 20 MG tablet Take 1 tablet (20 mg total) by mouth daily. 09/26/16   Shawnee Knapp, MD  omeprazole (PRILOSEC) 40 MG capsule Take 1 capsule (40 mg total) by mouth daily. 05/27/16   Shawnee Knapp, MD  polyethylene glycol powder (GLYCOLAX/MIRALAX) powder Take 8.5 g by mouth as needed. 02/13/15   Mauri Pole, MD  predniSONE (DELTASONE) 20 MG tablet Take 3 tabs qd x 3d, then 2 tabs qd x 3d then 1 tab qd x 3d. 10/28/16   Drenda Freeze, MD  triamterene-hydrochlorothiazide (MAXZIDE-25) 37.5-25 MG tablet Take 1 tablet by mouth daily. 09/26/16   Shawnee Knapp, MD  Vitamin Mixture (VITAMIN E COMPLETE PO) Take by mouth.    [provider]    Family History Family History  Problem Relation Age of Onset  . Prostate cancer Father   . Diabetes Father   . Hypertension Father   . Diabetes Mother   . Heart disease Mother   . Arthritis Mother   . Hyperlipidemia Mother   . Hypertension Mother   . Parkinson's disease Mother   . Thyroid disease Mother   . Diabetes Paternal Grandmother   . Arthritis Paternal Grandmother   . Hyperlipidemia Paternal Grandmother   . Heart disease Paternal Grandmother   . Dementia Paternal Grandmother   . Hypertension Paternal Grandmother   . Hyperlipidemia Maternal Grandmother   . Hypertension Maternal Grandmother   . Hyperlipidemia Maternal Grandfather   . Hypertension Maternal Grandfather   . Hyperlipidemia Paternal Grandfather   . Hypertension Paternal Grandfather   . Diabetes Paternal Grandfather   . Cholelithiasis Paternal Grandfather   . Colon cancer Neg Hx   . Esophageal cancer Neg Hx   . Stomach cancer Neg Hx   .  Rectal cancer Neg Hx     Social History Social History  Substance Use Topics  . Smoking status: Never Smoker  . Smokeless tobacco: Never Used  . Alcohol use No     Allergies   Ace inhibitors; Shellfish allergy; Januvia [sitagliptin]; and Lipitor [atorvastatin]   Review of Systems Review of Systems  Constitutional: Negative for chills and fever.  HENT: Positive for ear pain, sinus pain and sinus pressure. Negative for congestion.   Eyes: Negative for visual disturbance.  Respiratory: Negative for cough and shortness of breath.   Cardiovascular: Negative for chest pain.  Gastrointestinal: Negative for abdominal pain, diarrhea, nausea and vomiting.  Genitourinary: Negative for dysuria, flank pain, frequency, hematuria, urgency, vaginal bleeding and vaginal  discharge.  Musculoskeletal: Negative for arthralgias and myalgias.  Skin: Negative for rash.  Neurological: Positive for dizziness. Negative for syncope, weakness, light-headedness, numbness and headaches.  Psychiatric/Behavioral: Negative for sleep disturbance. The patient is not nervous/anxious.      Physical Exam Updated Vital Signs BP 126/76 (BP Location: Left Arm)   Pulse 84   Temp 99.1 F (37.3 C) (Oral)   Resp 16   Ht 5' 10"  (1.778 m)   Wt 126.1 kg (278 lb)   LMP 10/19/2016   SpO2 98%   BMI 39.89 kg/m   Physical Exam  Constitutional: She is oriented to person, place, and time. She appears well-developed and well-nourished.  Non-toxic appearance. No distress.  HENT:  Head: Normocephalic and atraumatic.  Right Ear: Hearing, tympanic membrane, external ear and ear canal normal.  Left Ear: Hearing, tympanic membrane, external ear and ear canal normal.  Nose: No mucosal edema or rhinorrhea. Right sinus exhibits maxillary sinus tenderness and frontal sinus tenderness. Left sinus exhibits maxillary sinus tenderness and frontal sinus tenderness.  Mouth/Throat: Uvula is midline, oropharynx is clear and moist and  mucous membranes are normal.  Eyes: Conjunctivae and EOM are normal. Pupils are equal, round, and reactive to light. Right eye exhibits no discharge. Left eye exhibits no discharge.  Neck: Normal range of motion. Neck supple.  No c spine midline tenderness. No paraspinal tenderness. No deformities or step offs noted. Full ROM. Supple. No nuchal rigidity.    Cardiovascular: Normal rate, regular rhythm, normal heart sounds and intact distal pulses.  Exam reveals no gallop and no friction rub.   No murmur heard. Pulmonary/Chest: Effort normal and breath sounds normal. No respiratory distress. She has no wheezes. She has no rales. She exhibits no tenderness.  Abdominal: Soft. Bowel sounds are normal. There is no tenderness. There is no rebound and no guarding.  Musculoskeletal: Normal range of motion. She exhibits no tenderness.  Lymphadenopathy:    She has no cervical adenopathy.  Neurological: She is alert and oriented to person, place, and time.  The patient is alert, attentive, and oriented x 3. Speech is clear. Cranial nerve II-VII grossly intact. Negative pronator drift. Sensation intact. Strength 5/5 in all extremities. Reflexes 2+ and symmetric at biceps, triceps, knees, and ankles. Rapid alternating movement and fine finger movements intact. Romberg is absent. Posture and gait normal.  Patient does have a positive Hallpike maneuver with horizontal nystagmus and reproducing her symptoms   Skin: Skin is warm and dry. Capillary refill takes less than 2 seconds.  Psychiatric: Her behavior is normal. Judgment and thought content normal.  Nursing note and vitals reviewed.    ED Treatments / Results  Labs (all labs ordered are listed, but only abnormal results are displayed) Labs Reviewed  CBC WITH DIFFERENTIAL/PLATELET - Abnormal; Notable for the following:       Result Value   Hemoglobin 11.7 (*)    HCT 35.9 (*)    RDW 15.7 (*)    Platelets 408 (*)    All other components within  normal limits  BASIC METABOLIC PANEL - Abnormal; Notable for the following:    Chloride 99 (*)    Glucose, Bld 138 (*)    All other components within normal limits    EKG  EKG Interpretation None       Radiology No results found.  Procedures Procedures (including critical care time)  Medications Ordered in ED Medications  meclizine (ANTIVERT) tablet 25 mg (25 mg Oral Given 10/28/16 1020)  Initial Impression / Assessment and Plan / ED Course  I have reviewed the triage vital signs and the nursing notes.  Pertinent labs & imaging results that were available during my care of the patient were reviewed by me and considered in my medical decision making (see chart for details).     Patient presents to the emergency Department today with complaints of dizziness that has been ongoing and intermittent for the past one month. Seen by PCP for same and had a negative CT scan. Does have chronic sinusitis. Is followed by ENT but has not seen them recently. Patient states that one month ago she was prescribed prednisone antibiotics would help her symptoms. Patient describes the dizziness as room spinning sensation. She denies any associated symptoms including nausea, vomiting, lightheadedness, syncope, vision changes, headache. Doubt any intracranial abnormalities. Her neuro exam is without any focal neuro deficits. She is neurovascularly intact in all extremities. Patient does have a positive Dix-Hallpike maneuver with reproduction of symptoms and some horizontal nystagmus which seems consistent with paroxysmal vertigo. This is likely aggravated by chronic sinusitis. Lab work is benign. No signs of anemia. Do not feel that additional CT of head is indicated at this time. Patient was given meclizine in the ED. States that her symptoms have completely resolved. She was seen by my attending Dr. Darl Householder who is agreeable to above plan. Have encouraged patient to follow up with her PCP and ENT doctor.  Will be discharged with meclizine and prednisone. Patient is overall well-appearing and nontoxic.  Pt is hemodynamically stable, in NAD, & able to ambulate in the ED. Evaluation does not show pathology that would require ongoing emergent intervention or inpatient treatment. I explained the diagnosis to the patient. Pain has been managed & has no complaints prior to dc. Pt is comfortable with above plan and is stable for discharge at this time. All questions were answered prior to disposition. Strict return precautions for f/u to the ED were discussed. Encouraged follow up with PCP.   Final Clinical Impressions(s) / ED Diagnoses   Final diagnoses:  Vertigo    New Prescriptions Discharge Medication List as of 10/28/2016 12:02 PM    START taking these medications   Details  meclizine (ANTIVERT) 25 MG tablet Take 1 tablet (25 mg total) by mouth 3 (three) times daily as needed for dizziness., Starting Mon 10/28/2016, Print         Doristine Devoid, PA-C 10/28/16 1229    Drenda Freeze, MD 10/28/16 [phone]

## 2016-10-29 ENCOUNTER — Telehealth: Payer: Self-pay | Admitting: Family Medicine

## 2016-10-29 NOTE — Telephone Encounter (Signed)
Please advise 

## 2016-10-29 NOTE — Telephone Encounter (Signed)
Pt called requesting referral to ENT she said was discussed at last appt. Pt said she went to the Emergency Room yesterday and they suggested she go to her ENT for her sinuses and form of vertigo. Pt goes to [facility]. Please advise.

## 2016-10-30 NOTE — Addendum Note (Signed)
Addended by: Shawnee Knapp on: 10/30/2016 01:21 PM   Modules accepted: Orders

## 2016-10-30 NOTE — Telephone Encounter (Signed)
Referral placed.

## 2016-11-08 ENCOUNTER — Ambulatory Visit: Payer: Managed Care, Other (non HMO) | Admitting: Podiatry

## 2016-11-13 ENCOUNTER — Encounter: Payer: Managed Care, Other (non HMO) | Admitting: Family Medicine

## 2016-11-16 ENCOUNTER — Encounter: Payer: Self-pay | Admitting: Family Medicine

## 2016-11-16 ENCOUNTER — Ambulatory Visit (INDEPENDENT_AMBULATORY_CARE_PROVIDER_SITE_OTHER): Payer: 59 | Admitting: Family Medicine

## 2016-11-16 VITALS — BP 132/83 | HR 85 | Temp 98.7°F | Resp 17 | Ht 70.0 in | Wt 286.2 lb

## 2016-11-16 DIAGNOSIS — Z0000 Encounter for general adult medical examination without abnormal findings: Secondary | ICD-10-CM

## 2016-11-16 DIAGNOSIS — Z130 Encounter for screening for diseases of the blood and blood-forming organs and certain disorders involving the immune mechanism: Secondary | ICD-10-CM

## 2016-11-16 DIAGNOSIS — Z6841 Body Mass Index (BMI) 40.0 and over, adult: Secondary | ICD-10-CM

## 2016-11-16 DIAGNOSIS — Z1231 Encounter for screening mammogram for malignant neoplasm of breast: Secondary | ICD-10-CM | POA: Diagnosis not present

## 2016-11-16 DIAGNOSIS — J328 Other chronic sinusitis: Secondary | ICD-10-CM

## 2016-11-16 DIAGNOSIS — Z1383 Encounter for screening for respiratory disorder NEC: Secondary | ICD-10-CM

## 2016-11-16 DIAGNOSIS — Z136 Encounter for screening for cardiovascular disorders: Secondary | ICD-10-CM

## 2016-11-16 DIAGNOSIS — Z124 Encounter for screening for malignant neoplasm of cervix: Secondary | ICD-10-CM

## 2016-11-16 DIAGNOSIS — Z1329 Encounter for screening for other suspected endocrine disorder: Secondary | ICD-10-CM

## 2016-11-16 DIAGNOSIS — I517 Cardiomegaly: Secondary | ICD-10-CM | POA: Diagnosis not present

## 2016-11-16 DIAGNOSIS — Z1389 Encounter for screening for other disorder: Secondary | ICD-10-CM | POA: Diagnosis not present

## 2016-11-16 DIAGNOSIS — K582 Mixed irritable bowel syndrome: Secondary | ICD-10-CM | POA: Diagnosis not present

## 2016-11-16 DIAGNOSIS — E785 Hyperlipidemia, unspecified: Secondary | ICD-10-CM | POA: Diagnosis not present

## 2016-11-16 DIAGNOSIS — I10 Essential (primary) hypertension: Secondary | ICD-10-CM | POA: Diagnosis not present

## 2016-11-16 DIAGNOSIS — Z789 Other specified health status: Secondary | ICD-10-CM

## 2016-11-16 DIAGNOSIS — Z1211 Encounter for screening for malignant neoplasm of colon: Secondary | ICD-10-CM

## 2016-11-16 DIAGNOSIS — Z113 Encounter for screening for infections with a predominantly sexual mode of transmission: Secondary | ICD-10-CM | POA: Diagnosis not present

## 2016-11-16 DIAGNOSIS — I83813 Varicose veins of bilateral lower extremities with pain: Secondary | ICD-10-CM

## 2016-11-16 DIAGNOSIS — E1151 Type 2 diabetes mellitus with diabetic peripheral angiopathy without gangrene: Secondary | ICD-10-CM

## 2016-11-16 DIAGNOSIS — Z1212 Encounter for screening for malignant neoplasm of rectum: Secondary | ICD-10-CM

## 2016-11-16 DIAGNOSIS — E559 Vitamin D deficiency, unspecified: Secondary | ICD-10-CM | POA: Diagnosis not present

## 2016-11-16 LAB — POCT URINALYSIS DIP (MANUAL ENTRY)
Bilirubin, UA: NEGATIVE
Blood, UA: NEGATIVE
Glucose, UA: NEGATIVE mg/dL
Ketones, POC UA: NEGATIVE mg/dL
Leukocytes, UA: NEGATIVE
Nitrite, UA: NEGATIVE
Protein Ur, POC: NEGATIVE mg/dL
Spec Grav, UA: 1.025 (ref 1.010–1.025)
Urobilinogen, UA: 0.2 E.U./dL
pH, UA: 6 (ref 5.0–8.0)

## 2016-11-16 LAB — POCT GLYCOSYLATED HEMOGLOBIN (HGB A1C): Hemoglobin A1C: 7.7

## 2016-11-16 MED ORDER — DAPAGLIFLOZIN PRO-METFORMIN ER 5-1000 MG PO TB24: 1.0000 | ORAL_TABLET | Freq: Every day | ORAL | 3 refills | Status: DC

## 2016-11-16 MED ORDER — PITAVASTATIN CALCIUM 1 MG PO TABS: 1.0000 mg | ORAL_TABLET | Freq: Every day | ORAL | 0 refills | Status: DC

## 2016-11-16 NOTE — Progress Notes (Addendum)
Subjective:  By signing my name below, I, Maria Sharp, attest that this documentation has been prepared under the direction and in the presence of Maria Cheadle, MD. Electronically Signed: Moises Sharp, [place]. 11/16/2016 , 11:12 AM .  Patient was seen in Room 12 .   Patient ID: Maria Sharp, female    DOB: Apr 15, 1966, 51 y.o.   MRN: [id number] Chief Complaint  Patient presents with  . Annual Exam   HPI Maria Sharp is a 51 y.o. female who presents to Primary Care at [facility] for annual physical. Patient was last seen 6 weeks prior for sinusitis that had been resistant to augmentin. She was treated with a course of prednisone and biaxin and referred patient to ENT. She is fasting today.   Her last lipid panel was done 5 months prior and was above goal with LDL; she wasn't on a statin as she was intolerant to lipitor. She also has ACEI anaphylactic allergy but is tolerating Benigar. In 2014, patient had side effects lipitor and stopped on her own. She was switched to pravastatin, which she was highly non compliant due to side effects.   Lab Results  Component Value Date   VD25OH 26.1 (L) 05/27/2016   Lab Results  Component Value Date   HGBA1C 6.9 05/27/2016   HGBA1C 7.0 10/03/2015   HGBA1C 6.4 09/22/2014    Vision: She sees Maria Sharp for ophtho; last visit May 2017; plans to make an appointment.  Mammogram: done at breast center; required additional imaging which was benign in March 2017; advised repeat 1 year.  Colonoscopy: done at [facility], normal in 2009 by Maria Sharp.  Pap smear: last done in March 2017 at [facility], Maria Sharp.  Immunizations: Flu shot, pneumonia vaccine and tetanus are all up to date.   She was seen in the ER for vertigo 2 weeks prior; exam was completely normal after given meclizine as were labs. Patient has appointment with ENT, Maria Sharp, and was placed on another course of prednisone 32m for 9 days. Her dizziness has improved, but will  still see her ENT. She's only taken 1 tablet of meclizine at home.   She's been taking metformin 20059min the morning. Discussed other medications for diabetes and weight loss. She denies checking her BP at home.   Supplements: She takes OTC Vitamin D 5000 units once a week.   Past Medical History:  Diagnosis Date  . Allergy   . Asthma   . Diabetes mellitus   . Fatty liver   . Gastritis   . Gastroparesis    study 11 14  [place]d [place]. GERD (gastroesophageal reflux disease)   . High cholesterol   . History of varicella   . Hypertension   . IBS (irritable bowel syndrome)    Prior to Admission medications   Medication Sig Start Date End Date Taking? Authorizing Provider  Sharp glucose meter kit and supplies KIT Dispense based on patient and insurance preference. Use up to four times daily as directed. (FOR ICD-9 250.00, 250.01). 09/26/16   ShShawnee KnappMD  celecoxib (CELEBREX) 100 MG capsule Take 1 capsule (100 mg total) by mouth 2 (two) times daily. Patient taking differently: Take 100 mg by mouth as needed.  02/19/16   DeMeredith PelMD  clarithromycin (BIAXIN XL) 500 MG 24 hr tablet Take 2 tablets (1,000 mg total) by mouth daily. 09/26/16   ShShawnee KnappMD  EPINEPHrine (EPI-PEN) 0.3 mg/0.3 mL  DEVI Inject 0.3 mg into the muscle once as needed. For severe allergic reaction    [provider]  meclizine (ANTIVERT) 25 MG tablet Take 1 tablet (25 mg total) by mouth 3 (three) times daily as needed for dizziness. 10/28/16   Maria Freeze, MD  metFORMIN (GLUCOPHAGE-XR) 500 MG 24 hr tablet Take 4 tablets (2,000 mg total) by mouth daily with breakfast. 09/26/16   Maria Knapp, MD  olmesartan (BENICAR) 20 MG tablet Take 1 tablet (20 mg total) by mouth daily. 09/26/16   Maria Knapp, MD  omeprazole (PRILOSEC) 40 MG capsule Take 1 capsule (40 mg total) by mouth daily. 05/27/16   Maria Knapp, MD  polyethylene glycol powder (GLYCOLAX/MIRALAX) powder Take 8.5 g by mouth as needed. 02/13/15    Maria Pole, MD  predniSONE (DELTASONE) 20 MG tablet Take 3 tabs qd x 3d, then 2 tabs qd x 3d then 1 tab qd x 3d. 10/28/16   Maria Freeze, MD  triamterene-hydrochlorothiazide (MAXZIDE-25) 37.5-25 MG tablet Take 1 tablet by mouth daily. 09/26/16   Maria Knapp, MD  Vitamin Mixture (VITAMIN E COMPLETE PO) Take by mouth.    [provider]   Allergies  Allergen Reactions  . Ace Inhibitors Anaphylaxis  . Shellfish Allergy Anaphylaxis and Other (See Comments)    Only shrimp  . Januvia [Sitagliptin] Other (See Comments)    Recurrent abdominal pain with elevated pancreas enzymes  . Lipitor [Atorvastatin]    Past Surgical History:  Procedure Laterality Date  . ANAL RECTAL MANOMETRY N/A 04/03/2015   Procedure: ANO RECTAL MANOMETRY;  Surgeon: Maria Pole, MD;  Location: [facility];  Service: Endoscopy;  Laterality: N/A;  . CHOLECYSTECTOMY    . WISDOM TOOTH EXTRACTION     Family History  Problem Relation Age of Onset  . Prostate cancer Father   . Diabetes Father   . Hypertension Father   . Diabetes Mother   . Heart disease Mother   . Arthritis Mother   . Hyperlipidemia Mother   . Hypertension Mother   . Parkinson's disease Mother   . Thyroid disease Mother   . Diabetes Paternal Grandmother   . Arthritis Paternal Grandmother   . Hyperlipidemia Paternal Grandmother   . Heart disease Paternal Grandmother   . Dementia Paternal Grandmother   . Hypertension Paternal Grandmother   . Hyperlipidemia Maternal Grandmother   . Hypertension Maternal Grandmother   . Hyperlipidemia Maternal Grandfather   . Hypertension Maternal Grandfather   . Hyperlipidemia Paternal Grandfather   . Hypertension Paternal Grandfather   . Diabetes Paternal Grandfather   . Cholelithiasis Paternal Grandfather   . Colon cancer Neg Hx   . Esophageal cancer Neg Hx   . Stomach cancer Neg Hx   . Rectal cancer Neg Hx    Social History   Social History  . Marital status: Single    Spouse  name: N/A  . Number of children: N/A  . Years of education: N/A   Social History Main Topics  . Smoking status: Never Smoker  . Smokeless tobacco: Never Used  . Alcohol use No  . Drug use: No  . Sexual activity: Not Asked   Other Topics Concern  . None   Social History Narrative   Usually # of hours of sleep per night: 6   3 of people living at your residence? 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   Irregular sleep 7:30 to 11 or 12 and then 4 to 6:30 before she goes to work   Has attended college.   Originally from [place]   Abnormal pap remote last 2012    Depression screen [facility] 2/9 11/16/2016 09/26/2016 05/27/2016 10/03/2015 09/22/2014  Decreased Interest 0 0 0 0 0  Down, Depressed, Hopeless 0 0 0 0 0  PHQ - 2 Score 0 0 0 0 0    Review of Systems  Constitutional: Positive for fatigue. Negative for chills, fever and unexpected weight change.  Respiratory: Negative for cough.   Cardiovascular: Negative for leg swelling.  Gastrointestinal: Negative for constipation, diarrhea, nausea and vomiting.  Skin: Negative for rash and wound.  Allergic/Immunologic: Positive for environmental allergies and food allergies.  Neurological: Positive for dizziness and light-headedness. Negative for weakness and headaches.       Objective:   Physical Exam  Constitutional: She is oriented to person, place, and time. She appears well-developed and well-nourished. No distress.  HENT:  Head: Normocephalic and atraumatic.  Right Ear: Tympanic membrane normal.  Left Ear: Tympanic membrane normal.  Nose: Mucosal edema (right nasal erythema and edema; left is normal) present.  Eyes: Pupils are equal, round, and reactive to light. EOM are normal.  Neck: Neck supple. No thyromegaly present.  Cardiovascular: Normal rate, regular rhythm and normal heart sounds.   No murmur  heard. Pulses:      Dorsalis pedis pulses are 2+ on the right side, and 2+ on the left side.  Pulmonary/Chest: Effort normal and breath sounds normal. No respiratory distress. She has no wheezes.  Abdominal: Soft. Bowel sounds are normal. She exhibits no distension. There is no hepatosplenomegaly. There is no tenderness.  Musculoskeletal: Normal range of motion.  Moderate R>L pitting edema and varicosities throughout  Neurological: She is alert and oriented to person, place, and time.  Skin: Skin is warm and dry.  Psychiatric: She has a normal mood and affect. Her behavior is normal.  Nursing note and vitals reviewed.   BP 132/83   Pulse 85   Temp 98.7 F (37.1 C) (Oral)   Resp 17   Ht 5' 10" (1.778 m)   Wt 286 lb 4 oz (129.8 kg)   LMP 10/19/2016   SpO2 100%   BMI 41.07 kg/m   Wt Readings from Last 3 Encounters:  11/16/16 286 lb 4 oz (129.8 kg)  10/28/16 278 lb (126.1 kg)  09/26/16 279 lb 3.2 oz (126.6 kg)   Results for orders placed or performed in visit on 11/16/16  POCT glycosylated hemoglobin (Hb A1C)  Result Value Ref Range   Hemoglobin A1C 7.7        Assessment & Plan:  Referred to vascular and Dr. Collene Mares.  1. Annual physical exam   2. Routine screening for STI (sexually transmitted infection)   3. Encounter for screening mammogram for breast cancer   4. Screening for cardiovascular, respiratory, and genitourinary diseases   5. Screening for cervical cancer   6. Screening for colorectal cancer   7. Screening for deficiency anemia   8. Screening for thyroid disorder   9. Type 2 diabetes mellitus with peripheral circulatory disorder (HCC) - currently a1c still slightly above goal on metformin XR 2043m qam - also poss that metformin is irritating her IBS - [facility] risks/benefits of all various med classes explained in detail to pt. She is very  reluctant to do injectable since reminds her of insulin but would def benefit from med to help with weight  loss so would be willing to reconsider if she fails SGLT2.  10. Irritable bowel syndrome with both constipation and diarrhea - will refer [facility] per pt request due to recent increase in flairs. She will be due for her colonoscopy next yr but would like to transfer her care from [place]. Cont prilosec with prn miralax  11. CHRONIC RHINOSINUSITIS   12. Essential hypertension - controlled on olmesartan 20 and maxzide  13. Cardiomegaly   14. Hyperlipidemia LDL goal <100   15. Vitamin D deficiency   16. Class 3 severe obesity due to excess calories with serious comorbidity and body mass index (BMI) of 40.0 to 44.9 in adult ([place])   17. Statin intolerance - pt willing to do trial of livalo  18.    B painful varicose veins - refer back to vascular pt pt request  Orders Placed This Encounter  Procedures  . CBC  . Comprehensive metabolic panel    Order Specific Question:   Has the patient fasted?    Answer:   Yes  . Lipid panel    Order Specific Question:   Has the patient fasted?    Answer:   Yes  . TSH  . VITAMIN D 25 Hydroxy (Vit-D Deficiency, Fractures)  . Microalbumin/Creatinine Ratio, Urine  . POCT glycosylated hemoglobin (Hb A1C)  . POCT urinalysis dipstick    Meds ordered this encounter  Medications  . Pitavastatin Calcium (LIVALO) 1 MG TABS    Sig: Take 1 tablet (1 mg total) by mouth daily.    Dispense:  90 tablet    Refill:  0  . Dapagliflozin-Metformin HCl ER (XIGDUO XR) 08-998 MG TB24    Sig: Take 1 tablet by mouth daily.    Dispense:  30 tablet    Refill:  3   Rehceck in 3 mos with ua, cmp, a1c due to starting xigduo and livalo.  I personally performed the services described in this documentation, which was scribed in my presence. The recorded information has been reviewed and considered, and addended by me as needed.   Maria Sharp, M.D.  Primary Care at [facility] [address] [place], [place] [address] [phone] phone [phone]  fax  11/18/16 10:02 PM

## 2016-11-16 NOTE — Patient Instructions (Addendum)
IF you received an x-ray today, you will receive an invoice from [facility]. Please contact [facility] at [phone] with questions or concerns regarding your invoice.   IF you received labwork today, you will receive an invoice from [facility]. Please contact [facility] at [phone] with questions or concerns regarding your invoice.   Our billing staff will not be able to assist you with questions regarding bills from these companies.  You will be contacted with the lab results as soon as they are available. The fastest way to get your results is to activate your My Chart account. Instructions are located on the last page of this paperwork. If you have not heard from [place] regarding the results in 2 weeks, please contact this office.      Calcium Intake Recommendations Calcium is a mineral that affects many functions in the body, including:  Blood clotting.  Blood vessel function.  Nerve impulse conduction.  Hormone secretion.  Muscle contraction.  Bone and teeth functions.  Most of your body's calcium supply is stored in your bones and teeth. When your calcium stores are low, you may be at risk for low bone mass, bone loss, and bone fractures. Consuming enough calcium helps to grow healthy bones and teeth and to prevent breakdown over time. It is very important that you get enough calcium if you are:  A child undergoing rapid growth.  An adolescent girl.  A pre- or post-menopausal woman.  A woman whose menstrual cycle has stopped due to anorexia nervosa or regular intense exercise.  An individual with lactose intolerance or a milk allergy.  A vegetarian.  What is my plan? Try to consume the recommended amount of calcium daily based on your age. Depending on your overall health, your health care provider may recommend increased calcium intake.General daily calcium intake recommendations by age are:  Birth to 6 months: 200 mg.  Infants 7 to 12  months: 260 mg.  Children 1 to 3 years: 700 mg.  Children 4 to 8 years: 1,000 mg.  Children 9 to 13 years: 1,300 mg.  Teens 14 to 18 years: 1,300 mg.  Adults 19 to 50 years: 1,000 mg.  Adult women 51 to 70 years: 1,200 mg.  Adult men 51 to 70 years: 1,000 mg.  Adults 71 years and older: 1,200 mg.  Pregnant and breastfeeding teens: 1,300 mg.  Pregnant and breastfeeding adults: 1,000 mg.  What do I need to know about calcium intake?  In order for the body to absorb calcium, it needs vitamin D. You can get vitamin D through: ? Direct exposure of the skin to sunlight. ? Foods, such as egg yolks, liver, saltwater fish, and fortified milk. ? Supplements.  Consuming too much calcium may cause: ? Constipation. ? Decreased absorption of iron and zinc. ? Kidney stones.  Calcium supplements may interact with certain medicines. Check with your health care provider before starting any calcium supplements.  Try to get most of your calcium from food. What foods can I eat? Grains  Fortified oatmeal. Fortified ready-to-eat cereals. Fortified frozen waffles. Vegetables Turnip greens. Broccoli. Fruits Fortified orange juice. Meats and Other Protein Sources Canned sardines with bones. Canned salmon with bones. Soy beans. Tofu. Baked beans. Almonds. [place] nuts. Sunflower seeds. Dairy Milk. Yogurt. Cheese. Cottage cheese. Beverages Fortified soy milk. Fortified rice milk. Sweets/Desserts Pudding. Ice Cream. Milkshakes. Blackstrap molasses. The items listed above may not be a complete list of recommended foods or beverages. Contact your dietitian for more  options. What foods can affect my calcium intake? It may be more difficult for your body to use calcium or calcium may leave your body more quickly if you consume large amounts of:  Sodium.  Protein.  Caffeine.  Alcohol.  This information is not intended to replace advice given to you by your health care provider. Make  sure you discuss any questions you have with your health care provider. Document Released: 11/21/2003 Document Revised: 10/27/2015 Document Reviewed: 09/14/2013 Elsevier Interactive Patient Education  2018 [place].   Preventing Unhealthy [employer], Adult Staying at a healthy weight is important. When fat builds up in your body, you may become overweight or obese. These conditions put you at greater risk for developing certain health problems, such as heart disease, diabetes, sleeping problems, joint problems, and some cancers. Unhealthy weight gain is often the result of making unhealthy choices in what you eat. It is also a result of not getting enough exercise. You can make changes to your lifestyle to prevent obesity and stay as healthy as possible. What nutrition changes can be made? To maintain a healthy weight and prevent obesity:  Eat only as much as your body needs. To do this: ? Pay attention to signs that you are hungry or full. Stop eating as soon as you feel full. ? If you feel hungry, try drinking water first. Drink enough water so your urine is clear or pale yellow. ? Eat smaller portions. ? Look at serving sizes on food labels. Most foods contain more than one serving per container. ? Eat the recommended amount of calories for your gender and activity level. While most active people should eat around 2,000 calories per day, if you are trying to lose weight or are not very active, you main need to eat less calories. Talk to your health care provider or dietitian about how many calories you should eat each day.  Choose healthy foods, such as: ? Fruits and vegetables. Try to fill at least half of your plate at each meal with fruits and vegetables. ? Whole grains, such as whole wheat bread, brown rice, and quinoa. ? Lean meats, such as chicken or fish. ? Other healthy proteins, such as beans, eggs, or tofu. ? Healthy fats, such as nuts, seeds, fatty fish, and olive  oil. ? Low-fat or fat-free dairy.  Check food labels and avoid food and drinks that: ? Are high in calories. ? Have added sugar. ? Are high in sodium. ? Have saturated fats or trans fats.  Limit how much you eat of the following foods: ? Prepackaged meals. ? Fast food. ? Fried foods. ? Processed meat, such as bacon, sausage, and deli meats. ? Fatty cuts of red meat and poultry with skin.  Cook foods in healthier ways, such as by baking, broiling, or grilling.  When grocery shopping, try to shop around the outside of the store. This helps you buy mostly fresh foods and avoid canned and prepackaged foods.  What lifestyle changes can be made?  Exercise at least 30 minutes 5 or more days each week. Exercising includes brisk walking, yard work, biking, running, swimming, and team sports like basketball and soccer. Ask your health care provider which exercises are safe for you.  Do not use any products that contain nicotine or tobacco, such as cigarettes and e-cigarettes. If you need help quitting, ask your health care provider.  Limit alcohol intake to no more than 1 drink a day for nonpregnant women and 2 drinks  a day for men. One drink equals 12 oz of beer, 5 oz of wine, or 1 oz of hard liquor.  Try to get 7-9 hours of sleep each night. What other changes can be made?  Keep a food and activity journal to keep track of: ? What you ate and how many calories you had. Remember to count sauces, dressings, and side dishes. ? Whether you were active, and what exercises you did. ? Your calorie, weight, and activity goals.  Check your weight regularly. Track any changes. If you notice you have gained weight, make changes to your diet or activity routine.  Avoid taking weight-loss medicines or supplements. Talk to your health care provider before starting any new medicine or supplement.  Talk to your health care provider before trying any new diet or exercise plan. Why are these changes  important? Eating healthy, staying active, and having healthy habits not only help prevent obesity, they also:  Help you to manage stress and emotions.  Help you to connect with friends and family.  Improve your self-esteem.  Improve your sleep.  Prevent long-term health problems.  What can happen if changes are not made? Being obese or overweight can cause you to develop joint or bone problems, which can make it hard for you to stay active or do activities you enjoy. Being obese or overweight also puts stress on your heart and lungs and can lead to health problems like diabetes, heart disease, and some cancers. Where to find more information: Talk with your health care provider or a dietitian about healthy eating and healthy lifestyle choices. You may also find other information through these resources:  U.S. Department of Agriculture MyPlate: FormerBoss.no  American Heart Association: www.heart.org  Centers for Disease Control and Prevention: http://www.wolf.info/  Summary  Staying at a healthy weight is important. It helps prevent certain diseases and health problems, such as heart disease, diabetes, joint problems, sleep disorders, and some cancers.  Being obese or overweight can cause you to develop joint or bone problems, which can make it hard for you to stay active or do activities you enjoy.  You can prevent unhealthy weight gain by eating a healthy diet, exercising regularly, not smoking, limiting alcohol, and getting enough sleep.  Talk with your health care provider or a dietitian for guidance about healthy eating and healthy lifestyle choices. This information is not intended to replace advice given to you by your health care provider. Make sure you discuss any questions you have with your health care provider. Document Released: 04/09/2016 Document Revised: 05/15/2016 Document Reviewed: 05/15/2016 Elsevier Interactive Patient Education  [employer].

## 2016-11-17 LAB — MICROALBUMIN / CREATININE URINE RATIO
Creatinine, Urine: 103.8 mg/dL
Microalb/Creat Ratio: 11.4 mg/g creat (ref 0.0–30.0)
Microalbumin, Urine: 11.8 ug/mL

## 2016-11-18 LAB — COMPREHENSIVE METABOLIC PANEL
ALT: 15 IU/L (ref 0–32)
AST: 10 IU/L (ref 0–40)
Albumin/Globulin Ratio: 1.5 (ref 1.2–2.2)
Albumin: 4.1 g/dL (ref 3.5–5.5)
Alkaline Phosphatase: 59 IU/L (ref 39–117)
BUN/Creatinine Ratio: 13 (ref 9–23)
BUN: 9 mg/dL (ref 6–24)
Bilirubin Total: 0.3 mg/dL (ref 0.0–1.2)
CO2: 25 mmol/L (ref 20–29)
Calcium: 9.8 mg/dL (ref 8.7–10.2)
Chloride: 98 mmol/L (ref 96–106)
Creatinine, Ser: 0.69 mg/dL (ref 0.57–1.00)
GFR calc Af Amer: 117 mL/min/{1.73_m2} (ref >59)
GFR calc non Af Amer: 102 mL/min/{1.73_m2} (ref >59)
Globulin, Total: 2.8 g/dL (ref 1.5–4.5)
Glucose: 146 mg/dL — ABNORMAL HIGH (ref 65–99)
Potassium: 3.7 mmol/L (ref 3.5–5.2)
Sodium: 140 mmol/L (ref 134–144)
Total Protein: 6.9 g/dL (ref 6.0–8.5)

## 2016-11-18 LAB — LIPID PANEL
Chol/HDL Ratio: 4.1 ratio (ref 0.0–4.4)
Cholesterol, Total: 200 mg/dL — ABNORMAL HIGH (ref 100–199)
HDL: 49 mg/dL (ref >39)
LDL Calculated: 128 mg/dL — ABNORMAL HIGH (ref 0–99)
Triglycerides: 113 mg/dL (ref 0–149)
VLDL Cholesterol Cal: 23 mg/dL (ref 5–40)

## 2016-11-18 LAB — CBC
Hematocrit: 35.4 % (ref 34.0–46.6)
Hemoglobin: 11.6 g/dL (ref 11.1–15.9)
MCH: 26.5 pg — ABNORMAL LOW (ref 26.6–33.0)
MCHC: 32.8 g/dL (ref 31.5–35.7)
MCV: 81 fL (ref 79–97)
Platelets: 297 10*3/uL (ref 150–379)
RBC: 4.38 x10E6/uL (ref 3.77–5.28)
RDW: 16.1 % — ABNORMAL HIGH (ref 12.3–15.4)
WBC: 8.6 10*3/uL (ref 3.4–10.8)

## 2016-11-18 LAB — VITAMIN D 25 HYDROXY (VIT D DEFICIENCY, FRACTURES): Vit D, 25-Hydroxy: 27.8 ng/mL — ABNORMAL LOW (ref 30.0–100.0)

## 2016-11-18 LAB — TSH: TSH: 1.7 u[IU]/mL (ref 0.450–4.500)

## 2016-11-19 ENCOUNTER — Telehealth: Payer: Self-pay | Admitting: Family Medicine

## 2016-11-19 NOTE — Telephone Encounter (Signed)
Pt would also like Dr. Brigitte Pulse to know her vertigo is acting up.

## 2016-11-19 NOTE — Telephone Encounter (Signed)
PATIENT JUST HAD HER ANNUAL PHYSICAL WITH DR. SHAW ON Saturday 11/16/16. SHE DISCUSSED WITH DR. SHAW ABOUT HER STOMACH PROBLEMS AND SHE IS WAITING FOR A REFERRAL. SHE WOULD LIKE TO GET DR. SHAW TO FILL OUT SOME FMLA FORMS FOR WHEN SHE GETS HER STOMACH FLARE UPS. SHE GOT A VERY BAD ONE TODAY. SHE WOULD LIKE TO TALK WITH DR. SHAW ABOUT THIS. BEST PHONE [phone] (CELL)  [place]

## 2016-11-20 NOTE — Telephone Encounter (Signed)
Dr Brigitte Pulse, would you like patient to have a follow up appointment to discuss symptoms, or can paperwork be filled out based on past appointments? Please advise, thank you/ S.Latoyia Tecson,CMA

## 2016-11-21 ENCOUNTER — Telehealth: Payer: Self-pay

## 2016-11-21 NOTE — Telephone Encounter (Signed)
Received a request for PA today on Livalo.  Filled out PA through Cover My Meds online, indicating that patient has tried atorvastatin, lovastatin and pravastatin in the past.  She should qualify as a candidate for this medication by the tried and failed medications listed above.  Cover my meds indicated that [employer] should fax an approval or denial within 24 hours.  If not received within a timely manner, I will contact them for decision.

## 2016-11-21 NOTE — Telephone Encounter (Signed)
error 

## 2016-11-22 NOTE — Telephone Encounter (Signed)
Checked cover my meds and patient's request for Livalo has been approved.  They will fax an approval letter at a later date with dates of approval and PA#.  I will notify patient of approval.

## 2016-11-22 NOTE — Telephone Encounter (Signed)
Take the meclizine she has at home for the vertigo - three times a day. Watch for constipation from this.  So assumming she wants intermittent leave for her FMLA due to mixed IBS. How often does she need to be able to take off for flairs (at the MOST frequent).  How long is off work during a [employer], trade union (the LONGEST period she might need). When was she off for this most recent flair?  Should be able to complete w/o a visit

## 2016-11-22 NOTE — Telephone Encounter (Signed)
Spoke with patient and advised that she should take her meclizine that she has TID for her vertigo. When asked about the days that she wants off she said as weird as it sounds that she usually has flare ups every Monday and would like off for possibly 3 days if its possible. She scheduled an appointment for 11/23/16 at 0800.

## 2016-11-22 NOTE — Telephone Encounter (Signed)
Ok. Will discuss then.

## 2016-11-22 NOTE — Addendum Note (Signed)
Addended by: Shawnee Knapp on: 11/22/2016 02:19 PM   Modules accepted: Orders

## 2016-11-23 ENCOUNTER — Encounter: Payer: Self-pay | Admitting: Family Medicine

## 2016-11-23 ENCOUNTER — Other Ambulatory Visit: Payer: Self-pay

## 2016-11-23 ENCOUNTER — Ambulatory Visit (INDEPENDENT_AMBULATORY_CARE_PROVIDER_SITE_OTHER): Payer: 59 | Admitting: Family Medicine

## 2016-11-23 ENCOUNTER — Encounter: Payer: Self-pay | Admitting: Radiology

## 2016-11-23 VITALS — BP 139/87 | HR 86 | Temp 98.4°F | Resp 17 | Ht 70.0 in | Wt 287.2 lb

## 2016-11-23 DIAGNOSIS — K582 Mixed irritable bowel syndrome: Secondary | ICD-10-CM

## 2016-11-23 DIAGNOSIS — R300 Dysuria: Secondary | ICD-10-CM

## 2016-11-23 DIAGNOSIS — E782 Mixed hyperlipidemia: Secondary | ICD-10-CM | POA: Diagnosis not present

## 2016-11-23 DIAGNOSIS — E559 Vitamin D deficiency, unspecified: Secondary | ICD-10-CM

## 2016-11-23 DIAGNOSIS — N3001 Acute cystitis with hematuria: Secondary | ICD-10-CM | POA: Diagnosis not present

## 2016-11-23 LAB — POCT URINALYSIS DIP (MANUAL ENTRY)
Bilirubin, UA: NEGATIVE
Glucose, UA: NEGATIVE mg/dL
Ketones, POC UA: NEGATIVE mg/dL
Nitrite, UA: POSITIVE — AB
Protein Ur, POC: 100 mg/dL — AB
Spec Grav, UA: 1.025 (ref 1.010–1.025)
Urobilinogen, UA: 0.2 E.U./dL
pH, UA: 6.5 (ref 5.0–8.0)

## 2016-11-23 LAB — POC MICROSCOPIC URINALYSIS (UMFC): Mucus: ABSENT

## 2016-11-23 MED ORDER — VITAMIN D (ERGOCALCIFEROL) 1.25 MG (50000 UNIT) PO CAPS: 50000.0000 [IU] | ORAL_CAPSULE | ORAL | 0 refills | Status: DC

## 2016-11-23 MED ORDER — PHENAZOPYRIDINE HCL 200 MG PO TABS: 200.0000 mg | ORAL_TABLET | Freq: Three times a day (TID) | ORAL | 0 refills | Status: DC | PRN

## 2016-11-23 MED ORDER — CEPHALEXIN 500 MG PO CAPS: 500.0000 mg | ORAL_CAPSULE | Freq: Two times a day (BID) | ORAL | 0 refills | Status: DC

## 2016-11-23 NOTE — Progress Notes (Signed)
By signing my name below, I, Maria Sharp, attest that this documentation has been prepared under the direction and in the presence of Maria Cheadle, MD.  Electronically Signed: Verlee Sharp, Medical Scribe. 11/23/16. 8:35 AM.  Subjective:    Patient ID: Maria Sharp, female    DOB: 10/21/1965, 51 y.o.   MRN: [id number]  HPI Chief Complaint  Patient presents with  . Urinary Tract Infection    Starting two days ago with pressure & pain with urination. & frequency  . paperwork completion    HPI Comments: Maria Sharp is a 51 y.o. female who presents to Primary Care at [facility] complaining of UTI and she is here to follow-up on her IBS, diarrhea predominate. For which she has occasional flares that requires absence from work. So need appropriate FMLA paperwork filled.   Pt is taking Vit D 5000 units a week. Pt's has had a cholecystectomy. Pt is fasting.  UTI: Reports abdominal pain when using the bathroom described as excruciating pain at the bottom of her stomach and around her belly button onset 2 days ago. Associated sxs of urinary frequency, She suspects she had a fever and took aleve for her sxs. Pt drank 60 oz of water yesterday and "felt weird". Pt has an appt with Maria Sharp 2 days. Denies back pain, N/V, and urinary incontinence.  FMLA: Pt has been diagnosed with IBS since her late 33s, she also reports gastrophoresis as well. Pt has never been hospitalized for IBS but she has been to the hospital for it. Pt's bowels are fine as long as she hold off food, but when she eats it flares her IBS. She tries to follow the food maps diet and she takes miralax 1x a day for her sxs. She doesn't want to take reglan since she's heard bad reviews. When she has a flare her bowels are a mixture between diarrhea and regular stool. When she has diarrhea she gets dehydrated and feels weak. She's had a constant flare for the past 2 years, and certain days she has bowel incontinence. She typically has 1  day out of the week where she can't leave the house due to frequent bathroom use, and it will occasionally carry over to the next day. Her last flare was 4 days ago, and she couldn't leave the house or eat. Pt hasn't been seen for her sxs due to financial insecurities. Denies bloody stool.  Patient Active Problem List   Diagnosis Date Noted  . Constipation   . Asthma, chronic 09/28/2014  . Gastroparesis   . Varicose veins 09/14/2013  . Neck pain on left side 06/14/2013  . Current non-adherence to medical treatment 06/14/2013  . Vitamin D deficiency 10/05/2012  . Leg cramps 10/05/2012  . Family history of diabetes mellitus 10/05/2012  . 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 ABDOMINAL PAIN, UNSPECIFIED SITE 05/18/2010  . CHEST PAIN, INTERMITTENT 05/12/2009  . Obesity 04/05/2009  . CARDIOMEGALY 04/05/2009  . PALPITATIONS 04/05/2009  . Type 2 diabetes mellitus with peripheral circulatory disorder ([place]) 03/15/2009  . BACK PAIN 03/09/2008  . Hyperlipidemia LDL goal <100 03/03/2008  . Essential hypertension 03/03/2008  . GERD 03/03/2008  . IRRITABLE BOWEL SYNDROME 03/03/2008  . RECTAL BLEEDING 03/03/2008   Past Medical History:  Diagnosis Date  . Allergy   . Asthma   . Diabetes mellitus   . Fatty liver   . Gastritis   . Gastroparesis    study 11 14  nl abd [place]  . GERD (gastroesophageal  reflux disease)   . High cholesterol   . History of varicella   . Hypertension   . IBS (irritable bowel syndrome)    Past Surgical History:  Procedure Laterality Date  . ANAL RECTAL MANOMETRY N/A 04/03/2015   Procedure: ANO RECTAL MANOMETRY;  Surgeon: Mauri Pole, MD;  Location: [facility];  Service: Endoscopy;  Laterality: N/A;  . CHOLECYSTECTOMY    . WISDOM TOOTH EXTRACTION     Allergies  Allergen Reactions  . Ace Inhibitors Anaphylaxis  . Shellfish Allergy Anaphylaxis and Other (See Comments)    Only shrimp  . Januvia [Sitagliptin] Other (See Comments)    Recurrent abdominal  pain with elevated pancreas enzymes  . Latex Itching  . Lipitor [Atorvastatin]    Prior to Admission medications   Medication Sig Start Date End Date Taking? Authorizing Provider  blood glucose meter kit and supplies KIT Dispense based on patient and insurance preference. Use up to four times daily as directed. (FOR ICD-9 250.00, 250.01). 09/26/16  Yes Shawnee Knapp, MD  celecoxib (CELEBREX) 100 MG capsule Take 1 capsule (100 mg total) by mouth 2 (two) times daily. Patient taking differently: Take 100 mg by mouth as needed.  02/19/16  Yes Meredith Pel, MD  Dapagliflozin-Metformin HCl ER (XIGDUO XR) 08-998 MG TB24 Take 1 tablet by mouth daily. 11/16/16  Yes Shawnee Knapp, MD  EPINEPHrine (EPI-PEN) 0.3 mg/0.3 mL DEVI Inject 0.3 mg into the muscle once as needed. For severe allergic reaction   Yes [provider]  olmesartan (BENICAR) 20 MG tablet Take 1 tablet (20 mg total) by mouth daily. 09/26/16  Yes Shawnee Knapp, MD  omeprazole (PRILOSEC) 40 MG capsule Take 1 capsule (40 mg total) by mouth daily. 05/27/16  Yes Shawnee Knapp, MD  Pitavastatin Calcium (LIVALO) 1 MG TABS Take 1 tablet (1 mg total) by mouth daily. 11/16/16  Yes Shawnee Knapp, MD  polyethylene glycol powder (GLYCOLAX/MIRALAX) powder Take 8.5 g by mouth as needed. 02/13/15  Yes Nandigam, Venia Minks, MD  triamterene-hydrochlorothiazide (MAXZIDE-25) 37.5-25 MG tablet Take 1 tablet by mouth daily. 09/26/16  Yes Shawnee Knapp, MD  Vitamin Mixture (VITAMIN E COMPLETE PO) Take by mouth.   Yes [provider]   Social History   Social History  . Marital status: Single    Spouse name: N/A  . Number of children: N/A  . Years of education: N/A   Occupational History  . Not on file.   Social History Main Topics  . Smoking status: Never Smoker  . Smokeless tobacco: Never Used  . Alcohol use No  . Drug use: No  . Sexual activity: Not on file   Other Topics Concern  . Not on file   Social History Narrative   Usually # of  hours of sleep per night: 6   3 of people living at your residence? 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   Irregular sleep 7:30 to 11 or 12 and then 4 to 6:30 before she goes to work   Has attended college.   Originally from [place]   Abnormal pap remote last 2012    Review of Systems  Constitutional: Positive for fever.  Gastrointestinal: Positive for abdominal pain and diarrhea. Negative for blood in stool, nausea and vomiting.  Genitourinary: Positive for frequency.  Musculoskeletal:  Negative for back pain.  Neurological: Positive for weakness (with IBS flares).   Objective:  Physical Exam  Constitutional: She appears well-developed and well-nourished. No distress.  HENT:  Head: Normocephalic and atraumatic.  Eyes: Conjunctivae are normal.  Neck: Neck supple.  Cardiovascular: Normal rate, regular rhythm, S1 normal, S2 normal and normal heart sounds.  Exam reveals no gallop and no friction rub.   No murmur heard. Pulmonary/Chest: Effort normal and breath sounds normal. No respiratory distress. She has no wheezes. She has no rhonchi. She has no rales.  Abdominal: She exhibits no mass. Bowel sounds are increased. There is CVA tenderness (Mild left CVA tenderness). There is no rebound and no guarding.  Neurological: She is alert.  Skin: Skin is warm and dry.  Psychiatric: She has a normal mood and affect. Her behavior is normal.  Nursing note and vitals reviewed.   Vitals:   11/23/16 0812  BP: 139/87  Pulse: 86  Resp: 17  Temp: 98.4 F (36.9 C)  TempSrc: Oral  SpO2: 97%  Weight: 287 lb 4 oz (130.3 kg)  Height: 5' 10"  (1.778 m)  Body mass index is 41.22 kg/m.   Results for orders placed or performed in visit on 11/23/16  POCT urinalysis dipstick  Result Value Ref Range   Color, UA yellow yellow   Clarity, UA hazy (A) clear   Glucose, UA negative  negative mg/dL   Bilirubin, UA negative negative   Ketones, POC UA negative negative mg/dL   Spec Grav, UA 1.025 1.010 - 1.025   Blood, UA large (A) negative   pH, UA 6.5 5.0 - 8.0   Protein Ur, POC =100 (A) negative mg/dL   Urobilinogen, UA 0.2 0.2 or 1.0 E.U./dL   Nitrite, UA Positive (A) Negative   Leukocytes, UA Small (1+) (A) Negative  POCT Microscopic Urinalysis ([facility])  Result Value Ref Range   WBC,UR,HPF,POC Moderate (A) None WBC/hpf   RBC,UR,HPF,POC Many (A) None RBC/hpf   Bacteria Few (A) None, Too numerous to count   Mucus Absent Absent   Epithelial Cells, UR Per Microscopy Few (A) None, Too numerous to count cells/hpf   Assessment & Plan:   1. Dysuria - + UTI  2. Vitamin D deficiency - pt requests high dose rx rather than double otc- will do high dose x 3 mos, then back to otc 5K iu/wk  3. Mixed hyperlipidemia - starting livalo  4. Irritable bowel syndrome with both constipation and diarrhea - w/ gastroparesis - declines to try reglan due to potential side effects.  refuses to try reglan due to potential sie effects. Referral to GI Dr. Mart Piggs as pt reports she has been in constant flair/uncontrolled for 2 yrs. FMLA forms copmleted.    Orders Placed This Encounter  Procedures  . Urine Culture  . POCT urinalysis dipstick  . POCT Microscopic Urinalysis ([facility])    Meds ordered this encounter  Medications  . Vitamin D, Ergocalciferol, (DRISDOL) 50000 units CAPS capsule    Sig: Take 1 capsule (50,000 Units total) by mouth every 7 (seven) days.    Dispense:  12 capsule    Refill:  0  . cephALEXin (KEFLEX) 500 MG capsule    Sig: Take 1 capsule (500 mg total) by mouth 2 (two) times daily.    Dispense:  20 capsule    Refill:  0  . phenazopyridine (PYRIDIUM) 200 MG tablet    Sig: Take 1 tablet (200 mg total) by mouth 3 (three) times daily as needed for  pain.    Dispense:  12 tablet    Refill:  0    I personally performed the services described in this documentation,  which was scribed in my presence. The recorded information has been reviewed and considered, and addended by me as needed.   Maria Sharp, M.D.  Primary Care at [facility] [address] [place], [place] [address] [phone] phone [phone] fax  11/24/16 8:49 PM

## 2016-11-23 NOTE — Telephone Encounter (Signed)
Spoke with pharmacy staff to confirm that rx was received. Confirmed that they were and asked if they would be ready before they closed because patient was anxious. Pharmacist stated that she would try her hardest to have them ready. Patient notified.

## 2016-11-23 NOTE — Telephone Encounter (Signed)
Rx resent to [facility] in [place]

## 2016-11-23 NOTE — Patient Instructions (Addendum)
   IF you received an x-ray today, you will receive an invoice from [facility]. Please contact [facility] at [phone] with questions or concerns regarding your invoice.   IF you received labwork today, you will receive an invoice from [facility]. Please contact [facility] at [phone] with questions or concerns regarding your invoice.   Our billing staff will not be able to assist you with questions regarding bills from these companies.  You will be contacted with the lab results as soon as they are available. The fastest way to get your results is to activate your My Chart account. Instructions are located on the last page of this paperwork. If you have not heard from us regarding the results in 2 weeks, please contact this office.      Urinary Tract Infection, Adult A urinary tract infection (UTI) is an infection of any part of the urinary tract, which includes the kidneys, ureters, bladder, and urethra. These organs make, store, and get rid of urine in the body. UTI can be a bladder infection (cystitis) or kidney infection (pyelonephritis). What are the causes? This infection may be caused by fungi, viruses, or bacteria. Bacteria are the most common cause of UTIs. This condition can also be caused by repeated incomplete emptying of the bladder during urination. What increases the risk? This condition is more likely to develop if:  You ignore your need to urinate or hold urine for long periods of time.  You do not empty your bladder completely during urination.  You wipe back to front after urinating or having a bowel movement, if you are female.  You are uncircumcised, if you are female.  You are constipated.  You have a urinary catheter that stays in place (indwelling).  You have a weak defense (immune) system.  You have a medical condition that affects your bowels, kidneys, or bladder.  You have diabetes.  You take antibiotic medicines frequently or  for long periods of time, and the antibiotics no longer work well against certain types of infections (antibiotic resistance).  You take medicines that irritate your urinary tract.  You are exposed to chemicals that irritate your urinary tract.  You are female.  What are the signs or symptoms? Symptoms of this condition include:  Fever.  Frequent urination or passing small amounts of urine frequently.  Needing to urinate urgently.  Pain or burning with urination.  Urine that smells bad or unusual.  Cloudy urine.  Pain in the lower abdomen or back.  Trouble urinating.  Blood in the urine.  Vomiting or being less hungry than normal.  Diarrhea or abdominal pain.  Vaginal discharge, if you are female.  How is this diagnosed? This condition is diagnosed with a medical history and physical exam. You will also need to provide a urine sample to test your urine. Other tests may be done, including:  Blood tests.  Sexually transmitted disease (STD) testing.  If you have had more than one UTI, a cystoscopy or imaging studies may be done to determine the cause of the infections. How is this treated? Treatment for this condition often includes a combination of two or more of the following:  Antibiotic medicine.  Other medicines to treat less common causes of UTI.  Over-the-counter medicines to treat pain.  Drinking enough water to stay hydrated.  Follow these instructions at home:  Take over-the-counter and prescription medicines only as told by your health care provider.  If you were prescribed an antibiotic, take it as   told by your health care provider. Do not stop taking the antibiotic even if you start to feel better.  Avoid alcohol, caffeine, tea, and carbonated beverages. They can irritate your bladder.  Drink enough fluid to keep your urine clear or pale yellow.  Keep all follow-up visits as told by your health care provider. This is important.  Make sure  to: ? Empty your bladder often and completely. Do not hold urine for long periods of time. ? Empty your bladder before and after sex. ? Wipe from front to back after a bowel movement if you are female. Use each tissue one time when you wipe. Contact a health care provider if:  You have back pain.  You have a fever.  You feel nauseous or vomit.  Your symptoms do not get better after 3 days.  Your symptoms go away and then return. Get help right away if:  You have severe back pain or lower abdominal pain.  You are vomiting and cannot keep down any medicines or water. This information is not intended to replace advice given to you by your health care provider. Make sure you discuss any questions you have with your health care provider. Document Released: 01/16/2005 Document Revised: 09/20/2015 Document Reviewed: 02/27/2015 Elsevier Interactive Patient Education  2017 [employer]. Diet for Irritable Bowel Syndrome When you have irritable bowel syndrome (IBS), the foods you eat and your eating habits are very important. IBS may cause various symptoms, such as abdominal pain, constipation, or diarrhea. Choosing the right foods can help ease discomfort caused by these symptoms. Work with your health care provider and dietitian to find the best eating plan to help control your symptoms. What general guidelines do I need to follow?  Keep a food diary. This will help you identify foods that cause symptoms. Write down: ? What you eat and when. ? What symptoms you have. ? When symptoms occur in relation to your meals.  Avoid foods that cause symptoms. Talk with your dietitian about other ways to get the same nutrients that are in these foods.  Eat more foods that contain fiber. Take a fiber supplement if directed by your dietitian.  Eat your meals slowly, in a relaxed setting.  Aim to eat 5-6 small meals per day. Do not skip meals.  Drink enough fluids to keep your urine clear or pale  yellow.  Ask your health care provider if you should take an over-the-counter probiotic during flare-ups to help restore healthy gut bacteria.  If you have cramping or diarrhea, try making your meals low in fat and high in carbohydrates. Examples of carbohydrates are pasta, rice, whole grain breads and cereals, fruits, and vegetables.  If dairy products cause your symptoms to flare up, try eating less of them. You might be able to handle yogurt better than other dairy products because it contains bacteria that help with digestion. What foods are not recommended? The following are some foods and drinks that may worsen your symptoms:  Fatty foods, such as [place] fries.  Milk products, such as cheese or ice cream.  Chocolate.  Alcohol.  Products with caffeine, such as coffee.  Carbonated drinks, such as soda.  The items listed above may not be a complete list of foods and beverages to avoid. Contact your dietitian for more information. What foods are good sources of fiber? Your health care provider or dietitian may recommend that you eat more foods that contain fiber. Fiber can help reduce constipation and other IBS symptoms.  Add foods with fiber to your diet a little at a time so that your body can get used to them. Too much fiber at once might cause gas and swelling of your abdomen. The following are some foods that are good sources of fiber:  Apples.  Peaches.  Pears.  Berries.  Figs.  Broccoli (raw).  Cabbage.  Carrots.  Raw peas.  Kidney beans.  Lima beans.  Whole grain bread.  Whole grain cereal.  Where to find more information: [employer] for Functional Gastrointestinal Disorders: www.iffgd.[employer] of Diabetes and Digestive and Kidney Diseases: NetworkAffair.co.za.aspx This information is not intended to replace advice given to you by your health care provider.  Make sure you discuss any questions you have with your health care provider. Document Released: 06/29/2003 Document Revised: 09/14/2015 Document Reviewed: 07/09/2013 Elsevier Interactive Patient Education  2018 [employer].

## 2016-11-25 LAB — LIPID PANEL

## 2016-11-25 LAB — CBC

## 2016-11-25 LAB — COMPREHENSIVE METABOLIC PANEL

## 2016-11-25 LAB — VITAMIN D 25 HYDROXY (VIT D DEFICIENCY, FRACTURES)

## 2016-11-25 LAB — TSH

## 2016-11-26 LAB — URINE CULTURE

## 2016-12-05 DIAGNOSIS — R42 Dizziness and giddiness: Secondary | ICD-10-CM | POA: Insufficient documentation

## 2016-12-05 DIAGNOSIS — M26623 Arthralgia of bilateral temporomandibular joint: Secondary | ICD-10-CM | POA: Insufficient documentation

## 2016-12-14 ENCOUNTER — Telehealth: Payer: Self-pay | Admitting: Family Medicine

## 2016-12-14 NOTE — Telephone Encounter (Signed)
Pt called concerning Xigduo 5mg . Pt wants to know if she is taking this properly. She wants to know if she takes it by itself or does she add another metformin with it. Please advise. Thanks! Pt can be contacted at [phone].

## 2016-12-16 DIAGNOSIS — Z0271 Encounter for disability determination: Secondary | ICD-10-CM

## 2016-12-18 ENCOUNTER — Other Ambulatory Visit: Payer: Self-pay

## 2016-12-18 DIAGNOSIS — I83813 Varicose veins of bilateral lower extremities with pain: Secondary | ICD-10-CM

## 2016-12-26 NOTE — Telephone Encounter (Signed)
Call placed to patient regarding her medication concerns, no answer on patient phone. Left message for patient explaining that Xigduo contains metformin ER, and that there is no additional rx for Metformin on her med list. Advised patient in the message to contact this office if medication is not working for her, or if she has additional concerns along with office phone number./ S.Terris Germano,CMA

## 2017-01-29 ENCOUNTER — Encounter (HOSPITAL_COMMUNITY): Payer: Managed Care, Other (non HMO)

## 2017-01-29 ENCOUNTER — Encounter: Payer: Managed Care, Other (non HMO) | Admitting: Vascular Surgery

## 2017-02-03 ENCOUNTER — Encounter: Payer: Managed Care, Other (non HMO) | Admitting: Surgery

## 2017-02-03 ENCOUNTER — Encounter (HOSPITAL_COMMUNITY): Payer: Managed Care, Other (non HMO)

## 2017-02-06 ENCOUNTER — Ambulatory Visit: Payer: 59 | Admitting: Neurology

## 2017-02-16 NOTE — Progress Notes (Signed)
Subjective:    Patient ID: Maria Sharp, female    DOB: [birth year], 51 y.o.   MRN: [id number] Chief Complaint  Patient presents with  . Follow-up    DM , want to make sure UTI is gone has some lower abdomenal discomfort during cycle    HPI  Maria Sharp is a delightful 51 yo woman here for a 3 mo f/u after her CPE to review her DM.  DMII: Diagnosed .   Lab Results  Component Value Date   HGBA1C 7.7 11/16/2016   HGBA1C 6.9 05/27/2016   HGBA1C 7.0 10/03/2015   CBGs: fasting a.m. ; after meal  ; No hypoglycemic episodes.  Meter type:  Diet:  Exercising:  DM Med Regimen: Xigduo XR 08-998 qd Prior changes: 11/16/2016 a1c cont to e slightly above goal on metformin XR 2032m qam - also poss that metformin is irritating her IBS - change to Xigduo  02/17/17: States the xigduo XR might be causing HAs and constipation, difficult to find at the pharmacy.   eGFR: 117. Baseline Cr: 0.69. Last checked 11/16/2016. Microalb: Done 11/16/2016. Normal. On arb- benicar (HISTORY OF ANGIOEDEMA ACEI allergy. HLD:  LDL 128,  non-HDL 151 - 11/16/2016. Goal LDL <100. Agreed to start trial of Livalo 1 mg 3 mos prior after last labs. Not on statin as intolerant to lipitor in 2014 and then pravastatin.  However, never received it in the pharmacy.  Taking asa 81 qd???  Optho: Seen annually by Dr. GDelman Cheadle- last exam 08/2015? Feet: Monofilament exam done 05/2016.   Immunizations:  Influenza: irreg  Pneumovax-23: 2015   Vitamin D def: No improvement on 5K/wk supp so placed on 3 mo course of 50K/wk, then will change back to 5K qwk that she was on prior per pt pref.  HTN: - controlled on olmesartan 20 and maxzide  IBS: Referred to Dr. MCollene Mares Has FMLA due to freq flairs. Prilosec w/ prn miralax.  Obesity:   HAs started this week, and left ear and throat yesterday. Feeling drained for the past 2 months, doesn't want to do anything. Sleep is not restorative, wakes tired. Works at night and sleep is very broken  but has been doing this for the past 25 years.  She goes to bed late and gets up early, then does nap. Occasionally snores.   She starts allergy shots today from Dr. SDonneta Romberg Has chronic sinus pressure and pain with dizziness since June/July.  She is also due to have a root canal aon her left side which correlates with her dizziness. ENT Dr. WWinfield Rastreferred pt to neurology - [facility] - due to poss of MS???  Now thinking dizziness episodes might be due to migraines. Has tinnitus.   Past Medical History:  Diagnosis Date  . Allergy   . Asthma   . Diabetes mellitus   . Fatty liver   . Gastritis   . Gastroparesis    study 11 14  nl [place]d [place] . GERD (gastroesophageal reflux disease)   . High cholesterol   . History of varicella   . Hypertension   . IBS (irritable bowel syndrome)    Past Surgical History:  Procedure Laterality Date  . ANAL RECTAL MANOMETRY N/A 04/03/2015   Procedure: ANO RECTAL MANOMETRY;  Surgeon: KMauri Pole MD;  Location: [facility];  Service: Endoscopy;  Laterality: N/A;  . CHOLECYSTECTOMY    . WISDOM TOOTH EXTRACTION     Current Outpatient Prescriptions on File Prior to Visit  Medication Sig Dispense  Refill  . blood glucose meter kit and supplies KIT Dispense based on patient and insurance preference. Use up to four times daily as directed. (FOR ICD-9 250.00, 250.01). 1 each 0  . EPINEPHrine (EPI-PEN) 0.3 mg/0.3 mL DEVI Inject 0.3 mg into the muscle once as needed. For severe allergic reaction    . olmesartan (BENICAR) 20 MG tablet Take 1 tablet (20 mg total) by mouth daily. 90 tablet 3  . omeprazole (PRILOSEC) 40 MG capsule Take 1 capsule (40 mg total) by mouth daily. 90 capsule 3  . triamterene-hydrochlorothiazide (MAXZIDE-25) 37.5-25 MG tablet Take 1 tablet by mouth daily. 90 tablet 3  . Vitamin Mixture (VITAMIN E COMPLETE PO) Take by mouth.    . [DISCONTINUED] albuterol (PROVENTIL HFA;VENTOLIN HFA) 108 (90 BASE) MCG/ACT inhaler Inhale 2 puffs into the lungs  every 4 (four) hours as needed. 6.7 g 0  . [DISCONTINUED] Azelastine-Fluticasone (DYMISTA) 137-50 MCG/ACT SUSP Place 1 spray into the nose daily. 23 g 0  . [DISCONTINUED] montelukast (SINGULAIR) 10 MG tablet     . [DISCONTINUED] pravastatin (PRAVACHOL) 20 MG tablet Take 1 tablet (20 mg total) by mouth daily. 90 tablet 2   No current facility-administered medications on file prior to visit.    Allergies  Allergen Reactions  . Ace Inhibitors Anaphylaxis  . Shellfish Allergy Anaphylaxis and Other (See Comments)    Only shrimp  . Januvia [Sitagliptin] Other (See Comments)    Recurrent abdominal pain with elevated pancreas enzymes  . Latex Itching  . Lipitor [Atorvastatin]    Family History  Problem Relation Age of Onset  . Prostate cancer Father   . Diabetes Father   . Hypertension Father   . Diabetes Mother   . Heart disease Mother   . Arthritis Mother   . Hyperlipidemia Mother   . Hypertension Mother   . Parkinson's disease Mother   . Thyroid disease Mother   . Diabetes Paternal Grandmother   . Arthritis Paternal Grandmother   . Hyperlipidemia Paternal Grandmother   . Heart disease Paternal Grandmother   . Dementia Paternal Grandmother   . Hypertension Paternal Grandmother   . Hyperlipidemia Maternal Grandmother   . Hypertension Maternal Grandmother   . Hyperlipidemia Maternal Grandfather   . Hypertension Maternal Grandfather   . Hyperlipidemia Paternal Grandfather   . Hypertension Paternal Grandfather   . Diabetes Paternal Grandfather   . Cholelithiasis Paternal Grandfather   . Colon cancer Neg Hx   . Esophageal cancer Neg Hx   . Stomach cancer Neg Hx   . Rectal cancer Neg Hx    Social History   Social History  . Marital status: Single    Spouse name: N/A  . Number of children: N/A  . Years of education: N/A   Social History Main Topics  . Smoking status: Never Smoker  . Smokeless tobacco: Never Used  . Alcohol use No  . Drug use: No  . Sexual activity: Not  Asked   Other Topics Concern  . None   Social History Narrative   Usually # of hours of sleep per night: 6   3 of people living at your residence? 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   Irregular sleep 7:30 to 11 or 12 and then 4 to 6:30 before she goes to work   Has attended college.   Originally from [place].  G0P0   Abnormal pap remote last 2012    Depression screen [facility] 2/9 02/17/2017 11/23/2016 11/16/2016 09/26/2016 05/27/2016  Decreased Interest 0 0 0 0 0  Down, Depressed, Hopeless 0 0 0 0 0  PHQ - 2 Score 0 0 0 0 0    Review of Systems  Constitutional: Positive for fatigue. Negative for appetite change, chills, diaphoresis and fever.  Eyes: Negative for visual disturbance.  Respiratory: Negative for cough and shortness of breath.   Cardiovascular: Negative for chest pain, palpitations and leg swelling.  Genitourinary: Negative for decreased urine volume.  Neurological: Negative for syncope and headaches.  Hematological: Does not bruise/bleed easily.       Objective:   Physical Exam  Constitutional: She is oriented to person, place, and time. She appears well-developed and well-nourished. No distress.  HENT:  Head: Normocephalic and atraumatic.  Right Ear: External ear normal.  Left Ear: External ear normal.  Eyes: Conjunctivae are normal. No scleral icterus.  Neck: Normal range of motion. Neck supple. No thyromegaly present.  Cardiovascular: Normal rate, regular rhythm, normal heart sounds and intact distal pulses.   Pulmonary/Chest: Effort normal and breath sounds normal. No respiratory distress.  Musculoskeletal: She exhibits no edema.  Lymphadenopathy:    She has no cervical adenopathy.  Neurological: She is alert and oriented to person, place, and time.  Skin: Skin is warm and dry. She is not diaphoretic. No erythema.  Psychiatric: She has a normal  mood and affect. Her behavior is normal.      BP (!) 124/92   Pulse 79   Temp 98.6 F (37 C)   Resp 16   Ht _0  (1.778 m)   Wt 280 lb 3.2 oz (127.1 kg)   SpO2 98%   BMI 40.20 kg/m   Assessment & Plan:  Update optho exam A1c, cmp - add ck if any myalgias from statin.  Can double up on xigduo 08/998 she is on or come in 01/999. Flu shot F/u 3 mos for fastling labs to check dose of livalo. Check vit D at next visit.  1. Type 2 diabetes mellitus with peripheral circulatory disorder ([place])   2. Essential hypertension   3. Hyperlipidemia LDL goal <100   4. Class 3 severe obesity due to excess calories with serious comorbidity and body mass index (BMI) of 40.0 to 44.9 in adult ([place])   5. Vitamin D deficiency   6. Medication monitoring encounter   7. Left ear impacted cerumen   8. Need for prophylactic vaccination and inoculation against influenza     Orders Placed This Encounter  Procedures  . Flu Vaccine QUAD 6+ mos PF IM (Fluarix Quad PF)  . Comprehensive metabolic panel  . POCT glycosylated hemoglobin (Hb A1C)  . POCT urinalysis dipstick  . POCT Microscopic Urinalysis ([facility])    Meds ordered this encounter  Medications  . Pitavastatin Calcium (LIVALO) 1 MG TABS    Sig: Take 1 tablet (1 mg total) by mouth daily.    Dispense:  90 tablet    Refill:  0  . metFORMIN (GLUCOPHAGE-XR) 500 MG 24 hr tablet    Sig: Take 4 tablets (2,000 mg total) by mouth daily with breakfast.    [name], M.D.  Primary Care at [facility] [address] [place], [place] [address] [phone] phone [phone] fax  02/19/17 10:05 PM

## 2017-02-17 ENCOUNTER — Ambulatory Visit (INDEPENDENT_AMBULATORY_CARE_PROVIDER_SITE_OTHER): Payer: 59 | Admitting: Family Medicine

## 2017-02-17 ENCOUNTER — Encounter: Payer: Self-pay | Admitting: Family Medicine

## 2017-02-17 VITALS — BP 124/92 | HR 79 | Temp 98.6°F | Resp 16 | Ht 70.0 in | Wt 280.2 lb

## 2017-02-17 DIAGNOSIS — Z6841 Body Mass Index (BMI) 40.0 and over, adult: Secondary | ICD-10-CM | POA: Diagnosis not present

## 2017-02-17 DIAGNOSIS — Z5181 Encounter for therapeutic drug level monitoring: Secondary | ICD-10-CM | POA: Diagnosis not present

## 2017-02-17 DIAGNOSIS — I10 Essential (primary) hypertension: Secondary | ICD-10-CM | POA: Diagnosis not present

## 2017-02-17 DIAGNOSIS — H6122 Impacted cerumen, left ear: Secondary | ICD-10-CM | POA: Diagnosis not present

## 2017-02-17 DIAGNOSIS — E785 Hyperlipidemia, unspecified: Secondary | ICD-10-CM

## 2017-02-17 DIAGNOSIS — Z23 Encounter for immunization: Secondary | ICD-10-CM | POA: Diagnosis not present

## 2017-02-17 DIAGNOSIS — E1151 Type 2 diabetes mellitus with diabetic peripheral angiopathy without gangrene: Secondary | ICD-10-CM | POA: Diagnosis not present

## 2017-02-17 DIAGNOSIS — E559 Vitamin D deficiency, unspecified: Secondary | ICD-10-CM | POA: Diagnosis not present

## 2017-02-17 LAB — POCT URINALYSIS DIP (MANUAL ENTRY)
Bilirubin, UA: NEGATIVE
Glucose, UA: 100 mg/dL — AB
Ketones, POC UA: NEGATIVE mg/dL
Nitrite, UA: NEGATIVE
Protein Ur, POC: NEGATIVE mg/dL
Spec Grav, UA: 1.025 (ref 1.010–1.025)
Urobilinogen, UA: 0.2 E.U./dL
pH, UA: 7 (ref 5.0–8.0)

## 2017-02-17 LAB — POC MICROSCOPIC URINALYSIS (UMFC): Mucus: ABSENT

## 2017-02-17 LAB — POCT GLYCOSYLATED HEMOGLOBIN (HGB A1C): Hemoglobin A1C: 7.1

## 2017-02-17 MED ORDER — METFORMIN HCL ER 500 MG PO TB24: 2000.0000 mg | ORAL_TABLET | Freq: Every day | ORAL | Status: DC

## 2017-02-17 MED ORDER — PITAVASTATIN CALCIUM 1 MG PO TABS: 1.0000 mg | ORAL_TABLET | Freq: Every day | ORAL | 0 refills | Status: DC

## 2017-02-17 NOTE — Patient Instructions (Addendum)
Change back to your metformin - let me know how you are doing in 2 weeks - make sure the constipation, nausea, and headaches are gone. Discuss the possibility of sleep apnea at your neurology visit. Take a baby aspirin daiy. Make sure you have seen your eye doctor in the past year. In 2-4 wks, after we have you back on your diabetic medication and you are tolerating it well, start the livalo (pitavastatin). Start your over-the-counter vitamin D - about 15000u/wk total.   IF you received an x-ray today, you will receive an invoice from [facility]. Please contact [facility] at [phone] with questions or concerns regarding your invoice.   IF you received labwork today, you will receive an invoice from [facility]. Please contact [facility] at [phone] with questions or concerns regarding your invoice.   Our billing staff will not be able to assist you with questions regarding bills from these companies.  You will be contacted with the lab results as soon as they are available. The fastest way to get your results is to activate your My Chart account. Instructions are located on the last page of this paperwork. If you have not heard from [place] regarding the results in 2 weeks, please contact this office.     Exercising to Lose Weight Exercising can help you to lose weight. In order to lose weight through exercise, you need to do vigorous-intensity exercise. You can tell that you are exercising with vigorous intensity if you are breathing very hard and fast and cannot hold a conversation while exercising. Moderate-intensity exercise helps to maintain your current weight. You can tell that you are exercising at a moderate level if you have a higher heart rate and faster breathing, but you are still able to hold a conversation. How often should I exercise? Choose an activity that you enjoy and set realistic goals. Your health care provider can help you to make an activity plan that  works for you. Exercise regularly as directed by your health care provider. This may include:  Doing resistance training twice each week, such as: ? Push-ups. ? Sit-ups. ? Lifting weights. ? Using resistance bands.  Doing a given intensity of exercise for a given amount of time. Choose from these options: ? 150 minutes of moderate-intensity exercise every week. ? 75 minutes of vigorous-intensity exercise every week. ? A mix of moderate-intensity and vigorous-intensity exercise every week.  Children, pregnant women, people who are out of shape, people who are overweight, and older adults may need to consult a health care provider for individual recommendations. If you have any sort of medical condition, be sure to consult your health care provider before starting a new exercise program. What are some activities that can help me to lose weight?  Walking at a rate of at least 4.5 miles an hour.  Jogging or running at a rate of 5 miles per hour.  Biking at a rate of at least 10 miles per hour.  Lap swimming.  Roller-skating or in-line skating.  Cross-country skiing.  Vigorous competitive sports, such as football, basketball, and soccer.  Jumping rope.  Aerobic dancing. How can I be more active in my day-to-day activities?  Use the stairs instead of the elevator.  Take a walk during your lunch break.  If you drive, park your car farther away from work or school.  If you take public transportation, get off one stop early and walk the rest of the way.  Make all of your phone calls  while standing up and walking around.  Get up, stretch, and walk around every 30 minutes throughout the day. What guidelines should I follow while exercising?  Do not exercise so much that you hurt yourself, feel dizzy, or get very short of breath.  Consult your health care provider prior to starting a new exercise program.  Wear comfortable clothes and shoes with good support.  Drink plenty  of water while you exercise to prevent dehydration or heat stroke. Body water is lost during exercise and must be replaced.  Work out until you breathe faster and your heart beats faster. This information is not intended to replace advice given to you by your health care provider. Make sure you discuss any questions you have with your health care provider. Document Released: 05/11/2010 Document Revised: 09/14/2015 Document Reviewed: 09/09/2013 Elsevier Interactive Patient Education  2018 [place].  Sleep Apnea Sleep apnea is a condition in which breathing pauses or becomes shallow during sleep. Episodes of sleep apnea usually last 10 seconds or longer, and they may occur as many as 20 times an hour. Sleep apnea disrupts your sleep and keeps your body from getting the rest that it needs. This condition can increase your risk of certain health problems, including:  Heart attack.  Stroke.  Obesity.  Diabetes.  Heart failure.  Irregular heartbeat.  There are three kinds of sleep apnea:  Obstructive sleep apnea. This kind is caused by a blocked or collapsed airway.  Central sleep apnea. This kind happens when the part of the brain that controls breathing does not send the correct signals to the muscles that control breathing.  Mixed sleep apnea. This is a combination of obstructive and central sleep apnea.  What are the causes? The most common cause of this condition is a collapsed or blocked airway. An airway can collapse or become blocked if:  Your throat muscles are abnormally relaxed.  Your tongue and tonsils are larger than normal.  You are overweight.  Your airway is smaller than normal.  What increases the risk? This condition is more likely to develop in people who:  Are overweight.  Smoke.  Have a smaller than normal airway.  Are elderly.  Are female.  Drink alcohol.  Take sedatives or tranquilizers.  Have a family history of sleep apnea.  What are  the signs or symptoms? Symptoms of this condition include:  Trouble staying asleep.  Daytime sleepiness and tiredness.  Irritability.  Loud snoring.  Morning headaches.  Trouble concentrating.  Forgetfulness.  Decreased interest in sex.  Unexplained sleepiness.  Mood swings.  Personality changes.  Feelings of depression.  Waking up often during the night to urinate.  Dry mouth.  Sore throat.  How is this diagnosed? This condition may be diagnosed with:  A medical history.  A physical exam.  A series of tests that are done while you are sleeping (sleep study). These tests are usually done in a sleep lab, but they may also be done at home.  How is this treated? Treatment for this condition aims to restore normal breathing and to ease symptoms during sleep. It may involve managing health issues that can affect breathing, such as high blood pressure or obesity. Treatment may include:  Sleeping on your side.  Using a decongestant if you have nasal congestion.  Avoiding the use of depressants, including alcohol, sedatives, and narcotics.  Losing weight if you are overweight.  Making changes to your diet.  Quitting smoking.  Using a device to open  your airway while you sleep, such as: ? An oral appliance. This is a custom-made mouthpiece that shifts your lower jaw forward. ? A continuous positive airway pressure (CPAP) device. This device delivers oxygen to your airway through a mask. ? A nasal expiratory positive airway pressure (EPAP) device. This device has valves that you put into each nostril. ? A bi-level positive airway pressure (BPAP) device. This device delivers oxygen to your airway through a mask.  Surgery if other treatments do not work. During surgery, excess tissue is removed to create a wider airway.  It is important to get treatment for sleep apnea. Without treatment, this condition can lead to:  High blood pressure.  Coronary artery  disease.  (Men) An inability to achieve or maintain an erection (impotence).  Reduced thinking abilities.  Follow these instructions at home:  Make any lifestyle changes that your health care provider recommends.  Eat a healthy, well-balanced diet.  Take over-the-counter and prescription medicines only as told by your health care provider.  Avoid using depressants, including alcohol, sedatives, and narcotics.  Take steps to lose weight if you are overweight.  If you were given a device to open your airway while you sleep, use it only as told by your health care provider.  Do not use any tobacco products, such as cigarettes, chewing tobacco, and e-cigarettes. If you need help quitting, ask your health care provider.  Keep all follow-up visits as told by your health care provider. This is important. Contact a health care provider if:  The device that you received to open your airway during sleep is uncomfortable or does not seem to be working.  Your symptoms do not improve.  Your symptoms get worse. Get help right away if:  You develop chest pain.  You develop shortness of breath.  You develop discomfort in your back, arms, or stomach.  You have trouble speaking.  You have weakness on one side of your body.  You have drooping in your face. These symptoms may represent a serious problem that is an emergency. Do not wait to see if the symptoms will go away. Get medical help right away. Call your local emergency services (911 in the [place] Do not drive yourself to the hospital. This information is not intended to replace advice given to you by your health care provider. Make sure you discuss any questions you have with your health care provider. Document Released: 03/29/2002 Document Revised: 12/03/2015 Document Reviewed: 01/16/2015 Elsevier Interactive Patient Education  2018 [employer].   Fatigue Fatigue is feeling tired all of the time, a lack of energy, or a lack  of motivation. Occasional or mild fatigue is often a normal response to activity or life in general. However, long-lasting (chronic) or extreme fatigue may indicate an underlying medical condition. Follow these instructions at home: Watch your fatigue for any changes. The following actions may help to lessen any discomfort you are feeling:  Talk to your health care provider about how much sleep you need each night. Try to get the required amount every night.  Take medicines only as directed by your health care provider.  Eat a healthy and nutritious diet. Ask your health care provider if you need help changing your diet.  Drink enough fluid to keep your urine clear or pale yellow.  Practice ways of relaxing, such as yoga, meditation, massage therapy, or acupuncture.  Exercise regularly.  Change situations that cause you stress. Try to keep your work and personal routine reasonable.  Do not abuse illegal drugs.  Limit alcohol intake to no more than 1 drink per day for nonpregnant women and 2 drinks per day for men. One drink equals 12 ounces of beer, 5 ounces of wine, or 1 ounces of hard liquor.  Take a multivitamin, if directed by your health care provider.  Contact a health care provider if:  Your fatigue does not get better.  You have a fever.  You have unintentional weight loss or gain.  You have headaches.  You have difficulty: ? Falling asleep. ? Sleeping throughout the night.  You feel angry, guilty, anxious, or sad.  You are unable to have a bowel movement (constipation).  You skin is dry.  Your legs or another part of your body is swollen. Get help right away if:  You feel confused.  Your vision is blurry.  You feel faint or pass out.  You have a severe headache.  You have severe abdominal, pelvic, or back pain.  You have chest pain, shortness of breath, or an irregular or fast heartbeat.  You are unable to urinate or you urinate less than  normal.  You develop abnormal bleeding, such as bleeding from the rectum, vagina, nose, lungs, or nipples.  You vomit blood.  You have thoughts about harming yourself or committing suicide.  You are worried that you might harm someone else. This information is not intended to replace advice given to you by your health care provider. Make sure you discuss any questions you have with your health care provider. Document Released: 02/03/2007 Document Revised: 09/14/2015 Document Reviewed: 08/10/2013 Elsevier Interactive Patient Education  [employer].

## 2017-02-18 LAB — COMPREHENSIVE METABOLIC PANEL
ALT: 11 IU/L (ref 0–32)
AST: 8 IU/L (ref 0–40)
Albumin/Globulin Ratio: 1.4 (ref 1.2–2.2)
Albumin: 4.2 g/dL (ref 3.5–5.5)
Alkaline Phosphatase: 64 IU/L (ref 39–117)
BUN/Creatinine Ratio: 16 (ref 9–23)
BUN: 12 mg/dL (ref 6–24)
Bilirubin Total: 0.3 mg/dL (ref 0.0–1.2)
CO2: 28 mmol/L (ref 20–29)
Calcium: 9.9 mg/dL (ref 8.7–10.2)
Chloride: 98 mmol/L (ref 96–106)
Creatinine, Ser: 0.75 mg/dL (ref 0.57–1.00)
GFR calc Af Amer: 107 mL/min/{1.73_m2} (ref >59)
GFR calc non Af Amer: 93 mL/min/{1.73_m2} (ref >59)
Globulin, Total: 3.1 g/dL (ref 1.5–4.5)
Glucose: 110 mg/dL — ABNORMAL HIGH (ref 65–99)
Potassium: 3.8 mmol/L (ref 3.5–5.2)
Sodium: 141 mmol/L (ref 134–144)
Total Protein: 7.3 g/dL (ref 6.0–8.5)

## 2017-03-21 ENCOUNTER — Encounter: Payer: Self-pay | Admitting: Podiatry

## 2017-03-21 ENCOUNTER — Ambulatory Visit: Payer: 59 | Admitting: Podiatry

## 2017-03-21 DIAGNOSIS — B351 Tinea unguium: Secondary | ICD-10-CM | POA: Diagnosis not present

## 2017-03-21 DIAGNOSIS — M79609 Pain in unspecified limb: Secondary | ICD-10-CM | POA: Diagnosis not present

## 2017-03-21 NOTE — Progress Notes (Addendum)
Complaint:  Visit Type: Patient returns to my office for continued preventative foot care services. Complaint: Patient states" my nails have grown long and thick and become painful to walk and wear shoes" Patient has been diagnosed with DM with no foot complications. The patient presents for preventative foot care services. No changes to ROS  Podiatric Exam: Vascular: dorsalis pedis and posterior tibial pulses are palpable bilateral. Capillary return is immediate. Temperature gradient is WNL. Skin turgor WNL  Sensorium: Normal Semmes Weinstein monofilament test. Normal tactile sensation bilaterally. Nail Exam: Pt has thick disfigured discolored nails with subungual debris noted bilateral entire nail hallux through fifth toenails Ulcer Exam: There is no evidence of ulcer or pre-ulcerative changes or infection. Orthopedic Exam: Muscle tone and strength are WNL. No limitations in general ROM. No crepitus or effusions noted. Foot type and digits show no abnormalities. Bony prominences are unremarkable. Skin: No Porokeratosis. No infection or ulcers  Diagnosis:  Onychomycosis, , Pain in right toe, pain in left toes  Treatment & Plan Procedures and Treatment: Consent by patient was obtained for treatment procedures.   Debridement of mycotic and hypertrophic toenails, 1 through 5 bilateral and clearing of subungual debris. No ulceration, no infection noted. Dispense plantar fascial brace.Return Visit-Office Procedure: Patient instructed to return to the office for a follow up visit 3 months for continued evaluation and treatment.    Gardiner Barefoot DPM

## 2017-03-24 ENCOUNTER — Encounter (HOSPITAL_COMMUNITY): Payer: 59

## 2017-03-24 ENCOUNTER — Encounter: Payer: Self-pay | Admitting: Surgery

## 2017-03-31 ENCOUNTER — Ambulatory Visit: Payer: 59 | Admitting: Neurology

## 2017-05-07 ENCOUNTER — Ambulatory Visit: Payer: 59 | Admitting: Podiatry

## 2017-05-07 ENCOUNTER — Other Ambulatory Visit: Payer: Self-pay | Admitting: Obstetrics & Gynecology

## 2017-05-07 DIAGNOSIS — Z1231 Encounter for screening mammogram for malignant neoplasm of breast: Secondary | ICD-10-CM

## 2017-05-08 ENCOUNTER — Encounter: Payer: Self-pay | Admitting: Podiatry

## 2017-05-08 ENCOUNTER — Other Ambulatory Visit: Payer: Self-pay | Admitting: Podiatry

## 2017-05-08 ENCOUNTER — Ambulatory Visit (INDEPENDENT_AMBULATORY_CARE_PROVIDER_SITE_OTHER): Payer: 59

## 2017-05-08 ENCOUNTER — Ambulatory Visit: Payer: 59 | Admitting: Podiatry

## 2017-05-08 DIAGNOSIS — L600 Ingrowing nail: Secondary | ICD-10-CM

## 2017-05-08 DIAGNOSIS — M722 Plantar fascial fibromatosis: Secondary | ICD-10-CM

## 2017-05-08 DIAGNOSIS — M79672 Pain in left foot: Secondary | ICD-10-CM

## 2017-05-08 MED ORDER — TRIAMCINOLONE ACETONIDE 10 MG/ML IJ SUSP
10.0000 mg | Freq: Once | INTRAMUSCULAR | Status: AC
  Administered 2017-05-08: 10 mg

## 2017-05-08 NOTE — Progress Notes (Signed)
Subjective:   Patient ID: Maria Sharp, female   DOB: 52 y.o.   MRN: [id number]   HPI Patient presents stating she has had some bruising in her left arch and is been somewhat sore and also has a lesion and she is not sure if that is part of it.  Does not remember specific injury   ROS      Objective:  Physical Exam  Neurovascular status unchanged with inflammation pain of the left distal fascia before it inserts into the head of the first metatarsal with pain fluid buildup noted     Assessment:  Probability for fascial injury left with patient also having ingrown toenail deformity     Plan:  Reviewed both conditions and at one point ingrown toenails may need to be addressed.  Today I went ahead and I did debride out a quarter take pressure off and I carefully injected the mid arch distal portion 3 mg Kenalog 5 mg Xylocaine.  X-rays indicate that there is no indications of traumatic injury of the medial side of the left foot

## 2017-05-13 ENCOUNTER — Ambulatory Visit
Admission: RE | Admit: 2017-05-13 | Discharge: 2017-05-13 | Disposition: A | Discharge status: Home / Self Care | Payer: 59 | Source: Ambulatory Visit | Attending: Obstetrics & Gynecology | Admitting: Obstetrics & Gynecology

## 2017-05-13 DIAGNOSIS — Z1231 Encounter for screening mammogram for malignant neoplasm of breast: Secondary | ICD-10-CM

## 2017-05-13 LAB — HM MAMMOGRAPHY

## 2017-05-19 ENCOUNTER — Ambulatory Visit (HOSPITAL_COMMUNITY)
Admission: RE | Admit: 2017-05-19 | Discharge: 2017-05-19 | Disposition: A | Discharge status: Home / Self Care | Payer: 59 | Source: Ambulatory Visit | Attending: Surgery | Admitting: Surgery

## 2017-05-19 ENCOUNTER — Encounter: Payer: Self-pay | Admitting: Surgery

## 2017-05-19 ENCOUNTER — Ambulatory Visit: Payer: 59 | Admitting: Surgery

## 2017-05-19 VITALS — BP 152/93 | HR 90 | Temp 97.3°F | Resp 16 | Ht 70.0 in | Wt 279.0 lb

## 2017-05-19 DIAGNOSIS — I83893 Varicose veins of bilateral lower extremities with other complications: Secondary | ICD-10-CM

## 2017-05-19 DIAGNOSIS — I872 Venous insufficiency (chronic) (peripheral): Secondary | ICD-10-CM | POA: Diagnosis not present

## 2017-05-19 DIAGNOSIS — I83813 Varicose veins of bilateral lower extremities with pain: Secondary | ICD-10-CM | POA: Diagnosis not present

## 2017-05-19 DIAGNOSIS — I868 Varicose veins of other specified sites: Secondary | ICD-10-CM

## 2017-05-19 NOTE — Progress Notes (Signed)
[facility]  Patient name: Maria Sharp MRN: [id number] DOB: 13-Jul-1965 Sex: female   REQUESTING PROVIDER:    Dr. Brigitte Pulse   REASON FOR CONSULT:    Varicose veins  HISTORY OF PRESENT ILLNESS:   Maria Sharp is a 52 y.o. female, who is here today for evaluation of varicose veins.  She states that she has been having difficulty for many years.  She has a history of a vein procedure on a varicose vein approximately 10 years ago at the time of laparoscopic cholecystectomy.  She has also been evaluated by [facility].  She states that when she is on her feet for long period of time that she gets pain in both legs.  She also gets swelling down at her ankles.  She denies a history of DVT.  She denies any history of bleeding.  She has occasionally worn compression stockings in the past.  She has never had an ulcer.  She states that her mom has similar problems.  Patient suffers from diabetes.  She takes a statin for hypercholesterolemia.  She is medically managed for hypertension.  She is a non-smoker  PAST MEDICAL HISTORY    Past Medical History:  Diagnosis Date  . Allergy   . Asthma   . Diabetes mellitus   . Fatty liver   . Gastritis   . Gastroparesis    study 11 14  nl abd [place]  . GERD (gastroesophageal reflux disease)   . High cholesterol   . History of varicella   . Hypertension   . IBS (irritable bowel syndrome)      FAMILY HISTORY   Family History  Problem Relation Age of Onset  . Prostate cancer Father   . Diabetes Father   . Hypertension Father   . Diabetes Mother   . Heart disease Mother   . Arthritis Mother   . Hyperlipidemia Mother   . Hypertension Mother   . Parkinson's disease Mother   . Thyroid disease Mother   . Diabetes Paternal Grandmother   . Arthritis Paternal Grandmother   . Hyperlipidemia Paternal Grandmother   . Heart disease Paternal Grandmother   . Dementia Paternal  Grandmother   . Hypertension Paternal Grandmother   . Hyperlipidemia Maternal Grandmother   . Hypertension Maternal Grandmother   . Hyperlipidemia Maternal Grandfather   . Hypertension Maternal Grandfather   . Hyperlipidemia Paternal Grandfather   . Hypertension Paternal Grandfather   . Diabetes Paternal Grandfather   . Cholelithiasis Paternal Grandfather   . Colon cancer Neg Hx   . Esophageal cancer Neg Hx   . Stomach cancer Neg Hx   . Rectal cancer Neg Hx     SOCIAL HISTORY:   Social History   Socioeconomic History  . Marital status: Single    Spouse name: Not on file  . Number of children: Not on file  . Years of education: Not on file  . Highest education level: Not on file  Social Needs  . Financial resource strain: Not on file  . Food insecurity - worry: Not on file  . Food insecurity - inability: Not on file  . Transportation needs - medical: Not on file  . Transportation needs - non-medical: Not on file  Occupational History  . Not on file  Tobacco Use  . Smoking status: Never Smoker  . Smokeless tobacco: Never Used  Substance and Sexual Activity  . Alcohol use: No  . Drug use: No  . Sexual  activity: Not on file  Other Topics Concern  . Not on file  Social History Narrative   Usually # of hours of sleep per night: 6   3 of people living at your residence? 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   Irregular sleep 7:30 to 11 or 12 and then 4 to 6:30 before she goes to work   Has attended college.   Originally from [place]   Abnormal pap remote last 2012     ALLERGIES:    Allergies  Allergen Reactions  . Ace Inhibitors Anaphylaxis  . Shellfish Allergy Anaphylaxis and Other (See Comments)    Only shrimp  . Januvia [Sitagliptin] Other (See Comments)    Recurrent abdominal pain with elevated pancreas enzymes  . Latex Itching  .  Lipitor [Atorvastatin]     CURRENT MEDICATIONS:    Current Outpatient Medications  Medication Sig Dispense Refill  . blood glucose meter kit and supplies KIT Dispense based on patient and insurance preference. Use up to four times daily as directed. (FOR ICD-9 250.00, 250.01). 1 each 0  . EPINEPHrine (EPI-PEN) 0.3 mg/0.3 mL DEVI Inject 0.3 mg into the muscle once as needed. For severe allergic reaction    . metFORMIN (GLUCOPHAGE-XR) 500 MG 24 hr tablet Take 4 tablets (2,000 mg total) by mouth daily with breakfast.    . olmesartan (BENICAR) 20 MG tablet Take 1 tablet (20 mg total) by mouth daily. 90 tablet 3  . omeprazole (PRILOSEC) 40 MG capsule Take 1 capsule (40 mg total) by mouth daily. 90 capsule 3  . Pitavastatin Calcium (LIVALO) 1 MG TABS Take 1 tablet (1 mg total) by mouth daily. 90 tablet 0  . triamterene-hydrochlorothiazide (MAXZIDE-25) 37.5-25 MG tablet Take 1 tablet by mouth daily. 90 tablet 3  . Vitamin D, Ergocalciferol, (DRISDOL) 50000 units CAPS capsule Take 50,000 Units by mouth every 7 (seven) days.    . Vitamin Mixture (VITAMIN E COMPLETE PO) Take by mouth.     No current facility-administered medications for this visit.     REVIEW OF SYSTEMS:   [X]  denotes positive finding, [ ]  denotes negative finding Cardiac  Comments:  Chest pain or chest pressure:    Shortness of breath upon exertion:    Short of breath when lying flat:    Irregular heart rhythm:        Vascular    Pain in calf, thigh, or hip brought on by ambulation:    Pain in feet at night that wakes you up from your sleep:     Blood clot in your veins:    Leg swelling:  x       Pulmonary    Oxygen at home:    Productive cough:     Wheezing:         Neurologic    Sudden weakness in arms or legs:     Sudden numbness in arms or legs:     Sudden onset of difficulty speaking or slurred speech:    Temporary loss of vision in one eye:     Problems with dizziness:         Gastrointestinal    Blood  in stool:      Vomited blood:         Genitourinary    Burning when urinating:     Blood  in urine:        Psychiatric    Major depression:         Hematologic    Bleeding problems:    Problems with blood clotting too easily:        Skin    Rashes or ulcers:        Constitutional    Fever or chills:     PHYSICAL EXAM:   Vitals:   05/19/17 1308  BP: (!) 152/93  Pulse: 90  Resp: 16  Temp: (!) 97.3 F (36.3 C)  TempSrc: Oral  SpO2: 96%  Weight: 279 lb (126.6 kg)  Height: 5' 10"  (1.778 m)    GENERAL: The patient is a well-nourished female, in no acute distress. The vital signs are documented above. CARDIAC: There is a regular rate and rhythm.  VASCULAR: Trace edema bilaterally.  Multiple surface veins are present and prominent with varicosities along the medial aspect of the right leg and one large varix on the right lateral leg at the level of the knee. PULMONARY: Nonlabored respirations MUSCULOSKELETAL: There are no major deformities or cyanosis. NEUROLOGIC: No focal weakness or paresthesias are detected. SKIN: There are no ulcers or rashes noted. PSYCHIATRIC: The patient has a normal affect.  STUDIES:   I have ordered and reviewed her vascular lab study.  This shows significant reflux in the right common femoral and great saphenous vein.  Maximum vein diameter in the right saphenous vein is 8.3 mm at the saphenofemoral junction.  There is reflux within the left saphenous vein with maximum diameter of 4.6 mm.  ASSESSMENT and PLAN   Varicose veins, bilateral leg: We discussed the possibility of endovenous laser ablation of her saphenous veins, beginning on the right which appears to be her more symptomatic side.  She could also undergo stab phlebectomy or sclerotherapy of the surface veins.  Prior to proceeding, she will have an attempt at medical management which will consist of thigh-high 20-30 mm compression stockings and leg elevations, plus ibuprofen for her pain.   She will follow-up in 3 months to see how she is doing.   Annamarie Major, MD [facility] [phone] Pager [phone]

## 2017-05-29 ENCOUNTER — Other Ambulatory Visit: Payer: Self-pay | Admitting: Family Medicine

## 2017-05-30 ENCOUNTER — Ambulatory Visit: Payer: 59 | Admitting: Neurology

## 2017-06-02 ENCOUNTER — Ambulatory Visit: Payer: 59 | Admitting: Neurology

## 2017-06-16 ENCOUNTER — Ambulatory Visit: Payer: 59 | Admitting: Podiatry

## 2017-06-17 ENCOUNTER — Ambulatory Visit (INDEPENDENT_AMBULATORY_CARE_PROVIDER_SITE_OTHER): Payer: 59 | Admitting: Sports Medicine

## 2017-06-17 ENCOUNTER — Encounter: Payer: Self-pay | Admitting: Sports Medicine

## 2017-06-17 DIAGNOSIS — I739 Peripheral vascular disease, unspecified: Secondary | ICD-10-CM | POA: Diagnosis not present

## 2017-06-17 DIAGNOSIS — B351 Tinea unguium: Secondary | ICD-10-CM

## 2017-06-17 DIAGNOSIS — M79609 Pain in unspecified limb: Secondary | ICD-10-CM

## 2017-06-17 DIAGNOSIS — E119 Type 2 diabetes mellitus without complications: Secondary | ICD-10-CM | POA: Diagnosis not present

## 2017-06-17 NOTE — Progress Notes (Signed)
Subjective: Maria Sharp is a 52 y.o. female patient with history of diabetes who presents to office today complaining of long,mildly painful nails  while ambulating in shoes; unable to trim. Patient states that the glucose reading this morning was not recorded but yesterday was 126. Patient denies any new changes in medication or new problems. Patient denies any new cramping, numbness, burning or tingling in the legs. Admits to history of varicose veins and will get injections by vascular in April.   A1c unkown   Patient Active Problem List   Diagnosis Date Noted  . Constipation   . Asthma, chronic 09/28/2014  . Gastroparesis   . Varicose veins 09/14/2013  . Neck pain on left side 06/14/2013  . Current non-adherence to medical treatment 06/14/2013  . Vitamin D deficiency 10/05/2012  . Leg cramps 10/05/2012  . Family history of diabetes mellitus 10/05/2012  . 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 ABDOMINAL PAIN, UNSPECIFIED SITE 05/18/2010  . CHEST PAIN, INTERMITTENT 05/12/2009  . Obesity 04/05/2009  . CARDIOMEGALY 04/05/2009  . PALPITATIONS 04/05/2009  . Type 2 diabetes mellitus with peripheral circulatory disorder ([place]) 03/15/2009  . BACK PAIN 03/09/2008  . Hyperlipidemia LDL goal <100 03/03/2008  . Essential hypertension 03/03/2008  . GERD 03/03/2008  . IRRITABLE BOWEL SYNDROME 03/03/2008  . RECTAL BLEEDING 03/03/2008   Current Outpatient Medications on File Prior to Visit  Medication Sig Dispense Refill  . blood glucose meter kit and supplies KIT Dispense based on patient and insurance preference. Use up to four times daily as directed. (FOR ICD-9 250.00, 250.01). 1 each 0  . EPINEPHrine (EPI-PEN) 0.3 mg/0.3 mL DEVI Inject 0.3 mg into the muscle once as needed. For severe allergic reaction    . metFORMIN (GLUCOPHAGE-XR) 500 MG 24 hr tablet Take 4 tablets (2,000 mg total) by mouth daily with breakfast.    . olmesartan (BENICAR) 20 MG tablet Take 1 tablet (20 mg total) by  mouth daily. 90 tablet 3  . omeprazole (PRILOSEC) 40 MG capsule take 1 capsule by mouth once daily 90 capsule 3  . Pitavastatin Calcium (LIVALO) 1 MG TABS Take 1 tablet (1 mg total) by mouth daily. 90 tablet 0  . triamterene-hydrochlorothiazide (MAXZIDE-25) 37.5-25 MG tablet Take 1 tablet by mouth daily. 90 tablet 3  . Vitamin D, Ergocalciferol, (DRISDOL) 50000 units CAPS capsule Take 50,000 Units by mouth every 7 (seven) days.    . Vitamin Mixture (VITAMIN E COMPLETE PO) Take by mouth.     No current facility-administered medications on file prior to visit.    Allergies  Allergen Reactions  . Ace Inhibitors Anaphylaxis  . Shellfish Allergy Anaphylaxis and Other (See Comments)    Only shrimp  . Januvia [Sitagliptin] Other (See Comments)    Recurrent abdominal pain with elevated pancreas enzymes  . Latex Itching  . Lipitor [Atorvastatin]     Recent Results (from the past 2160 hour(s))  HM MAMMOGRAPHY     Status: None   Collection Time: 05/13/17 12:00 AM  Result Value Ref Range   HM Mammogram 0-4 Bi-Rad 0-4 Bi-Rad, Self Reported Normal    Comment: Recommended screening in 1 year.    Objective: General: Patient is awake, alert, and oriented x 3 and in no acute distress.  Integument: Skin is warm, dry and supple bilateral. Nails are tender, long, thickened and  dystrophic with subungual debris, consistent with onychomycosis, 1-5 bilateral. No signs of infection. No open lesions or preulcerative lesions present bilateral. Remaining integument unremarkable.  Vasculature:  Dorsalis Pedis  pulse 2/4 bilateral. Posterior Tibial pulse faint 1/4 bilateral.  Capillary fill time <3 sec 1-5 bilateral. Positive hair growth to the level of the digits. Temperature gradient within normal limits. + varicosities present bilateral. Trace edema present bilateral.   Neurology: The patient has intact sensation measured with a 5.07/10g Semmes Weinstein Monofilament at all pedal sites bilateral. Vibratory  sensation slightly diminished bilateral with tuning fork. No Babinski sign present bilateral.   Musculoskeletal: No symptomatic pedal deformities noted bilateral. Muscular strength 5/5 in all lower extremity muscular groups bilateral without pain on range of motion . No tenderness with calf compression bilateral.  Assessment and Plan: Problem List Items Addressed This Visit    None    Visit Diagnoses    Diabetes mellitus without complication ([place])    -  Primary   Pain due to onychomycosis of nail       PVD (peripheral vascular disease) ([place])          -Examined patient. -Discussed and educated patient on diabetic foot care, especially with  regards to the vascular, neurological and musculoskeletal systems.  -Stressed the importance of good glycemic control and the detriment of not  controlling glucose levels in relation to the foot. -Mechanically debrided all nails 1-5 bilateral using sterile nail nipper and filed with dremel without incident  -Answered all patient questions -Patient to return  in 54 days for at risk foot care -Patient advised to call the office if any problems or questions arise in the meantime.  Landis Martins, DPM

## 2017-06-23 ENCOUNTER — Encounter: Payer: Self-pay | Admitting: Family Medicine

## 2017-06-23 ENCOUNTER — Ambulatory Visit: Payer: 59 | Admitting: Family Medicine

## 2017-06-23 ENCOUNTER — Other Ambulatory Visit: Payer: Self-pay

## 2017-06-23 VITALS — BP 128/88 | HR 91 | Temp 97.8°F | Resp 18 | Ht 70.0 in | Wt 279.4 lb

## 2017-06-23 DIAGNOSIS — E1151 Type 2 diabetes mellitus with diabetic peripheral angiopathy without gangrene: Secondary | ICD-10-CM | POA: Diagnosis not present

## 2017-06-23 DIAGNOSIS — E559 Vitamin D deficiency, unspecified: Secondary | ICD-10-CM

## 2017-06-23 DIAGNOSIS — Z5181 Encounter for therapeutic drug level monitoring: Secondary | ICD-10-CM | POA: Diagnosis not present

## 2017-06-23 DIAGNOSIS — E785 Hyperlipidemia, unspecified: Secondary | ICD-10-CM | POA: Diagnosis not present

## 2017-06-23 LAB — POCT GLYCOSYLATED HEMOGLOBIN (HGB A1C): Hemoglobin A1C: 7

## 2017-06-23 MED ORDER — DAPAGLIFLOZIN PROPANEDIOL 5 MG PO TABS: 5.0000 mg | ORAL_TABLET | Freq: Every day | ORAL | 5 refills | Status: DC

## 2017-06-23 MED ORDER — TRIAMTERENE-HCTZ 37.5-25 MG PO TABS: 1.0000 | ORAL_TABLET | Freq: Every day | ORAL | 3 refills | Status: DC

## 2017-06-23 MED ORDER — PITAVASTATIN CALCIUM 1 MG PO TABS: 1.0000 mg | ORAL_TABLET | Freq: Every day | ORAL | 1 refills | Status: DC

## 2017-06-23 MED ORDER — ASPIRIN EC 81 MG PO TBEC: 81.0000 mg | DELAYED_RELEASE_TABLET | Freq: Every day | ORAL | Status: DC

## 2017-06-23 MED ORDER — OLMESARTAN MEDOXOMIL 20 MG PO TABS: 20.0000 mg | ORAL_TABLET | Freq: Every day | ORAL | 3 refills | Status: DC

## 2017-06-23 NOTE — Patient Instructions (Addendum)
Start over-the-counter vitamin D ~2000-4000u/day or take 5000u 2-3 a week. Stop the metformin and start the farxiga instead.  Recheck in 3-4 months to see how you are doing on that a lone.  IF you received an x-ray today, you will receive an invoice from [facility]. Please contact [facility] at [phone] with questions or concerns regarding your invoice.   IF you received labwork today, you will receive an invoice from [place]. Please contact [facility] at [phone] with questions or concerns regarding your invoice.   Our billing staff will not be able to assist you with questions regarding bills from these companies.  You will be contacted with the lab results as soon as they are available. The fastest way to get your results is to activate your My Chart account. Instructions are located on the last page of this paperwork. If you have not heard from [place] regarding the results in 2 weeks, please contact this office.     Diabetes Mellitus and Standards of Medical Care Managing diabetes (diabetes mellitus) can be complicated. Your diabetes treatment may be managed by a team of health care providers, including:  A diet and nutrition specialist (registered dietitian).  A nurse.  A certified diabetes educator (CDE).  A diabetes specialist (endocrinologist).  An eye doctor.  A primary care provider.  A dentist.  Your health care providers follow a schedule in order to help you get the best quality of care. The following schedule is a general guideline for your diabetes management plan. Your health care providers may also give you more specific instructions. HbA1c ( hemoglobin A1c) test This test provides information about blood sugar (glucose) control over the previous 2-3 months. It is used to check whether your diabetes management plan needs to be adjusted.  If you are meeting your treatment goals, this test is done at least 2 times a year.  If you are not  meeting treatment goals or if your treatment goals have changed, this test is done 4 times a year.  Blood pressure test  This test is done at every routine medical visit. For most people, the goal is less than 130/80. Ask your health care provider what your goal blood pressure should be. Dental and eye exams  Visit your dentist two times a year.  If you have type 1 diabetes, get an eye exam 3-5 years after you are diagnosed, and then once a year after your first exam. ? If you were diagnosed with type 1 diabetes as a child, get an eye exam when you are age 56 or older and have had diabetes for 3-5 years. After the first exam, you should get an eye exam once a year.  If you have type 2 diabetes, have an eye exam as soon as you are diagnosed, and then once a year after your first exam. Foot care exam  Visual foot exams are done at every routine medical visit. The exams check for cuts, bruises, redness, blisters, sores, or other problems with the feet.  A complete foot exam is done by your health care provider once a year. This exam includes an inspection of the structure and skin of your feet, and a check of the pulses and sensation in your feet. ? Type 1 diabetes: Get your first exam 3-5 years after diagnosis. ? Type 2 diabetes: Get your first exam as soon as you are diagnosed.  Check your feet every day for cuts, bruises, redness, blisters, or sores. If you have any of  these or other problems that are not healing, contact your health care provider. Kidney function test ( urine microalbumin)  This test is done once a year. ? Type 1 diabetes: Get your first test 5 years after diagnosis. ? Type 2 diabetes: Get your first test as soon as you are diagnosed.  If you have chronic kidney disease (CKD), get a serum creatinine and estimated glomerular filtration rate (eGFR) test once a year. Lipid profile (cholesterol, HDL, LDL, triglycerides)  This test should be done when you are diagnosed  with diabetes, and every 5 years after the first test. If you are on medicines to lower your cholesterol, you may need to get this test done every year. ? The goal for LDL is less than 100 mg/dL (5.5 mmol/L). If you are at high risk, the goal is less than 70 mg/dL (3.9 mmol/L). ? The goal for HDL is 40 mg/dL (2.2 mmol/L) for men and 50 mg/dL(2.8 mmol/L) for women. An HDL cholesterol of 60 mg/dL (3.3 mmol/L) or higher gives some protection against heart disease. ? The goal for triglycerides is less than 150 mg/dL (8.3 mmol/L). Immunizations  The yearly flu (influenza) vaccine is recommended for everyone 6 months or older who has diabetes.  The pneumonia (pneumococcal) vaccine is recommended for everyone 2 years or older who has diabetes. If you are 42 or older, you may get the pneumonia vaccine as a series of two separate shots.  The hepatitis B vaccine is recommended for adults shortly after they have been diagnosed with diabetes.  The Tdap (tetanus, diphtheria, and pertussis) vaccine should be given: ? According to normal childhood vaccination schedules, for children. ? Every 10 years, for adults who have diabetes.  The shingles vaccine is recommended for people who have had chicken pox and are 50 years or older. Mental and emotional health  Screening for symptoms of eating disorders, anxiety, and depression is recommended at the time of diagnosis and afterward as needed. If your screening shows that you have symptoms (you have a positive screening result), you may need further evaluation and be referred to a mental health care provider. Diabetes self-management education  Education about how to manage your diabetes is recommended at diagnosis and ongoing as needed. Treatment plan  Your treatment plan will be reviewed at every medical visit. Summary  Managing diabetes (diabetes mellitus) can be complicated. Your diabetes treatment may be managed by a team of health care  providers.  Your health care providers follow a schedule in order to help you get the best quality of care.  Standards of care including having regular physical exams, blood tests, blood pressure monitoring, immunizations, screening tests, and education about how to manage your diabetes.  Your health care providers may also give you more specific instructions based on your individual health. This information is not intended to replace advice given to you by your health care provider. Make sure you discuss any questions you have with your health care provider. Document Released: 02/03/2009 Document Revised: 01/05/2016 Document Reviewed: 01/05/2016 Elsevier Interactive Patient Education  [employer].

## 2017-06-23 NOTE — Progress Notes (Signed)
Subjective:  By signing my name below, I, Maria Sharp, attest that this documentation has been prepared under the direction and in the presence of [name], MD. Electronically Signed: Moises Sharp, [place]. 06/23/2017 , 8:54 AM .  Patient was seen in Room 1 .   Patient ID: Maria Sharp, female    DOB: 02-16-1966, 52 y.o.   MRN: [id number] Chief Complaint  Patient presents with  . Diabetes  . Follow-up   HPI Maria Sharp is a 52 y.o. female who presents to Primary Care at [facility] for follow up of diabetes. Patient was seen 4 months prior for chronic medical conditions. She had been changed to xigduo XR 500-1029m as sugars uncontrolled on metformin XR 20087m and concerned that it was irritating her IBS. However, she thought xigduo was causing her headaches and constipation, and was difficult to obtain from the pharmacy. So had patient switch back to metformin to see if her constipation, headache and nausea would resolve. Patient was instructed to start on pitavastatin several weeks after using her diabetic medication and patient start on aspirin 8191mas well as increased Vitamin D from 5000 to 15000 units per week.   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 Patient was referred to neurology by her ENT doctor for further evaluation for dizziness, which could be due to migraines but wanted to rule out MS. Patient had cancelled her neurology appointment several times and has been rescheduled for April. She was started on allergy shots for chronic sinusitis symptoms.   Patient believes she had a headache while on xigduo XR due to root canal at that time, and other sinusitis issues. She mentions that she didn't start the pitavastatin. She reports she's been taking her metformin 2000m59m. However, when she has irritated bowels, she mentions seeing a tablet in her  commode. Per chart, Januvia is listed in her allergy list, causing nausea, vomiting and diarrhea.   Lab Results  Component Value Date   HGBA1C 7.0 06/23/2017   HGBA1C 7.1 02/17/2017   HGBA1C 7.7 11/16/2016   She also requests medication refill. She reports that she didn't start OTC Vitamin D after prescription Vitamin D finished. She also mentions that her ears are backed up by wax and requests cleaning; notes wearing ear plugs a lot. She mentions allergy shots are going well.   Past Medical History:  Diagnosis Date  . Allergy   . Asthma   . Diabetes mellitus   . Fatty liver   . Gastritis   . Gastroparesis    study 11 14  nl abd us  [place]GERD (gastroesophageal reflux disease)   . High cholesterol   . History of varicella   . Hypertension   . IBS (irritable bowel syndrome)    Past Surgical History:  Procedure Laterality Date  . ANAL RECTAL MANOMETRY N/A 04/03/2015   Procedure: ANO RECTAL MANOMETRY;  Surgeon: KaviMauri Pole;  Location: [facility];  Service: Endoscopy;  Laterality: N/A;  . CHOLECYSTECTOMY    . WISDOM TOOTH EXTRACTION     Prior to Admission medications   Medication Sig Start Date End Date Taking? Authorizing Provider  Sharp glucose meter kit and supplies KIT Dispense based on patient and insurance preference. Use up to four times daily as directed. (FOR ICD-9 250.00, 250.01). 09/26/16  Yes [name]  EPINEPHrine (EPI-PEN)  0.3 mg/0.3 mL DEVI Inject 0.3 mg into the muscle once as needed. For severe allergic reaction   Yes [provider]  metFORMIN (GLUCOPHAGE-XR) 500 MG 24 hr tablet Take 4 tablets (2,000 mg total) by mouth daily with breakfast. 02/17/17  Yes Shawnee Knapp, MD  olmesartan (BENICAR) 20 MG tablet Take 1 tablet (20 mg total) by mouth daily. 09/26/16  Yes Shawnee Knapp, MD  omeprazole (PRILOSEC) 40 MG capsule take 1 capsule by mouth once daily 05/29/17  Yes Shawnee Knapp, MD  triamterene-hydrochlorothiazide (MAXZIDE-25) 37.5-25 MG tablet Take 1  tablet by mouth daily. 09/26/16  Yes Shawnee Knapp, MD  Vitamin Mixture (VITAMIN E COMPLETE PO) Take by mouth.   Yes [provider]  Pitavastatin Calcium (LIVALO) 1 MG TABS Take 1 tablet (1 mg total) by mouth daily. Patient not taking: Reported on 06/23/2017 02/17/17   Shawnee Knapp, MD  Vitamin D, Ergocalciferol, (DRISDOL) 50000 units CAPS capsule Take 50,000 Units by mouth every 7 (seven) days.    [provider]   Allergies  Allergen Reactions  . Ace Inhibitors Anaphylaxis  . Shellfish Allergy Anaphylaxis and Other (See Comments)    Only shrimp  . Januvia [Sitagliptin] Other (See Comments)    Recurrent abdominal pain with elevated pancreas enzymes  . Latex Itching  . Lipitor [Atorvastatin]    Family History  Problem Relation Age of Onset  . Prostate cancer Father   . Diabetes Father   . Hypertension Father   . Diabetes Mother   . Heart disease Mother   . Arthritis Mother   . Hyperlipidemia Mother   . Hypertension Mother   . Parkinson's disease Mother   . Thyroid disease Mother   . Diabetes Paternal Grandmother   . Arthritis Paternal Grandmother   . Hyperlipidemia Paternal Grandmother   . Heart disease Paternal Grandmother   . Dementia Paternal Grandmother   . Hypertension Paternal Grandmother   . Hyperlipidemia Maternal Grandmother   . Hypertension Maternal Grandmother   . Hyperlipidemia Maternal Grandfather   . Hypertension Maternal Grandfather   . Hyperlipidemia Paternal Grandfather   . Hypertension Paternal Grandfather   . Diabetes Paternal Grandfather   . Cholelithiasis Paternal Grandfather   . Colon cancer Neg Hx   . Esophageal cancer Neg Hx   . Stomach cancer Neg Hx   . Rectal cancer Neg Hx    Social History   Socioeconomic History  . Marital status: Single    Spouse name: None  . Number of children: None  . Years of education: None  . Highest education level: None  Social Needs  . Financial resource strain: None  . Food insecurity - worry:  None  . Food insecurity - inability: None  . Transportation needs - medical: None  . Transportation needs - non-medical: None  Occupational History  . None  Tobacco Use  . Smoking status: Never Smoker  . Smokeless tobacco: Never Used  Substance and Sexual Activity  . Alcohol use: No  . Drug use: No  . Sexual activity: None  Other Topics Concern  . None  Social History Narrative   Usually # of hours of sleep per night: 6   3 of people living at your residence? 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   Irregular sleep 7:30 to 11 or 12 and then 4 to 6:30 before she  goes to work   Has attended college.   Originally from [place]   Abnormal pap remote last 2012    Depression screen [facility] 2/9 06/23/2017 02/17/2017 11/23/2016 11/16/2016 09/26/2016  Decreased Interest 0 0 0 0 0  Down, Depressed, Hopeless 0 0 0 0 0  PHQ - 2 Score 0 0 0 0 0    Review of Systems  Constitutional: Negative for chills, fatigue, fever and unexpected weight change.  Respiratory: Negative for cough.   Gastrointestinal: Negative for constipation, diarrhea, nausea and vomiting.  Skin: Negative for rash and wound.  Neurological: Negative for dizziness, weakness and headaches.       Objective:   Physical Exam  Constitutional: She is oriented to person, place, and time. She appears well-developed and well-nourished. No distress.  HENT:  Head: Normocephalic and atraumatic.  Eyes: EOM are normal. Pupils are equal, round, and reactive to light.  Neck: Neck supple.  Cardiovascular: Normal rate.  Pulmonary/Chest: Effort normal. No respiratory distress.  Musculoskeletal: Normal range of motion.  Neurological: She is alert and oriented to person, place, and time.  Skin: Skin is warm and dry.  Psychiatric: She has a normal mood and affect. Her behavior is normal.  Nursing note and vitals  reviewed.   BP 128/88 (BP Location: Left Arm, Patient Position: Sitting, Cuff Size: Large)   Pulse 91   Temp 97.8 F (36.6 C) (Oral)   Resp 18   Ht 5' 10"  (1.778 m)   Wt 279 lb 6.4 oz (126.7 kg)   LMP 06/16/2017   SpO2 98%   BMI 40.09 kg/m   Results for orders placed or performed in visit on 06/23/17  POCT glycosylated hemoglobin (Hb A1C)  Result Value Ref Range   Hemoglobin A1C 7.0     Assessment & Plan:   1. Type 2 diabetes mellitus with peripheral circulatory disorder (HCC) - stop metformin as exacerbating IBS-D. S/p extensive w/u with Dr. Johna Roles at [place] 5 yrs ago. Did well on Januvia but did have recurrent n/v/RUQ pain suspected to be due to gastritis/gastroparesis but then had lipase increase to 60 (nml 11-59) so taken off [place] and put on allergy list - looks like GI sxs might have gotten a little better but not much though lipase did always remain in 20s after. VERY opposed to injection - even once weekly - showed bydureon demo and her fiance is doing very well on trulicity but wants to retry farxiga as suspects the needed root canal and recurrent sinus sxs might have been causing the HA she had during the farxiga trial (as well as c/o nausea and constipation with it but wants to retry.) d/c metformin and start farxiga 5. Recheck in 3-4 mos and can consider increasing farxiga dose vs adding in low dose of a GLP-1 at that time.   2. Medication monitoring encounter   3. Hyperlipidemia LDL goal <100 - pt has not yet started livalo but agrees to try sev wks after DM med  change  4. Vitamin D deficiency - not taking any after high dose course was completed - restart otc supp    Orders Placed This Encounter  Procedures  . Comprehensive metabolic panel    Order Specific Question:   Has the patient fasted?    Answer:   Yes  . Lipid panel    Order Specific Question:   Has the patient fasted?    Answer:   Yes  . VITAMIN D 25 Hydroxy (  Vit-D Deficiency, Fractures)  . Vitamin  B12  . Ambulatory referral to Ophthalmology    Referral Priority:   Routine    Referral Type:   Consultation    Referral Reason:   Specialty Services Required    Requested Specialty:   Ophthalmology    Number of Visits Requested:   1  . POCT glycosylated hemoglobin (Hb A1C)    Meds ordered this encounter  Medications  . dapagliflozin propanediol (FARXIGA) 5 MG TABS tablet    Sig: Take 5 mg by mouth daily.    Dispense:  30 tablet    Refill:  5  . Pitavastatin Calcium (LIVALO) 1 MG TABS    Sig: Take 1 tablet (1 mg total) by mouth daily.    Dispense:  90 tablet    Refill:  1  . olmesartan (BENICAR) 20 MG tablet    Sig: Take 1 tablet (20 mg total) by mouth daily.    Dispense:  90 tablet    Refill:  3  . triamterene-hydrochlorothiazide (MAXZIDE-25) 37.5-25 MG tablet    Sig: Take 1 tablet by mouth daily.    Dispense:  90 tablet    Refill:  3    I personally performed the services described in this documentation, which was scribed in my presence. The recorded information has been reviewed and considered, and addended by me as needed.   [name], M.D.  Primary Care at [facility] [address] [place], [place] [address] [phone] phone [phone] fax  06/23/17 9:13 AM

## 2017-06-24 LAB — VITAMIN B12: Vitamin B-12: 311 pg/mL (ref 232–1245)

## 2017-06-24 LAB — LIPID PANEL
Chol/HDL Ratio: 3.6 ratio (ref 0.0–4.4)
Cholesterol, Total: 196 mg/dL (ref 100–199)
HDL: 54 mg/dL (ref >39)
LDL Calculated: 114 mg/dL — ABNORMAL HIGH (ref 0–99)
Triglycerides: 138 mg/dL (ref 0–149)
VLDL Cholesterol Cal: 28 mg/dL (ref 5–40)

## 2017-06-24 LAB — COMPREHENSIVE METABOLIC PANEL
ALT: 10 IU/L (ref 0–32)
AST: 10 IU/L (ref 0–40)
Albumin/Globulin Ratio: 1.4 (ref 1.2–2.2)
Albumin: 4.3 g/dL (ref 3.5–5.5)
Alkaline Phosphatase: 60 IU/L (ref 39–117)
BUN/Creatinine Ratio: 18 (ref 9–23)
BUN: 12 mg/dL (ref 6–24)
Bilirubin Total: 0.3 mg/dL (ref 0.0–1.2)
CO2: 26 mmol/L (ref 20–29)
Calcium: 9.7 mg/dL (ref 8.7–10.2)
Chloride: 96 mmol/L (ref 96–106)
Creatinine, Ser: 0.67 mg/dL (ref 0.57–1.00)
GFR calc Af Amer: 118 mL/min/{1.73_m2} (ref >59)
GFR calc non Af Amer: 102 mL/min/{1.73_m2} (ref >59)
Globulin, Total: 3.1 g/dL (ref 1.5–4.5)
Glucose: 106 mg/dL — ABNORMAL HIGH (ref 65–99)
Potassium: 3.9 mmol/L (ref 3.5–5.2)
Sodium: 138 mmol/L (ref 134–144)
Total Protein: 7.4 g/dL (ref 6.0–8.5)

## 2017-06-24 LAB — VITAMIN D 25 HYDROXY (VIT D DEFICIENCY, FRACTURES): Vit D, 25-Hydroxy: 26 ng/mL — ABNORMAL LOW (ref 30.0–100.0)

## 2017-06-25 ENCOUNTER — Encounter: Payer: Self-pay | Admitting: Radiology

## 2017-07-23 ENCOUNTER — Ambulatory Visit: Payer: 59 | Admitting: Neurology

## 2017-07-31 ENCOUNTER — Other Ambulatory Visit: Payer: Self-pay

## 2017-07-31 ENCOUNTER — Ambulatory Visit: Payer: 59 | Admitting: Family Medicine

## 2017-07-31 ENCOUNTER — Encounter: Payer: Self-pay | Admitting: Family Medicine

## 2017-07-31 VITALS — BP 112/80 | HR 88 | Temp 98.4°F | Resp 16 | Ht 70.0 in | Wt 274.8 lb

## 2017-07-31 DIAGNOSIS — J4521 Mild intermittent asthma with (acute) exacerbation: Secondary | ICD-10-CM

## 2017-07-31 DIAGNOSIS — J328 Other chronic sinusitis: Secondary | ICD-10-CM

## 2017-07-31 DIAGNOSIS — E1151 Type 2 diabetes mellitus with diabetic peripheral angiopathy without gangrene: Secondary | ICD-10-CM

## 2017-07-31 DIAGNOSIS — J0100 Acute maxillary sinusitis, unspecified: Secondary | ICD-10-CM

## 2017-07-31 MED ORDER — GUAIFENESIN-CODEINE 100-10 MG/5ML PO SOLN: 5.0000 mL | ORAL | 0 refills | Status: DC | PRN

## 2017-07-31 MED ORDER — TRIAMCINOLONE ACETONIDE 55 MCG/ACT NA AERO: 2.0000 | INHALATION_SPRAY | Freq: Every day | NASAL | 12 refills | Status: DC

## 2017-07-31 MED ORDER — METHYLPREDNISOLONE ACETATE 80 MG/ML IJ SUSP
120.0000 mg | Freq: Once | INTRAMUSCULAR | Status: AC
  Administered 2017-07-31: 120 mg via INTRAMUSCULAR

## 2017-07-31 MED ORDER — AZITHROMYCIN 250 MG PO TABS: ORAL_TABLET | ORAL | 0 refills | Status: DC

## 2017-07-31 MED ORDER — BENZONATATE 200 MG PO CAPS: 200.0000 mg | ORAL_CAPSULE | Freq: Three times a day (TID) | ORAL | 0 refills | Status: DC | PRN

## 2017-07-31 NOTE — Progress Notes (Signed)
Subjective:  By signing my name below, I, Maria Sharp, attest that this documentation has been prepared under the direction and in the presence of [name], MD Electronically Signed: Ladene Artist, ED Scribe 07/31/2017 at 10:22 AM.   Patient ID: Maria Sharp, female    DOB: [birth year], 52 y.o.   MRN: [id number]  Chief Complaint  Patient presents with  . URI    nasal/chest congestion yellow mucus, clear phlegm x 4 days. ears hurt, cough , sneezing    HPI Maria Sharp is a 52 y.o. female who presents to Primary Care at [facility] complaining of dry cough onset 4 days ago. Pt reports associated symptoms of clear discolored nasal discharge that turned yellow last night, ear pain, sneezing, bilateral side pain from coughing, nasal congestion, postnasal drip, hoarseness and wheezing onset yesterday. Picked up albuterol inhaler last night but has not used it yet. She has tried Aleve, Robitissunion, Mucinex.  DM Pt stopped Wilder Glade due to side-effects of elevated blood sugar in the 150s, feeling jittery and fatigue so she restarted Metformin. Has noticed recent blood glucose in the 120s. She did not notice a change in GI symptoms even when she switched from Metformin.  Past Medical History:  Diagnosis Date  . Allergy   . Asthma   . Diabetes mellitus   . Fatty liver   . Gastritis   . Gastroparesis    study 11 14  nl abd [place]  . GERD (gastroesophageal reflux disease)   . High cholesterol   . History of varicella   . Hypertension   . IBS (irritable bowel syndrome)    Current Outpatient Medications on File Prior to Visit  Medication Sig Dispense Refill  . aspirin EC 81 MG tablet Take 1 tablet (81 mg total) by mouth daily.    . blood glucose meter kit and supplies KIT Dispense based on patient and insurance preference. Use up to four times daily as directed. (FOR ICD-9 250.00, 250.01). 1 each 0  . Cholecalciferol (VITAMIN D3) 50000 units CAPS Take by mouth.    . EPINEPHrine (EPI-PEN)  0.3 mg/0.3 mL DEVI Inject 0.3 mg into the muscle once as needed. For severe allergic reaction    . olmesartan (BENICAR) 20 MG tablet Take 1 tablet (20 mg total) by mouth daily. 90 tablet 3  . omeprazole (PRILOSEC) 40 MG capsule take 1 capsule by mouth once daily 90 capsule 3  . Pitavastatin Calcium (LIVALO) 1 MG TABS Take 1 tablet (1 mg total) by mouth daily. 90 tablet 1  . triamterene-hydrochlorothiazide (MAXZIDE-25) 37.5-25 MG tablet Take 1 tablet by mouth daily. 90 tablet 3  . Vitamin Mixture (VITAMIN E COMPLETE PO) Take by mouth.    Marland Kitchen b complex vitamins tablet Take by mouth.    . dapagliflozin propanediol (FARXIGA) 5 MG TABS tablet Take 5 mg by mouth daily. (Patient not taking: Reported on 07/31/2017) 30 tablet 5  . metFORMIN (GLUCOPHAGE) 500 MG tablet TK 4 TS PO ONCE D WF  0  . neomycin-polymyxin b-dexamethasone (MAXITROL) 3.5-10000-0.1 OINT APPLY 1 FILM BY OPTHALMIC ROUTE TWICE DAILY INTO AFFECTED EYES  0   No current facility-administered medications on file prior to visit.    Allergies  Allergen Reactions  . Ace Inhibitors Anaphylaxis  . Shellfish Allergy Anaphylaxis and Other (See Comments)    Only shrimp  . Januvia [Sitagliptin] Other (See Comments)    Recurrent abdominal pain with elevated pancreas enzymes  . Latex Itching  . Lipitor [Atorvastatin]  Review of Systems  HENT: Positive for congestion, ear pain, rhinorrhea, sneezing and voice change.   Respiratory: Positive for cough and wheezing.       Objective:   Physical Exam  Constitutional: She is oriented to person, place, and time. She appears well-developed and well-nourished. No distress.  HENT:  Head: Normocephalic and atraumatic.  Right Ear: Tympanic membrane and ear canal normal.  Left Ear: Tympanic membrane and ear canal normal.  Nose: Rhinorrhea present.  Nose: boggy pale mucous  Mouth/Throat: Copious clear postnasal drip  Eyes: Conjunctivae and EOM are normal.  Neck: Neck supple. No tracheal deviation  present.  Cardiovascular: Regular rhythm. Tachycardia present.  Murmur heard.  Systolic (R upper sternal border) murmur is present with a grade of 2/6. Pulmonary/Chest: Effort normal. No respiratory distress.  Lungs clear but decreased air movement  Musculoskeletal: Normal range of motion.  Lymphadenopathy:    She has cervical adenopathy (anterior).       Right: No supraclavicular adenopathy present.       Left: No supraclavicular adenopathy present.  L upper anterior cervical adenopathy. No posterior cervical adenopathy.  Neurological: She is alert and oriented to person, place, and time.  Skin: Skin is warm and dry.  L foot: No rashes. Normal nails. 2+ pulses.  Psychiatric: She has a normal mood and affect. Her behavior is normal.  Nursing note and vitals reviewed.  BP 112/80   Pulse 88   Temp 98.4 F (36.9 C)   Resp 16   Ht _0  (1.778 m)   Wt 274 lb 12.8 oz (124.6 kg)   SpO2 97%   BMI 39.43 kg/m     Assessment & Plan:   1. Acute non-recurrent maxillary sinusitis   2. CHRONIC RHINOSINUSITIS   3. Mild intermittent chronic asthma with acute exacerbation   4. Type 2 diabetes mellitus with peripheral circulatory disorder (HCC)     Meds ordered this encounter  Medications  . methylPREDNISolone acetate (DEPO-MEDROL) injection 120 mg  . azithromycin (ZITHROMAX) 250 MG tablet    Sig: Take 2 tabs PO x 1 dose, then 1 tab PO QD x 4 days    Dispense:  6 tablet    Refill:  0  . triamcinolone (NASACORT) 55 MCG/ACT AERO nasal inhaler    Sig: Place 2 sprays into the nose daily.    Dispense:  1 Inhaler    Refill:  12  . benzonatate (TESSALON) 200 MG capsule    Sig: Take 1 capsule (200 mg total) by mouth 3 (three) times daily as needed for cough.    Dispense:  60 capsule    Refill:  0  . guaiFENesin-codeine 100-10 MG/5ML syrup    Sig: Take 5-10 mLs by mouth every 4 (four) hours as needed for cough. Sedation, and sinus pain in evening    Dispense:  120 mL    Refill:  0     Please hold on file until pt requests - she will try the benzonatate first to see if it is effective    I personally performed the services described in this documentation, which was scribed in my presence. The recorded information has been reviewed and considered, and addended by me as needed.   [name], M.D.  Primary Care at [facility] [address] [place], [place] [address] [phone] phone [phone] fax  11/13/17 10:36 AM

## 2017-07-31 NOTE — Patient Instructions (Addendum)
Use the nasacort x 2 weeks minimum. The pharmacist will keep the codeine cough syrup on file for you which you can request to have filled if the benzonatate is not effective at keeping your symptoms to bay at night. Continue on the mucinex.    IF you received an x-ray today, you will receive an invoice from [facility]. Please contact [facility] at [phone] with questions or concerns regarding your invoice.   IF you received labwork today, you will receive an invoice from [facility]. Please contact [facility] at [phone] with questions or concerns regarding your invoice.   Our billing staff will not be able to assist you with questions regarding bills from these companies.  You will be contacted with the lab results as soon as they are available. The fastest way to get your results is to activate your My Chart account. Instructions are located on the last page of this paperwork. If you have not heard from [place] regarding the results in 2 weeks, please contact this office.    Allergic Rhinitis, Adult Allergic rhinitis is an allergic reaction that affects the mucous membrane inside the nose. It causes sneezing, a runny or stuffy nose, and the feeling of mucus going down the back of the throat (postnasal drip). Allergic rhinitis can be mild to severe. There are two types of allergic rhinitis:  Seasonal. This type is also called hay fever. It happens only during certain seasons.  Perennial. This type can happen at any time of the year.  What are the causes? This condition happens when the body's defense system (immune system) responds to certain harmless substances called allergens as though they were germs.  Seasonal allergic rhinitis is triggered by pollen, which can come from grasses, trees, and weeds. Perennial allergic rhinitis may be caused by:  House dust mites.  Pet dander.  Mold spores.  What are the signs or symptoms? Symptoms of this condition  include:  Sneezing.  Runny or stuffy nose (nasal congestion).  Postnasal drip.  Itchy nose.  Tearing of the eyes.  Trouble sleeping.  Daytime sleepiness.  How is this diagnosed? This condition may be diagnosed based on:  Your medical history.  A physical exam.  Tests to check for related conditions, such as: ? Asthma. ? Pink eye. ? Ear infection. ? Upper respiratory infection.  Tests to find out which allergens trigger your symptoms. These may include skin or blood tests.  How is this treated? There is no cure for this condition, but treatment can help control symptoms. Treatment may include:  Taking medicines that block allergy symptoms, such as antihistamines. Medicine may be given as a shot, nasal spray, or pill.  Avoiding the allergen.  Desensitization. This treatment involves getting ongoing shots until your body becomes less sensitive to the allergen. This treatment may be done if other treatments do not help.  If taking medicine and avoiding the allergen does not work, new, stronger medicines may be prescribed.  Follow these instructions at home:  Find out what you are allergic to. Common allergens include smoke, dust, and pollen.  Avoid the things you are allergic to. These are some things you can do to help avoid allergens: ? Replace carpet with wood, tile, or vinyl flooring. Carpet can trap dander and dust. ? Do not smoke. Do not allow smoking in your home. ? Change your heating and air conditioning filter at least once a month. ? During allergy season:  Keep windows closed as much as possible.  Plan outdoor  activities when pollen counts are lowest. This is usually during the evening hours.  When coming indoors, change clothing and shower before sitting on furniture or bedding.  Take over-the-counter and prescription medicines only as told by your health care provider.  Keep all follow-up visits as told by your health care provider. This is  important. Contact a health care provider if:  You have a fever.  You develop a persistent cough.  You make whistling sounds when you breathe (you wheeze).  Your symptoms interfere with your normal daily activities. Get help right away if:  You have shortness of breath. Summary  This condition can be managed by taking medicines as directed and avoiding allergens.  Contact your health care provider if you develop a persistent cough or fever.  During allergy season, keep windows closed as much as possible. This information is not intended to replace advice given to you by your health care provider. Make sure you discuss any questions you have with your health care provider. Document Released: 01/01/2001 Document Revised: 05/16/2016 Document Reviewed: 05/16/2016 Elsevier Interactive Patient Education  2018 [employer].   Bronchospasm, Adult Bronchospasm is a tightening of the airways going into the lungs. During an episode, it may be harder to breathe. You may cough, and you may make a whistling sound when you breathe (wheeze). This condition often affects people with asthma. What are the causes? This condition is caused by swelling and irritation in the airways. It can be triggered by:  An infection (common).  Seasonal allergies.  An allergic reaction.  Exercise.  Irritants. These include pollution, cigarette smoke, strong odors, aerosol sprays, and paint fumes.  Weather changes. Winds increase molds and pollens in the air. Cold air may cause swelling.  Stress and emotional upset.  What are the signs or symptoms? Symptoms of this condition include:  Wheezing. If the episode was triggered by an allergy, wheezing may start right away or hours later.  Nighttime coughing.  Frequent or severe coughing with a simple cold.  Chest tightness.  Shortness of breath.  Decreased ability to exercise.  How is this diagnosed? This condition is usually diagnosed with a  review of your medical history and a physical exam. Tests, such as lung function tests, are sometimes done to look for other conditions. The need for a chest X-ray depends on where the wheezing occurs and whether it is the first time you have wheezed. How is this treated? This condition may be treated with:  Inhaled medicines. These open up the airways and help you breathe. They can be taken with an inhaler or a nebulizer device.  Corticosteroid medicines. These may be given for severe bronchospasm, usually when it is associated with asthma.  Avoiding triggers, such as irritants, infection, or allergies.  Follow these instructions at home: Medicines  Take over-the-counter and prescription medicines only as told by your health care provider.  If you need to use an inhaler or nebulizer to take your medicine, ask your health care provider to explain how to use it correctly. If you were given a spacer, always use it with your inhaler. Lifestyle  Reduce the number of triggers in your home. To do this: ? Change your heating and air conditioning filter at least once a month. ? Limit your use of fireplaces and wood stoves. ? Do not smoke. Do not allow smoking in your home. ? Avoid using perfumes and fragrances. ? Get rid of pests, such as roaches and mice, and their droppings. ? Remove  any mold from your home. ? Keep your house clean and dust free. Use unscented cleaning products. ? Replace carpet with wood, tile, or vinyl flooring. Carpet can trap dander and dust. ? Use allergy-proof pillows, mattress covers, and box spring covers. ? Wash bed sheets and blankets every week in hot water. Dry them in a dryer. ? Use blankets that are made of polyester or cotton. ? Wash your hands often. ? Do not allow pets in your bedroom.  Avoid breathing in cold air when you exercise. General instructions  Have a plan for seeking medical care. Know when to call your health care provider and local  emergency services, and where to get emergency care.  Stay up to date on your immunizations.  When you have an episode of bronchospasm, stay calm. Try to relax and breathe more slowly.  If you have asthma, make sure you have an asthma action plan.  Keep all follow-up visits as told by your health care provider. This is important. Contact a health care provider if:  You have muscle aches.  You have chest pain.  The mucus that you cough up (sputum) changes from clear or white to yellow, green, gray, or bloody.  You have a fever.  Your sputum gets thicker. Get help right away if:  Your wheezing and coughing get worse, even after you take your prescribed medicines.  It gets even harder to breathe.  You develop severe chest pain. Summary  Bronchospasm is a tightening of the airways going into the lungs.  During an episode of bronchospasm, you may have a harder time breathing. You may cough and make a whistling sound when you breathe (wheeze).  Avoid exposure to triggers such as smoke, dust, mold, animal dander, and fragrances.  When you have an episode of bronchospasm, stay calm. Try to relax and breathe more slowly. This information is not intended to replace advice given to you by your health care provider. Make sure you discuss any questions you have with your health care provider. Document Released: 04/11/2003 Document Revised: 04/04/2016 Document Reviewed: 04/04/2016 Elsevier Interactive Patient Education  2017 [employer].

## 2017-08-19 ENCOUNTER — Ambulatory Visit: Payer: 59 | Admitting: Sports Medicine

## 2017-08-19 ENCOUNTER — Ambulatory Visit: Payer: 59 | Admitting: Vascular Surgery

## 2017-09-01 ENCOUNTER — Ambulatory Visit: Payer: 59 | Admitting: Physician Assistant

## 2017-09-02 ENCOUNTER — Ambulatory Visit: Payer: 59 | Admitting: Sports Medicine

## 2017-09-02 ENCOUNTER — Other Ambulatory Visit: Payer: Self-pay

## 2017-09-02 ENCOUNTER — Encounter: Payer: Self-pay | Admitting: Sports Medicine

## 2017-09-02 ENCOUNTER — Encounter

## 2017-09-02 DIAGNOSIS — M79676 Pain in unspecified toe(s): Secondary | ICD-10-CM | POA: Diagnosis not present

## 2017-09-02 DIAGNOSIS — M79609 Pain in unspecified limb: Principal | ICD-10-CM

## 2017-09-02 DIAGNOSIS — B351 Tinea unguium: Secondary | ICD-10-CM | POA: Diagnosis not present

## 2017-09-02 DIAGNOSIS — M1990 Unspecified osteoarthritis, unspecified site: Secondary | ICD-10-CM | POA: Insufficient documentation

## 2017-09-02 DIAGNOSIS — I739 Peripheral vascular disease, unspecified: Secondary | ICD-10-CM | POA: Diagnosis not present

## 2017-09-02 DIAGNOSIS — G43909 Migraine, unspecified, not intractable, without status migrainosus: Secondary | ICD-10-CM | POA: Insufficient documentation

## 2017-09-02 DIAGNOSIS — E119 Type 2 diabetes mellitus without complications: Secondary | ICD-10-CM

## 2017-09-02 NOTE — Progress Notes (Signed)
Subjective: Maria Sharp is a 52 y.o. female patient with history of diabetes who presents to office today complaining of long,mildly painful nails  while ambulating in shoes; unable to trim. Patient states that the glucose reading this morning was not recorded. Patient denies any new changes in medication or new problems.  A1c unkown   Patient Active Problem List   Diagnosis Date Noted  . Migraines 09/02/2017  . DJD (degenerative joint disease) 09/02/2017  . Vertigo 12/05/2016  . Bilateral temporomandibular joint pain 12/05/2016  . Constipation   . Asthma, chronic 09/28/2014  . Gastroparesis   . Varicose veins 09/14/2013  . Neck pain on left side 06/14/2013  . Current non-adherence to medical treatment 06/14/2013  . Vitamin D deficiency 10/05/2012  . Leg cramps 10/05/2012  . Family history of diabetes mellitus 10/05/2012  . Type 2 diabetes mellitus, uncontrolled ([place]) 05/28/2012  . 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 ABDOMINAL PAIN, UNSPECIFIED SITE 05/18/2010  . CHEST PAIN, INTERMITTENT 05/12/2009  . Obesity 04/05/2009  . CARDIOMEGALY 04/05/2009  . PALPITATIONS 04/05/2009  . Type 2 diabetes mellitus with peripheral circulatory disorder ([place]) 03/15/2009  . BACK PAIN 03/09/2008  . Hyperlipidemia LDL goal <100 03/03/2008  . Essential hypertension 03/03/2008  . GERD 03/03/2008  . IRRITABLE BOWEL SYNDROME 03/03/2008  . RECTAL BLEEDING 03/03/2008   Current Outpatient Medications on File Prior to Visit  Medication Sig Dispense Refill  . aspirin EC 81 MG tablet Take 1 tablet (81 mg total) by mouth daily.    Marland Kitchen azithromycin (ZITHROMAX) 250 MG tablet Take 2 tabs PO x 1 dose, then 1 tab PO QD x 4 days 6 tablet 0  . b complex vitamins tablet Take by mouth.    . benzonatate (TESSALON) 200 MG capsule Take 1 capsule (200 mg total) by mouth 3 (three) times daily as needed for cough. 60 capsule 0  . blood glucose meter kit and supplies KIT Dispense based on patient and insurance  preference. Use up to four times daily as directed. (FOR ICD-9 250.00, 250.01). 1 each 0  . Cholecalciferol (VITAMIN D3) 50000 units CAPS Take by mouth.    . dapagliflozin propanediol (FARXIGA) 5 MG TABS tablet Take 5 mg by mouth daily. (Patient not taking: Reported on 07/31/2017) 30 tablet 5  . EPINEPHrine (EPI-PEN) 0.3 mg/0.3 mL DEVI Inject 0.3 mg into the muscle once as needed. For severe allergic reaction    . guaiFENesin-codeine 100-10 MG/5ML syrup Take 5-10 mLs by mouth every 4 (four) hours as needed for cough. Sedation, and sinus pain in evening 120 mL 0  . metFORMIN (GLUCOPHAGE) 500 MG tablet TK 4 TS PO ONCE D WF  0  . neomycin-polymyxin b-dexamethasone (MAXITROL) 3.5-10000-0.1 OINT APPLY 1 FILM BY OPTHALMIC ROUTE TWICE DAILY INTO AFFECTED EYES  0  . olmesartan (BENICAR) 20 MG tablet Take 1 tablet (20 mg total) by mouth daily. 90 tablet 3  . omeprazole (PRILOSEC) 40 MG capsule take 1 capsule by mouth once daily 90 capsule 3  . Pitavastatin Calcium (LIVALO) 1 MG TABS Take 1 tablet (1 mg total) by mouth daily. 90 tablet 1  . triamcinolone (NASACORT) 55 MCG/ACT AERO nasal inhaler Place 2 sprays into the nose daily. 1 Inhaler 12  . triamterene-hydrochlorothiazide (MAXZIDE-25) 37.5-25 MG tablet Take 1 tablet by mouth daily. 90 tablet 3  . Vitamin Mixture (VITAMIN E COMPLETE PO) Take by mouth.     No current facility-administered medications on file prior to visit.    Allergies  Allergen Reactions  .  Ace Inhibitors Anaphylaxis  . Shellfish Allergy Anaphylaxis and Other (See Comments)    Only shrimp  . Januvia [Sitagliptin] Other (See Comments)    Recurrent abdominal pain with elevated pancreas enzymes  . Latex Itching  . Lipitor [Atorvastatin]     Recent Results (from the past 2160 hour(s))  POCT glycosylated hemoglobin (Hb A1C)     Status: None   Collection Time: 06/23/17  8:31 AM  Result Value Ref Range   Hemoglobin A1C 7.0   Comprehensive metabolic panel     Status: Abnormal    Collection Time: 06/23/17 10:15 AM  Result Value Ref Range   Glucose 106 (H) 65 - 99 mg/dL   BUN 12 6 - 24 mg/dL   Creatinine, Ser 0.67 0.57 - 1.00 mg/dL   GFR calc non Af Amer 102 >59 mL/min/1.73   GFR calc Af Amer 118 >59 mL/min/1.73   BUN/Creatinine Ratio 18 9 - 23   Sodium 138 134 - 144 mmol/L   Potassium 3.9 3.5 - 5.2 mmol/L   Chloride 96 96 - 106 mmol/L   CO2 26 20 - 29 mmol/L   Calcium 9.7 8.7 - 10.2 mg/dL   Total Protein 7.4 6.0 - 8.5 g/dL   Albumin 4.3 3.5 - 5.5 g/dL   Globulin, Total 3.1 1.5 - 4.5 g/dL   Albumin/Globulin Ratio 1.4 1.2 - 2.2   Bilirubin Total 0.3 0.0 - 1.2 mg/dL   Alkaline Phosphatase 60 39 - 117 IU/L   AST 10 0 - 40 IU/L   ALT 10 0 - 32 IU/L  Lipid panel     Status: Abnormal   Collection Time: 06/23/17 10:15 AM  Result Value Ref Range   Cholesterol, Total 196 100 - 199 mg/dL   Triglycerides 138 0 - 149 mg/dL   HDL 54 >39 mg/dL   VLDL Cholesterol Cal 28 5 - 40 mg/dL   LDL Calculated 114 (H) 0 - 99 mg/dL   Chol/HDL Ratio 3.6 0.0 - 4.4 ratio    Comment:                                   T. Chol/HDL Ratio                                             Men  Women                               1/2 Avg.Risk  3.4    3.3                                   Avg.Risk  5.0    4.4                                2X Avg.Risk  9.6    7.1                                3X Avg.Risk 23.4   11.0   VITAMIN D 25 Hydroxy (Vit-D Deficiency, Fractures)     Status: Abnormal  Collection Time: 06/23/17 10:15 AM  Result Value Ref Range   Vit D, 25-Hydroxy 26.0 (L) 30.0 - 100.0 ng/mL    Comment: Vitamin D deficiency has been defined by the [place] practice guideline as a level of serum 25-OH vitamin D less than 20 ng/mL (1,2). The Endocrine Society went on to further define vitamin D insufficiency as a level between 21 and 29 ng/mL (2). 1. IOM (Institute of Medicine). 2010. Dietary reference    intakes for calcium and D. [place]:  The    [employer]. 2. Holick MF, Binkley [place], Bischoff-Ferrari HA, et al.    Evaluation, treatment, and prevention of vitamin D    deficiency: an Endocrine Society clinical practice    guideline. JCEM. 2011 Jul; 96(7):1911-30.   Vitamin B12     Status: None   Collection Time: 06/23/17 10:15 AM  Result Value Ref Range   Vitamin B-12 311 232 - 1,245 pg/mL    Objective: General: Patient is awake, alert, and oriented x 3 and in no acute distress.  Integument: Skin is warm, dry and supple bilateral. Nails are tender, long, thickened and  dystrophic with subungual debris, consistent with onychomycosis, 1-5 bilateral. No signs of infection. No open lesions or preulcerative lesions present bilateral. Remaining integument unremarkable.  Vasculature:  Dorsalis Pedis pulse 2/4 bilateral. Posterior Tibial pulse faint 1/4 bilateral.  Capillary fill time <3 sec 1-5 bilateral. Positive hair growth to the level of the digits. Temperature gradient within normal limits. + varicosities present bilateral. Trace edema present bilateral.   Neurology: The patient has intact sensation measured with a 5.07/10g Semmes Weinstein Monofilament at all pedal sites bilateral. Vibratory sensation slightly diminished bilateral with tuning fork. No Babinski sign present bilateral.   Musculoskeletal: No symptomatic pedal deformities noted bilateral. Muscular strength 5/5 in all lower extremity muscular groups bilateral without pain on range of motion . No tenderness with calf compression bilateral.  Assessment and Plan: Problem List Items Addressed This Visit    None    Visit Diagnoses    Pain due to onychomycosis of nail    -  Primary   Diabetes mellitus without complication (HCC)       PVD (peripheral vascular disease) ([place])         -Examined patient. -Discussed and educated patient on diabetic foot care, especially with  regards to the vascular, neurological and musculoskeletal systems.   -Stressed the importance of good glycemic control and the detriment of not  controlling glucose levels in relation to the foot. -Mechanically debrided all nails 1-5 bilateral using sterile nail nipper and filed with dremel without incident  -Answered all patient questions -Patient to return  in 67 days for at risk foot care -Patient advised to call the office if any problems or questions arise in the meantime.  Landis Martins, DPM

## 2017-09-09 ENCOUNTER — Ambulatory Visit: Payer: 59 | Admitting: Vascular Surgery

## 2017-09-09 ENCOUNTER — Encounter: Payer: Self-pay | Admitting: Vascular Surgery

## 2017-09-09 VITALS — BP 150/90 | HR 73 | Temp 97.5°F | Resp 16 | Ht 70.0 in | Wt 275.0 lb

## 2017-09-09 DIAGNOSIS — I83893 Varicose veins of bilateral lower extremities with other complications: Secondary | ICD-10-CM

## 2017-09-09 NOTE — Progress Notes (Signed)
Vascular and [facility]in Specialist of [facility]  Patient name: Maria Sharp MRN: [id number] DOB: [birth year] Sex: female  REASON FOR VISIT: Continued follow-up of leg varicose veins.  HPI: Maria Sharp is a 52 y.o. female here today for continued follow-up.  She has been compliant with her compression and elevation.  She reports continued discomfort over her varicosities despite this.  She does have venous reflux in her great saphenous vein with a large plexus of varicosities in her medial calf.  Also multiple large raised telangiectasia over her distal calf and ankle.  Past Medical History:  Diagnosis Date  . Allergy   . Asthma   . Diabetes mellitus   . Fatty liver   . Gastritis   . Gastroparesis    study 11 14  nl abd [place]  . GERD (gastroesophageal reflux disease)   . High cholesterol   . History of varicella   . Hypertension   . IBS (irritable bowel syndrome)     Family History  Problem Relation Age of Onset  . Prostate cancer Father   . Diabetes Father   . Hypertension Father   . Diabetes Mother   . Heart disease Mother   . Arthritis Mother   . Hyperlipidemia Mother   . Hypertension Mother   . Parkinson's disease Mother   . Thyroid disease Mother   . Diabetes Paternal Grandmother   . Arthritis Paternal Grandmother   . Hyperlipidemia Paternal Grandmother   . Heart disease Paternal Grandmother   . Dementia Paternal Grandmother   . Hypertension Paternal Grandmother   . Hyperlipidemia Maternal Grandmother   . Hypertension Maternal Grandmother   . Hyperlipidemia Maternal Grandfather   . Hypertension Maternal Grandfather   . Hyperlipidemia Paternal Grandfather   . Hypertension Paternal Grandfather   . Diabetes Paternal Grandfather   . Cholelithiasis Paternal Grandfather   . Colon cancer Neg Hx   . Esophageal cancer Neg Hx   . Stomach cancer Neg Hx   . Rectal cancer Neg Hx     SOCIAL HISTORY: Social History   Tobacco Use    . Smoking status: Never Smoker  . Smokeless tobacco: Never Used  Substance Use Topics  . Alcohol use: No    Allergies  Allergen Reactions  . Ace Inhibitors Anaphylaxis  . Shellfish Allergy Anaphylaxis and Other (See Comments)    Only shrimp  . Januvia [Sitagliptin] Other (See Comments)    Recurrent abdominal pain with elevated pancreas enzymes  . Latex Itching  . Lipitor [Atorvastatin]     Current Outpatient Medications  Medication Sig Dispense Refill  . albuterol (PROVENTIL HFA;VENTOLIN HFA) 108 (90 Base) MCG/ACT inhaler INHALE 2 PUFFS Q 4 TO 6 H PRN  0  . b complex vitamins tablet Take by mouth.    . blood glucose meter kit and supplies KIT Dispense based on patient and insurance preference. Use up to four times daily as directed. (FOR ICD-9 250.00, 250.01). 1 each 0  . Cholecalciferol (VITAMIN D3) 50000 units CAPS Take by mouth.    . Cyanocobalamin (VITAMIN B-12 PO) Take 1 tablet by mouth daily.    Marland Kitchen EPINEPHrine (EPI-PEN) 0.3 mg/0.3 mL DEVI Inject 0.3 mg into the muscle once as needed. For severe allergic reaction    . metFORMIN (GLUCOPHAGE) 500 MG tablet TK 4 TS PO ONCE D WF  0  . olmesartan (BENICAR) 20 MG tablet Take 1 tablet (20 mg total) by mouth daily. 90 tablet 3  . omeprazole (PRILOSEC) 40 MG capsule  take 1 capsule by mouth once daily 90 capsule 3  . triamcinolone (NASACORT) 55 MCG/ACT AERO nasal inhaler Place 2 sprays into the nose daily. 1 Inhaler 12  . triamterene-hydrochlorothiazide (MAXZIDE-25) 37.5-25 MG tablet Take 1 tablet by mouth daily. 90 tablet 3  . Vitamin Mixture (VITAMIN E COMPLETE PO) Take by mouth.    Marland Kitchen aspirin EC 81 MG tablet Take 1 tablet (81 mg total) by mouth daily. (Patient not taking: Reported on 09/09/2017)    . azithromycin (ZITHROMAX) 250 MG tablet Take 2 tabs PO x 1 dose, then 1 tab PO QD x 4 days (Patient not taking: Reported on 09/09/2017) 6 tablet 0  . benzonatate (TESSALON) 200 MG capsule Take 1 capsule (200 mg total) by mouth 3 (three) times  daily as needed for cough. (Patient not taking: Reported on 09/09/2017) 60 capsule 0  . dapagliflozin propanediol (FARXIGA) 5 MG TABS tablet Take 5 mg by mouth daily. (Patient not taking: Reported on 09/09/2017) 30 tablet 5  . guaiFENesin-codeine 100-10 MG/5ML syrup Take 5-10 mLs by mouth every 4 (four) hours as needed for cough. Sedation, and sinus pain in evening (Patient not taking: Reported on 09/09/2017) 120 mL 0  . neomycin-polymyxin b-dexamethasone (MAXITROL) 3.5-10000-0.1 OINT APPLY 1 FILM BY OPTHALMIC ROUTE TWICE DAILY INTO AFFECTED EYES  0  . Pitavastatin Calcium (LIVALO) 1 MG TABS Take 1 tablet (1 mg total) by mouth daily. (Patient not taking: Reported on 09/09/2017) 90 tablet 1   No current facility-administered medications for this visit.     REVIEW OF SYSTEMS:  _0  denotes positive finding, _1  denotes negative finding Cardiac  Comments:  Chest pain or chest pressure:    Shortness of breath upon exertion:    Short of breath when lying flat:    Irregular heart rhythm:        Vascular    Pain in calf, thigh, or hip brought on by ambulation:    Pain in feet at night that wakes you up from your sleep:     Blood clot in your veins:    Leg swelling:  x         PHYSICAL EXAM: Vitals:   09/09/17 0908 09/09/17 0911  BP: (!) 155/95 (!) 150/90  Pulse: 73   Resp: 16   Temp: (!) 97.5 F (36.4 C)   SpO2: 92%   Weight: 275 lb (124.7 kg)   Height: _2  (1.778 m)     GENERAL: The patient is a well-nourished female, in no acute distress. The vital signs are documented above. CARDIOVASCULAR: Palpable dorsalis pedis pulses bilaterally PULMONARY: There is good air exchange  MUSCULOSKELETAL: There are no major deformities or cyanosis. NEUROLOGIC: No focal weakness or paresthesias are detected. SKIN: There are no ulcers or rashes noted.  Multiple large raised telangiectasia bilaterally with some early hemosiderin deposit around her ankle on the right PSYCHIATRIC: The patient has a  normal affect.  DATA:  I reviewed her venous duplex from 07/01/2017.  This shows enlargement and reflux throughout her great saphenous vein and this does extend into her varicosities.  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  I have recommended laser ablation of her right great saphenous vein and stab phlebectomy of multiple tributary varicosities.  Also recommend sclerotherapy in these large raised telangiectasia for relief of pain and also reduce risk for bleeding.  She understands this is an outpatient procedure and the slight risk  of DVT associated with the procedure    Rosetta Posner, MD [facility]ists of [facility] Tel [phone] Pager [phone]

## 2017-09-10 ENCOUNTER — Ambulatory Visit: Payer: 59 | Admitting: Family Medicine

## 2017-09-11 ENCOUNTER — Other Ambulatory Visit: Payer: Self-pay | Admitting: *Deleted

## 2017-09-11 DIAGNOSIS — I83893 Varicose veins of bilateral lower extremities with other complications: Secondary | ICD-10-CM

## 2017-10-15 ENCOUNTER — Other Ambulatory Visit: Payer: Self-pay | Admitting: Family Medicine

## 2017-10-16 ENCOUNTER — Encounter: Payer: Self-pay | Admitting: Vascular Surgery

## 2017-10-16 ENCOUNTER — Ambulatory Visit (INDEPENDENT_AMBULATORY_CARE_PROVIDER_SITE_OTHER): Payer: 59 | Admitting: Vascular Surgery

## 2017-10-16 VITALS — BP 148/88 | HR 84 | Temp 97.6°F | Resp 16 | Ht 70.0 in | Wt 277.0 lb

## 2017-10-16 DIAGNOSIS — I83893 Varicose veins of bilateral lower extremities with other complications: Secondary | ICD-10-CM

## 2017-10-16 NOTE — Progress Notes (Signed)
     Laser Ablation Procedure    Date: 10/16/2017   Maria Sharp DOB:1965-05-20  Consent signed: Yes    Surgeon:  Dr. Sherren Mocha Calli Bashor  Procedure: Laser Ablation: right Greater Saphenous Vein  BP (!) 148/88   Pulse 84   Temp 97.6 F (36.4 C)   Resp 16   Ht 5\' 10"  (1.778 m)   Wt 277 lb (125.6 kg)   SpO2 99%   BMI 39.75 kg/m   Tumescent Anesthesia: 500 cc 0.9% NaCl with 50 cc Lidocaine HCL with 1% Epi and 15 cc 8.4% NaHCO3  Local Anesthesia: 6 cc Lidocaine HCL and NaHCO3 (ratio 2:1)  15 watts continuous mode        Total energy: 2459   Total time: 2:43    Stab Phlebectomy: 10-20 Sites: Thigh and Calf  Patient tolerated procedure well  Notes:   Description of Procedure:  After marking the course of the secondary varicosities, the patient was placed on the operating table in the supine position, and the right leg was prepped and draped in sterile fashion.   Local anesthetic was administered and under ultrasound guidance the saphenous vein was accessed with a micro needle and guide wire; then the mirco puncture sheath was placed.  A guide wire was inserted saphenofemoral junction , followed by a 5 french sheath.  The position of the sheath and then the laser fiber below the junction was confirmed using the ultrasound.  Tumescent anesthesia was administered along the course of the saphenous vein using ultrasound guidance. The patient was placed in Trendelenburg position and protective laser glasses were placed on patient and staff, and the laser was fired at 15 watts continuous mode advancing 1-88mm/second for a total of 2459 joules.   For stab phlebectomies, local anesthetic was administered at the previously marked varicosities, and tumescent anesthesia was administered around the vessels.  Ten to 20 stab wounds were made using the tip of an 11 blade. And using the vein hook, the phlebectomies were performed using a hemostat to avulse the varicosities.  Adequate hemostasis was  achieved.   Diamond Nickel strips were applied to the stab wounds and ABD pads and thigh high compression stockings were applied.  Ace wrap bandages were applied over the phlebectomy sites and at the top of the saphenofemoral junction. Blood loss was less than 15 cc.  The patient ambulated out of the operating room having tolerated the procedure well.  Ablation great saphenous vein from mid calf to just below the saphenofemoral junction.  Stab phlebectomy of varicosities in the medial calf and lateral lower thigh.  Follow-up in 1 week

## 2017-10-17 ENCOUNTER — Encounter: Payer: Self-pay | Admitting: Vascular Surgery

## 2017-10-20 ENCOUNTER — Ambulatory Visit: Payer: 59 | Admitting: Family Medicine

## 2017-10-20 ENCOUNTER — Encounter: Payer: Self-pay | Admitting: Family Medicine

## 2017-10-20 VITALS — BP 126/84 | HR 86 | Temp 98.7°F | Resp 16 | Ht 70.0 in | Wt 277.0 lb

## 2017-10-20 DIAGNOSIS — I10 Essential (primary) hypertension: Secondary | ICD-10-CM | POA: Diagnosis not present

## 2017-10-20 DIAGNOSIS — E785 Hyperlipidemia, unspecified: Secondary | ICD-10-CM | POA: Diagnosis not present

## 2017-10-20 DIAGNOSIS — E1151 Type 2 diabetes mellitus with diabetic peripheral angiopathy without gangrene: Secondary | ICD-10-CM

## 2017-10-20 DIAGNOSIS — E559 Vitamin D deficiency, unspecified: Secondary | ICD-10-CM

## 2017-10-20 DIAGNOSIS — E538 Deficiency of other specified B group vitamins: Secondary | ICD-10-CM

## 2017-10-20 LAB — POCT URINALYSIS DIP (MANUAL ENTRY)
Bilirubin, UA: NEGATIVE
Glucose, UA: NEGATIVE mg/dL
Ketones, POC UA: NEGATIVE mg/dL
Leukocytes, UA: NEGATIVE
Nitrite, UA: NEGATIVE
Protein Ur, POC: NEGATIVE mg/dL
Spec Grav, UA: 1.025 (ref 1.010–1.025)
Urobilinogen, UA: 0.2 E.U./dL
pH, UA: 5.5 (ref 5.0–8.0)

## 2017-10-20 LAB — POCT GLYCOSYLATED HEMOGLOBIN (HGB A1C): Hemoglobin A1C: 6.8 % — AB (ref 4.0–5.6)

## 2017-10-20 MED ORDER — TRIAMTERENE-HCTZ 37.5-25 MG PO TABS: 1.0000 | ORAL_TABLET | Freq: Every day | ORAL | 3 refills | Status: DC

## 2017-10-20 MED ORDER — METFORMIN HCL ER 500 MG PO TB24: 500.0000 mg | ORAL_TABLET | Freq: Every day | ORAL | 3 refills | Status: DC

## 2017-10-20 MED ORDER — HYDROCODONE-ACETAMINOPHEN 5-325 MG PO TABS: 1.0000 | ORAL_TABLET | Freq: Four times a day (QID) | ORAL | 0 refills | Status: DC | PRN

## 2017-10-20 MED ORDER — OLMESARTAN MEDOXOMIL 20 MG PO TABS: 20.0000 mg | ORAL_TABLET | Freq: Every day | ORAL | 3 refills | Status: DC

## 2017-10-20 MED ORDER — METFORMIN HCL ER (OSM) 1000 MG PO TB24: 2000.0000 mg | ORAL_TABLET | Freq: Every day | ORAL | 1 refills | Status: DC

## 2017-10-20 NOTE — Progress Notes (Signed)
Subjective:    Patient ID: Maria Sharp, female    DOB: 1966-03-19, 52 y.o.   MRN: [id number] Chief Complaint  Patient presents with  . Diabetes    4 month check  . Medication Problem    pt would like to discuss pain med for her leg/ pt also needs refills on all meds    HPI  SHe transitioned t the metforom fromthe farxiga and she is feeling better. CBGs are 100 in a.m. Fasting but not checking daily.  Will start taking the livalo - just forgot   Taking vitamin D 1000/d she thinks.   She is also taking the b12 1000 mcg daily every day which she is chewing or dissolving under taking  Past Medical History:  Diagnosis Date  . Allergy   . Asthma   . Diabetes mellitus   . Fatty liver   . Gastritis   . Gastroparesis    study 11 14  nl abd [place]  . GERD (gastroesophageal reflux disease)   . High cholesterol   . History of varicella   . Hypertension   . IBS (irritable bowel syndrome)    Past Surgical History:  Procedure Laterality Date  . ANAL RECTAL MANOMETRY N/A 04/03/2015   Procedure: ANO RECTAL MANOMETRY;  Surgeon: Mauri Pole, MD;  Location: [facility];  Service: Endoscopy;  Laterality: N/A;  . CHOLECYSTECTOMY    . WISDOM TOOTH EXTRACTION     Current Outpatient Medications on File Prior to Visit  Medication Sig Dispense Refill  . albuterol (PROVENTIL HFA;VENTOLIN HFA) 108 (90 Base) MCG/ACT inhaler INHALE 2 PUFFS Q 4 TO 6 H PRN  0  . blood glucose meter kit and supplies KIT Dispense based on patient and insurance preference. Use up to four times daily as directed. (FOR ICD-9 250.00, 250.01). 1 each 0  . EPINEPHrine (EPI-PEN) 0.3 mg/0.3 mL DEVI Inject 0.3 mg into the muscle once as needed. For severe allergic reaction    . omeprazole (PRILOSEC) 40 MG capsule take 1 capsule by mouth once daily 90 capsule 3  . triamcinolone (NASACORT) 55 MCG/ACT AERO nasal inhaler Place 2 sprays into the nose daily. 1 Inhaler 12  . Vitamin Mixture (VITAMIN E COMPLETE PO) Take by  mouth.    . Pitavastatin Calcium (LIVALO) 1 MG TABS Take 1 tablet (1 mg total) by mouth daily. (Patient not taking: Reported on 01/13/2018) 90 tablet 1   No current facility-administered medications on file prior to visit.    Allergies  Allergen Reactions  . Ace Inhibitors Anaphylaxis  . Shellfish Allergy Anaphylaxis and Other (See Comments)    Only shrimp  . Januvia [Sitagliptin] Other (See Comments)    Recurrent abdominal pain with elevated pancreas enzymes  . Latex Itching  . Lipitor [Atorvastatin]    Family History  Problem Relation Age of Onset  . Prostate cancer Father   . Diabetes Father   . Hypertension Father   . Diabetes Mother   . Heart disease Mother   . Arthritis Mother   . Hyperlipidemia Mother   . Hypertension Mother   . Parkinson's disease Mother   . Thyroid disease Mother   . Diabetes Paternal Grandmother   . Arthritis Paternal Grandmother   . Hyperlipidemia Paternal Grandmother   . Heart disease Paternal Grandmother   . Dementia Paternal Grandmother   . Hypertension Paternal Grandmother   . Hyperlipidemia Maternal Grandmother   . Hypertension Maternal Grandmother   . Hyperlipidemia Maternal Grandfather   . Hypertension Maternal  Grandfather   . Hyperlipidemia Paternal Grandfather   . Hypertension Paternal Grandfather   . Diabetes Paternal Grandfather   . Cholelithiasis Paternal Grandfather   . Colon cancer Neg Hx   . Esophageal cancer Neg Hx   . Stomach cancer Neg Hx   . Rectal cancer Neg Hx    Social History   Socioeconomic History  . Marital status: Single    Spouse name: Not on file  . Number of children: 0  . Years of education: Not on file  . Highest education level: Not on file  Occupational History  . Occupation: Marine scientist  Social Needs  . Financial resource strain: Not on file  . Food insecurity:    Worry: Not on file    Inability: Not on file  . Transportation needs:    Medical: Not on file    Non-medical: Not on file  Tobacco  Use  . Smoking status: Never Smoker  . Smokeless tobacco: Never Used  Substance and Sexual Activity  . Alcohol use: No  . Drug use: No  . Sexual activity: Not on file  Lifestyle  . Physical activity:    Days per week: Not on file    Minutes per session: Not on file  . Stress: Not on file  Relationships  . Social connections:    Talks on phone: Not on file    Gets together: Not on file    Attends religious service: Not on file    Active member of club or organization: Not on file    Attends meetings of clubs or organizations: Not on file    Relationship status: Not on file  Other Topics Concern  . Not on file  Social History Narrative   Usually # of hours of sleep per night: 6   3 of people living at your residence? 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   Irregular sleep 7:30 to 11 or 12 and then 4 to 6:30 before she goes to work   Has attended college.   Originally from [place]   Abnormal pap remote last 2012    Depression screen [facility] 2/9 01/13/2018 11/13/2017 10/20/2017 06/23/2017 02/17/2017  Decreased Interest 0 0 0 0 0  Down, Depressed, Hopeless 0 0 0 0 0  PHQ - 2 Score 0 0 0 0 0      Review of Systems See hpi    Objective:   Physical Exam  Constitutional: She is oriented to person, place, and time. She appears well-developed and well-nourished. No distress.  HENT:  Head: Normocephalic and atraumatic.  Right Ear: External ear normal.  Left Ear: External ear normal.  Eyes: Conjunctivae are normal. No scleral icterus.  Neck: Normal range of motion. Neck supple. Thyromegaly present.  Cardiovascular: Normal rate, regular rhythm, normal heart sounds and intact distal pulses.  Pulmonary/Chest: Effort normal and breath sounds normal. No respiratory distress.  Musculoskeletal: She exhibits no edema.  Lymphadenopathy:    She has no cervical adenopathy.   Neurological: She is alert and oriented to person, place, and time.  Skin: Skin is warm and dry. She is not diaphoretic. No erythema.  Psychiatric: She has a normal mood and affect. Her behavior is normal.   BP 126/84   Pulse 86   Temp 98.7 F (37.1 C) (Oral)   Resp 16  Ht 5' 10"  (1.778 m)   Wt 277 lb (125.6 kg)   LMP  (LMP Unknown)   SpO2 98%   BMI 39.75 kg/m   Lab Results  Component Value Date   HGBA1C 6.8 (A) 10/20/2017   HGBA1C 7.0 06/23/2017   HGBA1C 7.1 02/17/2017   Urinalysis Component Value   COLORURINE YELLOW   APPEARANCEUR CLEAR   LABSPEC 1.025   PHURINE 5.5   GLUCOSEU NEGATIVE   HGBUR MODERATE   BILIRUBINUR Negative   KETONESUR negative   PROTEINUR NEGATIVE   UROBILINOGEN 0.2   NITRITE NEGATIVE   LEUKOCYTESUR Negative       Assessment & Plan:  Needs ophtho exam - ordered at last DM visit - 06/23/17 Has not scheduled yet but she will today 1. Type 2 diabetes mellitus with peripheral circulatory disorder (HCC)   2. Essential hypertension   3. Hyperlipidemia LDL goal <100 - agrees to start trial of livalo  4. Vitamin D deficiency   5. Vitamin B12 deficiency     Orders Placed This Encounter  Procedures  . Lipid panel    Order Specific Question:   Has the patient fasted?    Answer:   Yes  . Comprehensive metabolic panel    Order Specific Question:   Has the patient fasted?    Answer:   Yes  . Microalbumin/Creatinine Ratio, Urine  . VITAMIN D 25 Hydroxy (Vit-D Deficiency, Fractures)  . Vitamin B12  . POCT glycosylated hemoglobin (Hb A1C)  . POCT urinalysis dipstick    Meds ordered this encounter  Medications  . triamterene-hydrochlorothiazide (MAXZIDE-25) 37.5-25 MG tablet    Sig: Take 1 tablet by mouth daily.    Dispense:  90 tablet    Refill:  3  . DISCONTD: metFORMIN (GLUCOPHAGE-XR) 500 MG 24 hr tablet    Sig: Take 1 tablet (500 mg total) by mouth daily with breakfast.    Dispense:  360 tablet    Refill:  3  . olmesartan (BENICAR) 20  MG tablet    Sig: Take 1 tablet (20 mg total) by mouth daily.    Dispense:  90 tablet    Refill:  3  . metformin (FORTAMET) 1000 MG (OSM) 24 hr tablet    Sig: Take 2 tablets (2,000 mg total) by mouth daily with breakfast.    Dispense:  180 tablet    Refill:  1    D/C rx for metformin 500 whenever insurance authorizes this 105m ER dose.  .Marland KitchenHYDROcodone-acetaminophen (NORCO/VICODIN) 5-325 MG tablet    Sig: Take 1 tablet by mouth every 6 (six) hours as needed for moderate pain.    Dispense:  20 tablet    Refill:  0    EDelman Cheadle MD, MPH [facility] [place]  [phone] phone  [phone] fax   01/18/18 2:33 AM

## 2017-10-20 NOTE — Patient Instructions (Addendum)
IF you received an x-ray today, you will receive an invoice from [facility]. Please contact [facility] at [phone] with questions or concerns regarding your invoice.   IF you received labwork today, you will receive an invoice from [facility]. Please contact [facility] at [phone] with questions or concerns regarding your invoice.   Our billing staff will not be able to assist you with questions regarding bills from these companies.  You will be contacted with the lab results as soon as they are available. The fastest way to get your results is to activate your My Chart account. Instructions are located on the last page of this paperwork. If you have not heard from [place] regarding the results in 2 weeks, please contact this office.      Diabetes Mellitus and Exercise Exercising regularly is important for your overall health, especially when you have diabetes (diabetes mellitus). Exercising is not only about losing weight. It has many health benefits, such as increasing muscle strength and bone density and reducing body fat and stress. This leads to improved fitness, flexibility, and endurance, all of which result in better overall health. Exercise has additional benefits for people with diabetes, including:  Reducing appetite.  Helping to lower and control blood glucose.  Lowering blood pressure.  Helping to control amounts of fatty substances (lipids) in the blood, such as cholesterol and triglycerides.  Helping the body to respond better to insulin (improving insulin sensitivity).  Reducing how much insulin the body needs.  Decreasing the risk for heart disease by: ? Lowering cholesterol and triglyceride levels. ? Increasing the levels of good cholesterol. ? Lowering blood glucose levels.  What is my activity plan? Your health care provider or certified diabetes educator can help you make a plan for the type and frequency of exercise (activity plan)  that works for you. Make sure that you:  Do at least 150 minutes of moderate-intensity or vigorous-intensity exercise each week. This could be brisk walking, biking, or water aerobics. ? Do stretching and strength exercises, such as yoga or weightlifting, at least 2 times a week. ? Spread out your activity over at least 3 days of the week.  Get some form of physical activity every day. ? Do not go more than 2 days in a row without some kind of physical activity. ? Avoid being inactive for more than 90 minutes at a time. Take frequent breaks to walk or stretch.  Choose a type of exercise or activity that you enjoy, and set realistic goals.  Start slowly, and gradually increase the intensity of your exercise over time.  What do I need to know about managing my diabetes?  Check your blood glucose before and after exercising. ? If your blood glucose is higher than 240 mg/dL (13.3 mmol/L) before you exercise, check your urine for ketones. If you have ketones in your urine, do not exercise until your blood glucose returns to normal.  Know the symptoms of low blood glucose (hypoglycemia) and how to treat it. Your risk for hypoglycemia increases during and after exercise. Common symptoms of hypoglycemia can include: ? Hunger. ? Anxiety. ? Sweating and feeling clammy. ? Confusion. ? Dizziness or feeling light-headed. ? Increased heart rate or palpitations. ? Blurry vision. ? Tingling or numbness around the mouth, lips, or tongue. ? Tremors or shakes. ? Irritability.  Keep a rapid-acting carbohydrate snack available before, during, and after exercise to help prevent or treat hypoglycemia.  Avoid injecting insulin into areas of the  body that are going to be exercised. For example, avoid injecting insulin into: ? The arms, when playing tennis. ? The legs, when jogging.  Keep records of your exercise habits. Doing this can help you and your health care provider adjust your diabetes management  plan as needed. Write down: ? Food that you eat before and after you exercise. ? Blood glucose levels before and after you exercise. ? The type and amount of exercise you have done. ? When your insulin is expected to peak, if you use insulin. Avoid exercising at times when your insulin is peaking.  When you start a new exercise or activity, work with your health care provider to make sure the activity is safe for you, and to adjust your insulin, medicines, or food intake as needed.  Drink plenty of water while you exercise to prevent dehydration or heat stroke. Drink enough fluid to keep your urine clear or pale yellow. This information is not intended to replace advice given to you by your health care provider. Make sure you discuss any questions you have with your health care provider. Document Released: 06/29/2003 Document Revised: 10/27/2015 Document Reviewed: 09/18/2015 Elsevier Interactive Patient Education  2018 [employer].

## 2017-10-21 ENCOUNTER — Encounter: Payer: Self-pay | Admitting: Vascular Surgery

## 2017-10-21 ENCOUNTER — Ambulatory Visit (HOSPITAL_COMMUNITY)
Admission: RE | Admit: 2017-10-21 | Discharge: 2017-10-21 | Disposition: A | Discharge status: Home / Self Care | Payer: 59 | Source: Ambulatory Visit | Attending: Vascular Surgery | Admitting: Vascular Surgery

## 2017-10-21 ENCOUNTER — Ambulatory Visit (INDEPENDENT_AMBULATORY_CARE_PROVIDER_SITE_OTHER): Payer: 59 | Admitting: Vascular Surgery

## 2017-10-21 VITALS — BP 132/82 | HR 77 | Temp 97.6°F | Resp 16 | Ht 70.0 in | Wt 275.0 lb

## 2017-10-21 DIAGNOSIS — I83893 Varicose veins of bilateral lower extremities with other complications: Secondary | ICD-10-CM | POA: Diagnosis not present

## 2017-10-21 LAB — COMPREHENSIVE METABOLIC PANEL
ALT: 10 IU/L (ref 0–32)
AST: 5 IU/L (ref 0–40)
Albumin/Globulin Ratio: 1.2 (ref 1.2–2.2)
Albumin: 3.9 g/dL (ref 3.5–5.5)
Alkaline Phosphatase: 58 IU/L (ref 39–117)
BUN/Creatinine Ratio: 20 (ref 9–23)
BUN: 13 mg/dL (ref 6–24)
Bilirubin Total: 0.3 mg/dL (ref 0.0–1.2)
CO2: 27 mmol/L (ref 20–29)
Calcium: 9.6 mg/dL (ref 8.7–10.2)
Chloride: 100 mmol/L (ref 96–106)
Creatinine, Ser: 0.64 mg/dL (ref 0.57–1.00)
GFR calc Af Amer: 119 mL/min/{1.73_m2} (ref >59)
GFR calc non Af Amer: 104 mL/min/{1.73_m2} (ref >59)
Globulin, Total: 3.2 g/dL (ref 1.5–4.5)
Glucose: 125 mg/dL — ABNORMAL HIGH (ref 65–99)
Potassium: 3.8 mmol/L (ref 3.5–5.2)
Sodium: 138 mmol/L (ref 134–144)
Total Protein: 7.1 g/dL (ref 6.0–8.5)

## 2017-10-21 LAB — LIPID PANEL
Chol/HDL Ratio: 4 ratio (ref 0.0–4.4)
Cholesterol, Total: 212 mg/dL — ABNORMAL HIGH (ref 100–199)
HDL: 53 mg/dL (ref >39)
LDL Calculated: 129 mg/dL — ABNORMAL HIGH (ref 0–99)
Triglycerides: 148 mg/dL (ref 0–149)
VLDL Cholesterol Cal: 30 mg/dL (ref 5–40)

## 2017-10-21 LAB — VITAMIN D 25 HYDROXY (VIT D DEFICIENCY, FRACTURES): Vit D, 25-Hydroxy: 21.1 ng/mL — ABNORMAL LOW (ref 30.0–100.0)

## 2017-10-21 LAB — MICROALBUMIN / CREATININE URINE RATIO
Creatinine, Urine: 111.3 mg/dL
Microalb/Creat Ratio: 7.2 mg/g creat (ref 0.0–30.0)
Microalbumin, Urine: 8 ug/mL

## 2017-10-21 LAB — VITAMIN B12: Vitamin B-12: 528 pg/mL (ref 232–1245)

## 2017-10-21 NOTE — Progress Notes (Signed)
   Patient name: Maria Sharp MRN: [id number] DOB: 06/29/1965 Sex: female  REASON FOR VISIT: Follow-up right great saphenous vein ablation and stab phlebectomy tributary varicosities  HPI: Maria Sharp is a 52 y.o. female today for follow-up.  She underwent ablation 5 days ago.  She has had more than the typical amount of discomfort in the posterior thigh near the lateral knee region.  There is no bruising noted.  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  Current Outpatient Medications  Medication Sig Dispense Refill  . albuterol (PROVENTIL HFA;VENTOLIN HFA) 108 (90 Base) MCG/ACT inhaler INHALE 2 PUFFS Q 4 TO 6 H PRN  0  . b complex vitamins tablet Take 1 tablet by mouth daily. Chewable or dissolvable under tongue; contains 1087mg of B12 in ti.    . blood glucose meter kit and supplies KIT Dispense based on patient and insurance preference. Use up to four times daily as directed. (FOR ICD-9 250.00, 250.01). 1 each 0  . cholecalciferol (VITAMIN D) 1000 units tablet Take 1,000 Units by mouth daily.    .Marland KitchenEPINEPHrine (EPI-PEN) 0.3 mg/0.3 mL DEVI Inject 0.3 mg into the muscle once as needed. For severe allergic reaction    . HYDROcodone-acetaminophen (NORCO/VICODIN) 5-325 MG tablet Take 1 tablet by mouth every 6 (six) hours as needed for moderate pain. 20 tablet 0  . metformin (FORTAMET) 1000 MG (OSM) 24 hr tablet Take 2 tablets (2,000 mg total) by mouth daily with breakfast. 180 tablet 1  . olmesartan (BENICAR) 20 MG tablet Take 1 tablet (20 mg total) by mouth daily. 90 tablet 3  . omeprazole (PRILOSEC) 40 MG capsule take 1 capsule by mouth once daily 90 capsule 3  . Pitavastatin Calcium (LIVALO) 1 MG TABS Take 1 tablet (1 mg total) by mouth daily. (Patient not taking: Reported on 09/09/2017) 90 tablet 1  . triamcinolone (NASACORT) 55 MCG/ACT AERO nasal inhaler Place 2 sprays into the nose daily. 1  Inhaler 12  . triamterene-hydrochlorothiazide (MAXZIDE-25) 37.5-25 MG tablet Take 1 tablet by mouth daily. 90 tablet 3  . Vitamin Mixture (VITAMIN E COMPLETE PO) Take by mouth.     No current facility-administered medications for this visit.      PHYSICAL EXAM: Vitals:   10/21/17 1126  BP: 132/82  Pulse: 77  Resp: 16  Temp: 97.6 F (36.4 C)  SpO2: 99%  Weight: 275 lb (124.7 kg)  Height: _0  (1.778 m)    GENERAL: The patient is a well-nourished female, in no acute distress. The vital signs are documented above. Minimal bruising and the expected erythema over the ablation site.  Excellent result from stab phlebectomy  Venous duplex reveals no evidence of DVT with occlusion up to the level approximately 2-1/2 cm from the saphenofemoral junction  MEDICAL ISSUES: Successful ablation.  She will continue ibuprofen for discomfort and pression for 1 additional week.  She will return in September for sclerotherapy of the painful pronounced telangiectasia   TRosetta Posner MD [facility] ([phone] ([phone]

## 2017-11-03 ENCOUNTER — Telehealth: Payer: Self-pay | Admitting: *Deleted

## 2017-11-03 NOTE — Telephone Encounter (Signed)
Pa for metformin [place] [id number]

## 2017-11-11 ENCOUNTER — Encounter: Payer: Self-pay | Admitting: Sports Medicine

## 2017-11-11 ENCOUNTER — Ambulatory Visit: Payer: 59 | Admitting: Sports Medicine

## 2017-11-11 DIAGNOSIS — E119 Type 2 diabetes mellitus without complications: Secondary | ICD-10-CM | POA: Diagnosis not present

## 2017-11-11 DIAGNOSIS — I739 Peripheral vascular disease, unspecified: Secondary | ICD-10-CM | POA: Diagnosis not present

## 2017-11-11 DIAGNOSIS — B351 Tinea unguium: Secondary | ICD-10-CM | POA: Diagnosis not present

## 2017-11-11 DIAGNOSIS — M79609 Pain in unspecified limb: Secondary | ICD-10-CM

## 2017-11-11 NOTE — Progress Notes (Signed)
Subjective: Maria Sharp is a 52 y.o. female patient with history of diabetes who presents to office today complaining of long,mildly painful nails  while ambulating in shoes; unable to trim. Patient states that the glucose reading this morning was not recorded. Patient denies any new changes in medication or new problems.  A1c 6.8, 3 weeks ago  Patient Active Problem List   Diagnosis Date Noted  . Migraines 09/02/2017  . DJD (degenerative joint disease) 09/02/2017  . Vertigo 12/05/2016  . Bilateral temporomandibular joint pain 12/05/2016  . Constipation   . Asthma, chronic 09/28/2014  . Gastroparesis   . Varicose veins 09/14/2013  . Neck pain on left side 06/14/2013  . Current non-adherence to medical treatment 06/14/2013  . Vitamin D deficiency 10/05/2012  . Leg cramps 10/05/2012  . Family history of diabetes mellitus 10/05/2012  . Type 2 diabetes mellitus, uncontrolled ([place]) 05/28/2012  . 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 ABDOMINAL PAIN, UNSPECIFIED SITE 05/18/2010  . CHEST PAIN, INTERMITTENT 05/12/2009  . Obesity 04/05/2009  . CARDIOMEGALY 04/05/2009  . PALPITATIONS 04/05/2009  . Type 2 diabetes mellitus with peripheral circulatory disorder ([place]) 03/15/2009  . BACK PAIN 03/09/2008  . Hyperlipidemia LDL goal <100 03/03/2008  . Essential hypertension 03/03/2008  . GERD 03/03/2008  . IRRITABLE BOWEL SYNDROME 03/03/2008  . RECTAL BLEEDING 03/03/2008   Current Outpatient Medications on File Prior to Visit  Medication Sig Dispense Refill  . albuterol (PROVENTIL HFA;VENTOLIN HFA) 108 (90 Base) MCG/ACT inhaler INHALE 2 PUFFS Q 4 TO 6 H PRN  0  . b complex vitamins tablet Take 1 tablet by mouth daily. Chewable or dissolvable under tongue; contains 1011mg of B12 in ti.    . blood glucose meter kit and supplies KIT Dispense based on patient and insurance preference. Use up to four times daily as directed. (FOR ICD-9 250.00, 250.01). 1 each 0  . cholecalciferol (VITAMIN D)  1000 units tablet Take 1,000 Units by mouth daily.    .Marland KitchenEPINEPHrine (EPI-PEN) 0.3 mg/0.3 mL DEVI Inject 0.3 mg into the muscle once as needed. For severe allergic reaction    . EPINEPHrine 0.3 mg/0.3 mL IJ SOAJ injection INJECT INTRAMUSCULARLY AS DIRECTED  1  . HYDROcodone-acetaminophen (NORCO/VICODIN) 5-325 MG tablet Take 1 tablet by mouth every 6 (six) hours as needed for moderate pain. 20 tablet 0  . metformin (FORTAMET) 1000 MG (OSM) 24 hr tablet Take 2 tablets (2,000 mg total) by mouth daily with breakfast. 180 tablet 1  . olmesartan (BENICAR) 20 MG tablet Take 1 tablet (20 mg total) by mouth daily. 90 tablet 3  . omeprazole (PRILOSEC) 40 MG capsule take 1 capsule by mouth once daily 90 capsule 3  . Pitavastatin Calcium (LIVALO) 1 MG TABS Take 1 tablet (1 mg total) by mouth daily. 90 tablet 1  . triamcinolone (NASACORT) 55 MCG/ACT AERO nasal inhaler Place 2 sprays into the nose daily. 1 Inhaler 12  . triamterene-hydrochlorothiazide (MAXZIDE-25) 37.5-25 MG tablet Take 1 tablet by mouth daily. 90 tablet 3  . Vitamin Mixture (VITAMIN E COMPLETE PO) Take by mouth.     No current facility-administered medications on file prior to visit.    Allergies  Allergen Reactions  . Ace Inhibitors Anaphylaxis  . Shellfish Allergy Anaphylaxis and Other (See Comments)    Only shrimp  . Januvia [Sitagliptin] Other (See Comments)    Recurrent abdominal pain with elevated pancreas enzymes  . Latex Itching  . Lipitor [Atorvastatin]     Recent Results (from the past 2160 hour(s))  Lipid panel     Status: Abnormal   Collection Time: 10/20/17  8:25 AM  Result Value Ref Range   Cholesterol, Total 212 (H) 100 - 199 mg/dL   Triglycerides 148 0 - 149 mg/dL   HDL 53 >39 mg/dL   VLDL Cholesterol Cal 30 5 - 40 mg/dL   LDL Calculated 129 (H) 0 - 99 mg/dL   Chol/HDL Ratio 4.0 0.0 - 4.4 ratio    Comment:                                   T. Chol/HDL Ratio                                             Men   Women                               1/2 Avg.Risk  3.4    3.3                                   Avg.Risk  5.0    4.4                                2X Avg.Risk  9.6    7.1                                3X Avg.Risk 23.4   11.0   Comprehensive metabolic panel     Status: Abnormal   Collection Time: 10/20/17  8:25 AM  Result Value Ref Range   Glucose 125 (H) 65 - 99 mg/dL   BUN 13 6 - 24 mg/dL   Creatinine, Ser 0.64 0.57 - 1.00 mg/dL   GFR calc non Af Amer 104 >59 mL/min/1.73   GFR calc Af Amer 119 >59 mL/min/1.73   BUN/Creatinine Ratio 20 9 - 23   Sodium 138 134 - 144 mmol/L   Potassium 3.8 3.5 - 5.2 mmol/L   Chloride 100 96 - 106 mmol/L   CO2 27 20 - 29 mmol/L   Calcium 9.6 8.7 - 10.2 mg/dL   Total Protein 7.1 6.0 - 8.5 g/dL   Albumin 3.9 3.5 - 5.5 g/dL   Globulin, Total 3.2 1.5 - 4.5 g/dL   Albumin/Globulin Ratio 1.2 1.2 - 2.2   Bilirubin Total 0.3 0.0 - 1.2 mg/dL   Alkaline Phosphatase 58 39 - 117 IU/L   AST 5 0 - 40 IU/L   ALT 10 0 - 32 IU/L  VITAMIN D 25 Hydroxy (Vit-D Deficiency, Fractures)     Status: Abnormal   Collection Time: 10/20/17  8:25 AM  Result Value Ref Range   Vit D, 25-Hydroxy 21.1 (L) 30.0 - 100.0 ng/mL    Comment: Vitamin D deficiency has been defined by the Institute of Medicine and an Endocrine Society practice guideline as a level of serum 25-OH vitamin D less than 20 ng/mL (1,2). The Endocrine Society went on to further define vitamin D insufficiency as a level between 21 and 29 ng/mL (2). 1. IOM (Institute of Medicine).  2010. Dietary reference    intakes for calcium and D. [place]: The    [employer]. 2. Holick MF, Binkley [place], Bischoff-Ferrari HA, et al.    Evaluation, treatment, and prevention of vitamin D    deficiency: an Endocrine Society clinical practice    guideline. JCEM. 2011 Jul; 96(7):1911-30.   Vitamin B12     Status: None   Collection Time: 10/20/17  8:25 AM  Result Value Ref Range   Vitamin B-12 528 232 - 1,245  pg/mL  POCT glycosylated hemoglobin (Hb A1C)     Status: Abnormal   Collection Time: 10/20/17  8:43 AM  Result Value Ref Range   Hemoglobin A1C 6.8 (A) 4.0 - 5.6 %   HbA1c POC (<> result, manual entry)  4.0 - 5.6 %   HbA1c, POC (prediabetic range)  5.7 - 6.4 %   HbA1c, POC (controlled diabetic range)  0.0 - 7.0 %  POCT urinalysis dipstick     Status: Abnormal   Collection Time: 10/20/17  8:44 AM  Result Value Ref Range   Color, UA yellow yellow   Clarity, UA clear clear   Glucose, UA negative negative mg/dL   Bilirubin, UA negative negative   Ketones, POC UA negative negative mg/dL   Spec Grav, UA 1.025 1.010 - 1.025   Blood, UA moderate (A) negative   pH, UA 5.5 5.0 - 8.0   Protein Ur, POC negative negative mg/dL   Urobilinogen, UA 0.2 0.2 or 1.0 E.U./dL   Nitrite, UA Negative Negative   Leukocytes, UA Negative Negative  Microalbumin/Creatinine Ratio, Urine     Status: None   Collection Time: 10/20/17  9:50 AM  Result Value Ref Range   Creatinine, Urine 111.3 Not Estab. mg/dL   Microalbumin, Urine 8.0 Not Estab. ug/mL   Microalb/Creat Ratio 7.2 0.0 - 30.0 mg/g creat    Comment:                      Normal:                0.0 -  30.0                      Albuminuria:          31.0 - 300.0                      Clinical albuminuria:       >300.0     Objective: General: Patient is awake, alert, and oriented x 3 and in no acute distress.  Integument: Skin is warm, dry and supple bilateral. Nails are tender, long, thickened and  dystrophic with subungual debris, consistent with onychomycosis, 1-5 bilateral. No signs of infection. No open lesions or preulcerative lesions present bilateral. Remaining integument unremarkable.  Vasculature:  Dorsalis Pedis pulse 2/4 bilateral. Posterior Tibial pulse faint 1/4 bilateral.  Capillary fill time <3 sec 1-5 bilateral. Positive hair growth to the level of the digits. Temperature gradient within normal limits. + varicosities present  bilateral. Trace edema present bilateral.   Neurology: The patient has intact sensation measured with a 5.07/10g Semmes Weinstein Monofilament at all pedal sites bilateral. Vibratory sensation slightly diminished bilateral with tuning fork. No Babinski sign present bilateral.   Musculoskeletal: No symptomatic pedal deformities noted bilateral. Muscular strength 5/5 in all lower extremity muscular groups bilateral without pain on range of motion . No tenderness with calf compression bilateral.  Assessment and Plan:  Problem List Items Addressed This Visit    None    Visit Diagnoses    Pain due to onychomycosis of nail    -  Primary   Diabetes mellitus without complication (HCC)       PVD (peripheral vascular disease) (HCC)       Relevant Medications   EPINEPHrine 0.3 mg/0.3 mL IJ SOAJ injection     -Examined patient. -Discussed and educated patient on diabetic foot care, especially with  regards to the vascular, neurological and musculoskeletal systems.  -Stressed the importance of good glycemic control and the detriment of not  controlling glucose levels in relation to the foot. -Mechanically debrided all nails 1-5 bilateral using sterile nail nipper and filed with dremel without incident  -Answered all patient questions -Patient to return  in 2 days for at risk foot care -Patient advised to call the office if any problems or questions arise in the meantime.  Landis Martins, DPM

## 2017-11-13 ENCOUNTER — Ambulatory Visit (INDEPENDENT_AMBULATORY_CARE_PROVIDER_SITE_OTHER): Payer: 59

## 2017-11-13 ENCOUNTER — Encounter: Payer: Self-pay | Admitting: Family Medicine

## 2017-11-13 ENCOUNTER — Ambulatory Visit: Payer: 59 | Admitting: Family Medicine

## 2017-11-13 ENCOUNTER — Other Ambulatory Visit: Payer: Self-pay

## 2017-11-13 VITALS — BP 135/80 | HR 82 | Temp 98.4°F | Ht 70.0 in | Wt 280.2 lb

## 2017-11-13 DIAGNOSIS — R1319 Other dysphagia: Secondary | ICD-10-CM

## 2017-11-13 DIAGNOSIS — R1011 Right upper quadrant pain: Secondary | ICD-10-CM

## 2017-11-13 DIAGNOSIS — R1310 Dysphagia, unspecified: Secondary | ICD-10-CM

## 2017-11-13 DIAGNOSIS — E1151 Type 2 diabetes mellitus with diabetic peripheral angiopathy without gangrene: Secondary | ICD-10-CM

## 2017-11-13 DIAGNOSIS — I10 Essential (primary) hypertension: Secondary | ICD-10-CM | POA: Diagnosis not present

## 2017-11-13 DIAGNOSIS — R102 Pelvic and perineal pain: Secondary | ICD-10-CM | POA: Diagnosis not present

## 2017-11-13 DIAGNOSIS — R1013 Epigastric pain: Secondary | ICD-10-CM

## 2017-11-13 DIAGNOSIS — E559 Vitamin D deficiency, unspecified: Secondary | ICD-10-CM

## 2017-11-13 DIAGNOSIS — K219 Gastro-esophageal reflux disease without esophagitis: Secondary | ICD-10-CM

## 2017-11-13 DIAGNOSIS — R0789 Other chest pain: Secondary | ICD-10-CM

## 2017-11-13 DIAGNOSIS — I517 Cardiomegaly: Secondary | ICD-10-CM | POA: Diagnosis not present

## 2017-11-13 DIAGNOSIS — K5900 Constipation, unspecified: Secondary | ICD-10-CM

## 2017-11-13 LAB — POC MICROSCOPIC URINALYSIS (UMFC): Mucus: ABSENT

## 2017-11-13 LAB — POCT CBC
Granulocyte percent: 60.9 %G (ref 37–80)
HCT, POC: 37.7 % (ref 37.7–47.9)
Hemoglobin: 11.7 g/dL — AB (ref 12.2–16.2)
Lymph, poc: 2.5 (ref 0.6–3.4)
MCH, POC: 25.1 pg — AB (ref 27–31.2)
MCHC: 31 g/dL — AB (ref 31.8–35.4)
MCV: 80.9 fL (ref 80–97)
MID (cbc): 0.4 (ref 0–0.9)
MPV: 6.4 fL (ref 0–99.8)
POC Granulocyte: 4.5 (ref 2–6.9)
POC LYMPH PERCENT: 33.6 %L (ref 10–50)
POC MID %: 5.5 %M (ref 0–12)
Platelet Count, POC: 364 10*3/uL (ref 142–424)
RBC: 4.66 M/uL (ref 4.04–5.48)
RDW, POC: 16.8 %
WBC: 7.4 10*3/uL (ref 4.6–10.2)

## 2017-11-13 LAB — POCT URINALYSIS DIP (MANUAL ENTRY)
Bilirubin, UA: NEGATIVE
Glucose, UA: NEGATIVE mg/dL
Ketones, POC UA: NEGATIVE mg/dL
Leukocytes, UA: NEGATIVE
Nitrite, UA: NEGATIVE
Protein Ur, POC: NEGATIVE mg/dL
Spec Grav, UA: 1.025 (ref 1.010–1.025)
Urobilinogen, UA: 0.2 E.U./dL
pH, UA: 6 (ref 5.0–8.0)

## 2017-11-13 MED ORDER — VITAMIN D 1000 UNITS PO TABS: 4000.0000 [IU] | ORAL_TABLET | Freq: Every day | ORAL | 2 refills | Status: AC

## 2017-11-13 MED ORDER — GI COCKTAIL ~~LOC~~
30.0000 mL | Freq: Once | ORAL | Status: AC
  Administered 2017-11-13: 30 mL via ORAL

## 2017-11-13 MED ORDER — DEXLANSOPRAZOLE 60 MG PO CPDR: 60.0000 mg | DELAYED_RELEASE_CAPSULE | Freq: Every day | ORAL | 1 refills | Status: DC

## 2017-11-13 MED ORDER — LUBIPROSTONE 24 MCG PO CAPS: 24.0000 ug | ORAL_CAPSULE | Freq: Two times a day (BID) | ORAL | 0 refills | Status: DC

## 2017-11-13 NOTE — Patient Instructions (Addendum)
Stop the omeprazole and switch to dexilant instead. We need to fix your constipation - try drinking a bottle of magnesium citrate tonight and then start the Amitiza twice a day.  If you can't get the Amitiza due to insurance issues or it is not working, start a magnesium 400mg  supplement at night and a daily fiber supplement.  IF you received an x-ray today, you will receive an invoice from [facility]. Please contact [facility] at [phone] with questions or concerns regarding your invoice.   IF you received labwork today, you will receive an invoice from [facility]. Please contact [facility] at [phone] with questions or concerns regarding your invoice.   Our billing staff will not be able to assist you with questions regarding bills from these companies.  You will be contacted with the lab results as soon as they are available. The fastest way to get your results is to activate your My Chart account. Instructions are located on the last page of this paperwork. If you have not heard from [place] regarding the results in 2 weeks, please contact this office.      Constipation, Adult Constipation is when a person has fewer bowel movements in a week than normal, has difficulty having a bowel movement, or has stools that are dry, hard, or larger than normal. Constipation may be caused by an underlying condition. It may become worse with age if a person takes certain medicines and does not take in enough fluids. Follow these instructions at home: Eating and drinking   Eat foods that have a lot of fiber, such as fresh fruits and vegetables, whole grains, and beans.  Limit foods that are high in fat, low in fiber, or overly processed, such as french fries, hamburgers, cookies, candies, and soda.  Drink enough fluid to keep your urine clear or pale yellow. General instructions  Exercise regularly or as told by your health care provider.  Go to the restroom when you have the  urge to go. Do not hold it in.  Take over-the-counter and prescription medicines only as told by your health care provider. These include any fiber supplements.  Practice pelvic floor retraining exercises, such as deep breathing while relaxing the lower abdomen and pelvic floor relaxation during bowel movements.  Watch your condition for any changes.  Keep all follow-up visits as told by your health care provider. This is important. Contact a health care provider if:  You have pain that gets worse.  You have a fever.  You do not have a bowel movement after 4 days.  You vomit.  You are not hungry.  You lose weight.  You are bleeding from the anus.  You have thin, pencil-like stools. Get help right away if:  You have a fever and your symptoms suddenly get worse.  You leak stool or have blood in your stool.  Your abdomen is bloated.  You have severe pain in your abdomen.  You feel dizzy or you faint. This information is not intended to replace advice given to you by your health care provider. Make sure you discuss any questions you have with your health care provider. Document Released: 01/05/2004 Document Revised: 10/27/2015 Document Reviewed: 09/27/2015 Elsevier Interactive Patient Education  2018 [employer].

## 2017-11-13 NOTE — Progress Notes (Addendum)
Subjective:  By signing my name below, I, [name], attest that this documentation has been prepared under the direction and in the presence of [name], MD. Electronically Signed: [name], [place]. 11/13/2017 , 10:26 AM .  Patient was seen in Room 1 .   Patient ID: DELMA DRONE, female    DOB: 12-23-1965, 52 y.o.   MRN: [id number] Chief Complaint  Patient presents with  . Breast Pain    pt has been concerned about some discomfort and tenderness under each of her breast and the middle part of her chest. Pt states she has noticed she has had alot more gas    HPI Maria Sharp is a 52 y.o. female who presents to Primary Care at [facility] complaining of bilateral breast discomfort, as well as discomfort towards the middle of her chest with increased gas. She initially noticed right breast pain last week, and then radiated to her right flank. And then, noticed more left breast recently. She denies taking any OTC NSAID's or Gas-X.   She also reports being more flatulent, belching and bloating. She notes having some nausea and decreased appetite, becoming full easily after eating. She denies vomiting.  She takes Prilosec with some breakthrough heartburn. She's also felt food getting stuck in her throat since last Friday; last week, she did drink a lot more lemonade from [employer] and eating more tomato sauce. Prior to that, her heartburn was normal.   She's had constipation with hard bowels, and bowel movements would skip a day. She has a history of gastroparesis. When she did have a bowel movement, it would be small hard balls. And then, her bowels became soft and watery into normal stools. She last saw GI, Dr. Silverio Decamp, on 05/06/16 for gastroparesis. She was started on low dose Linzess 72 mcg qd, and stopped MiraLAX. But she had failed the Linzess 72 mcg qd as it was ineffective.   She also mentions having intermittent suprapubic pressure starting since Friday. She's noticed darker  urine with an odor. She denies increased urinary frequency or vaginal discharge. Prior period in April and then started again 7/1 (>3 wks ago).    Past Medical History:  Diagnosis Date  . Allergy   . Asthma   . Diabetes mellitus   . Fatty liver   . Gastritis   . Gastroparesis    study 11 14  nl abd [place]  . GERD (gastroesophageal reflux disease)   . High cholesterol   . History of varicella   . Hypertension   . IBS (irritable bowel syndrome)    Past Surgical History:  Procedure Laterality Date  . ANAL RECTAL MANOMETRY N/A 04/03/2015   Procedure: ANO RECTAL MANOMETRY;  Surgeon: Mauri Pole, MD;  Location: [facility];  Service: Endoscopy;  Laterality: N/A;  . CHOLECYSTECTOMY    . WISDOM TOOTH EXTRACTION     Prior to Admission medications   Medication Sig Start Date End Date Taking? Authorizing Provider  albuterol (PROVENTIL HFA;VENTOLIN HFA) 108 (90 Base) MCG/ACT inhaler INHALE 2 PUFFS Q 4 TO 6 H PRN 07/30/17   [provider]  b complex vitamins tablet Take 1 tablet by mouth daily. Chewable or dissolvable under tongue; contains 1074mg of B12 in ti.    [provider]  blood glucose meter kit and supplies KIT Dispense based on patient and insurance preference. Use up to four times daily as directed. (FOR ICD-9 250.00, 250.01). 09/26/16   SShawnee Knapp MD  cholecalciferol (VITAMIN D)  1000 units tablet Take 1,000 Units by mouth daily.    [provider]  EPINEPHrine (EPI-PEN) 0.3 mg/0.3 mL DEVI Inject 0.3 mg into the muscle once as needed. For severe allergic reaction    [provider]  EPINEPHrine 0.3 mg/0.3 mL IJ SOAJ injection INJECT INTRAMUSCULARLY AS DIRECTED 09/19/17   [provider]  HYDROcodone-acetaminophen (NORCO/VICODIN) 5-325 MG tablet Take 1 tablet by mouth every 6 (six) hours as needed for moderate pain. 10/20/17   Shawnee Knapp, MD  metformin (FORTAMET) 1000 MG (OSM) 24 hr tablet Take 2 tablets (2,000 mg total) by mouth daily  with breakfast. 10/20/17   Shawnee Knapp, MD  olmesartan (BENICAR) 20 MG tablet Take 1 tablet (20 mg total) by mouth daily. 10/20/17   Shawnee Knapp, MD  omeprazole (PRILOSEC) 40 MG capsule take 1 capsule by mouth once daily 05/29/17   Shawnee Knapp, MD  Pitavastatin Calcium (LIVALO) 1 MG TABS Take 1 tablet (1 mg total) by mouth daily. 06/23/17   Shawnee Knapp, MD  triamcinolone (NASACORT) 55 MCG/ACT AERO nasal inhaler Place 2 sprays into the nose daily. 07/31/17   Shawnee Knapp, MD  triamterene-hydrochlorothiazide (MAXZIDE-25) 37.5-25 MG tablet Take 1 tablet by mouth daily. 10/20/17   Shawnee Knapp, MD  Vitamin Mixture (VITAMIN E COMPLETE PO) Take by mouth.    [provider]   Allergies  Allergen Reactions  . Ace Inhibitors Anaphylaxis  . Shellfish Allergy Anaphylaxis and Other (See Comments)    Only shrimp  . Januvia [Sitagliptin] Other (See Comments)    Recurrent abdominal pain with elevated pancreas enzymes  . Latex Itching  . Lipitor [Atorvastatin]    Family History  Problem Relation Age of Onset  . Prostate cancer Father   . Diabetes Father   . Hypertension Father   . Diabetes Mother   . Heart disease Mother   . Arthritis Mother   . Hyperlipidemia Mother   . Hypertension Mother   . Parkinson's disease Mother   . Thyroid disease Mother   . Diabetes Paternal Grandmother   . Arthritis Paternal Grandmother   . Hyperlipidemia Paternal Grandmother   . Heart disease Paternal Grandmother   . Dementia Paternal Grandmother   . Hypertension Paternal Grandmother   . Hyperlipidemia Maternal Grandmother   . Hypertension Maternal Grandmother   . Hyperlipidemia Maternal Grandfather   . Hypertension Maternal Grandfather   . Hyperlipidemia Paternal Grandfather   . Hypertension Paternal Grandfather   . Diabetes Paternal Grandfather   . Cholelithiasis Paternal Grandfather   . Colon cancer Neg Hx   . Esophageal cancer Neg Hx   . Stomach cancer Neg Hx   . Rectal cancer Neg Hx    Social History    Socioeconomic History  . Marital status: Single    Spouse name: Not on file  . Number of children: Not on file  . Years of education: Not on file  . Highest education level: Not on file  Occupational History  . Not on file  Social Needs  . Financial resource strain: Not on file  . Food insecurity:    Worry: Not on file    Inability: Not on file  . Transportation needs:    Medical: Not on file    Non-medical: Not on file  Tobacco Use  . Smoking status: Never Smoker  . Smokeless tobacco: Never Used  Substance and Sexual Activity  . Alcohol use: No  . Drug use: No  . Sexual activity: Not  on file  Lifestyle  . Physical activity:    Days per week: Not on file    Minutes per session: Not on file  . Stress: Not on file  Relationships  . Social connections:    Talks on phone: Not on file    Gets together: Not on file    Attends religious service: Not on file    Active member of club or organization: Not on file    Attends meetings of clubs or organizations: Not on file    Relationship status: Not on file  Other Topics Concern  . Not on file  Social History Narrative   Usually # of hours of sleep per night: 6   3 of people living at your residence? 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   Irregular sleep 7:30 to 11 or 12 and then 4 to 6:30 before she goes to work   Has attended college.   Originally from [place]   Abnormal pap remote last 2012    Depression screen [facility] 2/9 11/13/2017 10/20/2017 06/23/2017 02/17/2017 11/23/2016  Decreased Interest 0 0 0 0 0  Down, Depressed, Hopeless 0 0 0 0 0  PHQ - 2 Score 0 0 0 0 0    Review of Systems  Constitutional: Positive for appetite change. Negative for chills, fatigue, fever and unexpected weight change.  Respiratory: Negative for cough.   Gastrointestinal: Positive for abdominal distention, constipation,  diarrhea and nausea. Negative for vomiting.  Genitourinary: Negative for vaginal bleeding.  Skin: Negative for rash and wound.  Neurological: Negative for dizziness, weakness and headaches.       Objective:   Physical Exam  Constitutional: She is oriented to person, place, and time. She appears well-developed and well-nourished. No distress.  HENT:  Head: Normocephalic and atraumatic.  Eyes: Pupils are equal, round, and reactive to light. EOM are normal.  Neck: Neck supple.  Cardiovascular: Tachycardia present.  Murmur heard.  Systolic (left upper sternal border, ejection) murmur is present with a grade of 2/6. Pulmonary/Chest: Effort normal and breath sounds normal. No stridor. No respiratory distress.  Abdominal: Bowel sounds are normal. There is tenderness (diffused) in the right upper quadrant. There is no CVA tenderness and negative Murphy's sign.  Diffused abdominal tenderness, worse in RUQ  Musculoskeletal: Normal range of motion.  Neurological: She is alert and oriented to person, place, and time.  Skin: Skin is warm and dry.  Psychiatric: She has a normal mood and affect. Her behavior is normal.  Nursing note and vitals reviewed.   BP 135/80 (BP Location: Right Arm, Patient Position: Sitting, Cuff Size: Large)   Pulse 82   Temp 98.4 F (36.9 C) (Oral)   Ht _0  (1.778 m)   Wt 280 lb 3.2 oz (127.1 kg)   LMP 10/20/2017   SpO2 98%   BMI 40.20 kg/m   Dg Abd Acute W/chest  Result Date: 11/13/2017 CLINICAL DATA:  epigastric and RUQ abd pain referred to Rt and Lt chest, h/o gastroparesis w/ alt consitpation/diarrhea, s/p cholecystectomy EXAM: DG ABDOMEN ACUTE W/ 1V CHEST COMPARISON:  02/11/2015 FINDINGS: There is no bowel dilation to suggest obstruction or significant adynamic ileus. No free air. There is increased stool in the colon and rectum, mild-to-moderate in degree. Stable changes from prior cholecystectomy. Soft tissues otherwise unremarkable. Heart, mediastinum  and hila  are unremarkable.  Lungs are clear. No acute skeletal abnormality. IMPRESSION: 1. No acute findings in the abdomen or pelvis. No evidence of bowel obstruction generalized adynamic ileus or free air. 2. Mild to moderate increase colonic stool consistent constipation. 3. No active cardiopulmonary disease. Electronically Signed   By: Lajean Manes M.D.   On: 11/13/2017 11:01   Results for orders placed or performed in visit on 11/13/17  POCT urinalysis dipstick  Result Value Ref Range   Color, UA yellow yellow   Clarity, UA clear clear   Glucose, UA negative negative mg/dL   Bilirubin, UA negative negative   Ketones, POC UA negative negative mg/dL   Spec Grav, UA 1.025 1.010 - 1.025   Blood, UA trace-intact (A) negative   pH, UA 6.0 5.0 - 8.0   Protein Ur, POC negative negative mg/dL   Urobilinogen, UA 0.2 0.2 or 1.0 E.U./dL   Nitrite, UA Negative Negative   Leukocytes, UA Negative Negative  POCT Microscopic Urinalysis ([facility])  Result Value Ref Range   WBC,UR,HPF,POC None None WBC/hpf   RBC,UR,HPF,POC None None RBC/hpf   Bacteria None None, Too numerous to count   Mucus Absent Absent   Epithelial Cells, UR Per Microscopy None None, Too numerous to count cells/hpf  POCT CBC  Result Value Ref Range   WBC 7.4 4.6 - 10.2 K/uL   Lymph, poc 2.5 0.6 - 3.4   POC LYMPH PERCENT 33.6 10 - 50 %L   MID (cbc) 0.4 0 - 0.9   POC MID % 5.5 0 - 12 %M   POC Granulocyte 4.5 2 - 6.9   Granulocyte percent 60.9 37 - 80 %G   RBC 4.66 4.04 - 5.48 M/uL   Hemoglobin 11.7 (A) 12.2 - 16.2 g/dL   HCT, POC 37.7 37.7 - 47.9 %   MCV 80.9 80 - 97 fL   MCH, POC 25.1 (A) 27 - 31.2 pg   MCHC 31.0 (A) 31.8 - 35.4 g/dL   RDW, POC 16.8 %   Platelet Count, POC 364 142 - 424 K/uL   MPV 6.4 0 - 99.8 fL   EKG: NSR, no acute ischemic changes noted. No significant change noted when compared to prior EKG done 01/29/13.   I have personally reviewed the EKG tracing and agree with the computer interpretation. Sinus   Rhythm  WITHIN NORMAL LIMITS     Assessment & Plan:   1. Pelvic pain in female - did not do pelvic exam today as pt has annual appt w/ gyn (Dr. Stann Mainland) sched for very soon and sev GI etiologies of pain discovered. Pt is having metromenorrhagia so could be perimenopausal - cons checking fsh/lh - but will defer to her gyn since already has sced appt  2. Abdominal pain, epigastric - some improvement with GI cocktail so suspect sxs are from uncontrolled GERD and chronic constipation. Stop omeprazole - restart dexilant which she has done well on in past. Referred back to [facility] - Dr. Silverio Decamp  3. Abdominal pain, right upper quadrant   4. Atypical chest pain   5. Vitamin D deficiency - has been taking otc 4000u/d x 3-6 mos so will want to recheck level in the next 3 mos or so.  6. Type 2 diabetes mellitus with peripheral circulatory disorder ([place])   7. Essential hypertension   8. Cardiomegaly   9. Constipation, unspecified constipation type - max citrate tonight than start bid amitiza.  If can't get amitiza due to cost, start mag 427m qhs  and fiber supplement daily prior to meal.  10. Gastroesophageal reflux disease, esophagitis presence not specified   11. Esophageal dysphagia    Just started livalo (pitavastatin) 1 mg  Orders Placed This Encounter  Procedures  . DG Abd Acute W/Chest    Standing Status:   Future    Number of Occurrences:   1    Standing Expiration Date:   11/13/2018    Order Specific Question:   Reason for exam:    Answer:   epigastric and RUQ abd pain referred to Rt and Lt chest, h/o gastroparesis w/ alt consitpation/diarrhea, s/p cholecystectomy    Order Specific Question:   Is the patient pregnant?    Answer:   No    Order Specific Question:   Preferred imaging location?    Answer:   External  . Comprehensive metabolic panel  . Lipase  . Ambulatory referral to Gastroenterology    Referral Priority:   Routine    Referral Type:   Consultation    Referral Reason:    Specialty Services Required    Referred to Provider:   Mauri Pole, MD    Number of Visits Requested:   1  . POCT urinalysis dipstick  . POCT Microscopic Urinalysis ([facility])  . POCT CBC  . POC Hemoccult Bld/Stl (3-Cd Home Screen)    Standing Status:   Future    Standing Expiration Date:   11/14/2018  . EKG 12-Lead    Meds ordered this encounter  Medications  . gi cocktail (Maalox,Lidocaine,Donnatal)  . cholecalciferol (VITAMIN D) 1000 units tablet    Sig: Take 4 tablets (4,000 Units total) by mouth daily.    Dispense:  120 tablet    Refill:  2  . dexlansoprazole (DEXILANT) 60 MG capsule    Sig: Take 1 capsule (60 mg total) by mouth daily.    Dispense:  90 capsule    Refill:  1    D/c omeprazole - failed it  . lubiprostone (AMITIZA) 24 MCG capsule    Sig: Take 1 capsule (24 mcg total) by mouth 2 (two) times daily with a meal.    Dispense:  180 capsule    Refill:  0   I personally performed the services described in this documentation, which was scribed in my presence. The recorded information has been reviewed and considered, and addended by me as needed.   Over 40 min spent in face-to-face evaluation of and consultation with patient and coordination of care.  Over 50% of this time was spent counseling this patient regarding potential etiologies of abd pain and treatments.  [name], M.D.  Primary Care at [facility] [address] [place],  [address] [phone] phone [phone] fax  01/18/18 4:23 AM

## 2017-11-14 LAB — COMPREHENSIVE METABOLIC PANEL
ALT: 11 IU/L (ref 0–32)
AST: 10 IU/L (ref 0–40)
Albumin/Globulin Ratio: 1.5 (ref 1.2–2.2)
Albumin: 4.4 g/dL (ref 3.5–5.5)
Alkaline Phosphatase: 68 IU/L (ref 39–117)
BUN/Creatinine Ratio: 18 (ref 9–23)
BUN: 13 mg/dL (ref 6–24)
Bilirubin Total: 0.2 mg/dL (ref 0.0–1.2)
CO2: 22 mmol/L (ref 20–29)
Calcium: 9.9 mg/dL (ref 8.7–10.2)
Chloride: 96 mmol/L (ref 96–106)
Creatinine, Ser: 0.73 mg/dL (ref 0.57–1.00)
GFR calc Af Amer: 110 mL/min/{1.73_m2} (ref >59)
GFR calc non Af Amer: 96 mL/min/{1.73_m2} (ref >59)
Globulin, Total: 3 g/dL (ref 1.5–4.5)
Glucose: 122 mg/dL — ABNORMAL HIGH (ref 65–99)
Potassium: 3.8 mmol/L (ref 3.5–5.2)
Sodium: 135 mmol/L (ref 134–144)
Total Protein: 7.4 g/dL (ref 6.0–8.5)

## 2017-11-14 LAB — LIPASE: Lipase: 25 U/L (ref 14–72)

## 2017-11-18 ENCOUNTER — Encounter (INDEPENDENT_AMBULATORY_CARE_PROVIDER_SITE_OTHER): Payer: 59 | Admitting: Vascular Surgery

## 2017-11-18 DIAGNOSIS — I868 Varicose veins of other specified sites: Secondary | ICD-10-CM

## 2017-12-03 ENCOUNTER — Telehealth: Payer: Self-pay | Admitting: Family Medicine

## 2017-12-03 NOTE — Telephone Encounter (Signed)
Pt was denied Amitiza 24 MCG Capsules. Placed denial letter in providers box.

## 2017-12-19 ENCOUNTER — Telehealth: Payer: Self-pay | Admitting: Family Medicine

## 2017-12-19 NOTE — Telephone Encounter (Signed)
Dr.Shaw, This pt was denied their Amitiza 24MCG OR CAPS.  The cause of the denial was " your amitiza criteria covers this drug when you have any of these conditions: *Chronic idiopathic constipation in an adult patient *Opioid-induced constipation in an adult patient with chronic non-cancer pain *irritable bowel syndrome with constipation in a biological female or a person that self identifies as a female   I have placed the form in your nurse box.  Please advise.  Lavella Lemons

## 2017-12-30 ENCOUNTER — Ambulatory Visit: Payer: 59 | Admitting: Physician Assistant

## 2017-12-30 ENCOUNTER — Encounter: Payer: Self-pay | Admitting: Physician Assistant

## 2017-12-30 VITALS — BP 130/82 | HR 84 | Ht 70.0 in | Wt 277.0 lb

## 2017-12-30 DIAGNOSIS — K581 Irritable bowel syndrome with constipation: Secondary | ICD-10-CM | POA: Diagnosis not present

## 2017-12-30 DIAGNOSIS — K3184 Gastroparesis: Secondary | ICD-10-CM | POA: Diagnosis not present

## 2017-12-30 MED ORDER — LINACLOTIDE 72 MCG PO CAPS: 72.0000 ug | ORAL_CAPSULE | Freq: Every day | ORAL | 1 refills | Status: DC

## 2017-12-30 MED ORDER — LINACLOTIDE 72 MCG PO CAPS: 72.0000 ug | ORAL_CAPSULE | Freq: Every day | ORAL | 2 refills | Status: DC

## 2017-12-30 NOTE — Patient Instructions (Signed)
We have sent the following medications to your pharmacy for you to pick up at your convenience: Linzess 7 83mcg daily

## 2017-12-30 NOTE — Progress Notes (Signed)
Chief Complaint: Gastroparesis, IBS-C  HPI:    Maria Sharp is a 52 year old African-American female with a past medical history of gastroparesis and IBS-C, follows with Dr. Silverio Decamp and presents to clinic today for complaint of constipation, bloating and nausea.      03/16/2008 last colonoscopy, normal.    05/06/2016 office visit with Dr. Silverio Decamp to discuss nausea, heartburn, diarrhea and constipation.  Her history of gastroparesis was discussed.  At that time she was started on low-dose Linzess 72 mcg daily.  4-hour gastric emptying study was ordered.  She never had this done.    Today, patient explains that she has a history of gastroparesis and IBS-C.  Tells me that she does not recall being on Linzess 72 mcg or does not believe she took it long enough for it to do anything.  Most recently has used a bottle of mag citrate in order to void her bowels.  Tells me that she only has one "good big bowel movement on Monday" and then the rest of the week she only has small stools which results in increased abdominal bloating and discomfort, this typically radiates up to her right upper quadrant.  Associated symptoms include some nausea after eating.    Continues her Omeprazole 40 mg daily and denies any heartburn or reflux.    Denies fever, chills, weight loss, anorexia, vomiting or symptoms that awaken her from sleep.  Past Medical History:  Diagnosis Date  . Allergy   . Asthma   . Diabetes mellitus   . Fatty liver   . Gastritis   . Gastroparesis    study 11 14  nl abd [place]  . GERD (gastroesophageal reflux disease)   . High cholesterol   . History of varicella   . Hypertension   . IBS (irritable bowel syndrome)     Past Surgical History:  Procedure Laterality Date  . ANAL RECTAL MANOMETRY N/A 04/03/2015   Procedure: ANO RECTAL MANOMETRY;  Surgeon: Mauri Pole, MD;  Location: [facility];  Service: Endoscopy;  Laterality: N/A;  . CHOLECYSTECTOMY    . WISDOM TOOTH EXTRACTION       Current Outpatient Medications  Medication Sig Dispense Refill  . albuterol (PROVENTIL HFA;VENTOLIN HFA) 108 (90 Base) MCG/ACT inhaler INHALE 2 PUFFS Q 4 TO 6 H PRN  0  . blood glucose meter kit and supplies KIT Dispense based on patient and insurance preference. Use up to four times daily as directed. (FOR ICD-9 250.00, 250.01). 1 each 0  . cholecalciferol (VITAMIN D) 1000 units tablet Take 4 tablets (4,000 Units total) by mouth daily. 120 tablet 2  . EPINEPHrine (EPI-PEN) 0.3 mg/0.3 mL DEVI Inject 0.3 mg into the muscle once as needed. For severe allergic reaction    . metformin (FORTAMET) 1000 MG (OSM) 24 hr tablet Take 2 tablets (2,000 mg total) by mouth daily with breakfast. 180 tablet 1  . olmesartan (BENICAR) 20 MG tablet Take 1 tablet (20 mg total) by mouth daily. 90 tablet 3  . omeprazole (PRILOSEC) 40 MG capsule take 1 capsule by mouth once daily 90 capsule 3  . Pitavastatin Calcium (LIVALO) 1 MG TABS Take 1 tablet (1 mg total) by mouth daily. 90 tablet 1  . triamcinolone (NASACORT) 55 MCG/ACT AERO nasal inhaler Place 2 sprays into the nose daily. 1 Inhaler 12  . triamterene-hydrochlorothiazide (MAXZIDE-25) 37.5-25 MG tablet Take 1 tablet by mouth daily. 90 tablet 3  . vitamin B-12 (CYANOCOBALAMIN) 1000 MCG tablet Take 1,000 mcg by mouth daily.    Marland Kitchen  vitamin E 400 UNIT capsule Take 400 Units by mouth daily.    . Vitamin Mixture (VITAMIN E COMPLETE PO) Take by mouth.    . linaclotide (LINZESS) 72 MCG capsule Take 1 capsule (72 mcg total) by mouth daily before breakfast. 30 capsule 1   No current facility-administered medications for this visit.     Allergies as of 12/30/2017 - Review Complete 12/30/2017  Allergen Reaction Noted  . Ace inhibitors Anaphylaxis 03/03/2008  . Shellfish allergy Anaphylaxis and Other (See Comments) 05/25/2011  . Januvia [sitagliptin] Other (See Comments) 10/05/2012  . Latex Itching   . Lipitor [atorvastatin]  02/10/2013    Family History  Problem  Relation Age of Onset  . Prostate cancer Father   . Diabetes Father   . Hypertension Father   . Diabetes Mother   . Heart disease Mother   . Arthritis Mother   . Hyperlipidemia Mother   . Hypertension Mother   . Parkinson's disease Mother   . Thyroid disease Mother   . Diabetes Paternal Grandmother   . Arthritis Paternal Grandmother   . Hyperlipidemia Paternal Grandmother   . Heart disease Paternal Grandmother   . Dementia Paternal Grandmother   . Hypertension Paternal Grandmother   . Hyperlipidemia Maternal Grandmother   . Hypertension Maternal Grandmother   . Hyperlipidemia Maternal Grandfather   . Hypertension Maternal Grandfather   . Hyperlipidemia Paternal Grandfather   . Hypertension Paternal Grandfather   . Diabetes Paternal Grandfather   . Cholelithiasis Paternal Grandfather   . Colon cancer Neg Hx   . Esophageal cancer Neg Hx   . Stomach cancer Neg Hx   . Rectal cancer Neg Hx     Social History   Socioeconomic History  . Marital status: Single    Spouse name: Not on file  . Number of children: 0  . Years of education: Not on file  . Highest education level: Not on file  Occupational History  . Occupation: Marine scientist  Social Needs  . Financial resource strain: Not on file  . Food insecurity:    Worry: Not on file    Inability: Not on file  . Transportation needs:    Medical: Not on file    Non-medical: Not on file  Tobacco Use  . Smoking status: Never Smoker  . Smokeless tobacco: Never Used  Substance and Sexual Activity  . Alcohol use: No  . Drug use: No  . Sexual activity: Not on file  Lifestyle  . Physical activity:    Days per week: Not on file    Minutes per session: Not on file  . Stress: Not on file  Relationships  . Social connections:    Talks on phone: Not on file    Gets together: Not on file    Attends religious service: Not on file    Active member of club or organization: Not on file    Attends meetings of clubs or  organizations: Not on file    Relationship status: Not on file  . Intimate partner violence:    Fear of current or ex partner: Not on file    Emotionally abused: Not on file    Physically abused: Not on file    Forced sexual activity: Not on file  Other Topics Concern  . Not on file  Social History Narrative   Usually # of hours of sleep per night: 6   3 of people living at your residence? 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   Irregular sleep 7:30 to 11 or 12 and then 4 to 6:30 before she goes to work   Has attended college.   Originally from [place].   G0P0   Abnormal pap remote last 2012     Review of Systems:    Constitutional: No weight loss, fever or chills Cardiovascular: No chest pain Respiratory: No SOB Gastrointestinal: See HPI and otherwise negative   Physical Exam:  Vital signs: BP 130/82 (Cuff Size: Large)   Pulse 84   Ht _0  (1.778 m)   Wt 277 lb (125.6 kg)   BMI 39.75 kg/m   Constitutional:   Pleasant overweight AA female appears to be in NAD, Well developed, Well nourished, alert and cooperative Head:  Normocephalic and atraumatic. Eyes:   PEERL, EOMI. No icterus. Conjunctiva pink. Ears:  Normal auditory acuity. Neck:  Supple Throat: Oral cavity and pharynx without inflammation, swelling or lesion.  Respiratory: Respirations even and unlabored. Lungs clear to auscultation bilaterally.   No wheezes, crackles, or rhonchi.  Cardiovascular: Normal S1, S2. No MRG. Regular rate and rhythm. No peripheral edema, cyanosis or pallor.  Gastrointestinal:  Soft, nondistended,moderate RUQ ttp, No rebound or guarding. Normal bowel sounds. No appreciable masses or hepatomegaly. Rectal:  Not performed.  Msk:  Symmetrical without gross deformities. Without edema, no deformity or joint abnormality.  Neurologic:  Alert and  oriented x4;  grossly normal  neurologically.  Skin:   Dry and intact without significant lesions or rashes. Psychiatric: Demonstrates good judgement and reason without abnormal affect or behaviors.  RELEVANT LABS AND IMAGING: CBC    Component Value Date/Time   WBC 7.4 11/13/2017 1051   WBC 8.4 10/28/2016 1038   RBC 4.66 11/13/2017 1051   RBC 4.33 10/28/2016 1038   HGB 11.7 (A) 11/13/2017 1051   HGB CANCELED 11/16/2016 1107   HCT 37.7 11/13/2017 1051   HCT CANCELED 11/16/2016 1107   PLT CANCELED 11/16/2016 1107   MCV 80.9 11/13/2017 1051   MCV 81 11/16/2016 1041   MCH 25.1 (A) 11/13/2017 1051   MCH 27.0 10/28/2016 1038   MCHC 31.0 (A) 11/13/2017 1051   MCHC 32.6 10/28/2016 1038   RDW 16.1 (H) 11/16/2016 1041   LYMPHSABS 2.4 10/28/2016 1038   MONOABS 0.7 10/28/2016 1038   EOSABS 0.1 10/28/2016 1038   BASOSABS 0.1 10/28/2016 1038    CMP     Component Value Date/Time   NA 135 11/13/2017 1157   K 3.8 11/13/2017 1157   CL 96 11/13/2017 1157   CO2 22 11/13/2017 1157   GLUCOSE 122 (H) 11/13/2017 1157   GLUCOSE 138 (H) 10/28/2016 1038   BUN 13 11/13/2017 1157   CREATININE 0.73 11/13/2017 1157   CREATININE 0.69 10/03/2015 0937   CALCIUM 9.9 11/13/2017 1157   PROT 7.4 11/13/2017 1157   ALBUMIN 4.4 11/13/2017 1157   AST 10 11/13/2017 1157   ALT 11 11/13/2017 1157   ALKPHOS 68 11/13/2017 1157   BILITOT 0.2 11/13/2017 1157   GFRNONAA 96 11/13/2017 1157   GFRAA 110 11/13/2017 1157    Assessment: 1.  IBS-C: Patient has never been on medication for this in the past that she can recall 2.  Gastroparesis: History of delayed emptying on 2-hour gastric emptying study, patient does have some nausea and bloating 3.  Screening for colorectal cancer: Last in November 2009 with recommendations for  repeat in 10 years, will be due in 2 months  Plan: 1.  Prescribed Linzess 72 mcg daily.  #30 with 5 refills 2.  Instructed the patient to call her clinic in 2 weeks and let us know how she is doing, if this is not  strong enough we can increase it. 3.  Continue Omeprazole 40 mg daily 4.  Briefly discussed Reglan and other medication options for gastroparesis.  Patient declines all of these because she is afraid of the side effects. 5.  It was discussed the patient should have a 4-hour gastric emptying study by Dr. Silverio Decamp in the past.  Patient is following with Dr. Silverio Decamp in 1 month, will let them discuss further. 6.  Patient is following Dr. Silverio Decamp in 1 month, she will need to be arranged for her screening colonoscopy at that time.  Ellouise Newer, PA-C [facility] 12/30/2017, 10:14 AM  Cc: Shawnee Knapp, MD

## 2018-01-05 NOTE — Progress Notes (Signed)
Reviewed and agree with documentation and assessment and plan. K. Veena Railey Glad , MD   

## 2018-01-06 ENCOUNTER — Ambulatory Visit (INDEPENDENT_AMBULATORY_CARE_PROVIDER_SITE_OTHER): Payer: 59 | Admitting: *Deleted

## 2018-01-06 DIAGNOSIS — I83893 Varicose veins of bilateral lower extremities with other complications: Secondary | ICD-10-CM | POA: Diagnosis not present

## 2018-01-06 NOTE — Progress Notes (Addendum)
           X=.3% Sotradecol administered with a 27g butterfly.  Patient received a total of 18cc.  Treated as much as the pt would allow. Easy access. Tol pretty well. She has one more ins covered tx in Oct.  Photos: Yes.    Compression stockings applied: Yes.

## 2018-01-09 ENCOUNTER — Telehealth: Payer: Self-pay | Admitting: Family Medicine

## 2018-01-09 NOTE — Telephone Encounter (Signed)
Will need additional information

## 2018-01-09 NOTE — Telephone Encounter (Signed)
Started PA for :Livalo 1 MG Tablets  KEY: [id number]

## 2018-01-09 NOTE — Telephone Encounter (Signed)
Medication has been denied.   Routed to [place].

## 2018-01-12 LAB — HM DIABETES EYE EXAM

## 2018-01-12 NOTE — Telephone Encounter (Signed)
Received denial letter for the Livalo 1 MG tablets.   Placed in Dr. Raul Del box and routed to clinical and Almyra Free.

## 2018-01-13 ENCOUNTER — Encounter: Payer: Self-pay | Admitting: Physician Assistant

## 2018-01-13 ENCOUNTER — Ambulatory Visit (INDEPENDENT_AMBULATORY_CARE_PROVIDER_SITE_OTHER): Payer: 59 | Admitting: Physician Assistant

## 2018-01-13 ENCOUNTER — Ambulatory Visit: Payer: 59

## 2018-01-13 VITALS — BP 124/84 | HR 90 | Temp 98.3°F | Resp 17 | Ht 70.0 in | Wt 272.0 lb

## 2018-01-13 DIAGNOSIS — R002 Palpitations: Secondary | ICD-10-CM

## 2018-01-13 DIAGNOSIS — F4323 Adjustment disorder with mixed anxiety and depressed mood: Secondary | ICD-10-CM | POA: Diagnosis not present

## 2018-01-13 NOTE — Patient Instructions (Signed)
° ° ° °  If you have lab work done today you will be contacted with your lab results within the next 2 weeks.  If you have not heard from us then please contact us. The fastest way to get your results is to register for My Chart. ° ° °IF you received an x-ray today, you will receive an invoice from [facility]. Please contact [facility] at [phone] with questions or concerns regarding your invoice.  ° °IF you received labwork today, you will receive an invoice from [facility]. Please contact [facility] at [phone] with questions or concerns regarding your invoice.  ° °Our billing staff will not be able to assist you with questions regarding bills from these companies. ° °You will be contacted with the lab results as soon as they are available. The fastest way to get your results is to activate your My Chart account. Instructions are located on the last page of this paperwork. If you have not heard from us regarding the results in 2 weeks, please contact this office. °  ° ° ° °

## 2018-01-13 NOTE — Progress Notes (Signed)
 Maria Sharp  MRN: [id number] DOB: 02/17/1966  PCP: Shaw, Eva N, MD  Subjective:  Pt is a pleasant 52 year old female who presents to clinic for heart palpitations. She is a pt of Dr. Shaw's. Last OV two months ago with atypical chest pain, EKG not concerning.   Episodes of heart palpitations happened 5 days ago. "felt like my heart was doing cheetah flips". Episode lasted about 5 seconds. She had three back to back episodes.  Constipated and flare of gastroparesis at the time  She took a Prilosec and ASA. Prilosec helped.  She is under a lot of stress recently regarding her mother who was told her cancer may be back - they are awaiting lab results which should be back next week.  Last episode of heart palpitations was in 2017.  Cards Dr. Hochrein -- has appt in Nov.  Denies lightheadedness, dizziness, chronic headache, double vision, chest pain, neck pain, left shoulder/arm pain, shortness of breath, heart racing, palpitations, nausea, vomiting, abdominal pain, hematuria, lower leg swelling.  Pt  has a past medical history of Allergy, Asthma, Diabetes mellitus, Fatty liver, Gastritis, Gastroparesis, GERD (gastroesophageal reflux disease), High cholesterol, History of varicella, Hypertension, and IBS (irritable bowel syndrome).  Review of Systems  Constitutional: Negative for chills, diaphoresis, fatigue and fever.  Respiratory: Negative for chest tightness and shortness of breath.   Cardiovascular: Positive for palpitations. Negative for chest pain and leg swelling.  Gastrointestinal: Positive for abdominal pain and constipation. Negative for diarrhea, nausea and vomiting.  Musculoskeletal: Negative for arthralgias and neck pain.  Psychiatric/Behavioral: Positive for dysphoric mood. The patient is not nervous/anxious.     Patient Active Problem List   Diagnosis Date Noted  . Migraines 09/02/2017  . DJD (degenerative joint disease) 09/02/2017  . Vertigo 12/05/2016  . Bilateral  temporomandibular joint pain 12/05/2016  . Constipation   . Asthma, chronic 09/28/2014  . Gastroparesis   . Varicose veins 09/14/2013  . Neck pain on left side 06/14/2013  . Current non-adherence to medical treatment 06/14/2013  . Vitamin D deficiency 10/05/2012  . Leg cramps 10/05/2012  . Family history of diabetes mellitus 10/05/2012  . Type 2 diabetes mellitus, uncontrolled (HCC) 05/28/2012  . 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 ABDOMINAL PAIN, UNSPECIFIED SITE 05/18/2010  . CHEST PAIN, INTERMITTENT 05/12/2009  . Obesity 04/05/2009  . CARDIOMEGALY 04/05/2009  . PALPITATIONS 04/05/2009  . Type 2 diabetes mellitus with peripheral circulatory disorder (HCC) 03/15/2009  . BACK PAIN 03/09/2008  . Hyperlipidemia LDL goal <100 03/03/2008  . Essential hypertension 03/03/2008  . GERD 03/03/2008  . IRRITABLE BOWEL SYNDROME 03/03/2008  . RECTAL BLEEDING 03/03/2008    Current Outpatient Medications on File Prior to Visit  Medication Sig Dispense Refill  . albuterol (PROVENTIL HFA;VENTOLIN HFA) 108 (90 Base) MCG/ACT inhaler INHALE 2 PUFFS Q 4 TO 6 H PRN  0  . blood glucose meter kit and supplies KIT Dispense based on patient and insurance preference. Use up to four times daily as directed. (FOR ICD-9 250.00, 250.01). 1 each 0  . cholecalciferol (VITAMIN D) 1000 units tablet Take 4 tablets (4,000 Units total) by mouth daily. 120 tablet 2  . EPINEPHrine (EPI-PEN) 0.3 mg/0.3 mL DEVI Inject 0.3 mg into the muscle once as needed. For severe allergic reaction    . linaclotide (LINZESS) 72 MCG capsule Take 1 capsule (72 mcg total) by mouth daily before breakfast. 90 capsule 2  . metformin (FORTAMET) 1000 MG (OSM) 24 hr tablet Take 2 tablets (2,000 mg   total) by mouth daily with breakfast. 180 tablet 1  . olmesartan (BENICAR) 20 MG tablet Take 1 tablet (20 mg total) by mouth daily. 90 tablet 3  . omeprazole (PRILOSEC) 40 MG capsule take 1 capsule by mouth once daily 90 capsule 3  . triamcinolone  (NASACORT) 55 MCG/ACT AERO nasal inhaler Place 2 sprays into the nose daily. 1 Inhaler 12  . triamterene-hydrochlorothiazide (MAXZIDE-25) 37.5-25 MG tablet Take 1 tablet by mouth daily. 90 tablet 3  . vitamin B-12 (CYANOCOBALAMIN) 1000 MCG tablet Take 1,000 mcg by mouth daily.    . vitamin E 400 UNIT capsule Take 400 Units by mouth daily.    . Vitamin Mixture (VITAMIN E COMPLETE PO) Take by mouth.    . Pitavastatin Calcium (LIVALO) 1 MG TABS Take 1 tablet (1 mg total) by mouth daily. (Patient not taking: Reported on 01/13/2018) 90 tablet 1   No current facility-administered medications on file prior to visit.     Allergies  Allergen Reactions  . Ace Inhibitors Anaphylaxis  . Shellfish Allergy Anaphylaxis and Other (See Comments)    Only shrimp  . Januvia [Sitagliptin] Other (See Comments)    Recurrent abdominal pain with elevated pancreas enzymes  . Latex Itching  . Lipitor [Atorvastatin]      Objective:  BP 124/84   Pulse 90   Temp 98.3 F (36.8 C) (Oral)   Resp 17   Ht 5' 10" (1.778 m)   Wt 272 lb (123.4 kg)   LMP 10/20/2017 (Approximate)   SpO2 98%   BMI 39.03 kg/m   Physical Exam  Constitutional: She is oriented to person, place, and time. No distress.  Cardiovascular: Normal rate, regular rhythm and normal heart sounds.  Neurological: She is alert and oriented to person, place, and time.  Skin: Skin is warm and dry.  Psychiatric: She has a normal mood and affect. Her speech is normal and behavior is normal. Judgment and thought content normal. Cognition and memory are normal.  tearful  Vitals reviewed.  EKG - no S/T elevations. RRR. Unchanged from previous tracings.  Assessment and Plan :  1. Palpitations - Pt presents with 3 episodes of heart palpitations last week, which happened along with gastroparesis and constipation flare. She is asymptomatic today. EKG is not concerning for S/T changes. Vitals are stable. Low suspicion for cardiac event. She has appt with  cardiologist Dr. Percival Spanish in Nov. RTC if symptoms return.  - EKG 12-Lead  2. Adjustment disorder with mixed anxiety and depressed mood - Pt is currently under stress and is occasionally tearful during exam when speaking about her mother's cancer possibly returning. She declines therapy. Advised stress relieving techniques.    Mercer Pod, PA-C  [facility] 01/13/2018 10:20 AM  Please note: Portions of this report may have been transcribed using dragon voice recognition software. Every effort was made to ensure accuracy; however, inadvertent computerized transcription errors may be present.

## 2018-01-13 NOTE — Telephone Encounter (Signed)
Pharm is calling checking on PA for livalo

## 2018-01-14 NOTE — Telephone Encounter (Signed)
See note below livalo denied  Letter sent to Doctor

## 2018-01-15 ENCOUNTER — Ambulatory Visit: Payer: 59

## 2018-01-18 NOTE — Telephone Encounter (Signed)
Duplicate message from 05/31/11 which has more detail on denial - see that phone note

## 2018-01-18 NOTE — Telephone Encounter (Signed)
New that was potential - had already prev instructed pt in her AVS to start on qhs magnesium supplement and daily fiber supplement and referred to Gi if ins would not authorize Amitiza

## 2018-01-20 ENCOUNTER — Ambulatory Visit: Payer: 59 | Admitting: Sports Medicine

## 2018-01-20 ENCOUNTER — Encounter: Payer: Self-pay | Admitting: Sports Medicine

## 2018-01-20 DIAGNOSIS — M79609 Pain in unspecified limb: Secondary | ICD-10-CM

## 2018-01-20 DIAGNOSIS — I739 Peripheral vascular disease, unspecified: Secondary | ICD-10-CM

## 2018-01-20 DIAGNOSIS — E119 Type 2 diabetes mellitus without complications: Secondary | ICD-10-CM | POA: Diagnosis not present

## 2018-01-20 DIAGNOSIS — B351 Tinea unguium: Secondary | ICD-10-CM | POA: Diagnosis not present

## 2018-01-20 NOTE — Progress Notes (Signed)
Subjective: Maria Sharp is a 52 y.o. female patient with history of diabetes who presents to office today complaining of long,mildly painful nails  while ambulating in shoes; unable to trim. Patient states that the glucose reading this morning was not recorded and does not know last A1c however does think that he was around 6.8. Patient denies any new changes in medication or new problems.  No other issues noted.  Patient Active Problem List   Diagnosis Date Noted  . Migraines 09/02/2017  . DJD (degenerative joint disease) 09/02/2017  . Vertigo 12/05/2016  . Bilateral temporomandibular joint pain 12/05/2016  . Constipation   . Asthma, chronic 09/28/2014  . Gastroparesis   . Varicose veins 09/14/2013  . Neck pain on left side 06/14/2013  . Current non-adherence to medical treatment 06/14/2013  . Vitamin D deficiency 10/05/2012  . Leg cramps 10/05/2012  . Family history of diabetes mellitus 10/05/2012  . Type 2 diabetes mellitus, uncontrolled ([place]) 05/28/2012  . 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 ABDOMINAL PAIN, UNSPECIFIED SITE 05/18/2010  . CHEST PAIN, INTERMITTENT 05/12/2009  . Obesity 04/05/2009  . CARDIOMEGALY 04/05/2009  . PALPITATIONS 04/05/2009  . Type 2 diabetes mellitus with peripheral circulatory disorder ([place]) 03/15/2009  . BACK PAIN 03/09/2008  . Hyperlipidemia LDL goal <100 03/03/2008  . Essential hypertension 03/03/2008  . GERD 03/03/2008  . IRRITABLE BOWEL SYNDROME 03/03/2008  . RECTAL BLEEDING 03/03/2008   Current Outpatient Medications on File Prior to Visit  Medication Sig Dispense Refill  . albuterol (PROVENTIL HFA;VENTOLIN HFA) 108 (90 Base) MCG/ACT inhaler INHALE 2 PUFFS Q 4 TO 6 H PRN  0  . blood glucose meter kit and supplies KIT Dispense based on patient and insurance preference. Use up to four times daily as directed. (FOR ICD-9 250.00, 250.01). 1 each 0  . cholecalciferol (VITAMIN D) 1000 units tablet Take 4 tablets (4,000 Units total) by mouth  daily. 120 tablet 2  . EPINEPHrine (EPI-PEN) 0.3 mg/0.3 mL DEVI Inject 0.3 mg into the muscle once as needed. For severe allergic reaction    . linaclotide (LINZESS) 72 MCG capsule Take 1 capsule (72 mcg total) by mouth daily before breakfast. 90 capsule 2  . metformin (FORTAMET) 1000 MG (OSM) 24 hr tablet Take 2 tablets (2,000 mg total) by mouth daily with breakfast. 180 tablet 1  . olmesartan (BENICAR) 20 MG tablet Take 1 tablet (20 mg total) by mouth daily. 90 tablet 3  . omeprazole (PRILOSEC) 40 MG capsule take 1 capsule by mouth once daily 90 capsule 3  . Pitavastatin Calcium (LIVALO) 1 MG TABS Take 1 tablet (1 mg total) by mouth daily. 90 tablet 1  . triamcinolone (NASACORT) 55 MCG/ACT AERO nasal inhaler Place 2 sprays into the nose daily. 1 Inhaler 12  . triamterene-hydrochlorothiazide (MAXZIDE-25) 37.5-25 MG tablet Take 1 tablet by mouth daily. 90 tablet 3  . vitamin B-12 (CYANOCOBALAMIN) 1000 MCG tablet Take 1,000 mcg by mouth daily.    . vitamin E 400 UNIT capsule Take 400 Units by mouth daily.    . Vitamin Mixture (VITAMIN E COMPLETE PO) Take by mouth.     No current facility-administered medications on file prior to visit.    Allergies  Allergen Reactions  . Ace Inhibitors Anaphylaxis  . Shellfish Allergy Anaphylaxis and Other (See Comments)    Only shrimp  . Januvia [Sitagliptin] Other (See Comments)    Recurrent abdominal pain with elevated pancreas enzymes  . Latex Itching  . Lipitor [Atorvastatin]     Recent Results (  from the past 2160 hour(s))  POCT urinalysis dipstick     Status: Abnormal   Collection Time: 11/13/17 10:25 AM  Result Value Ref Range   Color, UA yellow yellow   Clarity, UA clear clear   Glucose, UA negative negative mg/dL   Bilirubin, UA negative negative   Ketones, POC UA negative negative mg/dL   Spec Grav, UA 1.025 1.010 - 1.025   Blood, UA trace-intact (A) negative   pH, UA 6.0 5.0 - 8.0   Protein Ur, POC negative negative mg/dL    Urobilinogen, UA 0.2 0.2 or 1.0 E.U./dL   Nitrite, UA Negative Negative   Leukocytes, UA Negative Negative  POCT Microscopic Urinalysis ([facility])     Status: None   Collection Time: 11/13/17 10:31 AM  Result Value Ref Range   WBC,UR,HPF,POC None None WBC/hpf   RBC,UR,HPF,POC None None RBC/hpf   Bacteria None None, Too numerous to count   Mucus Absent Absent   Epithelial Cells, UR Per Microscopy None None, Too numerous to count cells/hpf  POCT CBC     Status: Abnormal   Collection Time: 11/13/17 10:51 AM  Result Value Ref Range   WBC 7.4 4.6 - 10.2 K/uL   Lymph, poc 2.5 0.6 - 3.4   POC LYMPH PERCENT 33.6 10 - 50 %L   MID (cbc) 0.4 0 - 0.9   POC MID % 5.5 0 - 12 %M   POC Granulocyte 4.5 2 - 6.9   Granulocyte percent 60.9 37 - 80 %G   RBC 4.66 4.04 - 5.48 M/uL   Hemoglobin 11.7 (A) 12.2 - 16.2 g/dL   HCT, POC 37.7 37.7 - 47.9 %   MCV 80.9 80 - 97 fL   MCH, POC 25.1 (A) 27 - 31.2 pg   MCHC 31.0 (A) 31.8 - 35.4 g/dL   RDW, POC 16.8 %   Platelet Count, POC 364 142 - 424 K/uL   MPV 6.4 0 - 99.8 fL  Comprehensive metabolic panel     Status: Abnormal   Collection Time: 11/13/17 11:57 AM  Result Value Ref Range   Glucose 122 (H) 65 - 99 mg/dL   BUN 13 6 - 24 mg/dL   Creatinine, Ser 0.73 0.57 - 1.00 mg/dL   GFR calc non Af Amer 96 >59 mL/min/1.73   GFR calc Af Amer 110 >59 mL/min/1.73   BUN/Creatinine Ratio 18 9 - 23   Sodium 135 134 - 144 mmol/L   Potassium 3.8 3.5 - 5.2 mmol/L   Chloride 96 96 - 106 mmol/L   CO2 22 20 - 29 mmol/L   Calcium 9.9 8.7 - 10.2 mg/dL   Total Protein 7.4 6.0 - 8.5 g/dL   Albumin 4.4 3.5 - 5.5 g/dL   Globulin, Total 3.0 1.5 - 4.5 g/dL   Albumin/Globulin Ratio 1.5 1.2 - 2.2   Bilirubin Total 0.2 0.0 - 1.2 mg/dL   Alkaline Phosphatase 68 39 - 117 IU/L   AST 10 0 - 40 IU/L   ALT 11 0 - 32 IU/L  Lipase     Status: None   Collection Time: 11/13/17 11:57 AM  Result Value Ref Range   Lipase 25 14 - 72 U/L    Objective: General: Patient is awake, alert,  and oriented x 3 and in no acute distress.  Integument: Skin is warm, dry and supple bilateral. Nails are tender, long, thickened and  dystrophic with subungual debris, consistent with onychomycosis, 1-5 bilateral. No signs of infection. No open lesions or preulcerative  lesions present bilateral. Remaining integument unremarkable.  Vasculature:  Dorsalis Pedis pulse 2/4 bilateral. Posterior Tibial pulse faint 1/4 bilateral.  Capillary fill time <3 sec 1-5 bilateral. Positive hair growth to the level of the digits. Temperature gradient within normal limits. + varicosities present bilateral. Trace edema present bilateral.   Neurology: The patient has intact sensation measured with a 5.07/10g Semmes Weinstein Monofilament at all pedal sites bilateral. Vibratory sensation slightly diminished bilateral with tuning fork. No Babinski sign present bilateral.   Musculoskeletal: No symptomatic pedal deformities noted bilateral. Muscular strength 5/5 in all lower extremity muscular groups bilateral without pain on range of motion . No tenderness with calf compression bilateral.  Assessment and Plan: Problem List Items Addressed This Visit    None    Visit Diagnoses    Pain due to onychomycosis of nail    -  Primary   Diabetes mellitus without complication (HCC)       PVD (peripheral vascular disease) ([place])         -Examined patient. -Discussed and educated patient on diabetic foot care, especially with  regards to the vascular, neurological and musculoskeletal systems.  -Mechanically debrided all nails 1-5 bilateral using sterile nail nipper and filed with dremel without incident  -Answered all patient questions -Patient to return  in 71 days for at risk foot care -Patient advised to call the office if any problems or questions arise in the meantime.  Landis Martins, DPM

## 2018-02-05 ENCOUNTER — Encounter: Payer: 59 | Admitting: Family Medicine

## 2018-02-05 ENCOUNTER — Telehealth: Payer: Self-pay | Admitting: Family Medicine

## 2018-02-05 NOTE — Telephone Encounter (Signed)
Called and spoke with pt regarding her appt with Dr. Brigitte Pulse on 03/02/18. She originally had an appt for a CPE at 9:00 am that day. Due to template changes, we needed to push her back to 10:00 AM that day. When I spoke with pt, she was not very happy. I explained that she could come in earlier and do her blood work and leave/come back at her appt time. She stated that she was not happy and we would hear about it during the survey process.  Pt will be here for her 10:00 appt on 03/02/18. I advised of time, building and she is aware of late policy.

## 2018-02-06 ENCOUNTER — Other Ambulatory Visit: Payer: Self-pay | Admitting: Family Medicine

## 2018-02-09 ENCOUNTER — Telehealth: Payer: Self-pay | Admitting: Family Medicine

## 2018-02-09 NOTE — Telephone Encounter (Signed)
Dr. Brigitte Pulse, This pt's plan does not cover the Livalo that you prescribed. Can you suggest an alternative from the list below that would be good?   Thank you!  The patient's drug benefit plan provides coverage for other drugs which may be considered for treating your patient. Can your patient's treatment be switched to a formulary drug? [If yes, provide your patient with a new prescription for the preferred product.] Available Formulary Alternatives: atorvastatin, ezetimibe-simvastatin, fluvastatin, lovastatin, pravastatin, rosuvastatin, simvastatin

## 2018-02-10 ENCOUNTER — Encounter: Payer: Self-pay | Admitting: Family Medicine

## 2018-02-12 ENCOUNTER — Encounter: Payer: Self-pay | Admitting: Vascular Surgery

## 2018-02-12 ENCOUNTER — Ambulatory Visit (INDEPENDENT_AMBULATORY_CARE_PROVIDER_SITE_OTHER): Payer: 59 | Admitting: *Deleted

## 2018-02-12 DIAGNOSIS — I83893 Varicose veins of bilateral lower extremities with other complications: Secondary | ICD-10-CM

## 2018-02-12 NOTE — Progress Notes (Signed)
X=.3% Sotradecol administered with a 27g butterfly.  Patient received a total of 10cc.  Retreated any areas that were still open on the front and back of both legs. She does not tolerate sclero well so just focused on small purple spiders. Easy access. She is healing as expected. This is her last ins covered tx. Follow prn.    Compression stockings applied: Yes.

## 2018-02-15 NOTE — Telephone Encounter (Signed)
I can suggest it but pt won't take it.  She was only willing to consider the livalo due to its very low side effect profile compared to all these others.  Will discuss w/ pt in more detail at her next visit.

## 2018-02-16 ENCOUNTER — Other Ambulatory Visit: Payer: Self-pay

## 2018-02-16 ENCOUNTER — Telehealth: Payer: Self-pay | Admitting: Gastroenterology

## 2018-02-16 ENCOUNTER — Ambulatory Visit: Payer: 59 | Admitting: Gastroenterology

## 2018-02-16 ENCOUNTER — Encounter: Payer: Self-pay | Admitting: Gastroenterology

## 2018-02-16 VITALS — BP 144/92 | HR 80 | Ht 70.25 in | Wt 277.0 lb

## 2018-02-16 DIAGNOSIS — K5901 Slow transit constipation: Secondary | ICD-10-CM

## 2018-02-16 DIAGNOSIS — R1084 Generalized abdominal pain: Secondary | ICD-10-CM

## 2018-02-16 MED ORDER — LINACLOTIDE 72 MCG PO CAPS: 72.0000 ug | ORAL_CAPSULE | Freq: Every day | ORAL | 3 refills | Status: DC

## 2018-02-16 NOTE — Progress Notes (Signed)
ELMIRE AMREIN    [id number]    03/26/1966  Primary Care Physician:Shaw, Laurey Arrow, MD  Referring Physician: Shawnee Knapp, MD [address] [place], [place] [address]  Chief complaint: Abdominal pain, constipation, diarrhea HPI:  52 year old female here for follow-up visit.  Last seen by Earnie Larsson December 30, 2017 with similar complaints. Patient took Linzess 72 mcg daily for 3 to 4 days and discontinued as she was having increased bowel frequency.  Has 1-2 small bowel movements, mostly pellets daily but she was having 4-5 large bowel movements.  She is having abdominal bloating and discomfort on the right side. Denies any rectal bleeding or dark stool.  She works 4 days a week and does not like having bowel movements while she is at work.  Denies any vomiting but has intermittent nausea.  No loss of appetite or weight loss.  She has generalized abdominal pain and discomfort  Abdominal x-ray July 2019 showed significantly increased stool burden  Colonoscopy 2009 normal other than small internal hemorrhoids  Mild delayed 2-hour gastric emptying but patient never did the 4-hour gastric emptying study that has been scheduled twice so far   Outpatient Encounter Medications as of 02/16/2018  Medication Sig  . albuterol (PROVENTIL HFA;VENTOLIN HFA) 108 (90 Base) MCG/ACT inhaler INHALE 2 PUFFS Q 4 TO 6 H PRN  . blood glucose meter kit and supplies KIT Dispense based on patient and insurance preference. Use up to four times daily as directed. (FOR ICD-9 250.00, 250.01).  Marland Kitchen linaclotide (LINZESS) 72 MCG capsule Take 1 capsule (72 mcg total) by mouth daily before breakfast.  . metformin (FORTAMET) 1000 MG (OSM) 24 hr tablet Take 2 tablets (2,000 mg total) by mouth daily with breakfast.  . olmesartan (BENICAR) 20 MG tablet Take 1 tablet (20 mg total) by mouth daily.  Marland Kitchen omeprazole (PRILOSEC) 40 MG capsule take 1 capsule by mouth once daily  . triamcinolone (NASACORT) 55 MCG/ACT  AERO nasal inhaler Place 2 sprays into the nose daily.  Marland Kitchen triamterene-hydrochlorothiazide (MAXZIDE-25) 37.5-25 MG tablet Take 1 tablet by mouth daily.  . vitamin B-12 (CYANOCOBALAMIN) 1000 MCG tablet Take 1,000 mcg by mouth daily.  . vitamin E 400 UNIT capsule Take 400 Units by mouth daily.  . Vitamin Mixture (VITAMIN E COMPLETE PO) Take by mouth.  . EPINEPHrine (EPI-PEN) 0.3 mg/0.3 mL DEVI Inject 0.3 mg into the muscle once as needed. For severe allergic reaction  . [DISCONTINUED] LIVALO 1 MG TABS TAKE 1 TABLET BY MOUTH ONCE DAILY   No facility-administered encounter medications on file as of 02/16/2018.     Allergies as of 02/16/2018 - Review Complete 02/16/2018  Allergen Reaction Noted  . Ace inhibitors Anaphylaxis 03/03/2008  . Shellfish allergy Anaphylaxis and Other (See Comments) 05/25/2011  . Januvia [sitagliptin] Other (See Comments) 10/05/2012  . Latex Itching   . Lipitor [atorvastatin]  02/10/2013    Past Medical History:  Diagnosis Date  . Allergy   . Asthma   . Diabetes mellitus   . Fatty liver   . Gastritis   . Gastroparesis    study 11 14  nl abd [place]  . GERD (gastroesophageal reflux disease)   . High cholesterol   . History of varicella   . Hypertension   . IBS (irritable bowel syndrome)     Past Surgical History:  Procedure Laterality Date  . ANAL RECTAL MANOMETRY N/A 04/03/2015   Procedure: ANO RECTAL MANOMETRY;  Surgeon: Harl Bowie  V, MD;  Location: [facility];  Service: Endoscopy;  Laterality: N/A;  . CHOLECYSTECTOMY    . WISDOM TOOTH EXTRACTION      Family History  Problem Relation Age of Onset  . Prostate cancer Father   . Diabetes Father   . Hypertension Father   . Diabetes Mother   . Heart disease Mother   . Arthritis Mother   . Hyperlipidemia Mother   . Hypertension Mother   . Parkinson's disease Mother   . Thyroid disease Mother   . Diabetes Paternal Grandmother   . Arthritis Paternal Grandmother   . Hyperlipidemia Paternal  Grandmother   . Heart disease Paternal Grandmother   . Dementia Paternal Grandmother   . Hypertension Paternal Grandmother   . Hyperlipidemia Maternal Grandmother   . Hypertension Maternal Grandmother   . Hyperlipidemia Maternal Grandfather   . Hypertension Maternal Grandfather   . Hyperlipidemia Paternal Grandfather   . Hypertension Paternal Grandfather   . Diabetes Paternal Grandfather   . Cholelithiasis Paternal Grandfather   . Colon cancer Neg Hx   . Esophageal cancer Neg Hx   . Stomach cancer Neg Hx   . Rectal cancer Neg Hx     Social History   Socioeconomic History  . Marital status: Single    Spouse name: Not on file  . Number of children: 0  . Years of education: Not on file  . Highest education level: Not on file  Occupational History  . Occupation: Marine scientist  Social Needs  . Financial resource strain: Not on file  . Food insecurity:    Worry: Not on file    Inability: Not on file  . Transportation needs:    Medical: Not on file    Non-medical: Not on file  Tobacco Use  . Smoking status: Never Smoker  . Smokeless tobacco: Never Used  Substance and Sexual Activity  . Alcohol use: No  . Drug use: No  . Sexual activity: Not on file  Lifestyle  . Physical activity:    Days per week: Not on file    Minutes per session: Not on file  . Stress: Not on file  Relationships  . Social connections:    Talks on phone: Not on file    Gets together: Not on file    Attends religious service: Not on file    Active member of club or organization: Not on file    Attends meetings of clubs or organizations: Not on file    Relationship status: Not on file  . Intimate partner violence:    Fear of current or ex partner: Not on file    Emotionally abused: Not on file    Physically abused: Not on file    Forced sexual activity: Not on file  Other Topics Concern  . Not on file  Social History Narrative   Usually # of hours of sleep per night: 6   3 of people living at  your residence? 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   Irregular sleep 7:30 to 11 or 12 and then 4 to 6:30 before she goes to work   Has attended college.   Originally from [place]   Abnormal pap remote last 2012       Review of systems: Review of Systems  Constitutional: Negative for fever and chills.  HENT: Negative.   Eyes: Negative for blurred vision.  Respiratory: Negative for cough, shortness of breath and wheezing.   Cardiovascular: Negative for chest pain and palpitations.  Gastrointestinal: as per HPI Genitourinary: Negative for dysuria, urgency, frequency and hematuria.  Musculoskeletal: Negative for myalgias, back pain and joint pain.  Skin: Negative for itching and rash.  Neurological: Negative for dizziness, tremors, focal weakness, seizures and loss of consciousness.  Endo/Heme/Allergies: Negative for seasonal allergies.  Psychiatric/Behavioral: Negative for depression, suicidal ideas and hallucinations.  All other systems reviewed and are negative.   Physical Exam: Vitals:   02/16/18 0953 02/16/18 1006  BP: (!) 150/100 (!) 144/92  Pulse: 80    Body mass index is 39.46 kg/m. Gen:      No acute distress HEENT:  EOMI, sclera anicteric Neck:     No masses; no thyromegaly Lungs:    Clear to auscultation bilaterally; normal respiratory effort CV:         Regular rate and rhythm; no murmurs Abd:      + bowel sounds; soft, non-tender; + palpable masses right lower quadrant and mid abdomen?  Increased stool burden, mild distension and tympanic Ext:    No edema; adequate peripheral perfusion Skin:      Warm and dry; no rash Neuro: alert and oriented x 3 Psych: normal mood and affect  Data Reviewed:  Reviewed labs, radiology imaging, old records and pertinent past GI work up   Assessment and Plan/Recommendations:  52 year old female  with irritable bowel syndrome predominant constipation, abdominal bloating and fullness. Advised patient to restart Linzess 72 mcg daily Increase dietary fiber and fluid intake  Due for colorectal cancer screening, last colonoscopy November 2009.  Will schedule colonoscopy during this visit.  She will need to do extended bowel prep due to increased stool burden The risks and benefits as well as alternatives of endoscopic procedure(s) have been discussed and reviewed. All questions answered. The patient agrees to proceed.  Though 2-hour gastric emptying study showed mild delay, is inadequate to diagnose gastroparesis.  Patient is reluctant to undergo repeat gastric emptying study.  Her symptoms are predominantly secondary to chronic constipation with increased stool burden    Damaris Hippo , MD [phone]    CC: Shawnee Knapp, MD

## 2018-02-16 NOTE — Telephone Encounter (Signed)
Corrected note for the patient at the front desk for pick up. She will come get it today or tomorrow. Declined to have it faxed at this time.

## 2018-02-16 NOTE — Patient Instructions (Signed)
You have been scheduled for a colonoscopy. Please follow written instructions given to you at your visit today.  Please pick up your prep supplies at the pharmacy within the next 1-3 days. If you use inhalers (even only as needed), please bring them with you on the day of your procedure.   If you are age 52 or older, your body mass index should be between 23-30. Your Body mass index is 39.46 kg/m. If this is out of the aforementioned range listed, please consider follow up with your Primary Care Provider.  If you are age 28 or younger, your body mass index should be between 19-25. Your Body mass index is 39.46 kg/m. If this is out of the aformentioned range listed, please consider follow up with your Primary Care Provider.    Thank you for choosing [facility]  Karleen Hampshire Nandigam,MD

## 2018-02-19 ENCOUNTER — Encounter: Payer: Self-pay | Admitting: Gastroenterology

## 2018-02-22 NOTE — Progress Notes (Signed)
Cardiology Office Note   Date:  02/24/2018   ID:  Maria WESTERGREN, DOB 08/21/1965, MRN [id number]  PCP:  Shawnee Knapp, MD  Cardiologist:   No primary care provider on file. Referring:  Shawnee Knapp, MD  Chief Complaint  Patient presents with  . Palpitations      History of Present Illness: Maria Sharp is a 52 y.o. female who presents for evaluation of chest pain.  I saw her in 2017.  She had a POET (Plain Old Exercise Treadmill) last in 2016 that was negative for ischemia.   She presents for evaluation of palpitations.  This is new problem.    The patient said that she was having these palpitations around September.  She is been stressed in her work at the [employer].  Also her mother has cancer and is in [place].  She said that she was feeling her heart flip flop.  She was not specifically describing skipping her palpitations.  She has not had any chest pressure, neck or arm discomfort.  This happened after she ate a large meal and was lying down.  She is had more acid reflux lately.  She had more constipation lately.  She is also had higher blood pressures.  She did not have any syncope or presyncope.  She has not had any chest pressure, neck or arm discomfort.  She was referred for evaluation of the palpitations.  When I had seen her previously we had done a work-up negative for ischemia and the follow-up was suggested as needed.  Past Medical History:  Diagnosis Date  . Allergy   . Asthma   . Diabetes mellitus   . Fatty liver   . Gastritis   . Gastroparesis    study 11 14  nl abd [place]  . GERD (gastroesophageal reflux disease)   . High cholesterol   . History of varicella   . Hypertension   . IBS (irritable bowel syndrome)     Past Surgical History:  Procedure Laterality Date  . ANAL RECTAL MANOMETRY N/A 04/03/2015   Procedure: ANO RECTAL MANOMETRY;  Surgeon: Mauri Pole, MD;  Location: [facility];  Service: Endoscopy;  Laterality: N/A;  .  CHOLECYSTECTOMY    . WISDOM TOOTH EXTRACTION     Family History  Problem Relation Age of Onset  . Prostate cancer Father   . Diabetes Father   . Hypertension Father   . Diabetes Mother   . Heart disease Mother   . Arthritis Mother   . Hyperlipidemia Mother   . Hypertension Mother   . Parkinson's disease Mother   . Thyroid disease Mother   . Diabetes Paternal Grandmother   . Arthritis Paternal Grandmother   . Hyperlipidemia Paternal Grandmother   . Heart disease Paternal Grandmother   . Dementia Paternal Grandmother   . Hypertension Paternal Grandmother   . Hyperlipidemia Maternal Grandmother   . Hypertension Maternal Grandmother   . Hyperlipidemia Maternal Grandfather   . Hypertension Maternal Grandfather   . Hyperlipidemia Paternal Grandfather   . Hypertension Paternal Grandfather   . Diabetes Paternal Grandfather   . Cholelithiasis Paternal Grandfather   . Colon cancer Neg Hx   . Esophageal cancer Neg Hx   . Stomach cancer Neg Hx   . Rectal cancer Neg Hx     Current Outpatient Medications  Medication Sig Dispense Refill  . albuterol (PROVENTIL HFA;VENTOLIN HFA) 108 (90 Base) MCG/ACT inhaler INHALE 2 PUFFS Q 4 TO 6 H PRN  0  . blood glucose meter kit and supplies KIT Dispense based on patient and insurance preference. Use up to four times daily as directed. (FOR ICD-9 250.00, 250.01). 1 each 0  . EPINEPHrine (EPI-PEN) 0.3 mg/0.3 mL DEVI Inject 0.3 mg into the muscle once as needed. For severe allergic reaction    . linaclotide (LINZESS) 72 MCG capsule Take 1 capsule (72 mcg total) by mouth daily before breakfast. 90 capsule 3  . metformin (FORTAMET) 1000 MG (OSM) 24 hr tablet Take 2 tablets (2,000 mg total) by mouth daily with breakfast. 180 tablet 1  . olmesartan (BENICAR) 40 MG tablet Take 1 tablet (40 mg total) by mouth daily. 90 tablet 3  . omeprazole (PRILOSEC) 40 MG capsule take 1 capsule by mouth once daily 90 capsule 3  . triamcinolone (NASACORT) 55 MCG/ACT AERO  nasal inhaler Place 2 sprays into the nose daily. 1 Inhaler 12  . triamterene-hydrochlorothiazide (MAXZIDE-25) 37.5-25 MG tablet Take 1 tablet by mouth daily. 90 tablet 3  . vitamin B-12 (CYANOCOBALAMIN) 1000 MCG tablet Take 1,000 mcg by mouth daily.    . vitamin E 400 UNIT capsule Take 400 Units by mouth daily.    . Vitamin Mixture (VITAMIN E COMPLETE PO) Take by mouth.     No current facility-administered medications for this visit.     Allergies:   Ace inhibitors; Shellfish allergy; Januvia [sitagliptin]; Latex; and Lipitor [atorvastatin]    ROS:  Please see the history of present illness.   Otherwise, review of systems are positive for none.   All other systems are reviewed and negative.    PHYSICAL EXAM: VS:  BP (!) 140/94 (BP Location: Left Arm, Patient Position: Sitting, Cuff Size: Large)   Pulse 95   Ht 5' 10" (1.778 m)   Wt 274 lb (124.3 kg)   BMI 39.31 kg/m  , BMI Body mass index is 39.31 kg/m. GENERAL:  Well appearing HEENT:  Pupils equal round and reactive, fundi not visualized, oral mucosa unremarkable NECK:  No jugular venous distention, waveform within normal limits, carotid upstroke brisk and symmetric, no bruits, no thyromegaly LYMPHATICS:  No cervical, inguinal adenopathy LUNGS:  Clear to auscultation bilaterally BACK:  No CVA tenderness CHEST:  Unremarkable HEART:  PMI not displaced or sustained,S1 and S2 within normal limits, no S3, no S4, no clicks, no rubs, no murmurs ABD:  Flat, positive bowel sounds normal in frequency in pitch, no bruits, no rebound, no guarding, no midline pulsatile mass, no hepatomegaly, no splenomegaly EXT:  2 plus pulses throughout, no edema, no cyanosis no clubbing SKIN:  No rashes no nodules NEURO:  Cranial nerves II through XII grossly intact, motor grossly intact throughout PSYCH:  Cognitively intact, oriented to person place and time   EKG:  EKG is not ordered today. The ekg ordered 01/13/18 demonstrates sinus rhythm, rate 85,  leftward axis, intervals within normal limits, no acute ST-T wave changes.   Recent Labs: 11/13/2017: ALT 11; BUN 13; Creatinine, Ser 0.73; Hemoglobin 11.7; Potassium 3.8; Sodium 135    Lipid Panel    Component Value Date/Time   CHOL 212 (H) 10/20/2017 0825   TRIG 148 10/20/2017 0825   HDL 53 10/20/2017 0825   CHOLHDL 4.0 10/20/2017 0825   CHOLHDL 3.3 10/03/2015 0937   VLDL 26 10/03/2015 0937   LDLCALC 129 (H) 10/20/2017 0825   LDLDIRECT 127.1 02/08/2013 0913      Wt Readings from Last 3 Encounters:  02/23/18 274 lb (124.3 kg)  02/16/18 277 lb (125.6  kg)  01/13/18 272 lb (123.4 kg)      Other studies Reviewed: Additional studies/ records that were reviewed today include: Labs. Review of the above records demonstrates:  Please see elsewhere in the note.     ASSESSMENT AND PLAN:  PALPITATIONS:   She has had palpitations but these have since resolved.  She is otherwise had a structurally normal heart and so I do not think further work-up is suggested.  I will treat the symptomatically if they recur.  They could be related to stress.  I do not think any change in therapy specifically for this is indicated.  I do note that she had a normal TSH although this was in 2018.  She has recurrent symptoms she need to have this evaluated.  Other blood work is up-to-date with a normal potassium.  OBESITY:  The patient understands the need to lose weight with diet and exercise. We have discussed specific strategies for this.  HTN: Her blood pressure is elevated and has been so fairly consistently over the last few readings.  She needs weight loss and exercise.  I am going to increase her Benicar to 40 mg daily.  She will get a blood pressure cuff and she was instructed on how to keep a diary.   Current medicines are reviewed at length with the patient today.  The patient does not have concerns regarding medicines.  The following changes have been made:  As above  Labs/ tests ordered  today include: None No orders of the defined types were placed in this encounter.    Disposition:   FU with me as needed.      Signed, Minus Breeding, MD  02/24/2018 8:01 PM    [place]

## 2018-02-23 ENCOUNTER — Ambulatory Visit: Payer: 59 | Admitting: Cardiology

## 2018-02-23 ENCOUNTER — Encounter: Payer: Self-pay | Admitting: Cardiology

## 2018-02-23 VITALS — BP 140/94 | HR 95 | Ht 70.0 in | Wt 274.0 lb

## 2018-02-23 DIAGNOSIS — R002 Palpitations: Secondary | ICD-10-CM

## 2018-02-23 DIAGNOSIS — I10 Essential (primary) hypertension: Secondary | ICD-10-CM

## 2018-02-23 DIAGNOSIS — E669 Obesity, unspecified: Secondary | ICD-10-CM

## 2018-02-23 MED ORDER — OLMESARTAN MEDOXOMIL 40 MG PO TABS: 40.0000 mg | ORAL_TABLET | Freq: Every day | ORAL | 3 refills | Status: DC

## 2018-02-23 NOTE — Patient Instructions (Signed)
Medication Instructions:  INCREASE- Benicar 40 mg daily  If you need a refill on your cardiac medications before your next appointment, please call your pharmacy.  Labwork: None Ordered  If you have labs (blood work) drawn today and your tests are completely normal, you will receive your results only by: Marland Kitchen MyChart Message (if you have MyChart) OR . A paper copy in the mail If you have any lab test that is abnormal or we need to change your treatment, we will call you to review the results.  Testing/Procedures: None Ordered  Special Instructions: [employer] Blood Pressure cuff  Follow-Up: . You will need a follow up appointment in As Needed.   At [facility], you and your health needs are our priority.  As part of our continuing mission to provide you with exceptional heart care, we have created designated Provider Care Teams.  These Care Teams include your primary Cardiologist (physician) and Advanced Practice Providers (APPs -  Physician Assistants and Nurse Practitioners) who all work together to provide you with the care you need, when you need it.   Thank you for choosing [facility]!!

## 2018-02-24 ENCOUNTER — Encounter: Payer: Self-pay | Admitting: Cardiology

## 2018-03-02 ENCOUNTER — Telehealth: Payer: Self-pay | Admitting: Gastroenterology

## 2018-03-02 ENCOUNTER — Encounter: Payer: Self-pay | Admitting: Family Medicine

## 2018-03-02 ENCOUNTER — Other Ambulatory Visit: Payer: Self-pay

## 2018-03-02 ENCOUNTER — Ambulatory Visit (INDEPENDENT_AMBULATORY_CARE_PROVIDER_SITE_OTHER): Payer: 59 | Admitting: Family Medicine

## 2018-03-02 VITALS — BP 148/88 | HR 80 | Temp 98.0°F | Resp 16 | Ht 70.0 in | Wt 278.0 lb

## 2018-03-02 DIAGNOSIS — Z1231 Encounter for screening mammogram for malignant neoplasm of breast: Secondary | ICD-10-CM | POA: Diagnosis not present

## 2018-03-02 DIAGNOSIS — E559 Vitamin D deficiency, unspecified: Secondary | ICD-10-CM | POA: Diagnosis not present

## 2018-03-02 DIAGNOSIS — E785 Hyperlipidemia, unspecified: Secondary | ICD-10-CM

## 2018-03-02 DIAGNOSIS — E1151 Type 2 diabetes mellitus with diabetic peripheral angiopathy without gangrene: Secondary | ICD-10-CM | POA: Diagnosis not present

## 2018-03-02 DIAGNOSIS — D649 Anemia, unspecified: Secondary | ICD-10-CM

## 2018-03-02 DIAGNOSIS — Z1329 Encounter for screening for other suspected endocrine disorder: Secondary | ICD-10-CM

## 2018-03-02 DIAGNOSIS — J328 Other chronic sinusitis: Secondary | ICD-10-CM

## 2018-03-02 DIAGNOSIS — Z136 Encounter for screening for cardiovascular disorders: Secondary | ICD-10-CM

## 2018-03-02 DIAGNOSIS — Z1389 Encounter for screening for other disorder: Secondary | ICD-10-CM

## 2018-03-02 DIAGNOSIS — I10 Essential (primary) hypertension: Secondary | ICD-10-CM | POA: Diagnosis not present

## 2018-03-02 DIAGNOSIS — Z0000 Encounter for general adult medical examination without abnormal findings: Secondary | ICD-10-CM

## 2018-03-02 DIAGNOSIS — Z23 Encounter for immunization: Secondary | ICD-10-CM | POA: Diagnosis not present

## 2018-03-02 DIAGNOSIS — Z1212 Encounter for screening for malignant neoplasm of rectum: Secondary | ICD-10-CM

## 2018-03-02 DIAGNOSIS — Z1383 Encounter for screening for respiratory disorder NEC: Secondary | ICD-10-CM

## 2018-03-02 DIAGNOSIS — Z1211 Encounter for screening for malignant neoplasm of colon: Secondary | ICD-10-CM | POA: Diagnosis not present

## 2018-03-02 LAB — POCT URINALYSIS DIP (MANUAL ENTRY)
Bilirubin, UA: NEGATIVE
Blood, UA: NEGATIVE
Glucose, UA: NEGATIVE mg/dL
Ketones, POC UA: NEGATIVE mg/dL
Nitrite, UA: NEGATIVE
Protein Ur, POC: NEGATIVE mg/dL
Spec Grav, UA: 1.02 (ref 1.010–1.025)
Urobilinogen, UA: 0.2 E.U./dL
pH, UA: 6 (ref 5.0–8.0)

## 2018-03-02 MED ORDER — ZOSTER VAC RECOMB ADJUVANTED 50 MCG/0.5ML IM SUSR: 0.5000 mL | Freq: Once | INTRAMUSCULAR | 1 refills | Status: AC

## 2018-03-02 MED ORDER — SPIRONOLACTONE 25 MG PO TABS: 25.0000 mg | ORAL_TABLET | Freq: Two times a day (BID) | ORAL | 1 refills | Status: DC

## 2018-03-02 MED ORDER — ROSUVASTATIN CALCIUM 5 MG PO TABS: 2.5000 mg | ORAL_TABLET | ORAL | 1 refills | Status: DC

## 2018-03-02 MED ORDER — NA SULFATE-K SULFATE-MG SULF 17.5-3.13-1.6 GM/177ML PO SOLN: 1.0000 | Freq: Once | ORAL | 0 refills | Status: AC

## 2018-03-02 MED ORDER — CHLORTHALIDONE 25 MG PO TABS: 25.0000 mg | ORAL_TABLET | Freq: Every day | ORAL | 1 refills | Status: DC

## 2018-03-02 NOTE — Patient Instructions (Addendum)
Make sure you are taking a daily calcium/vitamin D supplement - and twice a day would be great. Try to find a chewable calcium CITRATE supplement with 400 to 600 mg of calcium in it and as much vitamin D as you can.  Take this at least once a day, and not with other calcium sources for maximum absorption. Look for an off-brand that is like "Citracal" or "Caltrate" which you will be able to absorb better that the calcium CARBONATE products while you are on medicines for acid reflux.   Call with what type of glucometer make and model you have and what type of test strips you need so we can send a new rx that will be covered by your insurance.  Restart daily cetrizine (zyrtec), tramcinolone nasal spray (nasocort) and continue singulair.  Get a home BP cuff to monitor your BP.    If you have lab work done today you will be contacted with your lab results within the next 2 weeks.  If you have not heard from [place] then please contact us. The fastest way to get your results is to register for My Chart.   IF you received an x-ray today, you will receive an invoice from [facility]. Please contact [facility] at [phone] with questions or concerns regarding your invoice.   IF you received labwork today, you will receive an invoice from [facility]. Please contact [facility] at [phone] with questions or concerns regarding your invoice.   Our billing staff will not be able to assist you with questions regarding bills from these companies.  You will be contacted with the lab results as soon as they are available. The fastest way to get your results is to activate your My Chart account. Instructions are located on the last page of this paperwork. If you have not heard from [place] regarding the results in 2 weeks, please contact this office.     Influenza (Flu) Vaccine (Inactivated or Recombinant): What You Need to Know 1. Why get vaccinated? Influenza ("flu") is a contagious disease that  spreads around the [place] every year, usually between October and May. Flu is caused by influenza viruses, and is spread mainly by coughing, sneezing, and close contact. Anyone can get flu. Flu strikes suddenly and can last several days. Symptoms vary by age, but can include:  fever/chills  sore throat  muscle aches  fatigue  cough  headache  runny or stuffy nose  Flu can also lead to pneumonia and blood infections, and cause diarrhea and seizures in children. If you have a medical condition, such as heart or lung disease, flu can make it worse. Flu is more dangerous for some people. Infants and young children, people 62 years of age and older, pregnant women, and people with certain health conditions or a weakened immune system are at greatest risk. Each year thousands of people in the [place] die from flu, and many more are hospitalized. Flu vaccine can:  keep you from getting flu,  make flu less severe if you do get it, and  keep you from spreading flu to your family and other people. 2. Inactivated and recombinant flu vaccines A dose of flu vaccine is recommended every flu season. Children 6 months through 43 years of age may need two doses during the same flu season. Everyone else needs only one dose each flu season. Some inactivated flu vaccines contain a very small amount of a mercury-based preservative called thimerosal. Studies have not shown thimerosal in  vaccines to be harmful, but flu vaccines that do not contain thimerosal are available. There is no live flu virus in flu shots. They cannot cause the flu. There are many flu viruses, and they are always changing. Each year a new flu vaccine is made to protect against three or four viruses that are likely to cause disease in the upcoming flu season. But even when the vaccine doesn't exactly match these viruses, it may still provide some protection. Flu vaccine cannot prevent:  flu that is caused by a virus not  covered by the vaccine, or  illnesses that look like flu but are not.  It takes about 2 weeks for protection to develop after vaccination, and protection lasts through the flu season. 3. Some people should not get this vaccine Tell the person who is giving you the vaccine:  If you have any severe, life-threatening allergies. If you ever had a life-threatening allergic reaction after a dose of flu vaccine, or have a severe allergy to any part of this vaccine, you may be advised not to get vaccinated. Most, but not all, types of flu vaccine contain a small amount of egg protein.  If you ever had Guillain-Barr Syndrome (also called GBS). Some people with a history of GBS should not get this vaccine. This should be discussed with your doctor.  If you are not feeling well. It is usually okay to get flu vaccine when you have a mild illness, but you might be asked to come back when you feel better.  4. Risks of a vaccine reaction With any medicine, including vaccines, there is a chance of reactions. These are usually mild and go away on their own, but serious reactions are also possible. Most people who get a flu shot do not have any problems with it. Minor problems following a flu shot include:  soreness, redness, or swelling where the shot was given  hoarseness  sore, red or itchy eyes  cough  fever  aches  headache  itching  fatigue  If these problems occur, they usually begin soon after the shot and last 1 or 2 days. More serious problems following a flu shot can include the following:  There may be a small increased risk of Guillain-Barre Syndrome (GBS) after inactivated flu vaccine. This risk has been estimated at 1 or 2 additional cases per million people vaccinated. This is much lower than the risk of severe complications from flu, which can be prevented by flu vaccine.  Young children who get the flu shot along with pneumococcal vaccine (PCV13) and/or DTaP vaccine at the  same time might be slightly more likely to have a seizure caused by fever. Ask your doctor for more information. Tell your doctor if a child who is getting flu vaccine has ever had a seizure.  Problems that could happen after any injected vaccine:  People sometimes faint after a medical procedure, including vaccination. Sitting or lying down for about 15 minutes can help prevent fainting, and injuries caused by a fall. Tell your doctor if you feel dizzy, or have vision changes or ringing in the ears.  Some people get severe pain in the shoulder and have difficulty moving the arm where a shot was given. This happens very rarely.  Any medication can cause a severe allergic reaction. Such reactions from a vaccine are very rare, estimated at about 1 in a million doses, and would happen within a few minutes to a few hours after the vaccination. As with any  medicine, there is a very remote chance of a vaccine causing a serious injury or death. The safety of vaccines is always being monitored. For more information, visit: http://www.aguilar.org/ 5. What if there is a serious reaction? What should I look for? Look for anything that concerns you, such as signs of a severe allergic reaction, very high fever, or unusual behavior. Signs of a severe allergic reaction can include hives, swelling of the face and throat, difficulty breathing, a fast heartbeat, dizziness, and weakness. These would start a few minutes to a few hours after the vaccination. What should I do?  If you think it is a severe allergic reaction or other emergency that can't wait, call 9-1-1 and get the person to the nearest hospital. Otherwise, call your doctor.  Reactions should be reported to the Vaccine Adverse Event Reporting System (VAERS). Your doctor should file this report, or you can do it yourself through the VAERS web site at www.vaers.SamedayNews.es, or by calling (925) 266-0272. ? VAERS does not give medical advice. 6. The  National Vaccine Injury Compensation Program The [employer] Vaccine Injury Compensation Program (VICP) is a federal program that was created to compensate people who may have been injured by certain vaccines. Persons who believe they may have been injured by a vaccine can learn about the program and about filing a claim by calling 908-259-2525 or visiting the [place] website at GoldCloset.com.ee. There is a time limit to file a claim for compensation. 7. How can I learn more?  Ask your healthcare provider. He or she can give you the vaccine package insert or suggest other sources of information.  Call your local or state health department.  Contact the Centers for Disease Control and Prevention (CDC): ? Call 530 046 6951 (1-800-CDC-INFO) or ? Visit CDC's website at https://gibson.com/ Vaccine Information Statement, Inactivated Influenza Vaccine (11/26/2013) This information is not intended to replace advice given to you by your health care provider. Make sure you discuss any questions you have with your health care provider. Document Released: 01/31/2006 Document Revised: 12/28/2015 Document Reviewed: 12/28/2015 Elsevier Interactive Patient Education  2017 [place].  How to Take Your Blood Pressure You can take your blood pressure at home with a machine. You may need to check your blood pressure at home:  To check if you have high blood pressure (hypertension).  To check your blood pressure over time.  To make sure your blood pressure medicine is working.  Supplies needed: You will need a blood pressure machine, or monitor. You can buy one at a drugstore or online. When choosing one:  Choose one with an arm cuff.  Choose one that wraps around your upper arm. Only one finger should fit between your arm and the cuff.  Do not choose one that measures your blood pressure from your wrist or finger.  Your doctor can suggest a monitor. How to prepare Avoid these things  for 30 minutes before checking your blood pressure:  Drinking caffeine.  Drinking alcohol.  Eating.  Smoking.  Exercising.  Five minutes before checking your blood pressure:  Pee.  Sit in a dining chair. Avoid sitting in a soft couch or armchair.  Be quiet. Do not talk.  How to take your blood pressure Follow the instructions that came with your machine. If you have a digital blood pressure monitor, these may be the instructions: 1. Sit up straight. 2. Place your feet on the floor. Do not cross your ankles or legs. 3. Rest your left arm at the level of  your heart. You may rest it on a table, desk, or chair. 4. Pull up your shirt sleeve. 5. Wrap the blood pressure cuff around the upper part of your left arm. The cuff should be 1 inch (2.5 cm) above your elbow. It is best to wrap the cuff around bare skin. 6. Fit the cuff snugly around your arm. You should be able to place only one finger between the cuff and your arm. 7. Put the cord inside the groove of your elbow. 8. Press the power button. 9. Sit quietly while the cuff fills with air and loses air. 10. Write down the numbers on the screen. 11. Wait 2-3 minutes and then repeat steps 1-10.  What do the numbers mean? Two numbers make up your blood pressure. The first number is called systolic pressure. The second is called diastolic pressure. An example of a blood pressure reading is "120 over 80" (or 120/80). If you are an adult and do not have a medical condition, use this guide to find out if your blood pressure is normal: Normal  First number: below 120.  Second number: below 80. Elevated  First number: 120-129.  Second number: below 80. Hypertension stage 1  First number: 130-139.  Second number: 80-89. Hypertension stage 2  First number: 140 or above.  Second number: 59 or above. Your blood pressure is above normal even if only the top or bottom number is above normal. Follow these instructions at  home:  Check your blood pressure as often as your doctor tells you to.  Take your monitor to your next doctor's appointment. Your doctor will: ? Make sure you are using it correctly. ? Make sure it is working right.  Make sure you understand what your blood pressure numbers should be.  Tell your doctor if your medicines are causing side effects. Contact a doctor if:  Your blood pressure keeps being high. Get help right away if:  Your first blood pressure number is higher than 180.  Your second blood pressure number is higher than 120. This information is not intended to replace advice given to you by your health care provider. Make sure you discuss any questions you have with your health care provider. Document Released: 03/21/2008 Document Revised: 03/06/2016 Document Reviewed: 09/15/2015 Elsevier Interactive Patient Education  2018 [place]. Blood Pressure Record Sheet Your blood pressure on this visit to the emergency department or clinic is elevated. This does not necessarily mean you have high blood pressure (hypertension), but it does mean that your blood pressure needs to be rechecked. Many times your blood pressure can increase due to illness, pain, anxiety, or other factors. We recommend that you get a series of blood pressure readings done over a period of 5 days. It is best to get a reading in the morning and one in the evening. You should make sure to sit and relax for 1-5 minutes before the reading is taken. Write the readings down and make a follow-up appointment with your health care provider to discuss the results. If there is not a free clinic or a drug store with a blood-pressure-taking machine near you, you can purchase blood-pressure-taking equipment from a drug store. Having one in the home allows you the convenience of taking your blood pressure while you are home and relaxed. Blood Pressure Log Date: _______________________  a.m. _____________________  p.m.  _____________________  Date: _______________________  a.m. _____________________  p.m. _____________________  Date: _______________________  a.m. _____________________  p.m. _____________________  Date: _______________________  a.m. _____________________  p.m. _____________________  Date: _______________________  a.m. _____________________  p.m. _____________________  This information is not intended to replace advice given to you by your health care provider. Make sure you discuss any questions you have with your health care provider. Document Released: 01/05/2003 Document Revised: 03/22/2016 Document Reviewed: 06/01/2013 Elsevier Interactive Patient Education  2018 [employer].

## 2018-03-02 NOTE — Progress Notes (Addendum)
Subjective:    Patient ID: Maria Sharp; female   DOB: [birth year]; 52 y.o.   MRN: [id number]  Chief Complaint  Patient presents with  . Annual Exam    HPI Primary Preventative Screenings: Cervical Cancer: Saw gyn 2-3 wks ago with pelvic exam  Family Planning: last menses in Sept and none since STI screening: Breast Cancer: Does mammogram at [place] annually Colorectal Cancer:scheduled to have done with Dr. Silverio Decamp next week.  Tobacco use/EtOH/substances: Bone Density: Cardiac: EKG 01/13/18 Weight/Blood sugar/Diet/Exercise: planning to resume exericse BMI Readings from Last 3 Encounters:  03/02/18 39.89 kg/m  02/23/18 39.31 kg/m  02/16/18 39.46 kg/m   Lab Results  Component Value Date   HGBA1C 6.8 (A) 10/20/2017   OTC/Vit/Supp/Herbal: vit B, D, E. Not supp w/ calcium  Dentist/Optho:  Dr. [name] eye exam once a year and 6 mo dentist Immunizations:  Immunization History  Administered Date(s) Administered  . Influenza,inj,Quad PF,6+ Mos 02/08/2013, 02/07/2014, 02/17/2017  . Influenza-Unspecified 02/07/2014  . Pneumococcal Conjugate-13 02/08/2013  . Pneumococcal Polysaccharide-23 12/20/2013  . Pneumococcal-Unspecified 04/22/2004  . Tdap 01/01/2012, 06/14/2013     Chronic Medical Conditions: HTN: Saw Dr. Percival Spanish after she had her benicar increased  From 49 to 13 and hasn't happened since.  She will purchase a BP cuff outside of the offcie and start to monitor. Has been on the higher dose for about week.    Sclerotherapy on varicose veins - which has helped and just stopped wearing the thigh highs.    Diarrhea: Saw Dr. Silverio Decamp 2 wks ago and colonoscopy sched for December. Continues to alternate between constipation and diarrhea. Was rx'd Linzess but causes severe diarrhea - she has a detailed bowel prep she sig going to take prior - colon feels to hard on one side so Dr. Silverio Decamp is wondering if she has a bowel impraction.   Mother diagnosed w/ ovarian  cancer at 75 yo last year but now she has a spot on her stomach August and has an appointment in Dec to see what is going on. It is cancer on her stomach but was to small to treat as she is 52 yo. This is made her BP go up - she has stopped eating fried foods (other than fries twice a month) and stopped pork. She has been unable to exercise until sclerotherapy is done - she going ot go back to the [facility] and start walking which has worked well for her in the past.    Sinuses: Seeing Dr. Abram Sander.  Has pain over eyebrows under eyes, temples, jaws. She is not taking any otc meds other than singulair and have been off the allergies shots for several weeks as hasn't been able to make it there.  Occ takes benadryl or zyrtec when she eats somethijng that gives her hives. Can't hear so thinks she needs her ears cleaned out.   DM: Not checking cbgs lately.    HLD: Ins will not pay for pitavastatin but she is willing to try something else - has failed atorvastatin 10 and 20, lovastatin 40, pravastatin 20. Never able to fill the livalo due to cost   Medical History: Past Medical History:  Diagnosis Date  . Allergy   . Asthma   . Diabetes mellitus   . Fatty liver   . Gastritis   . Gastroparesis    study 11 14  nl abd [place]  . GERD (gastroesophageal reflux disease)   . High cholesterol   . History of varicella   .  Hypertension   . IBS (irritable bowel syndrome)    Past Surgical History:  Procedure Laterality Date  . ANAL RECTAL MANOMETRY N/A 04/03/2015   Procedure: ANO RECTAL MANOMETRY;  Surgeon: Mauri Pole, MD;  Location: [facility];  Service: Endoscopy;  Laterality: N/A;  . CHOLECYSTECTOMY    . WISDOM TOOTH EXTRACTION     Current Outpatient Medications on File Prior to Visit  Medication Sig Dispense Refill  . albuterol (PROVENTIL HFA;VENTOLIN HFA) 108 (90 Base) MCG/ACT inhaler INHALE 2 PUFFS Q 4 TO 6 H PRN  0  . blood glucose meter kit and supplies KIT Dispense based on patient and  insurance preference. Use up to four times daily as directed. (FOR ICD-9 250.00, 250.01). 1 each 0  . EPINEPHrine (EPI-PEN) 0.3 mg/0.3 mL DEVI Inject 0.3 mg into the muscle once as needed. For severe allergic reaction    . linaclotide (LINZESS) 72 MCG capsule Take 1 capsule (72 mcg total) by mouth daily before breakfast. 90 capsule 3  . metformin (FORTAMET) 1000 MG (OSM) 24 hr tablet Take 2 tablets (2,000 mg total) by mouth daily with breakfast. 180 tablet 1  . olmesartan (BENICAR) 40 MG tablet Take 1 tablet (40 mg total) by mouth daily. 90 tablet 3  . omeprazole (PRILOSEC) 40 MG capsule take 1 capsule by mouth once daily 90 capsule 3  . triamcinolone (NASACORT) 55 MCG/ACT AERO nasal inhaler Place 2 sprays into the nose daily. 1 Inhaler 12  . triamterene-hydrochlorothiazide (MAXZIDE-25) 37.5-25 MG tablet Take 1 tablet by mouth daily. 90 tablet 3  . vitamin B-12 (CYANOCOBALAMIN) 1000 MCG tablet Take 1,000 mcg by mouth daily.    . vitamin E 400 UNIT capsule Take 400 Units by mouth daily.    . Vitamin Mixture (VITAMIN E COMPLETE PO) Take by mouth.     No current facility-administered medications on file prior to visit.    Allergies  Allergen Reactions  . Ace Inhibitors Anaphylaxis  . Shellfish Allergy Anaphylaxis and Other (See Comments)    Only shrimp  . Januvia [Sitagliptin] Other (See Comments)    Recurrent abdominal pain with elevated pancreas enzymes  . Latex Itching  . Lipitor [Atorvastatin]    Family History  Problem Relation Age of Onset  . Prostate cancer Father   . Diabetes Father   . Hypertension Father   . Diabetes Mother   . Heart disease Mother   . Arthritis Mother   . Hyperlipidemia Mother   . Hypertension Mother   . Parkinson's disease Mother   . Thyroid disease Mother   . Diabetes Paternal Grandmother   . Arthritis Paternal Grandmother   . Hyperlipidemia Paternal Grandmother   . Heart disease Paternal Grandmother   . Dementia Paternal Grandmother   .  Hypertension Paternal Grandmother   . Hyperlipidemia Maternal Grandmother   . Hypertension Maternal Grandmother   . Hyperlipidemia Maternal Grandfather   . Hypertension Maternal Grandfather   . Hyperlipidemia Paternal Grandfather   . Hypertension Paternal Grandfather   . Diabetes Paternal Grandfather   . Cholelithiasis Paternal Grandfather   . Colon cancer Neg Hx   . Esophageal cancer Neg Hx   . Stomach cancer Neg Hx   . Rectal cancer Neg Hx    Social History   Socioeconomic History  . Marital status: Single    Spouse name: Not on file  . Number of children: 0  . Years of education: Not on file  . Highest education level: Not on file  Occupational History  .  Occupation: Marine scientist  Social Needs  . Financial resource strain: Not on file  . Food insecurity:    Worry: Not on file    Inability: Not on file  . Transportation needs:    Medical: Not on file    Non-medical: Not on file  Tobacco Use  . Smoking status: Never Smoker  . Smokeless tobacco: Never Used  Substance and Sexual Activity  . Alcohol use: No  . Drug use: No  . Sexual activity: Not on file  Lifestyle  . Physical activity:    Days per week: Not on file    Minutes per session: Not on file  . Stress: Not on file  Relationships  . Social connections:    Talks on phone: Not on file    Gets together: Not on file    Attends religious service: Not on file    Active member of club or organization: Not on file    Attends meetings of clubs or organizations: Not on file    Relationship status: Not on file  Other Topics Concern  . Not on file  Social History Narrative   Usually # of hours of sleep per night: 6   3 of people living at your residence? 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   Irregular sleep 7:30 to 11 or 12 and then 4 to 6:30 before she goes to work   Has attended college.   Originally from [place]   Abnormal pap remote last 2012    Depression screen [facility] 2/9 03/02/2018 01/13/2018 11/13/2017 10/20/2017 06/23/2017  Decreased Interest 0 0 0 0 0  Down, Depressed, Hopeless 0 0 0 0 0  PHQ - 2 Score 0 0 0 0 0     ROS As noted in HPI All others reviewed and negative.     Objective:  BP (!) 146/85   Pulse 80   Temp 98 F (36.7 C) (Oral)   Resp 16   Ht 5' 10"  (1.778 m)   Wt 278 lb (126.1 kg)   SpO2 99%   BMI 39.89 kg/m   Visual Acuity Screening   Right eye Left eye Both eyes  Without correction: 20/30 20/20 20/20   With correction:      Physical Exam  Constitutional: She is oriented to person, place, and time. She appears well-developed and well-nourished. No distress.  HENT:  Head: Normocephalic and atraumatic.  Right Ear: Tympanic membrane, external ear and ear canal normal.  Left Ear: Tympanic membrane, external ear and ear canal normal.  Nose: Nose normal. No mucosal edema or rhinorrhea.  Mouth/Throat: Uvula is midline, oropharynx is clear and moist and mucous membranes are normal. No posterior oropharyngeal erythema.  Eyes: Pupils are equal, round, and reactive to light. Conjunctivae and EOM are normal. Right eye exhibits no discharge. Left eye exhibits no discharge. No scleral icterus.  Neck: Normal range of motion. Neck supple. No thyromegaly present.  Cardiovascular: Normal rate, regular rhythm, normal heart sounds and intact distal pulses.  Pulmonary/Chest: Effort normal and breath sounds normal. No respiratory distress.  Abdominal: Soft. Bowel sounds are normal. There is no tenderness.  Musculoskeletal: She exhibits no edema.  Lymphadenopathy:    She has no cervical adenopathy.  Neurological: She is alert and oriented to person, place, and time. She has normal reflexes.  Skin: Skin is  warm and dry. She is not diaphoretic. No erythema.  Psychiatric: She has a normal mood and affect. Her behavior is normal.    [facility] Office Visit on  03/02/2018  Component Date Value Ref Range Status  . Color, UA 03/02/2018 yellow  yellow Final  . Clarity, UA 03/02/2018 clear  clear Final  . Glucose, UA 03/02/2018 negative  negative mg/dL Final  . Bilirubin, UA 03/02/2018 negative  negative Final  . Ketones, POC UA 03/02/2018 negative  negative mg/dL Final  . Spec Grav, UA 03/02/2018 1.020  1.010 - 1.025 Final  . Blood, UA 03/02/2018 negative  negative Final  . pH, UA 03/02/2018 6.0  5.0 - 8.0 Final  . Protein Ur, POC 03/02/2018 negative  negative mg/dL Final  . Urobilinogen, UA 03/02/2018 0.2  0.2 or 1.0 E.U./dL Final  . Nitrite, UA 03/02/2018 Negative  Negative Final  . Leukocytes, UA 03/02/2018 Trace* Negative Final     Assessment & Plan:   1. Annual physical exam   2. Screening for cardiovascular, respiratory, and genitourinary diseases   3. Encounter for screening mammogram for breast cancer   4. Screening for colorectal cancer   5. Screening for thyroid disorder   6. Type 2 diabetes mellitus with peripheral circulatory disorder (HCC) - continue metformin xr 2g qam - restart exercise Lab Results  Component Value Date   HGBA1C 7.0 (H) 03/02/2018   HGBA1C 6.8 (A) 10/20/2017   HGBA1C 7.0 06/23/2017     7. Vitamin D deficiency - on otc vit D supp - increase by 1000iu qd  8. Hyperlipidemia LDL goal <100 - has failed atorvastatin 10 and 20, lovastatin 40, pravastatin 20. Never able to fill the livalo due to cost - try 1/2 tab crestor 2.5 qod  9. Essential hypertension - BP uncontrolled so stop triamterene-hctz 37.5-25 and start chlorthalidone 25 and spironolactone 25 bid instead, cont olmesartan 40  10. Anemia, unspecified type   11.    Chronic rhinosinusitis - Seen by allergy prior If wbc elev send for antibiotic for sinus infeciton. - THEY ARE NORMAL> Restart zyrtec, nasal spray, andcont monelukast.   Patient will continue on current chronic medications other than changes noted above, so ok to refill when needed.     Reviewed all health maintenance recommendations per USPSTF guidelines.   See after visit summary for patient specific instructions.  Orders Placed This Encounter  Procedures  . Flu Vaccine QUAD 36+ mos IM  . CBC with Differential/Platelet  . Comprehensive metabolic panel    Order Specific Question:   Has the patient fasted?    Answer:   No  . Lipid panel    Order Specific Question:   Has the patient fasted?    Answer:   No  . TSH  . Hemoglobin A1c  . VITAMIN D 25 Hydroxy (Vit-D Deficiency, Fractures)  . Ferritin  . POCT urinalysis dipstick    Meds ordered this encounter  Medications  . rosuvastatin (CRESTOR) 5 MG tablet    Sig: Take 0.5 tablets (2.5 mg total) by mouth every other day.    Dispense:  45 tablet    Refill:  1  . Zoster Vaccine Adjuvanted [facility]) injection    Sig: Inject 0.5 mLs into the muscle once for 1 dose. Repeat once in 2 to 6 months    Dispense:  0.5 mL    Refill:  1  . chlorthalidone (HYGROTON) 25 MG tablet    Sig: Take 1 tablet (25 mg total) by mouth daily.  Dispense:  90 tablet    Refill:  1    D/c triamterine-hctz  . spironolactone (ALDACTONE) 25 MG tablet    Sig: Take 1 tablet (25 mg total) by mouth 2 (two) times daily.    Dispense:  180 tablet    Refill:  1    Patient verbalized to me that they understand the following: diagnosis, what is being done for them, what to expect and what should be done at home.  Their questions have been answered. They understand that I am unable to predict every possible medication interaction or adverse outcome and that if any unexpected symptoms arise, they should contact us and their pharmacist, as well as never hesitate to seek urgent/emergent care at [facility] or ER if they think it might be warranted.    [name], MD, MPH Primary Care at [facility] [place], [place]  [phone] Office phone  [phone] Office fax   03/02/18 10:42 AM

## 2018-03-02 NOTE — Telephone Encounter (Signed)
Suprep sent to pharmacy 

## 2018-03-03 LAB — CBC WITH DIFFERENTIAL/PLATELET
Basophils Absolute: 0 10*3/uL (ref 0.0–0.2)
Basos: 0 %
EOS (ABSOLUTE): 0.1 10*3/uL (ref 0.0–0.4)
Eos: 1 %
Hematocrit: 36.1 % (ref 34.0–46.6)
Hemoglobin: 12 g/dL (ref 11.1–15.9)
Immature Grans (Abs): 0 10*3/uL (ref 0.0–0.1)
Immature Granulocytes: 0 %
Lymphocytes Absolute: 2.5 10*3/uL (ref 0.7–3.1)
Lymphs: 34 %
MCH: 26.6 pg (ref 26.6–33.0)
MCHC: 33.2 g/dL (ref 31.5–35.7)
MCV: 80 fL (ref 79–97)
Monocytes Absolute: 0.7 10*3/uL (ref 0.1–0.9)
Monocytes: 10 %
Neutrophils Absolute: 4 10*3/uL (ref 1.4–7.0)
Neutrophils: 55 %
Platelets: 354 10*3/uL (ref 150–450)
RBC: 4.51 x10E6/uL (ref 3.77–5.28)
RDW: 15.2 % (ref 12.3–15.4)
WBC: 7.2 10*3/uL (ref 3.4–10.8)

## 2018-03-03 LAB — COMPREHENSIVE METABOLIC PANEL
ALT: 17 IU/L (ref 0–32)
AST: 12 IU/L (ref 0–40)
Albumin/Globulin Ratio: 1.4 (ref 1.2–2.2)
Albumin: 4.2 g/dL (ref 3.5–5.5)
Alkaline Phosphatase: 59 IU/L (ref 39–117)
BUN/Creatinine Ratio: 19 (ref 9–23)
BUN: 13 mg/dL (ref 6–24)
Bilirubin Total: 0.3 mg/dL (ref 0.0–1.2)
CO2: 26 mmol/L (ref 20–29)
Calcium: 10 mg/dL (ref 8.7–10.2)
Chloride: 97 mmol/L (ref 96–106)
Creatinine, Ser: 0.7 mg/dL (ref 0.57–1.00)
GFR calc Af Amer: 116 mL/min/{1.73_m2} (ref >59)
GFR calc non Af Amer: 101 mL/min/{1.73_m2} (ref >59)
Globulin, Total: 3.1 g/dL (ref 1.5–4.5)
Glucose: 123 mg/dL — ABNORMAL HIGH (ref 65–99)
Potassium: 4 mmol/L (ref 3.5–5.2)
Sodium: 140 mmol/L (ref 134–144)
Total Protein: 7.3 g/dL (ref 6.0–8.5)

## 2018-03-03 LAB — LIPID PANEL
Chol/HDL Ratio: 3.9 ratio (ref 0.0–4.4)
Cholesterol, Total: 203 mg/dL — ABNORMAL HIGH (ref 100–199)
HDL: 52 mg/dL (ref >39)
LDL Calculated: 122 mg/dL — ABNORMAL HIGH (ref 0–99)
Triglycerides: 146 mg/dL (ref 0–149)
VLDL Cholesterol Cal: 29 mg/dL (ref 5–40)

## 2018-03-03 LAB — FERRITIN: Ferritin: 47 ng/mL (ref 15–150)

## 2018-03-03 LAB — VITAMIN D 25 HYDROXY (VIT D DEFICIENCY, FRACTURES): Vit D, 25-Hydroxy: 29.6 ng/mL — ABNORMAL LOW (ref 30.0–100.0)

## 2018-03-03 LAB — HEMOGLOBIN A1C
Est. average glucose Bld gHb Est-mCnc: 154 mg/dL
Hgb A1c MFr Bld: 7 % — ABNORMAL HIGH (ref 4.8–5.6)

## 2018-03-03 LAB — TSH: TSH: 1.41 u[IU]/mL (ref 0.450–4.500)

## 2018-03-10 ENCOUNTER — Other Ambulatory Visit: Payer: Self-pay

## 2018-03-10 ENCOUNTER — Ambulatory Visit (INDEPENDENT_AMBULATORY_CARE_PROVIDER_SITE_OTHER): Payer: 59 | Admitting: Physician Assistant

## 2018-03-10 ENCOUNTER — Encounter: Payer: Self-pay | Admitting: Physician Assistant

## 2018-03-10 ENCOUNTER — Encounter

## 2018-03-10 VITALS — BP 141/80 | HR 77 | Temp 98.6°F | Resp 18 | Ht 70.0 in | Wt 277.4 lb

## 2018-03-10 DIAGNOSIS — J209 Acute bronchitis, unspecified: Secondary | ICD-10-CM | POA: Diagnosis not present

## 2018-03-10 MED ORDER — DOXYCYCLINE HYCLATE 100 MG PO CAPS: 100.0000 mg | ORAL_CAPSULE | Freq: Two times a day (BID) | ORAL | 0 refills | Status: DC

## 2018-03-10 MED ORDER — IPRATROPIUM BROMIDE 0.03 % NA SOLN: 2.0000 | Freq: Two times a day (BID) | NASAL | 0 refills | Status: DC

## 2018-03-10 MED ORDER — BENZONATATE 100 MG PO CAPS: 100.0000 mg | ORAL_CAPSULE | Freq: Three times a day (TID) | ORAL | 0 refills | Status: DC | PRN

## 2018-03-10 MED ORDER — PREDNISONE 20 MG PO TABS: 40.0000 mg | ORAL_TABLET | Freq: Every day | ORAL | 0 refills | Status: AC

## 2018-03-10 NOTE — Progress Notes (Signed)
MRN: [id number] DOB: [birth year]  Subjective:   Maria Sharp is a 52 y.o. female presenting for chief complaint of Cough (started wednesday after getting flu shot, ears hurt and head feels really tight ) and Nasal Congestion .  Reports 6 day history of rhinorrhea, ear pain, dry cough (no hemoptysis) and wheezing. Wheezing is worse at night time.  Cough is keeping her up night.  Nasal mucous production has all been clear.  Has tried aleve, hauls cough drop,  proair with relief. Denies fever, shortness of breath, chest pain and myalgia, nausea, vomiting, abdominal pain and diarrhea. Has not had sick contact with anyone. Has history of severe seasonal allergies and mild asthma, no maintenance inhaler. Sees allergist next week.  Patient has had flu shot this season, right befroe this all started. Denies smoking. Denies any other aggravating or relieving factors, no other questions or concerns.  Maria Sharp has a current medication list which includes the following prescription(s): albuterol, blood glucose meter kit and supplies, chlorthalidone, epinephrine, linaclotide, metformin, montelukast, olmesartan, omeprazole, rosuvastatin, spironolactone, triamcinolone, vitamin b-12, vitamin e, and vitamin mixture. Also is allergic to ace inhibitors; shellfish allergy; januvia [sitagliptin]; latex; and lipitor [atorvastatin].  Maria Sharp  has a past medical history of Allergy, Asthma, Diabetes mellitus, Fatty liver, Gastritis, Gastroparesis, GERD (gastroesophageal reflux disease), High cholesterol, History of varicella, Hypertension, and IBS (irritable bowel syndrome). Also  has a past surgical history that includes Cholecystectomy; Wisdom tooth extraction; and Anal Rectal manometry (N/A, 04/03/2015).   Objective:   Vitals: BP (!) 141/80   Pulse 77   Temp 98.6 F (37 C) (Oral)   Resp 18   Ht _0  (1.778 m)   Wt 277 lb 6.4 oz (125.8 kg)   LMP 01/13/2018   SpO2 97%   BMI 39.80 kg/m   Physical Exam    Constitutional: She is oriented to person, place, and time. She appears well-developed and well-nourished. No distress.  HENT:  Head: Normocephalic and atraumatic.  Right Ear: Tympanic membrane, external ear and ear canal normal.  Left Ear: Tympanic membrane, external ear and ear canal normal.  Nose: Mucosal edema (severe on right, moderate on left) and rhinorrhea present. Right sinus exhibits maxillary sinus tenderness (mild). Right sinus exhibits no frontal sinus tenderness. Left sinus exhibits maxillary sinus tenderness (mild). Left sinus exhibits no frontal sinus tenderness.  Mouth/Throat: Uvula is midline and mucous membranes are normal. Posterior oropharyngeal erythema present. No posterior oropharyngeal edema or tonsillar abscesses. No tonsillar exudate.  Eyes: Conjunctivae are normal.  Neck: Normal range of motion.  Cardiovascular: Normal rate, regular rhythm, normal heart sounds and intact distal pulses.  Pulmonary/Chest: Effort normal. No accessory muscle usage. No tachypnea. No respiratory distress. She has no decreased breath sounds. She has wheezes (few scant wheezes with forced expiratory breathing). She has no rhonchi. She has no rales.  Lymphadenopathy:       Head (right side): No submental, no submandibular, no tonsillar, no preauricular, no posterior auricular and no occipital adenopathy present.       Head (left side): No submental, no submandibular, no tonsillar, no preauricular, no posterior auricular and no occipital adenopathy present.    She has no cervical adenopathy.       Right: No supraclavicular adenopathy present.       Left: No supraclavicular adenopathy present.  Neurological: She is alert and oriented to person, place, and time.  Skin: Skin is warm and dry.  Psychiatric: She has a normal mood and affect.  Vitals  reviewed.   No results found for this or any previous visit (from the past 24 hour(s)).  Assessment and Plan :  1. Acute bronchitis, unspecified  organism - Likely viral in etiology d/t reassuring physical exam findings. She is afebrile. Few scant wheezes noted with forced expiration, but normal lung sounds with normal breathing. Concern for early developing asthma exacerbation. Rec prednisone x 5 days. Do not think she would benefit from abx at this time. However, does have some sinus pressure/congestion right now. If no improvement after 5 days, given printed Rx for doxy. Rec continuing with allergy meds and sx tx at this time. If any sx worsen, return.  - Advised supportive care, offered symptomatic relief. - predniSONE (DELTASONE) 20 MG tablet; Take 2 tablets (40 mg total) by mouth daily with breakfast for 5 days.  Dispense: 10 tablet; Refill: 0 - ipratropium (ATROVENT) 0.03 % nasal spray; Place 2 sprays into both nostrils 2 (two) times daily.  Dispense: 30 mL; Refill: 0 - benzonatate (TESSALON) 100 MG capsule; Take 1-2 capsules (100-200 mg total) by mouth 3 (three) times daily as needed for cough.  Dispense: 40 capsule; Refill: 0 - doxycycline (VIBRAMYCIN) 100 MG capsule; Take 1 capsule (100 mg total) by mouth 2 (two) times daily.  Dispense: 20 capsule; Refill: 0   Side effects, risks, benefits, and alternatives of the medications and treatment plan prescribed today were discussed, and patient expressed understanding of the instructions given. No barriers to understanding were identified. Red flags discussed in detail. Pt expressed understanding regarding what to do in case of emergency/urgent symptoms.  Tenna Delaine, PA-C  [facility] 03/10/2018 12:22 PM

## 2018-03-10 NOTE — Patient Instructions (Addendum)
- We will treat this as a respiratory viral infection. We are going to treat your underlying inflammation with oral prednisone. Prednisone is a steroid and can cause side effects such as headache, irritability, nausea, vomiting, increased heart rate, increased blood pressure, increased blood sugar, appetite changes, and insomnia. Please take tablets in the morning with a full meal to help decrease the chances of these side effects.  -Use proair every 4-6 hours as needed for wheezing.  -Use tessalon perles to stop cough. -Continue allergy meds, may use atrovent nasal spray for runny nose.  -If no improvement in 5 days, fill doxycycline. If you take this and do not feel better, come back. If you are ever start to feel worse, come back.   - tea recipe for cough: boil water, add 2 inches shaved ginger root, steep 15 minutes, add juice from 2 full lemons, and 2 tbsp honey.   While taking Doxycycline:  -Do not drink milk or take iron supplements, multivitamins, calcium supplements, antacids, laxatives within 2 hours before or after taking doxycycline. -Avoid direct exposure to sunlight or tanning beds. Doxycycline can make you sunburn more easily. Wear protective clothing and use sunscreen (SPF 30 or higher) when you are outdoors. -Antibiotic medicines can cause diarrhea, which may be a sign of a new infection. If you have diarrhea that is watery or bloody, stop taking this medicine and seek medical care.        If you have lab work done today you will be contacted with your lab results within the next 2 weeks.  If you have not heard from [place] then please contact us. The fastest way to get your results is to register for My Chart.  Acute Bronchitis, Adult Acute bronchitis is when air tubes (bronchi) in the lungs suddenly get swollen. The condition can make it hard to breathe. It can also cause these symptoms:  A cough.  Coughing up clear, yellow, or green mucus.  Wheezing.  Chest  congestion.  Shortness of breath.  A fever.  Body aches.  Chills.  A sore throat.  Follow these instructions at home: Medicines  Take over-the-counter and prescription medicines only as told by your doctor.  If you were prescribed an antibiotic medicine, take it as told by your doctor. Do not stop taking the antibiotic even if you start to feel better. General instructions  Rest.  Drink enough fluids to keep your pee (urine) clear or pale yellow.  Avoid smoking and secondhand smoke. If you smoke and you need help quitting, ask your doctor. Quitting will help your lungs heal faster.  Use an inhaler, cool mist vaporizer, or humidifier as told by your doctor.  Keep all follow-up visits as told by your doctor. This is important. How is this prevented? To lower your risk of getting this condition again:  Wash your hands often with soap and water. If you cannot use soap and water, use hand sanitizer.  Avoid contact with people who have cold symptoms.  Try not to touch your hands to your mouth, nose, or eyes.  Make sure to get the flu shot every year.  Contact a doctor if:  Your symptoms do not get better in 2 weeks. Get help right away if:  You cough up blood.  You have chest pain.  You have very bad shortness of breath.  You become dehydrated.  You faint (pass out) or keep feeling like you are going to pass out.  You keep throwing up (vomiting).  You have a very bad headache.  Your fever or chills gets worse. This information is not intended to replace advice given to you by your health care provider. Make sure you discuss any questions you have with your health care provider. Document Released: 09/25/2007 Document Revised: 11/15/2015 Document Reviewed: 09/27/2015 Elsevier Interactive Patient Education  2018 [employer].   IF you received an x-ray today, you will receive an invoice from [facility]. Please contact [facility] at  [phone] with questions or concerns regarding your invoice.   IF you received labwork today, you will receive an invoice from [facility]. Please contact [facility] at [phone] with questions or concerns regarding your invoice.   Our billing staff will not be able to assist you with questions regarding bills from these companies.  You will be contacted with the lab results as soon as they are available. The fastest way to get your results is to activate your My Chart account. Instructions are located on the last page of this paperwork. If you have not heard from [place] regarding the results in 2 weeks, please contact this office.

## 2018-03-12 ENCOUNTER — Other Ambulatory Visit: Payer: Self-pay | Admitting: Physician Assistant

## 2018-03-18 ENCOUNTER — Encounter: Payer: Self-pay | Admitting: Gastroenterology

## 2018-03-21 ENCOUNTER — Other Ambulatory Visit: Payer: Self-pay | Admitting: Family Medicine

## 2018-03-23 NOTE — Telephone Encounter (Addendum)
Called [facility] # [phone], regarding clarification on which prescription of  Metformin the patient has there, the 500 mg tab or the 1000 mg tab. They stated that the patient picked up the 500 mg tab on 03/20/18. Per pharmacy the prescription that she picked up is written for her to take 1 tab a day (500 mg) once daily with food.   Attempted to contact the patient, no answer. Left message for to call the office regarding how she is taking her metformin.  Pt returned call and she is taking 4 of the 500 mg tabs a day and would like to get the 1000 mg tab. Pt is requesting a new prescription sent to [facility].   She is also requesting a call regarding her lab work.

## 2018-03-24 ENCOUNTER — Encounter: Payer: 59 | Admitting: Gastroenterology

## 2018-03-24 ENCOUNTER — Ambulatory Visit: Payer: 59 | Admitting: Podiatry

## 2018-03-24 DIAGNOSIS — E1151 Type 2 diabetes mellitus with diabetic peripheral angiopathy without gangrene: Secondary | ICD-10-CM | POA: Diagnosis not present

## 2018-03-24 DIAGNOSIS — M79609 Pain in unspecified limb: Secondary | ICD-10-CM | POA: Diagnosis not present

## 2018-03-24 DIAGNOSIS — B351 Tinea unguium: Secondary | ICD-10-CM

## 2018-03-24 NOTE — Patient Instructions (Signed)
Diabetes and Foot Care Diabetes may cause you to have problems because of poor blood supply (circulation) to your feet and legs. This may cause the skin on your feet to become thinner, break easier, and heal more slowly. Your skin may become dry, and the skin may peel and crack. You may also have nerve damage in your legs and feet causing decreased feeling in them. You may not notice minor injuries to your feet that could lead to infections or more serious problems. Taking care of your feet is one of the most important things you can do for yourself. Follow these instructions at home:  Wear shoes at all times, even in the house. Do not go barefoot. Bare feet are easily injured.  Check your feet daily for blisters, cuts, and redness. If you cannot see the bottom of your feet, use a mirror or ask someone for help.  Wash your feet with warm water (do not use hot water) and mild soap. Then pat your feet and the areas between your toes until they are completely dry. Do not soak your feet as this can dry your skin.  Apply a moisturizing lotion or petroleum jelly (that does not contain alcohol and is unscented) to the skin on your feet and to dry, brittle toenails. Do not apply lotion between your toes.  Trim your toenails straight across. Do not dig under them or around the cuticle. File the edges of your nails with an emery board or nail file.  Do not cut corns or calluses or try to remove them with medicine.  Wear clean socks or stockings every day. Make sure they are not too tight. Do not wear knee-high stockings since they may decrease blood flow to your legs.  Wear shoes that fit properly and have enough cushioning. To break in new shoes, wear them for just a few hours a day. This prevents you from injuring your feet. Always look in your shoes before you put them on to be sure there are no objects inside.  Do not cross your legs. This may decrease the blood flow to your feet.  If you find a  minor scrape, cut, or break in the skin on your feet, keep it and the skin around it clean and dry. These areas may be cleansed with mild soap and water. Do not cleanse the area with peroxide, alcohol, or iodine.  When you remove an adhesive bandage, be sure not to damage the skin around it.  If you have a wound, look at it several times a day to make sure it is healing.  Do not use heating pads or hot water bottles. They may burn your skin. If you have lost feeling in your feet or legs, you may not know it is happening until it is too late.  Make sure your health care provider performs a complete foot exam at least annually or more often if you have foot problems. Report any cuts, sores, or bruises to your health care provider immediately. Contact a health care provider if:  You have an injury that is not healing.  You have cuts or breaks in the skin.  You have an ingrown nail.  You notice redness on your legs or feet.  You feel burning or tingling in your legs or feet.  You have pain or cramps in your legs and feet.  Your legs or feet are numb.  Your feet always feel cold. Get help right away if:  There is increasing   redness, swelling, or pain in or around a wound.  There is a red line that goes up your leg.  Pus is coming from a wound.  You develop a fever or as directed by your health care provider.  You notice a bad smell coming from an ulcer or wound. This information is not intended to replace advice given to you by your health care provider. Make sure you discuss any questions you have with your health care provider. Document Released: 04/05/2000 Document Revised: 09/14/2015 Document Reviewed: 09/15/2012 Elsevier Interactive Patient Education  2017 [employer] Onychomycosis/Fungal Toenails  WHAT IS IT? An infection that lies within the keratin of your nail plate that is caused by a fungus.  WHY ME? Fungal infections affect all ages, sexes, races, and creeds.   There may be many factors that predispose you to a fungal infection such as age, coexisting medical conditions such as diabetes, or an autoimmune disease; stress, medications, fatigue, genetics, etc.  Bottom line: fungus thrives in a warm, moist environment and your shoes offer such a location.  IS IT CONTAGIOUS? Theoretically, yes.  You do not want to share shoes, nail clippers or files with someone who has fungal toenails.  Walking around barefoot in the same room or sleeping in the same bed is unlikely to transfer the organism.  It is important to realize, however, that fungus can spread easily from one nail to the next on the same foot.  HOW DO WE TREAT THIS?  There are several ways to treat this condition.  Treatment may depend on many factors such as age, medications, pregnancy, liver and kidney conditions, etc.  It is best to ask your doctor which options are available to you.  1. No treatment.   Unlike many other medical concerns, you can live with this condition.  However for many people this can be a painful condition and may lead to ingrown toenails or a bacterial infection.  It is recommended that you keep the nails cut short to help reduce the amount of fungal nail. 2. Topical treatment.  These range from herbal remedies to prescription strength nail lacquers.  About 40-50% effective, topicals require twice daily application for approximately 9 to 12 months or until an entirely new nail has grown out.  The most effective topicals are medical grade medications available through physicians offices. 3. Oral antifungal medications.  With an 80-90% cure rate, the most common oral medication requires 3 to 4 months of therapy and stays in your system for a year as the new nail grows out.  Oral antifungal medications do require blood work to make sure it is a safe drug for you.  A liver function panel will be performed prior to starting the medication and after the first month of treatment.  It is  important to have the blood work performed to avoid any harmful side effects.  In general, this medication safe but blood work is required. 4. Laser Therapy.  This treatment is performed by applying a specialized laser to the affected nail plate.  This therapy is noninvasive, fast, and non-painful.  It is not covered by insurance and is therefore, out of pocket.  The results have been very good with a 80-95% cure rate.  [facility] is the only practice in the area to offer this therapy. 5. Permanent Nail Avulsion.  Removing the entire nail so that a new nail will not grow back. 

## 2018-03-26 ENCOUNTER — Ambulatory Visit (AMBULATORY_SURGERY_CENTER): Payer: 59 | Admitting: Gastroenterology

## 2018-03-26 ENCOUNTER — Encounter: Payer: Self-pay | Admitting: Gastroenterology

## 2018-03-26 VITALS — BP 118/78 | HR 71 | Temp 96.6°F | Resp 16 | Ht 70.0 in | Wt 277.0 lb

## 2018-03-26 DIAGNOSIS — K635 Polyp of colon: Secondary | ICD-10-CM

## 2018-03-26 DIAGNOSIS — Z1211 Encounter for screening for malignant neoplasm of colon: Secondary | ICD-10-CM | POA: Diagnosis present

## 2018-03-26 DIAGNOSIS — D123 Benign neoplasm of transverse colon: Secondary | ICD-10-CM

## 2018-03-26 MED ORDER — SODIUM CHLORIDE 0.9 % IV SOLN: 500.0000 mL | Freq: Once | INTRAVENOUS | Status: DC

## 2018-03-26 NOTE — Progress Notes (Signed)
Called to room to assist during endoscopic procedure.  Patient ID and intended procedure confirmed with present staff. Received instructions for my participation in the procedure from the performing physician.  

## 2018-03-26 NOTE — Progress Notes (Signed)
Patient states has no changes in health history since pre visit.

## 2018-03-26 NOTE — Op Note (Signed)
[facility] Patient Name: Maria Sharp Procedure Date: 03/26/2018 11:16 AM MRN: [id number] Endoscopist: Mauri Pole , MD Age: 52 Referring MD:  Date of Birth: 02-Mar-1966 Gender: Female Account #: [id number] Procedure:                Colonoscopy Indications:              Screening for colorectal malignant neoplasm Medicines:                Monitored Anesthesia Care Procedure:                Pre-Anesthesia Assessment:                           - Prior to the procedure, a History and Physical                            was performed, and patient medications and                            allergies were reviewed. The patient's tolerance of                            previous anesthesia was also reviewed. The risks                            and benefits of the procedure and the sedation                            options and risks were discussed with the patient.                            All questions were answered, and informed consent                            was obtained. Prior Anticoagulants: The patient has                            taken no previous anticoagulant or antiplatelet                            agents. ASA Grade Assessment: II - A patient with                            mild systemic disease. After reviewing the risks                            and benefits, the patient was deemed in                            satisfactory condition to undergo the procedure.                           After obtaining informed consent, the colonoscope  was passed under direct vision. Throughout the                            procedure, the patient's blood pressure, pulse, and                            oxygen saturations were monitored continuously. The                            Colonoscope was introduced through the anus and                            advanced to the the cecum, identified by                            appendiceal orifice  and ileocecal valve. The                            colonoscopy was performed without difficulty. The                            patient tolerated the procedure well. The quality                            of the bowel preparation was excellent. The                            ileocecal valve, appendiceal orifice, and rectum                            were photographed. Scope In: 11:29:09 AM Scope Out: 11:45:12 AM Scope Withdrawal Time: 0 hours 11 minutes 18 seconds  Total Procedure Duration: 0 hours 16 minutes 3 seconds  Findings:                 The perianal and digital rectal examinations were                            normal.                           A 2 mm polyp was found in the transverse colon. The                            polyp was sessile. The polyp was removed with a                            cold biopsy forceps. Resection and retrieval were                            complete.                           A 4 mm polyp was found in the transverse colon. The  polyp was sessile. The polyp was removed with a                            cold snare. Resection and retrieval were complete.                           Non-bleeding internal hemorrhoids were found during                            retroflexion. The hemorrhoids were small. Complications:            No immediate complications. Estimated Blood Loss:     Estimated blood loss was minimal. Impression:               - One 2 mm polyp in the transverse colon, removed                            with a cold biopsy forceps. Resected and retrieved.                           - One 4 mm polyp in the transverse colon, removed                            with a cold snare. Resected and retrieved.                           - Non-bleeding internal hemorrhoids. Recommendation:           - Patient has a contact number available for                            emergencies. The signs and symptoms of potential                             delayed complications were discussed with the                            patient. Return to normal activities tomorrow.                            Written discharge instructions were provided to the                            patient.                           - Resume previous diet.                           - Continue present medications.                           - Await pathology results.                           - Repeat colonoscopy in 5-10 years for surveillance  based on pathology results. Mauri Pole, MD 03/26/2018 11:49:08 AM This report has been signed electronically.

## 2018-03-26 NOTE — Patient Instructions (Signed)
Handouts given on hemorrhoids and polyps.   YOU HAD AN ENDOSCOPIC PROCEDURE TODAY AT [facility]:   Refer to the procedure report that was given to you for any specific questions about what was found during the examination.  If the procedure report does not answer your questions, please call your gastroenterologist to clarify.  If you requested that your care partner not be given the details of your procedure findings, then the procedure report has been included in a sealed envelope for you to review at your convenience later.  YOU SHOULD EXPECT: Some feelings of bloating in the abdomen. Passage of more gas than usual.  Walking can help get rid of the air that was put into your GI tract during the procedure and reduce the bloating. If you had a lower endoscopy (such as a colonoscopy or flexible sigmoidoscopy) you may notice spotting of blood in your stool or on the toilet paper. If you underwent a bowel prep for your procedure, you may not have a normal bowel movement for a few days.  Please Note:  You might notice some irritation and congestion in your nose or some drainage.  This is from the oxygen used during your procedure.  There is no need for concern and it should clear up in a day or so.  SYMPTOMS TO REPORT IMMEDIATELY:   Following lower endoscopy (colonoscopy or flexible sigmoidoscopy):  Excessive amounts of blood in the stool  Significant tenderness or worsening of abdominal pains  Swelling of the abdomen that is new, acute  Fever of 100F or higher   For urgent or emergent issues, a gastroenterologist can be reached at any hour by calling [phone].   DIET:  We do recommend a small meal at first, but then you may proceed to your regular diet.  Drink plenty of fluids but you should avoid alcoholic beverages for 24 hours.  ACTIVITY:  You should plan to take it easy for the rest of today and you should NOT DRIVE or use heavy machinery until tomorrow (because of  the sedation medicines used during the test).    FOLLOW UP: Our staff will call the number listed on your records the next business day following your procedure to check on you and address any questions or concerns that you may have regarding the information given to you following your procedure. If we do not reach you, we will leave a message.  However, if you are feeling well and you are not experiencing any problems, there is no need to return our call.  We will assume that you have returned to your regular daily activities without incident.  If any biopsies were taken you will be contacted by phone or by letter within the next 1-3 weeks.  Please call us at [phone] if you have not heard about the biopsies in 3 weeks.    SIGNATURES/CONFIDENTIALITY: You and/or your care partner have signed paperwork which will be entered into your electronic medical record.  These signatures attest to the fact that that the information above on your After Visit Summary has been reviewed and is understood.  Full responsibility of the confidentiality of this discharge information lies with you and/or your care-partner.

## 2018-03-26 NOTE — Progress Notes (Signed)
Report given to PACU, vss 

## 2018-03-27 ENCOUNTER — Telehealth: Payer: Self-pay

## 2018-03-27 NOTE — Telephone Encounter (Signed)
Attempted to reach patient for post-procedure f/u call. No answer. Left message that we will make another attempt to reach him again later today and to please not hesitate to call us if she has any questions/concerns regarding her care.

## 2018-04-04 ENCOUNTER — Telehealth: Payer: Self-pay | Admitting: Family Medicine

## 2018-04-04 MED ORDER — METFORMIN HCL ER 500 MG PO TB24: ORAL_TABLET | ORAL | 3 refills | Status: DC

## 2018-04-04 NOTE — Telephone Encounter (Signed)
sev notes from [facility] note metformin ER Osmotic is not covered = does cover metformin ext-rel (except generic Fortamet (metformin OSM) or Glumetza (metformin MOD)). D/c'd the 1000mg  OSM metformin (generic Fortamet) 2 tabs/d that had been sent in and changed back to generic GLUCOPHAGE XR = metformin XR 500 - take 4 tabs/d

## 2018-04-05 ENCOUNTER — Encounter: Payer: Self-pay | Admitting: Podiatry

## 2018-04-05 NOTE — Progress Notes (Signed)
Subjective: Maria Sharp presents today with painful, thick toenails 1-5 b/l that she cannot cut and which interfere with daily activities.  Pain is aggravated when wearing enclosed shoe gear.  Shawnee Knapp, MD is her PCP and last visit was 03/02/2018.   Current Outpatient Medications:  .  albuterol (PROVENTIL HFA;VENTOLIN HFA) 108 (90 Base) MCG/ACT inhaler, INHALE 2 PUFFS Q 4 TO 6 H PRN, Disp: , Rfl: 0 .  ARNUITY ELLIPTA 100 MCG/ACT AEPB, INHALE BY MOUTH ONCE PER DAY, Disp: , Rfl: 6 .  azelastine (ASTELIN) 0.1 % nasal spray, U 1 TO 2 SPRAYS IEN BID, Disp: , Rfl: 6 .  benzonatate (TESSALON) 100 MG capsule, Take 1-2 capsules (100-200 mg total) by mouth 3 (three) times daily as needed for cough., Disp: 40 capsule, Rfl: 0 .  blood glucose meter kit and supplies KIT, Dispense based on patient and insurance preference. Use up to four times daily as directed. (FOR ICD-9 250.00, 250.01)., Disp: 1 each, Rfl: 0 .  chlorthalidone (HYGROTON) 25 MG tablet, Take 1 tablet (25 mg total) by mouth daily., Disp: 90 tablet, Rfl: 1 .  doxycycline (VIBRAMYCIN) 100 MG capsule, Take 1 capsule (100 mg total) by mouth 2 (two) times daily. (Patient not taking: Reported on 03/26/2018), Disp: 20 capsule, Rfl: 0 .  EPINEPHrine (EPI-PEN) 0.3 mg/0.3 mL DEVI, Inject 0.3 mg into the muscle once as needed. For severe allergic reaction, Disp: , Rfl:  .  ergocalciferol (VITAMIN D2) 1.25 MG (50000 UT) capsule, Vitamin D2 1,250 mcg (50,000 unit) capsule, Disp: , Rfl:  .  ipratropium (ATROVENT) 0.03 % nasal spray, Place 2 sprays into both nostrils 2 (two) times daily., Disp: 30 mL, Rfl: 0 .  levocetirizine (XYZAL) 5 MG tablet, TK 1 T PO QD IN THE EVE, Disp: , Rfl: 6 .  linaclotide (LINZESS) 72 MCG capsule, Take 1 capsule (72 mcg total) by mouth daily before breakfast. (Patient taking differently: Take 72 mcg by mouth daily before breakfast. Prn constipation), Disp: 90 capsule, Rfl: 3 .  linaclotide (LINZESS) 72 MCG capsule, Take 1  capsule (72 mcg total) by mouth daily before breakfast., Disp: 30 capsule, Rfl: 3 .  montelukast (SINGULAIR) 10 MG tablet, Take 10 mg by mouth at bedtime. , Disp: , Rfl:  .  olmesartan (BENICAR) 40 MG tablet, Take 1 tablet (40 mg total) by mouth daily., Disp: 90 tablet, Rfl: 3 .  omeprazole (PRILOSEC) 40 MG capsule, take 1 capsule by mouth once daily, Disp: 90 capsule, Rfl: 3 .  rosuvastatin (CRESTOR) 5 MG tablet, Take 0.5 tablets (2.5 mg total) by mouth every other day., Disp: 45 tablet, Rfl: 1 .  spironolactone (ALDACTONE) 25 MG tablet, Take 1 tablet (25 mg total) by mouth 2 (two) times daily., Disp: 180 tablet, Rfl: 1 .  SUPREP BOWEL PREP KIT 17.5-3.13-1.6 GM/177ML SOLN, TAKE ONCE AS DIRECTED FOR 1 DOSE, Disp: , Rfl: 0 .  triamcinolone (NASACORT) 55 MCG/ACT AERO nasal inhaler, Place 2 sprays into the nose daily., Disp: 1 Inhaler, Rfl: 12 .  vitamin B-12 (CYANOCOBALAMIN) 1000 MCG tablet, Take 1,000 mcg by mouth daily., Disp: , Rfl:  .  vitamin E 400 UNIT capsule, Take 400 Units by mouth daily., Disp: , Rfl:  .  Vitamin Mixture (VITAMIN E COMPLETE PO), Take by mouth., Disp: , Rfl:  .  metFORMIN (GLUCOPHAGE-XR) 500 MG 24 hr tablet, TAKE 4 TABLETS BY MOUTH ONCE DAILY WITH FOOD, Disp: 360 tablet, Rfl: 3  Allergies  Allergen Reactions  . Ace Inhibitors Anaphylaxis  .  Shellfish Allergy Anaphylaxis and Other (See Comments)    Only shrimp  . Januvia [Sitagliptin] Other (See Comments)    Recurrent abdominal pain with elevated pancreas enzymes  . Latex Itching  . Lipitor [Atorvastatin]     Objective:  Vascular Examination: Capillary refill time < 3 seconds x 10 digits Dorsalis pedis 2/4 b/l Posterior tibial pulses palpable 1/4  b/l Digital hair present x 10 digits Skin temperature gradient WNL b/l +varicosities b/l LE  Dermatological Examination: Skin with normal turgor, texture and tone b/l  Toenails 1-5 b/l discolored, thick, dystrophic with subungual debris and pain with palpation to  nailbeds due to thickness of nails.  No open wounds.  No interdigital macerations.  Musculoskeletal: Muscle strength 5/5 to all LE muscle groups  Neurological: Sensation intact with 10 gram monofilament. Vibratory sensation slightly diminished b/l.   Assessment: Painful onychomycosis toenails 1-5 b/l   Plan: 1. Toenails 1-5 b/l were debrided in length and girth without iatrogenic bleeding. 2. Patient to continue soft, supportive shoe gear 3. Patient to report any pedal injuries to medical professional immediately. 4. Follow up 9weeks. 5.  Patient/POA to call should there be a concern in the interim.

## 2018-04-06 ENCOUNTER — Encounter: Payer: Self-pay | Admitting: Gastroenterology

## 2018-04-06 ENCOUNTER — Other Ambulatory Visit: Payer: Self-pay | Admitting: Physician Assistant

## 2018-04-06 DIAGNOSIS — J209 Acute bronchitis, unspecified: Secondary | ICD-10-CM

## 2018-05-25 ENCOUNTER — Ambulatory Visit: Payer: 59 | Admitting: Podiatry

## 2018-05-25 ENCOUNTER — Encounter: Payer: Self-pay | Admitting: Sports Medicine

## 2018-05-25 ENCOUNTER — Ambulatory Visit: Payer: 59 | Admitting: Sports Medicine

## 2018-05-25 DIAGNOSIS — B351 Tinea unguium: Secondary | ICD-10-CM

## 2018-05-25 DIAGNOSIS — M79609 Pain in unspecified limb: Principal | ICD-10-CM

## 2018-05-25 DIAGNOSIS — E1151 Type 2 diabetes mellitus with diabetic peripheral angiopathy without gangrene: Secondary | ICD-10-CM

## 2018-05-25 DIAGNOSIS — M79676 Pain in unspecified toe(s): Secondary | ICD-10-CM | POA: Diagnosis not present

## 2018-05-25 NOTE — Progress Notes (Signed)
Subjective: Maria Sharp is a 53 y.o. female patient with history of diabetes who presents to office today complaining of long,mildly painful nails  while ambulating in shoes; unable to trim. Patient states that the glucose reading this morning was not recorded and last A1c 7, elevated because of fasting for colonoscopy. Patient denies any new changes in medication or new problems.  No other issues noted.  Patient Active Problem List   Diagnosis Date Noted  . Migraines 09/02/2017  . DJD (degenerative joint disease) 09/02/2017  . Vertigo 12/05/2016  . Bilateral temporomandibular joint pain 12/05/2016  . Constipation   . Asthma, chronic 09/28/2014  . Gastroparesis   . Varicose veins 09/14/2013  . Neck pain on left side 06/14/2013  . Current non-adherence to medical treatment 06/14/2013  . Vitamin D deficiency 10/05/2012  . Leg cramps 10/05/2012  . Family history of diabetes mellitus 10/05/2012  . Type 2 diabetes mellitus, uncontrolled ([place]) 05/28/2012  . 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 ABDOMINAL PAIN, UNSPECIFIED SITE 05/18/2010  . CHEST PAIN, INTERMITTENT 05/12/2009  . Obesity 04/05/2009  . CARDIOMEGALY 04/05/2009  . PALPITATIONS 04/05/2009  . Type 2 diabetes mellitus with peripheral circulatory disorder ([place]) 03/15/2009  . BACK PAIN 03/09/2008  . Hyperlipidemia LDL goal <100 03/03/2008  . Essential hypertension 03/03/2008  . GERD 03/03/2008  . IRRITABLE BOWEL SYNDROME 03/03/2008  . RECTAL BLEEDING 03/03/2008   Current Outpatient Medications on File Prior to Visit  Medication Sig Dispense Refill  . albuterol (PROVENTIL HFA;VENTOLIN HFA) 108 (90 Base) MCG/ACT inhaler INHALE 2 PUFFS Q 4 TO 6 H PRN  0  . ARNUITY ELLIPTA 100 MCG/ACT AEPB INHALE BY MOUTH ONCE PER DAY  6  . azelastine (ASTELIN) 0.1 % nasal spray U 1 TO 2 SPRAYS IEN BID  6  . benzonatate (TESSALON) 100 MG capsule Take 1-2 capsules (100-200 mg total) by mouth 3 (three) times daily as needed for cough. 40  capsule 0  . blood glucose meter kit and supplies KIT Dispense based on patient and insurance preference. Use up to four times daily as directed. (FOR ICD-9 250.00, 250.01). 1 each 0  . chlorthalidone (HYGROTON) 25 MG tablet Take 1 tablet (25 mg total) by mouth daily. 90 tablet 1  . doxycycline (VIBRAMYCIN) 100 MG capsule Take 1 capsule (100 mg total) by mouth 2 (two) times daily. (Patient not taking: Reported on 03/26/2018) 20 capsule 0  . EPINEPHrine (EPI-PEN) 0.3 mg/0.3 mL DEVI Inject 0.3 mg into the muscle once as needed. For severe allergic reaction    . ergocalciferol (VITAMIN D2) 1.25 MG (50000 UT) capsule Vitamin D2 1,250 mcg (50,000 unit) capsule    . ipratropium (ATROVENT) 0.03 % nasal spray USE 2 SPRAYS IN EACH NOSTRIL TWICE DAILY 30 mL 2  . levocetirizine (XYZAL) 5 MG tablet TK 1 T PO QD IN THE EVE  6  . linaclotide (LINZESS) 72 MCG capsule Take 1 capsule (72 mcg total) by mouth daily before breakfast. (Patient taking differently: Take 72 mcg by mouth daily before breakfast. Prn constipation) 90 capsule 3  . linaclotide (LINZESS) 72 MCG capsule Take 1 capsule (72 mcg total) by mouth daily before breakfast. 30 capsule 3  . metFORMIN (GLUCOPHAGE-XR) 500 MG 24 hr tablet TAKE 4 TABLETS BY MOUTH ONCE DAILY WITH FOOD 360 tablet 3  . montelukast (SINGULAIR) 10 MG tablet Take 10 mg by mouth at bedtime.     Marland Kitchen olmesartan (BENICAR) 40 MG tablet Take 1 tablet (40 mg total) by mouth daily. Warm [place]  tablet 3  . omeprazole (PRILOSEC) 40 MG capsule take 1 capsule by mouth once daily 90 capsule 3  . rosuvastatin (CRESTOR) 5 MG tablet Take 0.5 tablets (2.5 mg total) by mouth every other day. 45 tablet 1  . spironolactone (ALDACTONE) 25 MG tablet Take 1 tablet (25 mg total) by mouth 2 (two) times daily. 180 tablet 1  . SUPREP BOWEL PREP KIT 17.5-3.13-1.6 GM/177ML SOLN TAKE ONCE AS DIRECTED FOR 1 DOSE  0  . triamcinolone (NASACORT) 55 MCG/ACT AERO nasal inhaler Place 2 sprays into the nose daily. 1 Inhaler 12  .  vitamin B-12 (CYANOCOBALAMIN) 1000 MCG tablet Take 1,000 mcg by mouth daily.    . vitamin E 400 UNIT capsule Take 400 Units by mouth daily.    . Vitamin Mixture (VITAMIN E COMPLETE PO) Take by mouth.     No current facility-administered medications on file prior to visit.    Allergies  Allergen Reactions  . Ace Inhibitors Anaphylaxis  . Shellfish Allergy Anaphylaxis and Other (See Comments)    Only shrimp  . Januvia [Sitagliptin] Other (See Comments)    Recurrent abdominal pain with elevated pancreas enzymes  . Latex Itching  . Lipitor [Atorvastatin]     Recent Results (from the past 2160 hour(s))  POCT urinalysis dipstick     Status: Abnormal   Collection Time: 03/02/18 10:48 AM  Result Value Ref Range   Color, UA yellow yellow   Clarity, UA clear clear   Glucose, UA negative negative mg/dL   Bilirubin, UA negative negative   Ketones, POC UA negative negative mg/dL   Spec Grav, UA 1.020 1.010 - 1.025   Blood, UA negative negative   pH, UA 6.0 5.0 - 8.0   Protein Ur, POC negative negative mg/dL   Urobilinogen, UA 0.2 0.2 or 1.0 E.U./dL   Nitrite, UA Negative Negative   Leukocytes, UA Trace (A) Negative  CBC with Differential/Platelet     Status: None   Collection Time: 03/02/18 11:50 AM  Result Value Ref Range   WBC 7.2 3.4 - 10.8 x10E3/uL   RBC 4.51 3.77 - 5.28 x10E6/uL   Hemoglobin 12.0 11.1 - 15.9 g/dL   Hematocrit 36.1 34.0 - 46.6 %   MCV 80 79 - 97 fL   MCH 26.6 26.6 - 33.0 pg   MCHC 33.2 31.5 - 35.7 g/dL   RDW 15.2 12.3 - 15.4 %   Platelets 354 150 - 450 x10E3/uL   Neutrophils 55 Not Estab. %   Lymphs 34 Not Estab. %   Monocytes 10 Not Estab. %   Eos 1 Not Estab. %   Basos 0 Not Estab. %   Neutrophils Absolute 4.0 1.4 - 7.0 x10E3/uL   Lymphocytes Absolute 2.5 0.7 - 3.1 x10E3/uL   Monocytes Absolute 0.7 0.1 - 0.9 x10E3/uL   EOS (ABSOLUTE) 0.1 0.0 - 0.4 x10E3/uL   Basophils Absolute 0.0 0.0 - 0.2 x10E3/uL   Immature Granulocytes 0 Not Estab. %   Immature  Grans (Abs) 0.0 0.0 - 0.1 x10E3/uL  Comprehensive metabolic panel     Status: Abnormal   Collection Time: 03/02/18 11:50 AM  Result Value Ref Range   Glucose 123 (H) 65 - 99 mg/dL   BUN 13 6 - 24 mg/dL   Creatinine, Ser 0.70 0.57 - 1.00 mg/dL   GFR calc non Af Amer 101 >59 mL/min/1.73   GFR calc Af Amer 116 >59 mL/min/1.73   BUN/Creatinine Ratio 19 9 - 23   Sodium 140 134 - 144  mmol/L   Potassium 4.0 3.5 - 5.2 mmol/L   Chloride 97 96 - 106 mmol/L   CO2 26 20 - 29 mmol/L   Calcium 10.0 8.7 - 10.2 mg/dL   Total Protein 7.3 6.0 - 8.5 g/dL   Albumin 4.2 3.5 - 5.5 g/dL   Globulin, Total 3.1 1.5 - 4.5 g/dL   Albumin/Globulin Ratio 1.4 1.2 - 2.2   Bilirubin Total 0.3 0.0 - 1.2 mg/dL   Alkaline Phosphatase 59 39 - 117 IU/L   AST 12 0 - 40 IU/L   ALT 17 0 - 32 IU/L  Lipid panel     Status: Abnormal   Collection Time: 03/02/18 11:50 AM  Result Value Ref Range   Cholesterol, Total 203 (H) 100 - 199 mg/dL   Triglycerides 146 0 - 149 mg/dL   HDL 52 >39 mg/dL   VLDL Cholesterol Cal 29 5 - 40 mg/dL   LDL Calculated 122 (H) 0 - 99 mg/dL   Chol/HDL Ratio 3.9 0.0 - 4.4 ratio    Comment:                                   T. Chol/HDL Ratio                                             Men  Women                               1/2 Avg.Risk  3.4    3.3                                   Avg.Risk  5.0    4.4                                2X Avg.Risk  9.6    7.1                                3X Avg.Risk 23.4   11.0   TSH     Status: None   Collection Time: 03/02/18 11:50 AM  Result Value Ref Range   TSH 1.410 0.450 - 4.500 uIU/mL  Hemoglobin A1c     Status: Abnormal   Collection Time: 03/02/18 11:50 AM  Result Value Ref Range   Hgb A1c MFr Bld 7.0 (H) 4.8 - 5.6 %    Comment:          Prediabetes: 5.7 - 6.4          Diabetes: >6.4          Glycemic control for adults with diabetes: <7.0    Est. average glucose Bld gHb Est-mCnc 154 mg/dL  VITAMIN D 25 Hydroxy (Vit-D Deficiency, Fractures)      Status: Abnormal   Collection Time: 03/02/18 11:50 AM  Result Value Ref Range   Vit D, 25-Hydroxy 29.6 (L) 30.0 - 100.0 ng/mL    Comment: Vitamin D deficiency has been defined by the [place] and an Endocrine Society practice guideline as a level of serum 25-OH  vitamin D less than 20 ng/mL (1,2). The Endocrine Society went on to further define vitamin D insufficiency as a level between 21 and 29 ng/mL (2). 1. IOM (Institute of Medicine). 2010. Dietary reference    intakes for calcium and D. Early: The    [employer]. 2. Holick MF, [place], Bischoff-Ferrari HA, et al.    Evaluation, treatment, and prevention of vitamin D    deficiency: an Endocrine Society clinical practice    guideline. JCEM. 2011 Jul; 96(7):1911-30.   Ferritin     Status: None   Collection Time: 03/02/18 11:50 AM  Result Value Ref Range   Ferritin 47 15 - 150 ng/mL    Objective: General: Patient is awake, alert, and oriented x 3 and in no acute distress.  Integument: Skin is warm, dry and supple bilateral. Nails are tender, long, thickened and  dystrophic with subungual debris, consistent with onychomycosis, 1-5 bilateral. No signs of infection. No open lesions or preulcerative lesions present bilateral. Remaining integument unremarkable.  Vasculature:  Dorsalis Pedis pulse 2/4 bilateral. Posterior Tibial pulse faint 1/4 bilateral.  Capillary fill time <3 sec 1-5 bilateral. + scant hair growth to the level of the digits. Temperature gradient within normal limits. + varicosities present bilateral. Trace edema present bilateral.   Neurology: The patient has intact sensation measured with a 5.07/10g Semmes Weinstein Monofilament at all pedal sites bilateral. Vibratory sensation slightly diminished bilateral with tuning fork. No Babinski sign present bilateral.   Musculoskeletal: No symptomatic pedal deformities noted bilateral. Muscular strength 5/5 in all lower extremity muscular  groups bilateral without pain on range of motion . No tenderness with calf compression bilateral.  Assessment and Plan: Problem List Items Addressed This Visit    None    Visit Diagnoses    Pain due to onychomycosis of nail    -  Primary   Type II diabetes mellitus with peripheral circulatory disorder ([place])         -Examined patient. -Discussed and educated patient on diabetic foot care, especially with  regards to the vascular, neurological and musculoskeletal systems.  -Mechanically debrided all nails 1-5 bilateral using sterile nail nipper and filed with dremel without incident  -Patient to return  in 10 weeks for at risk foot care -Patient advised to call the office if any problems or questions arise in the meantime.  Landis Martins, DPM

## 2018-06-02 ENCOUNTER — Ambulatory Visit: Payer: 59 | Admitting: Podiatry

## 2018-06-22 ENCOUNTER — Ambulatory Visit: Payer: 59 | Admitting: Gastroenterology

## 2018-06-22 ENCOUNTER — Telehealth: Payer: Self-pay | Admitting: Family Medicine

## 2018-06-22 NOTE — Telephone Encounter (Signed)
Tried to call patient to let them know Dr. Shaw is no longer at [facility] and that their appointment with her is going to be cancelled. ° °LVM for patient, if patient calls back, please try to get them rescheduled with a different provider or let them know that Dr Shaw is going to be working at [facility] the number there is [phone]. °

## 2018-07-06 ENCOUNTER — Ambulatory Visit: Payer: 59 | Admitting: Family Medicine

## 2018-07-28 ENCOUNTER — Ambulatory Visit: Payer: 59 | Admitting: Sports Medicine

## 2018-08-11 ENCOUNTER — Encounter: Payer: Self-pay | Admitting: Sports Medicine

## 2018-08-11 ENCOUNTER — Ambulatory Visit: Payer: 59 | Admitting: Sports Medicine

## 2018-08-11 ENCOUNTER — Other Ambulatory Visit: Payer: Self-pay

## 2018-08-11 VITALS — Temp 97.5°F

## 2018-08-11 DIAGNOSIS — E1151 Type 2 diabetes mellitus with diabetic peripheral angiopathy without gangrene: Secondary | ICD-10-CM

## 2018-08-11 DIAGNOSIS — M79609 Pain in unspecified limb: Principal | ICD-10-CM

## 2018-08-11 DIAGNOSIS — B351 Tinea unguium: Secondary | ICD-10-CM

## 2018-08-11 DIAGNOSIS — M79676 Pain in unspecified toe(s): Secondary | ICD-10-CM

## 2018-08-11 NOTE — Progress Notes (Signed)
Subjective: Maria Sharp is a 53 y.o. female patient with history of diabetes who presents to office today complaining of long,mildly painful nails  while ambulating in shoes; unable to trim. Patient states that the glucose reading this morning was not recorded and last A1c 7 per patient. Patient denies any new changes in medication or new problems.  No other issues noted.  Patient Active Problem List   Diagnosis Date Noted  . Migraines 09/02/2017  . DJD (degenerative joint disease) 09/02/2017  . Vertigo 12/05/2016  . Bilateral temporomandibular joint pain 12/05/2016  . Constipation   . Asthma, chronic 09/28/2014  . Gastroparesis   . Varicose veins 09/14/2013  . Neck pain on left side 06/14/2013  . Current non-adherence to medical treatment 06/14/2013  . Vitamin D deficiency 10/05/2012  . Leg cramps 10/05/2012  . Family history of diabetes mellitus 10/05/2012  . Type 2 diabetes mellitus, uncontrolled ([place]) 05/28/2012  . 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 ABDOMINAL PAIN, UNSPECIFIED SITE 05/18/2010  . CHEST PAIN, INTERMITTENT 05/12/2009  . Obesity 04/05/2009  . CARDIOMEGALY 04/05/2009  . PALPITATIONS 04/05/2009  . Type 2 diabetes mellitus with peripheral circulatory disorder ([place]) 03/15/2009  . BACK PAIN 03/09/2008  . Hyperlipidemia LDL goal <100 03/03/2008  . Essential hypertension 03/03/2008  . GERD 03/03/2008  . IRRITABLE BOWEL SYNDROME 03/03/2008  . RECTAL BLEEDING 03/03/2008   Current Outpatient Medications on File Prior to Visit  Medication Sig Dispense Refill  . albuterol (PROVENTIL HFA;VENTOLIN HFA) 108 (90 Base) MCG/ACT inhaler INHALE 2 PUFFS Q 4 TO 6 H PRN  0  . ARNUITY ELLIPTA 100 MCG/ACT AEPB INHALE BY MOUTH ONCE PER DAY  6  . azelastine (ASTELIN) 0.1 % nasal spray U 1 TO 2 SPRAYS IEN BID  6  . benzonatate (TESSALON) 100 MG capsule Take 1-2 capsules (100-200 mg total) by mouth 3 (three) times daily as needed for cough. 40 capsule 0  . blood glucose meter  kit and supplies KIT Dispense based on patient and insurance preference. Use up to four times daily as directed. (FOR ICD-9 250.00, 250.01). 1 each 0  . chlorthalidone (HYGROTON) 25 MG tablet Take 1 tablet (25 mg total) by mouth daily. 90 tablet 1  . doxycycline (VIBRAMYCIN) 100 MG capsule Take 1 capsule (100 mg total) by mouth 2 (two) times daily. (Patient not taking: Reported on 03/26/2018) 20 capsule 0  . EPINEPHrine (EPI-PEN) 0.3 mg/0.3 mL DEVI Inject 0.3 mg into the muscle once as needed. For severe allergic reaction    . ergocalciferol (VITAMIN D2) 1.25 MG (50000 UT) capsule Vitamin D2 1,250 mcg (50,000 unit) capsule    . ipratropium (ATROVENT) 0.03 % nasal spray USE 2 SPRAYS IN EACH NOSTRIL TWICE DAILY 30 mL 2  . levocetirizine (XYZAL) 5 MG tablet TK 1 T PO QD IN THE EVE  6  . linaclotide (LINZESS) 72 MCG capsule Take 1 capsule (72 mcg total) by mouth daily before breakfast. (Patient taking differently: Take 72 mcg by mouth daily before breakfast. Prn constipation) 90 capsule 3  . linaclotide (LINZESS) 72 MCG capsule Take 1 capsule (72 mcg total) by mouth daily before breakfast. 30 capsule 3  . metFORMIN (GLUCOPHAGE-XR) 500 MG 24 hr tablet TAKE 4 TABLETS BY MOUTH ONCE DAILY WITH FOOD 360 tablet 3  . montelukast (SINGULAIR) 10 MG tablet Take 10 mg by mouth at bedtime.     Marland Kitchen olmesartan (BENICAR) 40 MG tablet Take 1 tablet (40 mg total) by mouth daily. 90 tablet 3  .  omeprazole (PRILOSEC) 40 MG capsule take 1 capsule by mouth once daily 90 capsule 3  . rosuvastatin (CRESTOR) 5 MG tablet Take 0.5 tablets (2.5 mg total) by mouth every other day. 45 tablet 1  . spironolactone (ALDACTONE) 25 MG tablet Take 1 tablet (25 mg total) by mouth 2 (two) times daily. 180 tablet 1  . SUPREP BOWEL PREP KIT 17.5-3.13-1.6 GM/177ML SOLN TAKE ONCE AS DIRECTED FOR 1 DOSE  0  . triamcinolone (NASACORT) 55 MCG/ACT AERO nasal inhaler Place 2 sprays into the nose daily. 1 Inhaler 12  . vitamin B-12 (CYANOCOBALAMIN) 1000  MCG tablet Take 1,000 mcg by mouth daily.    . vitamin E 400 UNIT capsule Take 400 Units by mouth daily.    . Vitamin Mixture (VITAMIN E COMPLETE PO) Take by mouth.     No current facility-administered medications on file prior to visit.    Allergies  Allergen Reactions  . Ace Inhibitors Anaphylaxis  . Shellfish Allergy Anaphylaxis and Other (See Comments)    Only shrimp  . Januvia [Sitagliptin] Other (See Comments)    Recurrent abdominal pain with elevated pancreas enzymes  . Latex Itching  . Lipitor [Atorvastatin]     No results found for this or any previous visit (from the past 2160 hour(s)).  Objective: General: Patient is awake, alert, and oriented x 3 and in no acute distress.  Integument: Skin is warm, dry and supple bilateral. Nails are tender, long, thickened and dystrophic with subungual debris, consistent with onychomycosis, 1-5 bilateral. No signs of infection. No open lesions or preulcerative lesions present bilateral. Remaining integument unremarkable.  Vasculature:  Dorsalis Pedis pulse 2/4 bilateral. Posterior Tibial pulse faint 1/4 bilateral.  Capillary fill time <3 sec 1-5 bilateral. + scant hair growth to the level of the digits. Temperature gradient within normal limits. + varicosities present bilateral. Trace edema present bilateral.   Neurology: The patient has intact sensation measured with a 5.07/10g Semmes Weinstein Monofilament at all pedal sites bilateral.   Musculoskeletal: No symptomatic pedal deformities noted bilateral. Muscular strength 5/5 in all lower extremity muscular groups bilateral without pain on range of motion . No tenderness with calf compression bilateral.  Assessment and Plan: Problem List Items Addressed This Visit    None    Visit Diagnoses    Pain due to onychomycosis of nail    -  Primary   Type II diabetes mellitus with peripheral circulatory disorder ([place])         -Examined patient. -Discussed and educated patient on  diabetic foot care, -Mechnically debrided all nails 1-5 bilateral using sterile nail nipper and filed with dremel without incident  -Patient to return in 10 weeks for at risk foot care -Patient advised to call the office if any problems or questions arise in the meantime.  Landis Martins, DPM

## 2018-08-21 ENCOUNTER — Telehealth: Payer: Self-pay | Admitting: Family Medicine

## 2018-08-21 NOTE — Telephone Encounter (Signed)
requesting PA.

## 2018-08-21 NOTE — Telephone Encounter (Signed)
Copied from [place] [id number]. Topic: General - Other >> Aug 21, 2018 10:06 AM Keene Breath wrote: Reason for CRM: Jaymie with [facility] called to request a PA for the patient's medication, metFORMIN (GLUCOPHAGE-XR) 500 MG 24 hr tablet.  Please advise and call with any questions at [phone]

## 2018-08-21 NOTE — Telephone Encounter (Signed)
Started PA  Key: [id number]

## 2018-08-24 ENCOUNTER — Telehealth: Payer: Self-pay | Admitting: Family Medicine

## 2018-08-24 NOTE — Telephone Encounter (Signed)
Copied from [place] [id number]. Topic: Quick Communication - Rx Refill/Question >> Aug 24, 2018  2:43 PM Pauline Good wrote: Medication: chlorthalidone 25mg  One Touch Delica Lancets One Touch Ulta Blue Test Strips  Has the patient contacted their pharmacy? Pharmacy is calling (Agent: If no, request that the patient contact the pharmacy for the refill.) (Agent: If yes, when and what did the pharmacy advise?)  Preferred Pharmacy (with phone number or street name): [facility]  Agent: Please be advised that RX refills may take up to 3 business days. We ask that you follow-up with your pharmacy.

## 2018-08-26 ENCOUNTER — Telehealth (INDEPENDENT_AMBULATORY_CARE_PROVIDER_SITE_OTHER): Payer: 59 | Admitting: Family Medicine

## 2018-08-26 ENCOUNTER — Other Ambulatory Visit: Payer: Self-pay

## 2018-08-26 ENCOUNTER — Encounter: Payer: Self-pay | Admitting: Family Medicine

## 2018-08-26 VITALS — Ht 70.0 in | Wt 236.0 lb

## 2018-08-26 DIAGNOSIS — Z20828 Contact with and (suspected) exposure to other viral communicable diseases: Secondary | ICD-10-CM | POA: Diagnosis not present

## 2018-08-26 DIAGNOSIS — Z20822 Contact with and (suspected) exposure to covid-19: Secondary | ICD-10-CM | POA: Insufficient documentation

## 2018-08-26 MED ORDER — BLOOD GLUCOSE MONITOR KIT: PACK | 0 refills | Status: DC

## 2018-08-26 MED ORDER — CHLORTHALIDONE 25 MG PO TABS: 25.0000 mg | ORAL_TABLET | Freq: Every day | ORAL | 0 refills | Status: DC

## 2018-08-26 NOTE — Progress Notes (Signed)
Telemedicine Encounter- SOAP NOTE Established Patient  I discussed the limitations, risks, security and privacy concerns of performing an evaluation and management service by telephone and the availability of in person appointments. I also discussed with the patient that there may be a patient responsible charge related to this service. The patient expressed understanding and agreed to proceed.  This telephone encounter was conducted with the patient's verbal consent via audio telecommunications: yes Patient was instructed to have this encounter in a suitably private space; and to only have persons present to whom they give permission to participate. In addition, patient identity was confirmed by use of name plus two identifiers (DOB and address).  I spent a total of 3mn talking with the patient about COVID and exposure risk.  Pt states that someone at her work tested positive for Covid 19. Pt state that she has no symptoms  And is feeling well. Pt is concern about going aback to work at this time.          Subjective   Maria CASSIDYis a 53y.o. female established patient. Telephone visit today for concerns about COVID. Pt with potential exposure on 4/24 when a co-worker who has tested positive was last in the work place. Pt states no direct contact at work but in the same building. 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 Pt is exhibiting NO symptoms of COVID-no fever, cough, SOB, sore throat, diarrhea or skin changes. Pt lives with boy friend and he has no symptoms. Pt took 5/5-5/6 off from work due to concerns about potential risk of COVID. Pt has been trying to take care of herself-watching diet and losing weight. Pt states no soda or fried foods. Pt has lost 42 lbs since December. Pt will start seeing Dr. SBrigitte Pulsewhen her office opens in June. Pt needs refills on blood pressure medication and diabetic supplies until an appointment in  June  Patient Active Problem List   Diagnosis Date Noted   Migraines 09/02/2017   DJD (degenerative joint disease) 09/02/2017   Vertigo 12/05/2016   Bilateral temporomandibular joint pain 12/05/2016   Constipation    Asthma, chronic 09/28/2014   Gastroparesis    Varicose veins 09/14/2013   Neck pain on left side 06/14/2013   Current non-adherence to medical treatment 06/14/2013   Vitamin D deficiency 10/05/2012   Leg cramps 10/05/2012   Family history of diabetes mellitus 10/05/2012   [place] 05/28/2012   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   ABDOMINAL PAIN, UNSPECIFIED SITE 05/18/2010   CHEST PAIN, INTERMITTENT 05/12/2009   Obesity 04/05/2009   CARDIOMEGALY 04/05/2009   PALPITATIONS 04/05/2009   Type 2 diabetes mellitus with peripheral circulatory disorder ([place] 03/15/2009   BACK PAIN 03/09/2008   Hyperlipidemia LDL goal <100 03/03/2008   Essential hypertension 03/03/2008   GERD 03/03/2008   IRRITABLE BOWEL SYNDROME 03/03/2008   RECTAL BLEEDING 03/03/2008    Past Medical History:  Diagnosis Date   Allergy    Asthma    Diabetes mellitus    Fatty liver    Gastritis    Gastroparesis    study 11 14  [place]  GERD (gastroesophageal reflux disease)    High cholesterol    History of varicella    Hypertension    IBS (irritable bowel syndrome)     Current Outpatient Medications  Medication Sig Dispense Refill   albuterol (PROVENTIL HFA;VENTOLIN HFA) 108 (90 Base)  MCG/ACT inhaler INHALE 2 PUFFS Q 4 TO 6 H PRN  0   ARNUITY ELLIPTA 100 MCG/ACT AEPB INHALE BY MOUTH ONCE PER DAY  6   azelastine (ASTELIN) 0.1 % nasal spray U 1 TO 2 SPRAYS IEN BID  6   blood glucose meter kit and supplies KIT Dispense based on patient and insurance preference. Use up to four times daily as directed. (FOR ICD-9 250.00, 250.01). 1 each 0   chlorthalidone (HYGROTON) 25 MG tablet Take 1 tablet (25 mg total) by mouth  daily. 90 tablet 1   doxycycline (VIBRAMYCIN) 100 MG capsule Take 1 capsule (100 mg total) by mouth 2 (two) times daily. 20 capsule 0   EPINEPHrine (EPI-PEN) 0.3 mg/0.3 mL DEVI Inject 0.3 mg into the muscle once as needed. For severe allergic reaction     ergocalciferol (VITAMIN D2) 1.25 MG (50000 UT) capsule Vitamin D2 1,250 mcg (50,000 unit) capsule     ipratropium (ATROVENT) 0.03 % nasal spray USE 2 SPRAYS IN EACH NOSTRIL TWICE DAILY 30 mL 2   levocetirizine (XYZAL) 5 MG tablet TK 1 T PO QD IN THE EVE  6   linaclotide (LINZESS) 72 MCG capsule Take 1 capsule (72 mcg total) by mouth daily before breakfast. (Patient taking differently: Take 72 mcg by mouth daily before breakfast. Prn constipation) 90 capsule 3   linaclotide (LINZESS) 72 MCG capsule Take 1 capsule (72 mcg total) by mouth daily before breakfast. 30 capsule 3   metFORMIN (GLUCOPHAGE-XR) 500 MG 24 hr tablet TAKE 4 TABLETS BY MOUTH ONCE DAILY WITH FOOD 360 tablet 3   montelukast (SINGULAIR) 10 MG tablet Take 10 mg by mouth at bedtime.      olmesartan (BENICAR) 40 MG tablet Take 1 tablet (40 mg total) by mouth daily. 90 tablet 3   omeprazole (PRILOSEC) 40 MG capsule take 1 capsule by mouth once daily 90 capsule 3   rosuvastatin (CRESTOR) 5 MG tablet Take 0.5 tablets (2.5 mg total) by mouth every other day. 45 tablet 1   spironolactone (ALDACTONE) 25 MG tablet Take 1 tablet (25 mg total) by mouth 2 (two) times daily. 180 tablet 1   triamcinolone (NASACORT) 55 MCG/ACT AERO nasal inhaler Place 2 sprays into the nose daily. 1 Inhaler 12   vitamin B-12 (CYANOCOBALAMIN) 1000 MCG tablet Take 1,000 mcg by mouth daily.     vitamin E 400 UNIT capsule Take 400 Units by mouth daily.     Vitamin Mixture (VITAMIN E COMPLETE PO) Take by mouth.     benzonatate (TESSALON) 100 MG capsule Take 1-2 capsules (100-200 mg total) by mouth 3 (three) times daily as needed for cough. (Patient not taking: Reported on 08/26/2018) 40 capsule 0    SUPREP BOWEL PREP KIT 17.5-3.13-1.6 GM/177ML SOLN TAKE ONCE AS DIRECTED FOR 1 DOSE  0   No current facility-administered medications for this visit.     Allergies  Allergen Reactions   Ace Inhibitors Anaphylaxis   Shellfish Allergy Anaphylaxis and Other (See Comments)    Only shrimp   Januvia [Sitagliptin] Other (See Comments)    Recurrent abdominal pain with elevated pancreas enzymes   Latex Itching   Lipitor [Atorvastatin]     Social History   Socioeconomic History   Marital status: Single    Spouse name: Not on file   Number of children: 0   Years of education: Not on file   Highest education level: Not on file  Occupational History   Occupation: [place]  Social Needs  Financial resource strain: Not on file   Food insecurity:    Worry: Not on file    Inability: Not on file   Transportation needs:    Medical: Not on file    Non-medical: Not on file  Tobacco Use   Smoking status: Never Smoker   Smokeless tobacco: Never Used  Substance and Sexual Activity   Alcohol use: No   Drug use: No   Sexual activity: Not on file  Lifestyle   Physical activity:    Days per week: Not on file    Minutes per session: Not on file   Stress: Not on file  Relationships   Social connections:    Talks on phone: Not on file    Gets together: Not on file    Attends religious service: Not on file    Active member of club or organization: Not on file    Attends meetings of clubs or organizations: Not on file    Relationship status: Not on file   Intimate partner violence:    Fear of current or ex partner: Not on file    Emotionally abused: Not on file    Physically abused: Not on file    Forced sexual activity: Not on file  Other Topics Concern   Not on file  Social History Narrative   Usually # of hours of sleep per night: 6   3 of people living at your residence? 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   Irregular sleep 7:30 to 11 or 12 and then 4 to 6:30 before she goes to work   Has attended college.   Originally from [place].   G0P0   Abnormal pap remote last 2012     ROS CONSTITUTIONAL: weight loss-intentional, NO fever EENT: NO sinus problems, NOnasal congestion, NO sore throat CV: no chest pain,  RESP: NO SOB, NO cough GI: no bowel changes  Objective   Vitals as reported by the patient: Today's Vitals   08/26/18 1050  Weight: 236 lb (107 kg)  Height: 5' 10" (1.778 m)   Pt can check bp at home  Pt needs diabetic supplies to check glucose readings 1. Exposure to Covid-19 Virus Pt notified work related exposure-no symptoms currently. +COVID work associates last day at work 4/24. Pt not in direct contact with co-worker. Work note for 5/4 and 5/6. Pt to return to work 5/7. Discussed with pt symptoms of COVID. Reinforced CDC guidelines. Pt states 6 foot between workers. Encouraged eating in car if safe to avoid contact with other without masks. Wear gloves at work.  Washing hands. Laundry for clothes worn at work and shower after work suggested.  Pt will check temp each morning-if elevated will call for recommendations.  Pt to check blood pressure and glucose monitoring with weight loss and diet changes.     I discussed the assessment and treatment plan with the patient. The patient was provided an opportunity to ask questions and all were answered. The patient agreed with the plan and demonstrated an understanding of the instructions.   The patient was advised to call back or seek an in-person evaluation if  symptoms of COVID noted I provided 35 minutes of non-face-to-face time during this encounter.  LISA Hannah Beat, MD  Primary Care at [facility] 08/26/18

## 2018-08-26 NOTE — Progress Notes (Signed)
Pt states that someone at her work tested positive for Covid 19. Pt state that she has no symptoms  And is feeling well. Pt is concern about going aback to work at this time.

## 2018-08-27 NOTE — Telephone Encounter (Signed)
jad tele med yesterday and everything was sent in.

## 2018-08-31 ENCOUNTER — Encounter: Payer: Self-pay | Admitting: General Surgery

## 2018-09-01 ENCOUNTER — Ambulatory Visit (INDEPENDENT_AMBULATORY_CARE_PROVIDER_SITE_OTHER): Payer: 59 | Admitting: Gastroenterology

## 2018-09-01 ENCOUNTER — Other Ambulatory Visit: Payer: Self-pay

## 2018-09-01 ENCOUNTER — Encounter: Payer: Self-pay | Admitting: Gastroenterology

## 2018-09-01 VITALS — Ht 70.0 in | Wt 232.0 lb

## 2018-09-01 DIAGNOSIS — R109 Unspecified abdominal pain: Secondary | ICD-10-CM | POA: Diagnosis not present

## 2018-09-01 DIAGNOSIS — K219 Gastro-esophageal reflux disease without esophagitis: Secondary | ICD-10-CM | POA: Diagnosis not present

## 2018-09-01 DIAGNOSIS — K581 Irritable bowel syndrome with constipation: Secondary | ICD-10-CM

## 2018-09-01 MED ORDER — LINACLOTIDE 72 MCG PO CAPS: 72.0000 ug | ORAL_CAPSULE | Freq: Every day | ORAL | 1 refills | Status: DC

## 2018-09-01 MED ORDER — DICYCLOMINE HCL 10 MG PO CAPS: 10.0000 mg | ORAL_CAPSULE | Freq: Three times a day (TID) | ORAL | 3 refills | Status: DC

## 2018-09-01 NOTE — Progress Notes (Signed)
Maria Sharp    [id number]    1966/03/24  Primary Care Physician:Shaw, Laurey Arrow, MD  Referring Physician: Shawnee Knapp, MD No address on file  This service was provided via audio only telemedicine (Dox[employer]) due to [place] 19 pandemic.  Patient location: Home Provider location: Office Used 2 patient identifiers to confirm the correct person. Explained the limitations in evaluation and management via telemedicine. Patient is aware of potential medical charges for this visit.  Patient consented to this virtual visit.  The persons participating in this telemedicine service were myself and the patient  Interactive audio and video telecommunications were attempted between this provider and patient, however failed, due to patient having technical difficulties OR patient did not have access to video capability. We continued and completed visit with audio only.    Chief complaint: Abdominal pain, cramping  HPI: 53 year old female with history of chronic irritable bowel syndrome, constipation alternating with diarrhea She has intentionally lost weight 40 lbs She has intermittent generalized abdominal pain, discomfort associated with cramping worse when she does not have a bowel movement for a few days and subsequently when she has multiple BM.  Denies any nausea, vomiting, melena or blood per rectum.  No loss of appetite.  Colonoscopy March 26, 2018 with removal of 2 small subcentimeter sessile tubular adenomatous polyps and internal hemorrhoids   Outpatient Encounter Medications as of 09/01/2018  Medication Sig  . albuterol (PROVENTIL HFA;VENTOLIN HFA) 108 (90 Base) MCG/ACT inhaler INHALE 2 PUFFS Q 4 TO 6 H PRN  . ARNUITY ELLIPTA 100 MCG/ACT AEPB INHALE BY MOUTH ONCE PER DAY  . azelastine (ASTELIN) 0.1 % nasal spray U 1 TO 2 SPRAYS IEN BID  . benzonatate (TESSALON) 100 MG capsule Take 1-2 capsules (100-200 mg total) by mouth 3 (three) times daily as needed for cough.   . blood glucose meter kit and supplies KIT Dispense based on patient and insurance preference. Use up to four times daily as directed. (FOR ICD-9 250.00, 250.01).  . Calcium Carbonate-Vit D-Min (CALCIUM 1200 PO) Take by mouth.  . chlorthalidone (HYGROTON) 25 MG tablet Take 1 tablet (25 mg total) by mouth daily.  . Cholecalciferol (D3-1000 PO) Take 2,000 mg by mouth 2 (two) times a day.  Marland Kitchen doxycycline (VIBRAMYCIN) 100 MG capsule Take 1 capsule (100 mg total) by mouth 2 (two) times daily.  Marland Kitchen EPINEPHrine (EPI-PEN) 0.3 mg/0.3 mL DEVI Inject 0.3 mg into the muscle once as needed. For severe allergic reaction  . ergocalciferol (VITAMIN D2) 1.25 MG (50000 UT) capsule Vitamin D2 1,250 mcg (50,000 unit) capsule  . ipratropium (ATROVENT) 0.03 % nasal spray USE 2 SPRAYS IN EACH NOSTRIL TWICE DAILY  . levocetirizine (XYZAL) 5 MG tablet TK 1 T PO QD IN THE EVE  . linaclotide (LINZESS) 72 MCG capsule Take 1 capsule (72 mcg total) by mouth daily before breakfast. (Patient taking differently: Take 72 mcg by mouth daily before breakfast. Prn constipation)  . linaclotide (LINZESS) 72 MCG capsule Take 1 capsule (72 mcg total) by mouth daily before breakfast.  . metFORMIN (GLUCOPHAGE-XR) 500 MG 24 hr tablet TAKE 4 TABLETS BY MOUTH ONCE DAILY WITH FOOD  . montelukast (SINGULAIR) 10 MG tablet Take 10 mg by mouth at bedtime.   Marland Kitchen olmesartan (BENICAR) 40 MG tablet Take 1 tablet (40 mg total) by mouth daily.  Marland Kitchen omeprazole (PRILOSEC) 40 MG capsule take 1 capsule by mouth once daily  . rosuvastatin (CRESTOR) 5 MG tablet Take  0.5 tablets (2.5 mg total) by mouth every other day.  . spironolactone (ALDACTONE) 25 MG tablet Take 1 tablet (25 mg total) by mouth 2 (two) times daily.  Manus Gunning BOWEL PREP KIT 17.5-3.13-1.6 GM/177ML SOLN TAKE ONCE AS DIRECTED FOR 1 DOSE  . triamcinolone (NASACORT) 55 MCG/ACT AERO nasal inhaler Place 2 sprays into the nose daily.  . vitamin B-12 (CYANOCOBALAMIN) 1000 MCG tablet Take 1,000 mcg by mouth  daily.  . vitamin E 400 UNIT capsule Take 400 Units by mouth daily.  . Vitamin Mixture (VITAMIN E COMPLETE PO) Take by mouth.   No facility-administered encounter medications on file as of 09/01/2018.     Allergies as of 09/01/2018 - Review Complete 09/01/2018  Allergen Reaction Noted  . Ace inhibitors Anaphylaxis 03/03/2008  . Shellfish allergy Anaphylaxis and Other (See Comments) 05/25/2011  . Januvia [sitagliptin] Other (See Comments) 10/05/2012  . Latex Itching   . Lipitor [atorvastatin]  02/10/2013    Past Medical History:  Diagnosis Date  . Allergy   . Asthma   . Diabetes mellitus   . Fatty liver   . Gastritis   . Gastroparesis    study 11 14  nl abd [place]  . GERD (gastroesophageal reflux disease)   . High cholesterol   . History of varicella   . Hypertension   . IBS (irritable bowel syndrome)     Past Surgical History:  Procedure Laterality Date  . ANAL RECTAL MANOMETRY N/A 04/03/2015   Procedure: ANO RECTAL MANOMETRY;  Surgeon: Mauri Pole, MD;  Location: [facility];  Service: Endoscopy;  Laterality: N/A;  . CHOLECYSTECTOMY    . WISDOM TOOTH EXTRACTION      Family History  Problem Relation Age of Onset  . Prostate cancer Father   . Diabetes Father   . Hypertension Father   . Diabetes Mother   . Heart disease Mother   . Arthritis Mother   . Hyperlipidemia Mother   . Hypertension Mother   . Parkinson's disease Mother   . Thyroid disease Mother   . Diabetes Paternal Grandmother   . Arthritis Paternal Grandmother   . Hyperlipidemia Paternal Grandmother   . Heart disease Paternal Grandmother   . Dementia Paternal Grandmother   . Hypertension Paternal Grandmother   . Hyperlipidemia Maternal Grandmother   . Hypertension Maternal Grandmother   . Hyperlipidemia Maternal Grandfather   . Hypertension Maternal Grandfather   . Hyperlipidemia Paternal Grandfather   . Hypertension Paternal Grandfather   . Diabetes Paternal Grandfather   . Cholelithiasis  Paternal Grandfather   . Colon cancer Neg Hx   . Esophageal cancer Neg Hx   . Stomach cancer Neg Hx   . Rectal cancer Neg Hx     Social History   Socioeconomic History  . Marital status: Single    Spouse name: Not on file  . Number of children: 0  . Years of education: Not on file  . Highest education level: Not on file  Occupational History  . Occupation: Marine scientist  Social Needs  . Financial resource strain: Not on file  . Food insecurity:    Worry: Not on file    Inability: Not on file  . Transportation needs:    Medical: Not on file    Non-medical: Not on file  Tobacco Use  . Smoking status: Never Smoker  . Smokeless tobacco: Never Used  Substance and Sexual Activity  . Alcohol use: No  . Drug use: No  . Sexual activity: Not on  file  Lifestyle  . Physical activity:    Days per week: Not on file    Minutes per session: Not on file  . Stress: Not on file  Relationships  . Social connections:    Talks on phone: Not on file    Gets together: Not on file    Attends religious service: Not on file    Active member of club or organization: Not on file    Attends meetings of clubs or organizations: Not on file    Relationship status: Not on file  . Intimate partner violence:    Fear of current or ex partner: Not on file    Emotionally abused: Not on file    Physically abused: Not on file    Forced sexual activity: Not on file  Other Topics Concern  . Not on file  Social History Narrative   Usually # of hours of sleep per night: 6   3 of people living at your residence? 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   Irregular sleep 7:30 to 11 or 12 and then 4 to 6:30 before she goes to work   Has attended college.   Originally from [place]   Abnormal pap remote last 2012       Review of systems: Review of Systems as per HPI All other  systems reviewed and are negative.   Physical Exam: Vitals were not taken and physical exam was not performed during this virtual visit.  Data Reviewed:  Reviewed labs, radiology imaging, old records and pertinent past GI work up   Assessment and Plan/Recommendations: 53 year old female with irritable bowel syndrome predominant constipation associated with generalized abdominal discomfort and cramping  Linzess 72 mcg daily, take capsule after meal.  If no improvement of bowel habits, continues to have persistent constipation, will plan to increase the dose to 145 mg daily.  Please call or send message in 1 to 2 weeks to give an update  Dicyclomine 10 mg with meals and at bedtime as needed for abdominal cramping, please send 90-day supply  GERD :continue Protonix 40 mg daily Antireflux measures  Follow-up in 3 months or sooner if needed     K. Denzil Magnuson , MD   CC: Shawnee Knapp, MD

## 2018-09-01 NOTE — Patient Instructions (Addendum)
Linzess 72 mcg daily, take capsule after meal.  If no improvement of bowel habits, continues to have persistent constipation, will plan to increase the dose to 145 mg daily.  Please call or send message in 1 to 2 weeks to give an update ( we sent in a new prescription of Linzess to your pharmacy)  Dicyclomine 10 mg with meals and at bedtime as needed for abdominal cramping, please send 90-day supply ( sent to your pharmacy)   Continue Protonix 40 mg daily  Antireflux measures   Gastroesophageal Reflux Disease, Adult Gastroesophageal reflux (GER) happens when acid from the stomach flows up into the tube that connects the mouth and the stomach (esophagus). Normally, food travels down the esophagus and stays in the stomach to be digested. However, when a person has GER, food and stomach acid sometimes move back up into the esophagus. If this becomes a more serious problem, the person may be diagnosed with a disease called gastroesophageal reflux disease (GERD). GERD occurs when the reflux:  Happens often.  Causes frequent or severe symptoms.  Causes problems such as damage to the esophagus. When stomach acid comes in contact with the esophagus, the acid may cause soreness (inflammation) in the esophagus. Over time, GERD may create small holes (ulcers) in the lining of the esophagus. What are the causes? This condition is caused by a problem with the muscle between the esophagus and the stomach (lower esophageal sphincter, or LES). Normally, the LES muscle closes after food passes through the esophagus to the stomach. When the LES is weakened or abnormal, it does not close properly, and that allows food and stomach acid to go back up into the esophagus. The LES can be weakened by certain dietary substances, medicines, and medical conditions, including:  Tobacco use.  Pregnancy.  Having a hiatal hernia.  Alcohol use.  Certain foods and beverages, such as coffee, chocolate, onions, and  peppermint. What increases the risk? You are more likely to develop this condition if you:  Have an increased body weight.  Have a connective tissue disorder.  Use NSAID medicines. What are the signs or symptoms? Symptoms of this condition include:  Heartburn.  Difficult or painful swallowing.  The feeling of having a lump in the throat.  Abitter taste in the mouth.  Bad breath.  Having a large amount of saliva.  Having an upset or bloated stomach.  Belching.  Chest pain. Different conditions can cause chest pain. Make sure you see your health care provider if you experience chest pain.  Shortness of breath or wheezing.  Ongoing (chronic) cough or a night-time cough.  Wearing away of tooth enamel.  Weight loss. How is this diagnosed? Your health care provider will take a medical history and perform a physical exam. To determine if you have mild or severe GERD, your health care provider may also monitor how you respond to treatment. You may also have tests, including:  A test to examine your stomach and esophagus with a small camera (endoscopy).  A test thatmeasures the acidity level in your esophagus.  A test thatmeasures how much pressure is on your esophagus.  A barium swallow or modified barium swallow test to show the shape, size, and functioning of your esophagus. How is this treated? The goal of treatment is to help relieve your symptoms and to prevent complications. Treatment for this condition may vary depending on how severe your symptoms are. Your health care provider may recommend:  Changes to your diet.  Medicine.  Surgery. Follow these instructions at home: Eating and drinking   Follow a diet as recommended by your health care provider. This may involve avoiding foods and drinks such as: ? Coffee and tea (with or without caffeine). ? Drinks that containalcohol. ? Energy drinks and sports drinks. ? Carbonated drinks or sodas. ? Chocolate  and cocoa. ? Peppermint and mint flavorings. ? Garlic and onions. ? Horseradish. ? Spicy and acidic foods, including peppers, chili powder, curry powder, vinegar, hot sauces, and barbecue sauce. ? Citrus fruit juices and citrus fruits, such as oranges, lemons, and limes. ? Tomato-based foods, such as red sauce, chili, salsa, and pizza with red sauce. ? Fried and fatty foods, such as donuts, french fries, potato chips, and high-fat dressings. ? High-fat meats, such as hot dogs and fatty cuts of red and white meats, such as rib eye steak, sausage, ham, and bacon. ? High-fat dairy items, such as whole milk, butter, and cream cheese.  Eat small, frequent meals instead of large meals.  Avoid drinking large amounts of liquid with your meals.  Avoid eating meals during the 2-3 hours before bedtime.  Avoid lying down right after you eat.  Do not exercise right after you eat. Lifestyle   Do not use any products that contain nicotine or tobacco, such as cigarettes, e-cigarettes, and chewing tobacco. If you need help quitting, ask your health care provider.  Try to reduce your stress by using methods such as yoga or meditation. If you need help reducing stress, ask your health care provider.  If you are overweight, reduce your weight to an amount that is healthy for you. Ask your health care provider for guidance about a safe weight loss goal. General instructions  Pay attention to any changes in your symptoms.  Take over-the-counter and prescription medicines only as told by your health care provider. Do not take aspirin, ibuprofen, or other NSAIDs unless your health care provider told you to do so.  Wear loose-fitting clothing. Do not wear anything tight around your waist that causes pressure on your abdomen.  Raise (elevate) the head of your bed about 6 inches (15 cm).  Avoid bending over if this makes your symptoms worse.  Keep all follow-up visits as told by your health care  provider. This is important. Contact a health care provider if:  You have: ? New symptoms. ? Unexplained weight loss. ? Difficulty swallowing or it hurts to swallow. ? Wheezing or a persistent cough. ? A hoarse voice.  Your symptoms do not improve with treatment. Get help right away if you:  Have pain in your arms, neck, jaw, teeth, or back.  Feel sweaty, dizzy, or light-headed.  Have chest pain or shortness of breath.  Vomit and your vomit looks like blood or coffee grounds.  Faint.  Have stool that is bloody or black.  Cannot swallow, drink, or eat. Summary  Gastroesophageal reflux happens when acid from the stomach flows up into the esophagus. GERD is a disease in which the reflux happens often, causes frequent or severe symptoms, or causes problems such as damage to the esophagus.  Treatment for this condition may vary depending on how severe your symptoms are. Your health care provider may recommend diet and lifestyle changes, medicine, or surgery.  Contact a health care provider if you have new or worsening symptoms.  Take over-the-counter and prescription medicines only as told by your health care provider. Do not take aspirin, ibuprofen, or other NSAIDs unless your health care provider told  you to do so.  Keep all follow-up visits as told by your health care provider. This is important. This information is not intended to replace advice given to you by your health care provider. Make sure you discuss any questions you have with your health care provider. Document Released: 01/16/2005 Document Revised: 10/15/2017 Document Reviewed: 10/15/2017 Elsevier Interactive Patient Education  [employer].    Follow-up in 3 months or sooner if needed

## 2018-09-04 ENCOUNTER — Encounter: Payer: Self-pay | Admitting: Gastroenterology

## 2018-09-08 ENCOUNTER — Ambulatory Visit: Payer: 59 | Admitting: Sports Medicine

## 2018-09-23 ENCOUNTER — Other Ambulatory Visit: Payer: Self-pay | Admitting: Gastroenterology

## 2018-10-20 ENCOUNTER — Ambulatory Visit: Payer: 59 | Admitting: Sports Medicine

## 2018-10-20 ENCOUNTER — Other Ambulatory Visit: Payer: Self-pay

## 2018-10-20 ENCOUNTER — Encounter: Payer: Self-pay | Admitting: Sports Medicine

## 2018-10-20 DIAGNOSIS — B351 Tinea unguium: Secondary | ICD-10-CM | POA: Diagnosis not present

## 2018-10-20 DIAGNOSIS — M79676 Pain in unspecified toe(s): Secondary | ICD-10-CM

## 2018-10-20 DIAGNOSIS — M79609 Pain in unspecified limb: Secondary | ICD-10-CM

## 2018-10-20 DIAGNOSIS — E1151 Type 2 diabetes mellitus with diabetic peripheral angiopathy without gangrene: Secondary | ICD-10-CM

## 2018-10-20 NOTE — Progress Notes (Signed)
Subjective: Maria Sharp is a 53 y.o. female patient with history of diabetes who presents to office today complaining of long,mildly painful nails  while ambulating in shoes; unable to trim. Patient states that the glucose reading was 113 last week and last A1c 7 per patient. Patient denies any new changes in medication or new problems. No other issues noted.  Patient Active Problem List   Diagnosis Date Noted  . Exposure to Covid-19 Virus 08/26/2018  . Migraines 09/02/2017  . DJD (degenerative joint disease) 09/02/2017  . Vertigo 12/05/2016  . Bilateral temporomandibular joint pain 12/05/2016  . Constipation   . Asthma, chronic 09/28/2014  . Gastroparesis   . Varicose veins 09/14/2013  . Neck pain on left side 06/14/2013  . Current non-adherence to medical treatment 06/14/2013  . Vitamin D deficiency 10/05/2012  . Leg cramps 10/05/2012  . Family history of diabetes mellitus 10/05/2012  . Type 2 diabetes mellitus, uncontrolled (HCC) 05/28/2012  . 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 ABDOMINAL PAIN, UNSPECIFIED SITE 05/18/2010  . CHEST PAIN, INTERMITTENT 05/12/2009  . Obesity 04/05/2009  . CARDIOMEGALY 04/05/2009  . PALPITATIONS 04/05/2009  . Type 2 diabetes mellitus with peripheral circulatory disorder (HCC) 03/15/2009  . BACK PAIN 03/09/2008  . Hyperlipidemia LDL goal <100 03/03/2008  . Essential hypertension 03/03/2008  . GERD 03/03/2008  . IRRITABLE BOWEL SYNDROME 03/03/2008  . RECTAL BLEEDING 03/03/2008   Current Outpatient Medications on File Prior to Visit  Medication Sig Dispense Refill  . albuterol (PROVENTIL HFA;VENTOLIN HFA) 108 (90 Base) MCG/ACT inhaler INHALE 2 PUFFS Q 4 TO 6 H PRN  0  . ARNUITY ELLIPTA 100 MCG/ACT AEPB INHALE BY MOUTH ONCE PER DAY  6  . azelastine (ASTELIN) 0.1 % nasal spray U 1 TO 2 SPRAYS IEN BID  6  . benzonatate (TESSALON) 100 MG capsule Take 1-2 capsules (100-200 mg total) by mouth 3 (three) times daily as needed for cough. 40 capsule 0   . blood glucose meter kit and supplies KIT Dispense based on patient and insurance preference. Use up to four times daily as directed. (FOR ICD-9 250.00, 250.01). 1 each 0  . Calcium Carbonate-Vit D-Min (CALCIUM 1200 PO) Take by mouth.    . chlorthalidone (HYGROTON) 25 MG tablet Take 1 tablet (25 mg total) by mouth daily. 90 tablet 0  . Cholecalciferol (D3-1000 PO) Take 2,000 mg by mouth 2 (two) times a day.    . dicyclomine (BENTYL) 10 MG capsule Take 1 capsule (10 mg total) by mouth 4 (four) times daily -  before meals and at bedtime. 120 capsule 3  . doxycycline (VIBRAMYCIN) 100 MG capsule Take 1 capsule (100 mg total) by mouth 2 (two) times daily. 20 capsule 0  . EPINEPHrine (EPI-PEN) 0.3 mg/0.3 mL DEVI Inject 0.3 mg into the muscle once as needed. For severe allergic reaction    . ergocalciferol (VITAMIN D2) 1.25 MG (50000 UT) capsule Vitamin D2 1,250 mcg (50,000 unit) capsule    . ipratropium (ATROVENT) 0.03 % nasal spray USE 2 SPRAYS IN EACH NOSTRIL TWICE DAILY 30 mL 2  . levocetirizine (XYZAL) 5 MG tablet TK 1 T PO QD IN THE EVE  6  . linaclotide (LINZESS) 72 MCG capsule Take 1 capsule (72 mcg total) by mouth daily before breakfast. 30 capsule 1  . metFORMIN (GLUCOPHAGE-XR) 500 MG 24 hr tablet TAKE 4 TABLETS BY MOUTH ONCE DAILY WITH FOOD 360 tablet 3  . montelukast (SINGULAIR) 10 MG tablet Take 10 mg by mouth at bedtime.     .   olmesartan (BENICAR) 40 MG tablet Take 1 tablet (40 mg total) by mouth daily. 90 tablet 3  . omeprazole (PRILOSEC) 40 MG capsule TAKE 1 CAPSULE BY MOUTH EVERY DAY 30 capsule 3  . rosuvastatin (CRESTOR) 5 MG tablet Take 0.5 tablets (2.5 mg total) by mouth every other day. 45 tablet 1  . spironolactone (ALDACTONE) 25 MG tablet Take 1 tablet (25 mg total) by mouth 2 (two) times daily. 180 tablet 1  . SUPREP BOWEL PREP KIT 17.5-3.13-1.6 GM/177ML SOLN TAKE ONCE AS DIRECTED FOR 1 DOSE  0  . triamcinolone (NASACORT) 55 MCG/ACT AERO nasal inhaler Place 2 sprays into the  nose daily. 1 Inhaler 12  . vitamin B-12 (CYANOCOBALAMIN) 1000 MCG tablet Take 1,000 mcg by mouth daily.    . vitamin E 400 UNIT capsule Take 400 Units by mouth daily.    . Vitamin Mixture (VITAMIN E COMPLETE PO) Take by mouth.     No current facility-administered medications on file prior to visit.    Allergies  Allergen Reactions  . Ace Inhibitors Anaphylaxis  . Shellfish Allergy Anaphylaxis and Other (See Comments)    Only shrimp  . Januvia [Sitagliptin] Other (See Comments)    Recurrent abdominal pain with elevated pancreas enzymes  . Latex Itching  . Lipitor [Atorvastatin]     No results found for this or any previous visit (from the past 2160 hour(s)).  Objective: General: Patient is awake, alert, and oriented x 3 and in no acute distress.  Integument: Skin is warm, dry and supple bilateral. Nails are tender, long, thickened and dystrophic with subungual debris, consistent with onychomycosis, 1-5 bilateral. No signs of infection. No open lesions or preulcerative lesions present bilateral. Remaining integument unremarkable.  Vasculature:  Dorsalis Pedis pulse 2/4 bilateral. Posterior Tibial pulse faint 1/4 bilateral.  Capillary fill time <3 sec 1-5 bilateral. + scant hair growth to the level of the digits. Temperature gradient within normal limits. + varicosities present bilateral. Trace edema present bilateral.   Neurology: The patient has intact sensation measured with a 5.07/10g Semmes Weinstein Monofilament at all pedal sites bilateral.   Musculoskeletal: No symptomatic pedal deformities noted bilateral. Muscular strength 5/5 in all lower extremity muscular groups bilateral without pain on range of motion . No tenderness with calf compression bilateral.  Assessment and Plan: Problem List Items Addressed This Visit    None    Visit Diagnoses    Pain due to onychomycosis of nail    -  Primary   Type II diabetes mellitus with peripheral circulatory disorder ([place])          -Examined patient. -Discussed and educated patient on diabetic foot care, -Mechnically debrided all nails 1-5 bilateral using sterile nail nipper and filed with dremel without incident  -Patient to return in 10 weeks for at risk foot care -Patient advised to call the office if any problems or questions arise in the meantime.  Landis Martins, DPM

## 2018-10-26 ENCOUNTER — Telehealth: Payer: Self-pay | Admitting: Family Medicine

## 2018-10-26 NOTE — Telephone Encounter (Signed)
Medication Refill - Medication: olmesartan (BENICAR) 20 MG tablet [[id number]]    Has the patient contacted their pharmacy? Yes.    (Agent: If yes, when and what did the pharmacy advise?) The pharmacy requested this refill  Preferred Pharmacy :  [facility] [place], [address] [phone] (Phone) [phone] (Fax)     Agent: Please be advised that RX refills may take up to 3 business days. We ask that you follow-up with your pharmacy.

## 2018-11-02 NOTE — Telephone Encounter (Signed)
Patient needs an appointment TOC  We were not prescribing doctor she can call the original doctor to have filled

## 2018-11-20 ENCOUNTER — Telehealth: Payer: Self-pay | Admitting: Gastroenterology

## 2018-11-20 ENCOUNTER — Other Ambulatory Visit: Payer: Self-pay

## 2018-11-20 MED ORDER — HYDROCORTISONE ACETATE 25 MG RE SUPP: 25.0000 mg | Freq: Two times a day (BID) | RECTAL | 0 refills | Status: DC

## 2018-11-20 NOTE — Telephone Encounter (Signed)
Doc of the day. Patient of Dr Silverio Decamp. Last office visit was 09/01/18. Last colonoscopy was 03/2018 Spoke with the patient. She has had some hard stools past weeks. No has a bulging and burning at the rectum. She feels an itching burning sensation. 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 Sitting for hours a day has made this worse. She denies painful bowel movements. No blood with her bowel movements. She agrees to begin daily Miralax with her Linzess. She asks for Rx hemorrhoidal treatment and prefers suppositories. Please advise.

## 2018-11-20 NOTE — Telephone Encounter (Signed)
Pt needs something for her hemorrhoids, she states that it feels inflamed. Pls call her.

## 2018-11-20 NOTE — Telephone Encounter (Signed)
Patient is instructed. Thanks me for my help. Agrees to this plan.

## 2018-11-20 NOTE — Telephone Encounter (Signed)
I agree with your recommendations for Miralax. High fiber diet recommended. Please drink at least 1.5-2 liters of water every day. Sitz Bath may provide some relief. I recommend Anusol HC 25mg  suppositories BID x 7-10 days. She should call Dr. Silverio Decamp with any update next week. If the pain has not improved, other etiologies for her symptoms need to be considered. She could visit [email or web address] and [email or web address] for good information about hemorrhoids in the meantime. Thank you.

## 2018-12-29 ENCOUNTER — Other Ambulatory Visit: Payer: Self-pay

## 2018-12-29 ENCOUNTER — Encounter: Payer: Self-pay | Admitting: Sports Medicine

## 2018-12-29 ENCOUNTER — Ambulatory Visit: Payer: 59 | Admitting: Sports Medicine

## 2018-12-29 DIAGNOSIS — B351 Tinea unguium: Secondary | ICD-10-CM | POA: Diagnosis not present

## 2018-12-29 DIAGNOSIS — M79609 Pain in unspecified limb: Secondary | ICD-10-CM

## 2018-12-29 DIAGNOSIS — E1151 Type 2 diabetes mellitus with diabetic peripheral angiopathy without gangrene: Secondary | ICD-10-CM | POA: Diagnosis not present

## 2018-12-29 NOTE — Progress Notes (Signed)
Subjective: Maria Sharp is a 53 y.o. female patient with history of diabetes who presents to office today complaining of long,mildly painful nails  while ambulating in shoes; unable to trim. Patient states that her glucose has not been recorded in a while. Currently using diet modification to help. Patient reports her last A1c 7 and will see PCP again in Nov. Patient denies any new changes in medication or new problems. No other issues noted.  Patient Active Problem List   Diagnosis Date Noted  . Exposure to Covid-19 Virus 08/26/2018  . Migraines 09/02/2017  . DJD (degenerative joint disease) 09/02/2017  . Vertigo 12/05/2016  . Bilateral temporomandibular joint pain 12/05/2016  . Constipation   . Asthma, chronic 09/28/2014  . Gastroparesis   . Varicose veins 09/14/2013  . Neck pain on left side 06/14/2013  . Current non-adherence to medical treatment 06/14/2013  . Vitamin D deficiency 10/05/2012  . Leg cramps 10/05/2012  . Family history of diabetes mellitus 10/05/2012  . Type 2 diabetes mellitus, uncontrolled ([place]) 05/28/2012  . 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 ABDOMINAL PAIN, UNSPECIFIED SITE 05/18/2010  . CHEST PAIN, INTERMITTENT 05/12/2009  . Obesity 04/05/2009  . CARDIOMEGALY 04/05/2009  . PALPITATIONS 04/05/2009  . Type 2 diabetes mellitus with peripheral circulatory disorder ([place]) 03/15/2009  . BACK PAIN 03/09/2008  . Hyperlipidemia LDL goal <100 03/03/2008  . Essential hypertension 03/03/2008  . GERD 03/03/2008  . IRRITABLE BOWEL SYNDROME 03/03/2008  . RECTAL BLEEDING 03/03/2008   Current Outpatient Medications on File Prior to Visit  Medication Sig Dispense Refill  . albuterol (PROVENTIL HFA;VENTOLIN HFA) 108 (90 Base) MCG/ACT inhaler INHALE 2 PUFFS Q 4 TO 6 H PRN  0  . ARNUITY ELLIPTA 100 MCG/ACT AEPB INHALE BY MOUTH ONCE PER DAY  6  . azelastine (ASTELIN) 0.1 % nasal spray U 1 TO 2 SPRAYS IEN BID  6  . azithromycin (ZITHROMAX) 250 MG tablet TAKE 2 TABLETS  ON DAY 1AND 1 TABLET ONCE DAILY ON DAYS 2 TO 5    . benzonatate (TESSALON) 100 MG capsule Take 1-2 capsules (100-200 mg total) by mouth 3 (three) times daily as needed for cough. 40 capsule 0  . blood glucose meter kit and supplies KIT Dispense based on patient and insurance preference. Use up to four times daily as directed. (FOR ICD-9 250.00, 250.01). 1 each 0  . Calcium Carbonate-Vit D-Min (CALCIUM 1200 PO) Take by mouth.    . chlorthalidone (HYGROTON) 25 MG tablet Take 1 tablet (25 mg total) by mouth daily. 90 tablet 0  . Cholecalciferol (D3-1000 PO) Take 2,000 mg by mouth 2 (two) times a day.    . dicyclomine (BENTYL) 10 MG capsule Take 1 capsule (10 mg total) by mouth 4 (four) times daily -  before meals and at bedtime. 120 capsule 3  . doxycycline (VIBRAMYCIN) 100 MG capsule Take 1 capsule (100 mg total) by mouth 2 (two) times daily. 20 capsule 0  . EPINEPHrine (EPI-PEN) 0.3 mg/0.3 mL DEVI Inject 0.3 mg into the muscle once as needed. For severe allergic reaction    . ergocalciferol (VITAMIN D2) 1.25 MG (50000 UT) capsule Vitamin D2 1,250 mcg (50,000 unit) capsule    . glucose blood (ONE TOUCH ULTRA TEST) test strip USE TO TEST 4 TIMES A DAY    . hydrocortisone (ANUSOL-HC) 25 MG suppository Place 1 suppository (25 mg total) rectally every 12 (twelve) hours. 12 suppository 0  . ipratropium (ATROVENT) 0.03 % nasal spray USE 2 SPRAYS IN EACH NOSTRIL TWICE  DAILY 30 mL 2  . Lancets (ONETOUCH DELICA PLUS [id number]) MISC USE TO TEST UP TO 4 TIMES A DAY    . levocetirizine (XYZAL) 5 MG tablet TK 1 T PO QD IN THE EVE  6  . linaclotide (LINZESS) 72 MCG capsule Take 1 capsule (72 mcg total) by mouth daily before breakfast. 30 capsule 1  . metFORMIN (GLUCOPHAGE-XR) 500 MG 24 hr tablet TAKE 4 TABLETS BY MOUTH ONCE DAILY WITH FOOD 360 tablet 3  . montelukast (SINGULAIR) 10 MG tablet Take 10 mg by mouth at bedtime.     Marland Kitchen olmesartan (BENICAR) 20 MG tablet     . olmesartan (BENICAR) 40 MG tablet Take 1  tablet (40 mg total) by mouth daily. 90 tablet 3  . omeprazole (PRILOSEC) 40 MG capsule TAKE 1 CAPSULE BY MOUTH EVERY DAY 30 capsule 3  . predniSONE (DELTASONE) 10 MG tablet TAKE 4 TABLETS ON DAY 1 THEN 3 TABLETS ON DAY 2 THEN 2 TABLETS ON DAY 3 THEN 1 TABLEY ON DAY 4    . rosuvastatin (CRESTOR) 5 MG tablet Take 0.5 tablets (2.5 mg total) by mouth every other day. 45 tablet 1  . spironolactone (ALDACTONE) 25 MG tablet Take 1 tablet (25 mg total) by mouth 2 (two) times daily. 180 tablet 1  . SUPREP BOWEL PREP KIT 17.5-3.13-1.6 GM/177ML SOLN TAKE ONCE AS DIRECTED FOR 1 DOSE  0  . triamcinolone (NASACORT) 55 MCG/ACT AERO nasal inhaler Place 2 sprays into the nose daily. 1 Inhaler 12  . vitamin B-12 (CYANOCOBALAMIN) 1000 MCG tablet Take 1,000 mcg by mouth daily.    . vitamin E 400 UNIT capsule Take 400 Units by mouth daily.    . Vitamin Mixture (VITAMIN E COMPLETE PO) Take by mouth.     No current facility-administered medications on file prior to visit.    Allergies  Allergen Reactions  . Ace Inhibitors Anaphylaxis  . Shellfish Allergy Anaphylaxis and Other (See Comments)    Only shrimp  . Januvia [Sitagliptin] Other (See Comments)    Recurrent abdominal pain with elevated pancreas enzymes  . Latex Itching  . Lipitor [Atorvastatin]     No results found for this or any previous visit (from the past 2160 hour(s)).  Objective: General: Patient is awake, alert, and oriented x 3 and in no acute distress.  Integument: Skin is warm, dry and supple bilateral. Nails are tender, long, thickened and dystrophic with subungual debris, consistent with onychomycosis, 1-5 bilateral. No signs of infection. No open lesions or preulcerative lesions present bilateral. Remaining integument unremarkable.  Vasculature:  Dorsalis Pedis pulse 2/4 bilateral. Posterior Tibial pulse faint 1/4 bilateral.  Capillary fill time <3 sec 1-5 bilateral. + scant hair growth to the level of the digits. Temperature gradient  within normal limits. + varicosities present bilateral. Trace edema present bilateral.   Neurology: The patient has intact sensation measured with a 5.07/10g Semmes Weinstein Monofilament at all pedal sites bilateral.   Musculoskeletal: No symptomatic pedal deformities noted bilateral. Muscular strength 5/5 in all lower extremity muscular groups bilateral without pain on range of motion . No tenderness with calf compression bilateral.  Assessment and Plan: Problem List Items Addressed This Visit    None    Visit Diagnoses    Pain due to onychomycosis of nail    -  Primary   Type II diabetes mellitus with peripheral circulatory disorder ([place])         -Examined patient. -Re-Discussed and educated patient on diabetic foot care -Mechnically  debrided all nails 1-5 bilateral using sterile nail nipper and filed with dremel without incident  -Patient to return in 10 to 12 weeks for at risk foot care -Patient advised to call the office if any problems or questions arise in the meantime.  Landis Martins, DPM

## 2019-03-16 ENCOUNTER — Encounter: Payer: Self-pay | Admitting: Sports Medicine

## 2019-03-16 ENCOUNTER — Other Ambulatory Visit: Payer: Self-pay

## 2019-03-16 ENCOUNTER — Ambulatory Visit: Payer: 59 | Admitting: Sports Medicine

## 2019-03-16 DIAGNOSIS — M79676 Pain in unspecified toe(s): Secondary | ICD-10-CM

## 2019-03-16 DIAGNOSIS — B351 Tinea unguium: Secondary | ICD-10-CM

## 2019-03-16 DIAGNOSIS — I739 Peripheral vascular disease, unspecified: Secondary | ICD-10-CM

## 2019-03-16 DIAGNOSIS — E1151 Type 2 diabetes mellitus with diabetic peripheral angiopathy without gangrene: Secondary | ICD-10-CM

## 2019-03-16 DIAGNOSIS — E119 Type 2 diabetes mellitus without complications: Secondary | ICD-10-CM

## 2019-03-16 NOTE — Progress Notes (Signed)
Subjective: Maria Sharp is a 53 y.o. female patient with history of diabetes who returns to office today complaining of long,mildly painful nails  while ambulating in shoes; unable to trim. Patient states that her glucose has not been recorded in. Currently using diet modification to help her lose weight and manage her diabeites. Patient reports her last A1c 7. Patient denies any new changes in medication or new problems. No other issues noted.  Patient Active Problem List   Diagnosis Date Noted  . Exposure to COVID-19 virus 08/26/2018  . Migraines 09/02/2017  . DJD (degenerative joint disease) 09/02/2017  . Vertigo 12/05/2016  . Bilateral temporomandibular joint pain 12/05/2016  . Constipation   . Asthma, chronic 09/28/2014  . Gastroparesis   . Varicose veins 09/14/2013  . Neck pain on left side 06/14/2013  . Current non-adherence to medical treatment 06/14/2013  . Vitamin D deficiency 10/05/2012  . Leg cramps 10/05/2012  . Family history of diabetes mellitus 10/05/2012  . Type 2 diabetes mellitus, uncontrolled ([place]) 05/28/2012  . 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 ABDOMINAL PAIN, UNSPECIFIED SITE 05/18/2010  . CHEST PAIN, INTERMITTENT 05/12/2009  . Obesity 04/05/2009  . CARDIOMEGALY 04/05/2009  . PALPITATIONS 04/05/2009  . Type 2 diabetes mellitus with peripheral circulatory disorder ([place]) 03/15/2009  . BACK PAIN 03/09/2008  . Hyperlipidemia LDL goal <100 03/03/2008  . Essential hypertension 03/03/2008  . GERD 03/03/2008  . IRRITABLE BOWEL SYNDROME 03/03/2008  . RECTAL BLEEDING 03/03/2008   Current Outpatient Medications on File Prior to Visit  Medication Sig Dispense Refill  . albuterol (PROVENTIL HFA;VENTOLIN HFA) 108 (90 Base) MCG/ACT inhaler INHALE 2 PUFFS Q 4 TO 6 H PRN  0  . ARNUITY ELLIPTA 100 MCG/ACT AEPB INHALE BY MOUTH ONCE PER DAY  6  . azelastine (ASTELIN) 0.1 % nasal spray U 1 TO 2 SPRAYS IEN BID  6  . azithromycin (ZITHROMAX) 250 MG tablet TAKE 2 TABLETS  ON DAY 1AND 1 TABLET ONCE DAILY ON DAYS 2 TO 5    . benzonatate (TESSALON) 100 MG capsule Take 1-2 capsules (100-200 mg total) by mouth 3 (three) times daily as needed for cough. 40 capsule 0  . BIOTIN PO Take 1 tablet by mouth daily.    . blood glucose meter kit and supplies KIT Dispense based on patient and insurance preference. Use up to four times daily as directed. (FOR ICD-9 250.00, 250.01). 1 each 0  . Calcium Carbonate-Vit D-Min (CALCIUM 1200 PO) Take by mouth.    . chlorthalidone (HYGROTON) 25 MG tablet Take 1 tablet (25 mg total) by mouth daily. 90 tablet 0  . Cholecalciferol (D3-1000 PO) Take 2,000 mg by mouth 2 (two) times a day.    . dicyclomine (BENTYL) 10 MG capsule Take 1 capsule (10 mg total) by mouth 4 (four) times daily -  before meals and at bedtime. 120 capsule 3  . doxycycline (VIBRAMYCIN) 100 MG capsule Take 1 capsule (100 mg total) by mouth 2 (two) times daily. 20 capsule 0  . EPINEPHrine (EPI-PEN) 0.3 mg/0.3 mL DEVI Inject 0.3 mg into the muscle once as needed. For severe allergic reaction    . ergocalciferol (VITAMIN D2) 1.25 MG (50000 UT) capsule Vitamin D2 1,250 mcg (50,000 unit) capsule    . Ferrous Sulfate (IRON PO) Take 1 tablet by mouth daily.    Marland Kitchen glucose blood (ONE TOUCH ULTRA TEST) test strip USE TO TEST 4 TIMES A DAY    . hydrocortisone (ANUSOL-HC) 25 MG suppository Place 1 suppository (25 mg  total) rectally every 12 (twelve) hours. 12 suppository 0  . ipratropium (ATROVENT) 0.03 % nasal spray USE 2 SPRAYS IN EACH NOSTRIL TWICE DAILY 30 mL 2  . Lancets (ONETOUCH DELICA PLUS [id number]) MISC USE TO TEST UP TO 4 TIMES A DAY    . levocetirizine (XYZAL) 5 MG tablet TK 1 T PO QD IN THE EVE  6  . linaclotide (LINZESS) 72 MCG capsule Take 1 capsule (72 mcg total) by mouth daily before breakfast. 30 capsule 1  . metFORMIN (GLUCOPHAGE-XR) 500 MG 24 hr tablet TAKE 4 TABLETS BY MOUTH ONCE DAILY WITH FOOD 360 tablet 3  . montelukast (SINGULAIR) 10 MG tablet Take 10 mg by  mouth at bedtime.     . Multiple Vitamins-Minerals (ZINC PO) Take 1 tablet by mouth daily.    Marland Kitchen olmesartan (BENICAR) 20 MG tablet     . olmesartan (BENICAR) 40 MG tablet Take 1 tablet (40 mg total) by mouth daily. 90 tablet 3  . omeprazole (PRILOSEC) 40 MG capsule TAKE 1 CAPSULE BY MOUTH EVERY DAY 30 capsule 3  . predniSONE (DELTASONE) 10 MG tablet TAKE 4 TABLETS ON DAY 1 THEN 3 TABLETS ON DAY 2 THEN 2 TABLETS ON DAY 3 THEN 1 TABLEY ON DAY 4    . rosuvastatin (CRESTOR) 5 MG tablet Take 0.5 tablets (2.5 mg total) by mouth every other day. 45 tablet 1  . spironolactone (ALDACTONE) 25 MG tablet Take 1 tablet (25 mg total) by mouth 2 (two) times daily. 180 tablet 1  . triamcinolone (NASACORT) 55 MCG/ACT AERO nasal inhaler Place 2 sprays into the nose daily. 1 Inhaler 12  . vitamin B-12 (CYANOCOBALAMIN) 1000 MCG tablet Take 1,000 mcg by mouth daily.    . vitamin E 400 UNIT capsule Take 400 Units by mouth daily.    . Vitamin Mixture (VITAMIN E COMPLETE PO) Take by mouth.     No current facility-administered medications on file prior to visit.    Allergies  Allergen Reactions  . Ace Inhibitors Anaphylaxis  . Shellfish Allergy Anaphylaxis and Other (See Comments)    Only shrimp  . Januvia [Sitagliptin] Other (See Comments)    Recurrent abdominal pain with elevated pancreas enzymes  . Latex Itching  . Lipitor [Atorvastatin]     No results found for this or any previous visit (from the past 2160 hour(s)).  Objective: General: Patient is awake, alert, and oriented x 3 and in no acute distress.  Integument: Skin is warm, dry and supple bilateral. Nails are tender, long, thickened and dystrophic with subungual debris, consistent with onychomycosis, 1-5 bilateral. No signs of infection. No open lesions or preulcerative lesions present bilateral. Remaining integument unremarkable.  Vasculature:  Dorsalis Pedis pulse 2/4 bilateral. Posterior Tibial pulse faint 1/4 bilateral.  Capillary fill time <3  sec 1-5 bilateral. + scant hair growth to the level of the digits. Temperature gradient within normal limits. + varicosities present bilateral. Trace edema present bilateral.   Neurology: The patient has intact sensation measured with a 5.07/10g Semmes Weinstein Monofilament at all pedal sites bilateral.   Musculoskeletal: No symptomatic pedal deformities noted bilateral. No pain to plantar fascia bilateral. + Pes planus. Muscular strength 5/5 in all lower extremity muscular groups bilateral without pain on range of motion. No tenderness with calf compression bilateral.  Assessment and Plan: Problem List Items Addressed This Visit    None    Visit Diagnoses    Pain due to onychomycosis of nail    -  Primary  Type II diabetes mellitus with peripheral circulatory disorder (HCC)       Diabetes mellitus without complication (HCC)       PVD (peripheral vascular disease) ([place])         -Examined patient. -Re-Discussed and educated patient on diabetic foot care -Mechnically debrided all nails 1-5 bilateral using sterile nail nipper and filed with dremel without incident  -Orthotics were dispensed to patient for previous history of plantar fasciitis and was cut to fit into tennis shoes by Liliane Channel -Patient to return in 10 to 12 weeks for at risk foot care -Patient advised to call the office if any problems or questions arise in the meantime.  Landis Martins, DPM

## 2019-04-01 ENCOUNTER — Other Ambulatory Visit: Payer: Self-pay

## 2019-04-01 ENCOUNTER — Ambulatory Visit (INDEPENDENT_AMBULATORY_CARE_PROVIDER_SITE_OTHER): Payer: 59 | Admitting: Gastroenterology

## 2019-04-01 DIAGNOSIS — K582 Mixed irritable bowel syndrome: Secondary | ICD-10-CM

## 2019-04-01 DIAGNOSIS — R140 Abdominal distension (gaseous): Secondary | ICD-10-CM | POA: Diagnosis not present

## 2019-04-01 DIAGNOSIS — E739 Lactose intolerance, unspecified: Secondary | ICD-10-CM

## 2019-04-01 DIAGNOSIS — K219 Gastro-esophageal reflux disease without esophagitis: Secondary | ICD-10-CM | POA: Diagnosis not present

## 2019-04-01 DIAGNOSIS — R109 Unspecified abdominal pain: Secondary | ICD-10-CM

## 2019-04-01 NOTE — Progress Notes (Signed)
Maria Sharp    [id number]    1966/04/21  Primary Care Physician:Shaw, Laurey Arrow, MD  Referring Physician: Shawnee Knapp, MD [address] [place],  [place] [address]  This service was provided via  telemedicine due to [place] 19 pandemic.  I connected with@ on 04/01/19 at  9:50 AM EST by a video enabled telemedicine application and verified that I am speaking with the correct person using two identifiers.  Patient location: Home Provider location: Office   I discussed the limitations, risks, security and privacy concerns of performing an evaluation and management service by video enabled telemedicine application and the availability of in person appointments. I also discussed with the patient that there may be a patient responsible charge related to this service. The patient expressed understanding and agreed to proceed.   The persons participating in this telemedicine service were myself and the patient  Interactive audio and video telecommunications were attempted between this provider and patient, however failed, as patient did not have access to video capability. We continued and completed visit with audio only.    Chief complaint: IBS  HPI:  53 year old female with history of chronic irritable bowel syndrome, constipation alternating with diarrhea.  She continues to have irregular bowel habits. She is taking Linzess 72 mcg daily.  She has 1-2 bowel movements on most days with occasional semiformed stool. She continues to have intermittent abdominal cramping. She is wondering why her bowel movements are so large and she has to go so many times a day even when it is well formed. Denies any vomiting, melena, abdominal pain, blood in stool or blood per rectum.  No unintentional weight loss or loss of appetite. He has significant abdominal bloating, does eat cheese and dairy products.  She has not noticed any particular food worsening or improving her symptoms. Overall  her symptoms are unchanged.  Colonoscopy March 26, 2018 with removal of 2 small subcentimeter sessile tubular adenomatous polyps and internal hemorrhoids   Outpatient Encounter Medications as of 04/01/2019  Medication Sig  . albuterol (PROVENTIL HFA;VENTOLIN HFA) 108 (90 Base) MCG/ACT inhaler INHALE 2 PUFFS Q 4 TO 6 H PRN  . ARNUITY ELLIPTA 100 MCG/ACT AEPB INHALE BY MOUTH ONCE PER DAY  . azelastine (ASTELIN) 0.1 % nasal spray U 1 TO 2 SPRAYS IEN BID  . azithromycin (ZITHROMAX) 250 MG tablet TAKE 2 TABLETS ON DAY 1AND 1 TABLET ONCE DAILY ON DAYS 2 TO 5  . benzonatate (TESSALON) 100 MG capsule Take 1-2 capsules (100-200 mg total) by mouth 3 (three) times daily as needed for cough.  Marland Kitchen BIOTIN PO Take 1 tablet by mouth daily.  . blood glucose meter kit and supplies KIT Dispense based on patient and insurance preference. Use up to four times daily as directed. (FOR ICD-9 250.00, 250.01).  . Calcium Carbonate-Vit D-Min (CALCIUM 1200 PO) Take by mouth.  . chlorthalidone (HYGROTON) 25 MG tablet Take 1 tablet (25 mg total) by mouth daily.  . Cholecalciferol (D3-1000 PO) Take 2,000 mg by mouth 2 (two) times a day.  . dicyclomine (BENTYL) 10 MG capsule Take 1 capsule (10 mg total) by mouth 4 (four) times daily -  before meals and at bedtime.  Marland Kitchen doxycycline (VIBRAMYCIN) 100 MG capsule Take 1 capsule (100 mg total) by mouth 2 (two) times daily.  Marland Kitchen EPINEPHrine (EPI-PEN) 0.3 mg/0.3 mL DEVI Inject 0.3 mg into the muscle once as needed. For severe allergic reaction  . ergocalciferol (  VITAMIN D2) 1.25 MG (50000 UT) capsule Vitamin D2 1,250 mcg (50,000 unit) capsule  . Ferrous Sulfate (IRON PO) Take 1 tablet by mouth daily.  Marland Kitchen glucose blood (ONE TOUCH ULTRA TEST) test strip USE TO TEST 4 TIMES A DAY  . hydrocortisone (ANUSOL-HC) 25 MG suppository Place 1 suppository (25 mg total) rectally every 12 (twelve) hours.  Marland Kitchen ipratropium (ATROVENT) 0.03 % nasal spray USE 2 SPRAYS IN EACH NOSTRIL TWICE DAILY  .  Lancets (ONETOUCH DELICA PLUS [id number]) MISC USE TO TEST UP TO 4 TIMES A DAY  . levocetirizine (XYZAL) 5 MG tablet TK 1 T PO QD IN THE EVE  . linaclotide (LINZESS) 72 MCG capsule Take 1 capsule (72 mcg total) by mouth daily before breakfast.  . metFORMIN (GLUCOPHAGE-XR) 500 MG 24 hr tablet TAKE 4 TABLETS BY MOUTH ONCE DAILY WITH FOOD  . montelukast (SINGULAIR) 10 MG tablet Take 10 mg by mouth at bedtime.   . Multiple Vitamins-Minerals (ZINC PO) Take 1 tablet by mouth daily.  Marland Kitchen olmesartan (BENICAR) 20 MG tablet   . olmesartan (BENICAR) 40 MG tablet Take 1 tablet (40 mg total) by mouth daily.  Marland Kitchen omeprazole (PRILOSEC) 40 MG capsule TAKE 1 CAPSULE BY MOUTH EVERY DAY  . predniSONE (DELTASONE) 10 MG tablet TAKE 4 TABLETS ON DAY 1 THEN 3 TABLETS ON DAY 2 THEN 2 TABLETS ON DAY 3 THEN 1 TABLEY ON DAY 4  . rosuvastatin (CRESTOR) 5 MG tablet Take 0.5 tablets (2.5 mg total) by mouth every other day.  . spironolactone (ALDACTONE) 25 MG tablet Take 1 tablet (25 mg total) by mouth 2 (two) times daily.  Marland Kitchen triamcinolone (NASACORT) 55 MCG/ACT AERO nasal inhaler Place 2 sprays into the nose daily.  . vitamin B-12 (CYANOCOBALAMIN) 1000 MCG tablet Take 1,000 mcg by mouth daily.  . vitamin E 400 UNIT capsule Take 400 Units by mouth daily.  . Vitamin Mixture (VITAMIN E COMPLETE PO) Take by mouth.   No facility-administered encounter medications on file as of 04/01/2019.    Allergies as of 04/01/2019 - Review Complete 03/16/2019  Allergen Reaction Noted  . Ace inhibitors Anaphylaxis 03/03/2008  . Shellfish allergy Anaphylaxis and Other (See Comments) 05/25/2011  . Januvia [sitagliptin] Other (See Comments) 10/05/2012  . Latex Itching   . Lipitor [atorvastatin]  02/10/2013    Past Medical History:  Diagnosis Date  . Allergy   . Asthma   . Diabetes mellitus   . Fatty liver   . Gastritis   . Gastroparesis    study 11 14  nl abd [place]  . GERD (gastroesophageal reflux disease)   . High cholesterol   .  History of varicella   . Hypertension   . IBS (irritable bowel syndrome)     Past Surgical History:  Procedure Laterality Date  . ANAL RECTAL MANOMETRY N/A 04/03/2015   Procedure: ANO RECTAL MANOMETRY;  Surgeon: Mauri Pole, MD;  Location: [facility];  Service: Endoscopy;  Laterality: N/A;  . CHOLECYSTECTOMY    . WISDOM TOOTH EXTRACTION      Family History  Problem Relation Age of Onset  . Prostate cancer Father   . Diabetes Father   . Hypertension Father   . Diabetes Mother   . Heart disease Mother   . Arthritis Mother   . Hyperlipidemia Mother   . Hypertension Mother   . Parkinson's disease Mother   . Thyroid disease Mother   . Diabetes Paternal Grandmother   . Arthritis Paternal Grandmother   . Hyperlipidemia Paternal  Grandmother   . Heart disease Paternal Grandmother   . Dementia Paternal Grandmother   . Hypertension Paternal Grandmother   . Hyperlipidemia Maternal Grandmother   . Hypertension Maternal Grandmother   . Hyperlipidemia Maternal Grandfather   . Hypertension Maternal Grandfather   . Hyperlipidemia Paternal Grandfather   . Hypertension Paternal Grandfather   . Diabetes Paternal Grandfather   . Cholelithiasis Paternal Grandfather   . Colon cancer Neg Hx   . Esophageal cancer Neg Hx   . Stomach cancer Neg Hx   . Rectal cancer Neg Hx     Social History   Socioeconomic History  . Marital status: Single    Spouse name: Not on file  . Number of children: 0  . Years of education: Not on file  . Highest education level: Not on file  Occupational History  . Occupation: Post office  Tobacco Use  . Smoking status: Never Smoker  . Smokeless tobacco: Never Used  Substance and Sexual Activity  . Alcohol use: No  . Drug use: No  . Sexual activity: Not on file  Other Topics Concern  . Not on file  Social History Narrative   Usually # of hours of sleep per night: 6   3 of people living at your residence? 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   Irregular sleep 7:30 to 11 or 12 and then 4 to 6:30 before she goes to work   Has attended college.   Originally from [place]   Abnormal pap remote last 2012    Social Determinants of Health   Financial Resource Strain:   . Difficulty of Paying Living Expenses: Not on file  Food Insecurity:   . Worried About Charity fundraiser in the Last Year: Not on file  . Ran Out of Food in the Last Year: Not on file  Transportation Needs:   . Lack of Transportation (Medical): Not on file  . Lack of Transportation (Non-Medical): Not on file  Physical Activity:   . Days of Exercise per Week: Not on file  . Minutes of Exercise per Session: Not on file  Stress:   . Feeling of Stress : Not on file  Social Connections:   . Frequency of Communication with Friends and Family: Not on file  . Frequency of Social Gatherings with Friends and Family: Not on file  . Attends Religious Services: Not on file  . Active Member of Clubs or Organizations: Not on file  . Attends Archivist Meetings: Not on file  . Marital Status: Not on file  Intimate Partner Violence:   . Fear of Current or Ex-Partner: Not on file  . Emotionally Abused: Not on file  . Physically Abused: Not on file  . Sexually Abused: Not on file      Review of systems: Review of Systems as per HPI All other systems reviewed and are negative.   Observations/Objective:   Data Reviewed:  Reviewed labs, radiology imaging, old records and pertinent past GI work up   Assessment and Plan/Recommendations:  53 year old female with chronic irritable bowel syndrome with alternating constipation and diarrhea Continue Linzess 72 mcg Avoid high-fiber and high calorie diet Trial of lactose-free diet Benefiber 1 tablespoon twice daily with meals  Intermittent abdominal cramping: Increase dicyclomine  to 20 mg every 6 hours as needed  GERD: Continue antireflux measures and Protonix     I discussed the assessment and treatment plan with the patient. The patient was provided an opportunity to ask questions and all were answered. The patient agreed with the plan and demonstrated an understanding of the instructions.   The patient was advised to call back or seek an in-person evaluation if the symptoms worsen or if the condition fails to improve as anticipated.  I provided 23 minutes of non-face-to-face time during this encounter.   Harl Bowie, MD   CC: Shawnee Knapp, MD

## 2019-04-01 NOTE — Patient Instructions (Addendum)
Continue Linzess 72 mcg daily  Benefiber 1 tablespoon twice daily with meals  Increase dicyclomine to 20 mg every 6 hours as needed X 120 tablets  Trial of lactose-free diet for 1 week  Continue omeprazole and antireflux measures  Return in 2 to 3 months for office visit   Lactose-Free Diet, Adult If you have lactose intolerance, you are not able to digest lactose. Lactose is a natural sugar found mainly in dairy milk and dairy products. You may need to avoid all foods and beverages that contain lactose. A lactose-free diet can help you do this. Which foods have lactose? Lactose is found in dairy milk and dairy products, such as:  Yogurt.  Cheese.  Butter.  Margarine.  Sour cream.  Cream.  Whipped toppings and nondairy creamers.  Ice cream and other dairy-based desserts. Lactose is also found in foods or products made with dairy milk or milk ingredients. To find out whether a food contains dairy milk or a milk ingredient, look at the ingredients list. Avoid foods with the statement "May contain milk" and foods that contain:  Milk powder.  Whey.  Curd.  Caseinate.  Lactose.  Lactalbumin.  Lactoglobulin. What are alternatives to dairy milk and foods made with milk products?  Lactose-free milk.  Soy milk with added calcium and vitamin D.  Almond milk, coconut milk, rice milk, or other nondairy milk alternatives with added calcium and vitamin D. Note that these are low in protein.  Soy products, such as soy yogurt, soy cheese, soy ice cream, and soy-based sour cream.  Other nut milk products, such as almond yogurt, almond cheese, cashew yogurt, cashew cheese, cashew ice cream, coconut yogurt, and coconut ice cream. What are tips for following this plan?  Do not consume foods, beverages, vitamins, minerals, or medicines containing lactose. Read ingredient lists carefully.  Look for the words "lactose-free" on labels.  Use lactase enzyme drops or tablets as  directed by your health care provider.  Use lactose-free milk or a milk alternative, such as soy milk or almond milk, for drinking and cooking.  Make sure you get enough calcium and vitamin D in your diet. A lactose-free eating plan can be lacking in these important nutrients.  Take calcium and vitamin D supplements as directed by your health care provider. Talk to your health care provider about supplements if you are not able to get enough calcium and vitamin D from food. What foods can I eat?  Fruits All fresh, canned, frozen, or dried fruits that are not processed with lactose. Vegetables All fresh, frozen, and canned vegetables without cheese, cream, or butter sauces. Grains Any that are not made with dairy milk or dairy products. Meats and other proteins Any meat, fish, poultry, and other protein sources that are not made with dairy milk or dairy products. Soy cheese and yogurt. Fats and oils Any that are not made with dairy milk or dairy products. Beverages Lactose-free milk. Soy, rice, or almond milk with added calcium and vitamin D. Fruit and vegetable juices. Sweets and desserts Any that are not made with dairy milk or dairy products. Seasonings and condiments Any that are not made with dairy milk or dairy products. Calcium Calcium is found in many foods that contain lactose and is important for bone health. The amount of calcium you need depends on your age:  Adults younger than 50 years: 1,000 mg of calcium a day.  Adults older than 50 years: 1,200 mg of calcium a day. If you are  not getting enough calcium, you may get it from other sources, including:  Orange juice with calcium added. There are 300-350 mg of calcium in 1 cup of orange juice.  Calcium-fortified soy milk. There are 300-400 mg of calcium in 1 cup of calcium-fortified soy milk.  Calcium-fortified rice or almond milk. There are 300 mg of calcium in 1 cup of calcium-fortified rice or almond  milk.  Calcium-fortified breakfast cereals. There are 100-1,000 mg of calcium in calcium-fortified breakfast cereals.  Spinach, cooked. There are 145 mg of calcium in  cup of cooked spinach.  Edamame, cooked. There are 130 mg of calcium in  cup of cooked edamame.  Collard greens, cooked. There are 125 mg of calcium in  cup of cooked collard greens.  Kale, frozen or cooked. There are 90 mg of calcium in  cup of cooked or frozen kale.  Almonds. There are 95 mg of calcium in  cup of almonds.  Broccoli, cooked. There are 60 mg of calcium in 1 cup of cooked broccoli. The items listed above may not be a complete list of recommended foods and beverages. Contact a dietitian for more options. What foods are not recommended? Fruits None, unless they are made with dairy milk or dairy products. Vegetables None, unless they are made with dairy milk or dairy products. Grains Any grains that are made with dairy milk or dairy products. Meats and other proteins None, unless they are made with dairy milk or dairy products. Dairy All dairy products, including milk, goat's milk, buttermilk, kefir, acidophilus milk, flavored milk, evaporated milk, condensed milk, dulce de [place], eggnog, yogurt, cheese, and cheese spreads. Fats and oils Any that are made with milk or milk products. Margarines and salad dressings that contain milk or cheese. Cream. Half and half. Cream cheese. Sour cream. Chip dips made with sour cream or yogurt. Beverages Hot chocolate. Cocoa with lactose. Instant iced teas. Powdered fruit drinks. Smoothies made with dairy milk or yogurt. Sweets and desserts Any that are made with milk or milk products. Seasonings and condiments Chewing gum that has lactose. Spice blends if they contain lactose. Artificial sweeteners that contain lactose. Nondairy creamers. The items listed above may not be a complete list of foods and beverages to avoid. Contact a dietitian for more  information. Summary  If you are lactose intolerant, it means that you have a hard time digesting lactose, a natural sugar found in milk and milk products.  Following a lactose-free diet can help you manage this condition.  Calcium is important for bone health and is found in many foods that contain lactose. Talk with your health care provider about other sources of calcium. This information is not intended to replace advice given to you by your health care provider. Make sure you discuss any questions you have with your health care provider. Document Released: 09/28/2001 Document Revised: 05/06/2017 Document Reviewed: 05/06/2017 Elsevier Patient Education  [place].  I appreciate the  opportunity to care for you  Thank You   Harl Bowie , MD

## 2019-04-02 ENCOUNTER — Encounter: Payer: Self-pay | Admitting: Gastroenterology

## 2019-05-30 ENCOUNTER — Other Ambulatory Visit: Payer: Self-pay | Admitting: Gastroenterology

## 2019-06-01 ENCOUNTER — Ambulatory Visit: Payer: 59 | Admitting: Sports Medicine

## 2019-06-01 ENCOUNTER — Encounter: Payer: Self-pay | Admitting: Sports Medicine

## 2019-06-01 ENCOUNTER — Other Ambulatory Visit: Payer: Self-pay

## 2019-06-01 VITALS — Temp 96.6°F

## 2019-06-01 DIAGNOSIS — E1151 Type 2 diabetes mellitus with diabetic peripheral angiopathy without gangrene: Secondary | ICD-10-CM | POA: Diagnosis not present

## 2019-06-01 DIAGNOSIS — M79609 Pain in unspecified limb: Secondary | ICD-10-CM | POA: Diagnosis not present

## 2019-06-01 DIAGNOSIS — B351 Tinea unguium: Secondary | ICD-10-CM

## 2019-06-01 NOTE — Progress Notes (Signed)
Subjective: Maria Sharp is a 54 y.o. female patient with history of diabetes who returns to office today complaining of long,mildly painful nails  while ambulating in shoes; unable to trim. Patient states that her glucose has not been recorded, DIET CONTROLLED DIABETIC, Last a1c was checked yesterday at PCP, around 6. Patient denies any new changes in medication or new problems. No other issues noted.  Patient Active Problem List   Diagnosis Date Noted  . Exposure to COVID-19 virus 08/26/2018  . Migraines 09/02/2017  . DJD (degenerative joint disease) 09/02/2017  . Vertigo 12/05/2016  . Bilateral temporomandibular joint pain 12/05/2016  . Constipation   . Asthma, chronic 09/28/2014  . Gastroparesis   . Varicose veins 09/14/2013  . Neck pain on left side 06/14/2013  . Current non-adherence to medical treatment 06/14/2013  . Vitamin D deficiency 10/05/2012  . Leg cramps 10/05/2012  . Family history of diabetes mellitus 10/05/2012  . Type 2 diabetes mellitus, uncontrolled ([place]) 05/28/2012  . 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 ABDOMINAL PAIN, UNSPECIFIED SITE 05/18/2010  . CHEST PAIN, INTERMITTENT 05/12/2009  . Obesity 04/05/2009  . CARDIOMEGALY 04/05/2009  . PALPITATIONS 04/05/2009  . Type 2 diabetes mellitus with peripheral circulatory disorder ([place]) 03/15/2009  . BACK PAIN 03/09/2008  . Hyperlipidemia LDL goal <100 03/03/2008  . Essential hypertension 03/03/2008  . GERD 03/03/2008  . IRRITABLE BOWEL SYNDROME 03/03/2008  . RECTAL BLEEDING 03/03/2008   Current Outpatient Medications on File Prior to Visit  Medication Sig Dispense Refill  . albuterol (PROVENTIL HFA;VENTOLIN HFA) 108 (90 Base) MCG/ACT inhaler INHALE 2 PUFFS Q 4 TO 6 H PRN  0  . ARNUITY ELLIPTA 100 MCG/ACT AEPB INHALE BY MOUTH ONCE PER DAY  6  . azelastine (ASTELIN) 0.1 % nasal spray U 1 TO 2 SPRAYS IEN BID  6  . azithromycin (ZITHROMAX) 250 MG tablet TAKE 2 TABLETS ON DAY 1AND 1 TABLET ONCE DAILY ON DAYS 2  TO 5    . benzonatate (TESSALON) 100 MG capsule Take 1-2 capsules (100-200 mg total) by mouth 3 (three) times daily as needed for cough. 40 capsule 0  . BIOTIN PO Take 1 tablet by mouth daily.    . blood glucose meter kit and supplies KIT Dispense based on patient and insurance preference. Use up to four times daily as directed. (FOR ICD-9 250.00, 250.01). 1 each 0  . Calcium Carbonate-Vit D-Min (CALCIUM 1200 PO) Take by mouth.    . chlorthalidone (HYGROTON) 25 MG tablet Take 1 tablet (25 mg total) by mouth daily. 90 tablet 0  . Cholecalciferol (D3-1000 PO) Take 2,000 mg by mouth 2 (two) times a day.    . dicyclomine (BENTYL) 10 MG capsule Take 1 capsule (10 mg total) by mouth 4 (four) times daily -  before meals and at bedtime. 120 capsule 3  . doxycycline (VIBRAMYCIN) 100 MG capsule Take 1 capsule (100 mg total) by mouth 2 (two) times daily. 20 capsule 0  . EPINEPHrine (EPI-PEN) 0.3 mg/0.3 mL DEVI Inject 0.3 mg into the muscle once as needed. For severe allergic reaction    . ergocalciferol (VITAMIN D2) 1.25 MG (50000 UT) capsule Vitamin D2 1,250 mcg (50,000 unit) capsule    . Ferrous Sulfate (IRON PO) Take 1 tablet by mouth daily.    Marland Kitchen glucose blood (ONE TOUCH ULTRA TEST) test strip USE TO TEST 4 TIMES A DAY    . hydrocortisone (ANUSOL-HC) 25 MG suppository Place 1 suppository (25 mg total) rectally every 12 (twelve) hours. 12 suppository  0  . ipratropium (ATROVENT) 0.03 % nasal spray USE 2 SPRAYS IN EACH NOSTRIL TWICE DAILY 30 mL 2  . Lancets (ONETOUCH DELICA PLUS [id number]) MISC USE TO TEST UP TO 4 TIMES A DAY    . levocetirizine (XYZAL) 5 MG tablet TK 1 T PO QD IN THE EVE  6  . linaclotide (LINZESS) 72 MCG capsule Take 1 capsule (72 mcg total) by mouth daily before breakfast. 30 capsule 1  . metFORMIN (GLUCOPHAGE-XR) 500 MG 24 hr tablet TAKE 4 TABLETS BY MOUTH ONCE DAILY WITH FOOD 360 tablet 3  . montelukast (SINGULAIR) 10 MG tablet Take 10 mg by mouth at bedtime.     . Multiple  Vitamins-Minerals (ZINC PO) Take 1 tablet by mouth daily.    Marland Kitchen olmesartan (BENICAR) 20 MG tablet     . olmesartan (BENICAR) 40 MG tablet Take 1 tablet (40 mg total) by mouth daily. 90 tablet 3  . omeprazole (PRILOSEC) 40 MG capsule TAKE 1 CAPSULE BY MOUTH EVERY DAY 30 capsule 3  . predniSONE (DELTASONE) 10 MG tablet TAKE 4 TABLETS ON DAY 1 THEN 3 TABLETS ON DAY 2 THEN 2 TABLETS ON DAY 3 THEN 1 TABLEY ON DAY 4    . rosuvastatin (CRESTOR) 5 MG tablet Take 0.5 tablets (2.5 mg total) by mouth every other day. 45 tablet 1  . spironolactone (ALDACTONE) 25 MG tablet Take 1 tablet (25 mg total) by mouth 2 (two) times daily. 180 tablet 1  . triamcinolone (NASACORT) 55 MCG/ACT AERO nasal inhaler Place 2 sprays into the nose daily. 1 Inhaler 12  . vitamin B-12 (CYANOCOBALAMIN) 1000 MCG tablet Take 1,000 mcg by mouth daily.    . vitamin E 400 UNIT capsule Take 400 Units by mouth daily.    . Vitamin Mixture (VITAMIN E COMPLETE PO) Take by mouth.     No current facility-administered medications on file prior to visit.   Allergies  Allergen Reactions  . Ace Inhibitors Anaphylaxis  . Shellfish Allergy Anaphylaxis and Other (See Comments)    Only shrimp  . Januvia [Sitagliptin] Other (See Comments)    Recurrent abdominal pain with elevated pancreas enzymes  . Latex Itching  . Lipitor [Atorvastatin]     No results found for this or any previous visit (from the past 2160 hour(s)).  Objective: General: Patient is awake, alert, and oriented x 3 and in no acute distress.  Integument: Skin is warm, dry and supple bilateral. Nails are tender, long, thickened and dystrophic with subungual debris, consistent with onychomycosis, 1-5 bilateral. No signs of infection. No open lesions or preulcerative lesions present bilateral. Remaining integument unremarkable.  Vasculature:  Dorsalis Pedis pulse 2/4 bilateral. Posterior Tibial pulse faint 1/4 bilateral. Capillary fill time <3 sec 1-5 bilateral. + scant hair  growth to the level of the digits. Temperature gradient within normal limits. + varicosities present bilateral. Trace edema present bilateral.   Neurology: The patient has intact sensation measured with a 5.07/10g Semmes Weinstein Monofilament at all pedal sites bilateral.   Musculoskeletal: No symptomatic pedal deformities noted bilateral. No pain to plantar fascia bilateral. + Pes planus. Muscular strength 5/5 in all lower extremity muscular groups bilateral without pain on range of motion. No tenderness with calf compression bilateral.  Assessment and Plan: Problem List Items Addressed This Visit    None    Visit Diagnoses    Pain due to onychomycosis of nail    -  Primary   Type II diabetes mellitus with peripheral circulatory disorder ([place])         -  Examined patient. -Re-Discussed and educated patient on diabetic foot care -Mechnically debrided all nails 1-5 bilateral using sterile nail nipper and filed with dremel without incident  -Patient to return in 33 to 12 weeks for at risk foot care -Patient advised to call the office if any problems or questions arise in the meantime.  Landis Martins, DPM

## 2019-08-10 ENCOUNTER — Other Ambulatory Visit: Payer: Self-pay

## 2019-08-10 ENCOUNTER — Encounter: Payer: Self-pay | Admitting: Sports Medicine

## 2019-08-10 ENCOUNTER — Ambulatory Visit: Payer: 59 | Admitting: Sports Medicine

## 2019-08-10 VITALS — Temp 97.3°F

## 2019-08-10 DIAGNOSIS — B351 Tinea unguium: Secondary | ICD-10-CM | POA: Diagnosis not present

## 2019-08-10 DIAGNOSIS — M79676 Pain in unspecified toe(s): Secondary | ICD-10-CM | POA: Diagnosis not present

## 2019-08-10 DIAGNOSIS — E1151 Type 2 diabetes mellitus with diabetic peripheral angiopathy without gangrene: Secondary | ICD-10-CM

## 2019-08-10 NOTE — Progress Notes (Signed)
Subjective: Maria Sharp is a 54 y.o. female patient with history of diabetes who returns to office today complaining of long,mildly painful nails  while ambulating in shoes; unable to trim. Patient states that her glucose has not been recorded, DIET CONTROLLED DIABETIC and not on meds, Last a1c 5.9. No other issues noted.  Patient Active Problem List   Diagnosis Date Noted  . Exposure to COVID-19 virus 08/26/2018  . Migraines 09/02/2017  . DJD (degenerative joint disease) 09/02/2017  . Vertigo 12/05/2016  . Bilateral temporomandibular joint pain 12/05/2016  . Constipation   . Asthma, chronic 09/28/2014  . Gastroparesis   . Varicose veins 09/14/2013  . Neck pain on left side 06/14/2013  . Current non-adherence to medical treatment 06/14/2013  . Vitamin D deficiency 10/05/2012  . Leg cramps 10/05/2012  . Family history of diabetes mellitus 10/05/2012  . Type 2 diabetes mellitus, uncontrolled ([place]) 05/28/2012  . 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 ABDOMINAL PAIN, UNSPECIFIED SITE 05/18/2010  . CHEST PAIN, INTERMITTENT 05/12/2009  . Obesity 04/05/2009  . CARDIOMEGALY 04/05/2009  . PALPITATIONS 04/05/2009  . Type 2 diabetes mellitus with peripheral circulatory disorder ([place]) 03/15/2009  . BACK PAIN 03/09/2008  . Hyperlipidemia LDL goal <100 03/03/2008  . Essential hypertension 03/03/2008  . GERD 03/03/2008  . IRRITABLE BOWEL SYNDROME 03/03/2008  . RECTAL BLEEDING 03/03/2008   Current Outpatient Medications on File Prior to Visit  Medication Sig Dispense Refill  . albuterol (PROVENTIL HFA;VENTOLIN HFA) 108 (90 Base) MCG/ACT inhaler INHALE 2 PUFFS Q 4 TO 6 H PRN  0  . ARNUITY ELLIPTA 100 MCG/ACT AEPB INHALE BY MOUTH ONCE PER DAY  6  . azelastine (ASTELIN) 0.1 % nasal spray U 1 TO 2 SPRAYS IEN BID  6  . benzonatate (TESSALON) 100 MG capsule Take 1-2 capsules (100-200 mg total) by mouth 3 (three) times daily as needed for cough. 40 capsule 0  . BIOTIN PO Take 1 tablet by mouth  daily.    . blood glucose meter kit and supplies KIT Dispense based on patient and insurance preference. Use up to four times daily as directed. (FOR ICD-9 250.00, 250.01). 1 each 0  . Calcium Carbonate-Vit D-Min (CALCIUM 1200 PO) Take by mouth.    . chlorthalidone (HYGROTON) 25 MG tablet Take 1 tablet (25 mg total) by mouth daily. 90 tablet 0  . Cholecalciferol (D3-1000 PO) Take 2,000 mg by mouth 2 (two) times a day.    Marland Kitchen EPINEPHrine (EPI-PEN) 0.3 mg/0.3 mL DEVI Inject 0.3 mg into the muscle once as needed. For severe allergic reaction    . ergocalciferol (VITAMIN D2) 1.25 MG (50000 UT) capsule Vitamin D2 1,250 mcg (50,000 unit) capsule    . Ferrous Sulfate (IRON PO) Take 1 tablet by mouth daily.    Marland Kitchen glucose blood (ONE TOUCH ULTRA TEST) test strip USE TO TEST 4 TIMES A DAY    . hydrocortisone (ANUSOL-HC) 25 MG suppository Place 1 suppository (25 mg total) rectally every 12 (twelve) hours. 12 suppository 0  . ipratropium (ATROVENT) 0.03 % nasal spray USE 2 SPRAYS IN EACH NOSTRIL TWICE DAILY 30 mL 2  . Lancets (ONETOUCH DELICA PLUS [id number]) MISC USE TO TEST UP TO 4 TIMES A DAY    . levocetirizine (XYZAL) 5 MG tablet TK 1 T PO QD IN THE EVE  6  . linaclotide (LINZESS) 72 MCG capsule Take 1 capsule (72 mcg total) by mouth daily before breakfast. 30 capsule 1  . metFORMIN (GLUCOPHAGE-XR) 500 MG 24 hr tablet TAKE  4 TABLETS BY MOUTH ONCE DAILY WITH FOOD 360 tablet 3  . montelukast (SINGULAIR) 10 MG tablet Take 10 mg by mouth at bedtime.     . Multiple Vitamins-Minerals (ZINC PO) Take 1 tablet by mouth daily.    Marland Kitchen olmesartan (BENICAR) 40 MG tablet Take 1 tablet (40 mg total) by mouth daily. 90 tablet 3  . omeprazole (PRILOSEC) 40 MG capsule TAKE 1 CAPSULE BY MOUTH EVERY DAY 30 capsule 3  . rosuvastatin (CRESTOR) 5 MG tablet Take 0.5 tablets (2.5 mg total) by mouth every other day. 45 tablet 1  . spironolactone (ALDACTONE) 25 MG tablet Take 1 tablet (25 mg total) by mouth 2 (two) times daily. 180  tablet 1  . triamcinolone (NASACORT) 55 MCG/ACT AERO nasal inhaler Place 2 sprays into the nose daily. 1 Inhaler 12  . vitamin B-12 (CYANOCOBALAMIN) 1000 MCG tablet Take 1,000 mcg by mouth daily.    . vitamin E 400 UNIT capsule Take 400 Units by mouth daily.    . Vitamin Mixture (VITAMIN E COMPLETE PO) Take by mouth.     No current facility-administered medications on file prior to visit.   Allergies  Allergen Reactions  . Ace Inhibitors Anaphylaxis  . Shellfish Allergy Anaphylaxis and Other (See Comments)    Only shrimp  . Januvia [Sitagliptin] Other (See Comments)    Recurrent abdominal pain with elevated pancreas enzymes  . Latex Itching  . Lipitor [Atorvastatin]     No results found for this or any previous visit (from the past 2160 hour(s)).  Objective: General: Patient is awake, alert, and oriented x 3 and in no acute distress.  Integument: Skin is warm, dry and supple bilateral. Nails are tender, long, thickened and dystrophic with subungual debris, consistent with onychomycosis, 1-5 bilateral. No signs of infection. No open lesions or preulcerative lesions present bilateral. Remaining integument unremarkable.  Vasculature:  Dorsalis Pedis pulse 2/4 bilateral. Posterior Tibial pulse faint 1/4 bilateral. Capillary fill time <3 sec 1-5 bilateral. + scant hair growth to the level of the digits. Temperature gradient within normal limits. + varicosities present bilateral. Minimal edema present bilateral.   Neurology: The patient has intact sensation measured with a 5.07/10g Semmes Weinstein Monofilament at all pedal sites bilateral.   Musculoskeletal: No symptomatic pedal deformities noted bilateral. + Pes planus. Muscular strength 5/5 in all lower extremity muscular groups bilateral without pain on range of motion. No tenderness with calf compression bilateral.  Assessment and Plan: Problem List Items Addressed This Visit    None    Visit Diagnoses    Pain due to  onychomycosis of nail    -  Primary   Type II diabetes mellitus with peripheral circulatory disorder ([facility])         -Examined patient. -Re-Discussed and educated patient on diabetic foot care -Mechnically debrided all nails 1-5 bilateral using sterile nail nipper and filed with dremel without incident  -Patient to return in 45 to 12 weeks for at risk foot care -Patient advised to call the office if any problems or questions arise in the meantime.  Landis Martins, DPM

## 2019-08-16 ENCOUNTER — Other Ambulatory Visit: Payer: Self-pay | Admitting: Obstetrics & Gynecology

## 2019-08-16 ENCOUNTER — Other Ambulatory Visit (HOSPITAL_COMMUNITY): Payer: Self-pay | Admitting: Obstetrics & Gynecology

## 2019-08-16 DIAGNOSIS — R1030 Lower abdominal pain, unspecified: Secondary | ICD-10-CM

## 2019-08-23 ENCOUNTER — Ambulatory Visit (HOSPITAL_COMMUNITY): Admission: RE | Admit: 2019-08-23 | Payer: 59 | Source: Ambulatory Visit

## 2019-08-23 ENCOUNTER — Encounter (HOSPITAL_COMMUNITY): Payer: Self-pay

## 2019-09-06 ENCOUNTER — Ambulatory Visit (HOSPITAL_COMMUNITY): Payer: 59

## 2019-09-13 ENCOUNTER — Ambulatory Visit (HOSPITAL_COMMUNITY): Admission: RE | Admit: 2019-09-13 | Payer: 59 | Source: Ambulatory Visit

## 2019-09-27 ENCOUNTER — Ambulatory Visit (HOSPITAL_COMMUNITY): Payer: 59

## 2019-10-04 ENCOUNTER — Ambulatory Visit (HOSPITAL_COMMUNITY): Payer: 59

## 2019-10-19 ENCOUNTER — Ambulatory Visit: Payer: 59 | Admitting: Sports Medicine

## 2019-10-19 ENCOUNTER — Other Ambulatory Visit: Payer: Self-pay

## 2019-10-19 ENCOUNTER — Encounter: Payer: Self-pay | Admitting: Sports Medicine

## 2019-10-19 VITALS — Temp 97.3°F

## 2019-10-19 DIAGNOSIS — E1151 Type 2 diabetes mellitus with diabetic peripheral angiopathy without gangrene: Secondary | ICD-10-CM

## 2019-10-19 DIAGNOSIS — B351 Tinea unguium: Secondary | ICD-10-CM | POA: Diagnosis not present

## 2019-10-19 DIAGNOSIS — M79609 Pain in unspecified limb: Secondary | ICD-10-CM

## 2019-10-19 NOTE — Progress Notes (Signed)
Subjective: Maria Sharp is a 54 y.o. female patient with history of diabetes who returns to office today complaining of long,mildly painful nails  while ambulating in shoes; unable to trim. Patient states that her glucose has not been recorded, DIET CONTROLLED DIABETIC and not on meds, like before, Last a1c 5.9. No other issues noted.  Patient Active Problem List   Diagnosis Date Noted  . Exposure to COVID-19 virus 08/26/2018  . Migraines 09/02/2017  . DJD (degenerative joint disease) 09/02/2017  . Vertigo 12/05/2016  . Bilateral temporomandibular joint pain 12/05/2016  . Constipation   . Asthma, chronic 09/28/2014  . Gastroparesis   . Varicose veins 09/14/2013  . Neck pain on left side 06/14/2013  . Current non-adherence to medical treatment 06/14/2013  . Vitamin D deficiency 10/05/2012  . Leg cramps 10/05/2012  . Family history of diabetes mellitus 10/05/2012  . Type 2 diabetes mellitus, uncontrolled ([place]) 05/28/2012  . 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 ABDOMINAL PAIN, UNSPECIFIED SITE 05/18/2010  . CHEST PAIN, INTERMITTENT 05/12/2009  . Obesity 04/05/2009  . CARDIOMEGALY 04/05/2009  . PALPITATIONS 04/05/2009  . Type 2 diabetes mellitus with peripheral circulatory disorder ([place]) 03/15/2009  . BACK PAIN 03/09/2008  . Hyperlipidemia LDL goal <100 03/03/2008  . Essential hypertension 03/03/2008  . GERD 03/03/2008  . IRRITABLE BOWEL SYNDROME 03/03/2008  . RECTAL BLEEDING 03/03/2008   Current Outpatient Medications on File Prior to Visit  Medication Sig Dispense Refill  . albuterol (PROVENTIL HFA;VENTOLIN HFA) 108 (90 Base) MCG/ACT inhaler INHALE 2 PUFFS Q 4 TO 6 H PRN  0  . ARNUITY ELLIPTA 100 MCG/ACT AEPB INHALE BY MOUTH ONCE PER DAY  6  . azelastine (ASTELIN) 0.1 % nasal spray U 1 TO 2 SPRAYS IEN BID  6  . benzonatate (TESSALON) 100 MG capsule Take 1-2 capsules (100-200 mg total) by mouth 3 (three) times daily as needed for cough. 40 capsule 0  . BIOTIN PO Take 1  tablet by mouth daily.    . blood glucose meter kit and supplies KIT Dispense based on patient and insurance preference. Use up to four times daily as directed. (FOR ICD-9 250.00, 250.01). 1 each 0  . Calcium Carbonate-Vit D-Min (CALCIUM 1200 PO) Take by mouth.    . chlorthalidone (HYGROTON) 25 MG tablet Take 1 tablet (25 mg total) by mouth daily. 90 tablet 0  . Cholecalciferol (D3-1000 PO) Take 2,000 mg by mouth 2 (two) times a day.    Marland Kitchen EPINEPHrine (EPI-PEN) 0.3 mg/0.3 mL DEVI Inject 0.3 mg into the muscle once as needed. For severe allergic reaction    . ergocalciferol (VITAMIN D2) 1.25 MG (50000 UT) capsule Vitamin D2 1,250 mcg (50,000 unit) capsule    . Ferrous Sulfate (IRON PO) Take 1 tablet by mouth daily.    Marland Kitchen glucose blood (ONE TOUCH ULTRA TEST) test strip USE TO TEST 4 TIMES A DAY    . hydrocortisone (ANUSOL-HC) 25 MG suppository Place 1 suppository (25 mg total) rectally every 12 (twelve) hours. 12 suppository 0  . ipratropium (ATROVENT) 0.03 % nasal spray USE 2 SPRAYS IN EACH NOSTRIL TWICE DAILY 30 mL 2  . Lancets (ONETOUCH DELICA PLUS [id number]) MISC USE TO TEST UP TO 4 TIMES A DAY    . levocetirizine (XYZAL) 5 MG tablet TK 1 T PO QD IN THE EVE  6  . linaclotide (LINZESS) 72 MCG capsule Take 1 capsule (72 mcg total) by mouth daily before breakfast. 30 capsule 1  . metFORMIN (GLUCOPHAGE-XR) 500 MG 24 hr  tablet TAKE 4 TABLETS BY MOUTH ONCE DAILY WITH FOOD 360 tablet 3  . montelukast (SINGULAIR) 10 MG tablet Take 10 mg by mouth at bedtime.     . Multiple Vitamins-Minerals (ZINC PO) Take 1 tablet by mouth daily.    Marland Kitchen olmesartan (BENICAR) 40 MG tablet Take 1 tablet (40 mg total) by mouth daily. 90 tablet 3  . omeprazole (PRILOSEC) 40 MG capsule TAKE 1 CAPSULE BY MOUTH EVERY DAY 30 capsule 3  . rosuvastatin (CRESTOR) 5 MG tablet Take 0.5 tablets (2.5 mg total) by mouth every other day. 45 tablet 1  . spironolactone (ALDACTONE) 25 MG tablet Take 1 tablet (25 mg total) by mouth 2 (two) times  daily. 180 tablet 1  . triamcinolone (NASACORT) 55 MCG/ACT AERO nasal inhaler Place 2 sprays into the nose daily. 1 Inhaler 12  . vitamin B-12 (CYANOCOBALAMIN) 1000 MCG tablet Take 1,000 mcg by mouth daily.    . vitamin E 400 UNIT capsule Take 400 Units by mouth daily.    . Vitamin Mixture (VITAMIN E COMPLETE PO) Take by mouth.     No current facility-administered medications on file prior to visit.   Allergies  Allergen Reactions  . Ace Inhibitors Anaphylaxis  . Shellfish Allergy Anaphylaxis and Other (See Comments)    Only shrimp  . Januvia [Sitagliptin] Other (See Comments)    Recurrent abdominal pain with elevated pancreas enzymes  . Latex Itching  . Lipitor [Atorvastatin]     No results found for this or any previous visit (from the past 2160 hour(s)).  Objective: General: Patient is awake, alert, and oriented x 3 and in no acute distress.  Integument: Skin is warm, dry and supple bilateral. Nails are tender, long, thickened and dystrophic with subungual debris, consistent with onychomycosis, 1-5 bilateral. No signs of infection. No open lesions or preulcerative lesions present bilateral. Remaining integument unremarkable.  Vasculature:  Dorsalis Pedis pulse 2/4 bilateral. Posterior Tibial pulse faint 1/4 bilateral. Capillary fill time <3 sec 1-5 bilateral. + scant hair growth to the level of the digits. Temperature gradient within normal limits. + varicosities present bilateral. Minimal edema present bilateral ankles.   Neurology: The patient has intact sensation measured with a 5.07/10g Semmes Weinstein Monofilament at all pedal sites bilateral.   Musculoskeletal: No symptomatic pedal deformities noted bilateral. + Pes planus. Muscular strength 5/5 in all lower extremity muscular groups bilateral without pain on range of motion. No tenderness with calf compression bilateral.  Assessment and Plan: Problem List Items Addressed This Visit    None    Visit Diagnoses    Pain  due to onychomycosis of nail    -  Primary   Type II diabetes mellitus with peripheral circulatory disorder ([facility])         -Examined patient. -Re-Discussed and educated patient on diabetic foot care -Mechnically debrided all nails 1-5 bilateral using sterile nail nipper and filed with dremel without incident  -Advised patient may file nails in between visits to prevent them from growing out so long -Encouraged elevation to assist with edema at ankles -Patient to return in 10 weeks for at risk foot care -Patient advised to call the office if any problems or questions arise in the meantime.  Landis Martins, DPM

## 2019-10-26 ENCOUNTER — Ambulatory Visit (HOSPITAL_COMMUNITY): Payer: 59

## 2019-12-08 ENCOUNTER — Ambulatory Visit: Payer: 59 | Admitting: Gastroenterology

## 2019-12-17 ENCOUNTER — Other Ambulatory Visit: Payer: Self-pay | Admitting: Gastroenterology

## 2019-12-20 ENCOUNTER — Ambulatory Visit (HOSPITAL_COMMUNITY): Admission: RE | Admit: 2019-12-20 | Payer: 59 | Source: Ambulatory Visit

## 2019-12-20 ENCOUNTER — Encounter (HOSPITAL_COMMUNITY): Payer: Self-pay

## 2019-12-30 ENCOUNTER — Encounter: Payer: Self-pay | Admitting: Sports Medicine

## 2019-12-30 ENCOUNTER — Other Ambulatory Visit: Payer: Self-pay

## 2019-12-30 ENCOUNTER — Ambulatory Visit: Payer: 59 | Admitting: Sports Medicine

## 2019-12-30 DIAGNOSIS — E119 Type 2 diabetes mellitus without complications: Secondary | ICD-10-CM

## 2019-12-30 DIAGNOSIS — B351 Tinea unguium: Secondary | ICD-10-CM | POA: Diagnosis not present

## 2019-12-30 DIAGNOSIS — I739 Peripheral vascular disease, unspecified: Secondary | ICD-10-CM

## 2019-12-30 DIAGNOSIS — E1151 Type 2 diabetes mellitus with diabetic peripheral angiopathy without gangrene: Secondary | ICD-10-CM | POA: Diagnosis not present

## 2019-12-30 DIAGNOSIS — M79676 Pain in unspecified toe(s): Secondary | ICD-10-CM | POA: Diagnosis not present

## 2019-12-30 NOTE — Progress Notes (Signed)
Subjective: Maria Sharp is a 54 y.o. female patient with history of diabetes who returns to office today complaining of long,mildly painful nails  while ambulating in shoes; unable to trim. Patient states that her glucose has not been recorded, DIET CONTROLLED DIABETIC and not on meds, like before, Last a1c 5.9 like previous. No other issues noted.  Patient Active Problem List   Diagnosis Date Noted  . Exposure to COVID-19 virus 08/26/2018  . Migraines 09/02/2017  . DJD (degenerative joint disease) 09/02/2017  . Vertigo 12/05/2016  . Bilateral temporomandibular joint pain 12/05/2016  . Constipation   . Asthma, chronic 09/28/2014  . Gastroparesis   . Varicose veins 09/14/2013  . Neck pain on left side 06/14/2013  . Current non-adherence to medical treatment 06/14/2013  . Vitamin D deficiency 10/05/2012  . Leg cramps 10/05/2012  . Family history of diabetes mellitus 10/05/2012  . Type 2 diabetes mellitus, uncontrolled ([place]) 05/28/2012  . 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 ABDOMINAL PAIN, UNSPECIFIED SITE 05/18/2010  . CHEST PAIN, INTERMITTENT 05/12/2009  . Obesity 04/05/2009  . CARDIOMEGALY 04/05/2009  . PALPITATIONS 04/05/2009  . Type 2 diabetes mellitus with peripheral circulatory disorder ([place]) 03/15/2009  . BACK PAIN 03/09/2008  . Hyperlipidemia LDL goal <100 03/03/2008  . Essential hypertension 03/03/2008  . GERD 03/03/2008  . IRRITABLE BOWEL SYNDROME 03/03/2008  . RECTAL BLEEDING 03/03/2008   Current Outpatient Medications on File Prior to Visit  Medication Sig Dispense Refill  . albuterol (PROVENTIL HFA;VENTOLIN HFA) 108 (90 Base) MCG/ACT inhaler INHALE 2 PUFFS Q 4 TO 6 H PRN  0  . ARNUITY ELLIPTA 100 MCG/ACT AEPB INHALE BY MOUTH ONCE PER DAY  6  . azelastine (ASTELIN) 0.1 % nasal spray U 1 TO 2 SPRAYS IEN BID  6  . benzonatate (TESSALON) 100 MG capsule Take 1-2 capsules (100-200 mg total) by mouth 3 (three) times daily as needed for cough. 40 capsule 0  . BIOTIN  PO Take 1 tablet by mouth daily.    . blood glucose meter kit and supplies KIT Dispense based on patient and insurance preference. Use up to four times daily as directed. (FOR ICD-9 250.00, 250.01). 1 each 0  . Calcium Carbonate-Vit D-Min (CALCIUM 1200 PO) Take by mouth.    . chlorthalidone (HYGROTON) 25 MG tablet Take 1 tablet (25 mg total) by mouth daily. 90 tablet 0  . Cholecalciferol (D3-1000 PO) Take 2,000 mg by mouth 2 (two) times a day.    Marland Kitchen EPINEPHrine (EPI-PEN) 0.3 mg/0.3 mL DEVI Inject 0.3 mg into the muscle once as needed. For severe allergic reaction    . ergocalciferol (VITAMIN D2) 1.25 MG (50000 UT) capsule Vitamin D2 1,250 mcg (50,000 unit) capsule    . Ferrous Sulfate (IRON PO) Take 1 tablet by mouth daily.    Marland Kitchen glucose blood (ONE TOUCH ULTRA TEST) test strip USE TO TEST 4 TIMES A DAY    . hydrocortisone (ANUSOL-HC) 25 MG suppository Place 1 suppository (25 mg total) rectally every 12 (twelve) hours. 12 suppository 0  . ipratropium (ATROVENT) 0.03 % nasal spray USE 2 SPRAYS IN EACH NOSTRIL TWICE DAILY 30 mL 2  . Lancets (ONETOUCH DELICA PLUS [id number]) MISC USE TO TEST UP TO 4 TIMES A DAY    . levocetirizine (XYZAL) 5 MG tablet TK 1 T PO QD IN THE EVE  6  . linaclotide (LINZESS) 72 MCG capsule Take 1 capsule (72 mcg total) by mouth daily before breakfast. 30 capsule 1  . metFORMIN (GLUCOPHAGE-XR) 500 MG  24 hr tablet TAKE 4 TABLETS BY MOUTH ONCE DAILY WITH FOOD 360 tablet 3  . metFORMIN (GLUCOPHAGE-XR) 750 MG 24 hr tablet Take 1,500 mg by mouth daily.    . metroNIDAZOLE (METROGEL) 0.75 % vaginal gel metronidazole 0.75 % vaginal gel  INSERT 1 APPLICATORFUL VAGINALLY EVERY DAY FOR 5 DAYS    . montelukast (SINGULAIR) 10 MG tablet Take 10 mg by mouth at bedtime.     . Multiple Vitamins-Minerals (ZINC PO) Take 1 tablet by mouth daily.    Marland Kitchen olmesartan (BENICAR) 40 MG tablet Take 1 tablet (40 mg total) by mouth daily. 90 tablet 3  . omeprazole (PRILOSEC) 40 MG capsule TAKE 1 CAPSULE BY  MOUTH EVERY DAY 30 capsule 3  . rosuvastatin (CRESTOR) 5 MG tablet Take 0.5 tablets (2.5 mg total) by mouth every other day. 45 tablet 1  . spironolactone (ALDACTONE) 25 MG tablet Take 1 tablet (25 mg total) by mouth 2 (two) times daily. 180 tablet 1  . triamcinolone (NASACORT) 55 MCG/ACT AERO nasal inhaler Place 2 sprays into the nose daily. 1 Inhaler 12  . triamcinolone cream (KENALOG) 0.1 % triamcinolone acetonide 0.1 % topical cream  APPLY THIN LAYER TOPICALLY TO THE AFFECTED AREA TWICE DAILY    . vitamin B-12 (CYANOCOBALAMIN) 1000 MCG tablet Take 1,000 mcg by mouth daily.    . vitamin E 400 UNIT capsule Take 400 Units by mouth daily.    . Vitamin Mixture (VITAMIN E COMPLETE PO) Take by mouth.     No current facility-administered medications on file prior to visit.   Allergies  Allergen Reactions  . Ace Inhibitors Anaphylaxis  . Shellfish Allergy Anaphylaxis and Other (See Comments)    Only shrimp  . Januvia [Sitagliptin] Other (See Comments)    Recurrent abdominal pain with elevated pancreas enzymes  . Latex Itching  . Lipitor [Atorvastatin]     No results found for this or any previous visit (from the past 2160 hour(s)).  Objective: General: Patient is awake, alert, and oriented x 3 and in no acute distress.  Integument: Skin is warm, dry and supple bilateral. Nails are tender, long, thickened and dystrophic with subungual debris, consistent with onychomycosis, 1-5 bilateral. No signs of infection. No open lesions or preulcerative lesions present bilateral. Remaining integument unremarkable.  Vasculature:  Dorsalis Pedis pulse 2/4 bilateral. Posterior Tibial pulse faint 1/4 bilateral. Capillary fill time <3 sec 1-5 bilateral. + scant hair growth to the level of the digits. Temperature gradient within normal limits. + varicosities present bilateral. Minimal edema present bilateral ankles.   Neurology: The patient has intact sensation measured with a 5.07/10g Semmes Weinstein  Monofilament at all pedal sites bilateral.   Musculoskeletal: No symptomatic pedal deformities noted bilateral. + Pes planus. Muscular strength 5/5 in all lower extremity muscular groups bilateral without pain on range of motion. No tenderness with calf compression bilateral.  Assessment and Plan: Problem List Items Addressed This Visit    None    Visit Diagnoses    Pain due to onychomycosis of nail    -  Primary   Type II diabetes mellitus with peripheral circulatory disorder (HCC)       Relevant Medications   metFORMIN (GLUCOPHAGE-XR) 750 MG 24 hr tablet   Diabetes mellitus without complication (HCC)       Relevant Medications   metFORMIN (GLUCOPHAGE-XR) 750 MG 24 hr tablet   PVD (peripheral vascular disease) ([place])         -Examined patient. -Re-Discussed and educated patient on diabetic  foot care -Mechnically debrided all nails 1-5 bilateral using sterile nail nipper and filed with dremel without incident  -Advised patient may file nails in between visits to prevent them from growing out so long -Encouraged elevation to assist with edema at ankles like before -Patient to return in 10 weeks for at risk foot care -Patient advised to call the office if any problems or questions arise in the meantime.  Landis Martins, DPM

## 2020-02-03 ENCOUNTER — Encounter: Payer: Self-pay | Admitting: Gastroenterology

## 2020-02-03 ENCOUNTER — Ambulatory Visit (INDEPENDENT_AMBULATORY_CARE_PROVIDER_SITE_OTHER): Payer: 59 | Admitting: Gastroenterology

## 2020-02-03 VITALS — BP 122/84 | HR 78 | Ht 70.0 in | Wt 241.4 lb

## 2020-02-03 DIAGNOSIS — R140 Abdominal distension (gaseous): Secondary | ICD-10-CM

## 2020-02-03 DIAGNOSIS — K582 Mixed irritable bowel syndrome: Secondary | ICD-10-CM

## 2020-02-03 NOTE — Patient Instructions (Addendum)
Continue Linzess 72 mcg daily  Take Benefiber 1 teaspoon three times a day with meals  Follow up in 6 months  We have given you a IBS food diary symptom sheet to fill out and keep up with your foods and symptoms after meals, bring with you at next appointment      Lactose-Free Diet, Adult If you have lactose intolerance, you are not able to digest lactose. Lactose is a natural sugar found mainly in dairy milk and dairy products. You may need to avoid all foods and beverages that contain lactose. A lactose-free diet can help you do this. Which foods have lactose? Lactose is found in dairy milk and dairy products, such as:  Yogurt.  Cheese.  Butter.  Margarine.  Sour cream.  Cream.  Whipped toppings and nondairy creamers.  Ice cream and other dairy-based desserts. Lactose is also found in foods or products made with dairy milk or milk ingredients. To find out whether a food contains dairy milk or a milk ingredient, look at the ingredients list. Avoid foods with the statement "May contain milk" and foods that contain:  Milk powder.  Whey.  Curd.  Caseinate.  Lactose.  Lactalbumin.  Lactoglobulin. What are alternatives to dairy milk and foods made with milk products?  Lactose-free milk.  Soy milk with added calcium and vitamin D.  Almond milk, coconut milk, rice milk, or other nondairy milk alternatives with added calcium and vitamin D. Note that these are low in protein.  Soy products, such as soy yogurt, soy cheese, soy ice cream, and soy-based sour cream.  Other nut milk products, such as almond yogurt, almond cheese, cashew yogurt, cashew cheese, cashew ice cream, coconut yogurt, and coconut ice cream. What are tips for following this plan?  Do not consume foods, beverages, vitamins, minerals, or medicines containing lactose. Read ingredient lists carefully.  Look for the words "lactose-free" on labels.  Use lactase enzyme drops or tablets as directed by  your health care provider.  Use lactose-free milk or a milk alternative, such as soy milk or almond milk, for drinking and cooking.  Make sure you get enough calcium and vitamin D in your diet. A lactose-free eating plan can be lacking in these important nutrients.  Take calcium and vitamin D supplements as directed by your health care provider. Talk to your health care provider about supplements if you are not able to get enough calcium and vitamin D from food. What foods can I eat?  Fruits All fresh, canned, frozen, or dried fruits that are not processed with lactose. Vegetables All fresh, frozen, and canned vegetables without cheese, cream, or butter sauces. Grains Any that are not made with dairy milk or dairy products. Meats and other proteins Any meat, fish, poultry, and other protein sources that are not made with dairy milk or dairy products. Soy cheese and yogurt. Fats and oils Any that are not made with dairy milk or dairy products. Beverages Lactose-free milk. Soy, rice, or almond milk with added calcium and vitamin D. Fruit and vegetable juices. Sweets and desserts Any that are not made with dairy milk or dairy products. Seasonings and condiments Any that are not made with dairy milk or dairy products. Calcium Calcium is found in many foods that contain lactose and is important for bone health. The amount of calcium you need depends on your age:  Adults younger than 50 years: 1,000 mg of calcium a day.  Adults older than 50 years: 1,200 mg of calcium a  day. If you are not getting enough calcium, you may get it from other sources, including:  Orange juice with calcium added. There are 300-350 mg of calcium in 1 cup of orange juice.  Calcium-fortified soy milk. There are 300-400 mg of calcium in 1 cup of calcium-fortified soy milk.  Calcium-fortified rice or almond milk. There are 300 mg of calcium in 1 cup of calcium-fortified rice or almond milk.  Calcium-fortified  breakfast cereals. There are 100-1,000 mg of calcium in calcium-fortified breakfast cereals.  Spinach, cooked. There are 145 mg of calcium in  cup of cooked spinach.  Edamame, cooked. There are 130 mg of calcium in  cup of cooked edamame.  Collard greens, cooked. There are 125 mg of calcium in  cup of cooked collard greens.  Kale, frozen or cooked. There are 90 mg of calcium in  cup of cooked or frozen kale.  Almonds. There are 95 mg of calcium in  cup of almonds.  Broccoli, cooked. There are 60 mg of calcium in 1 cup of cooked broccoli. The items listed above may not be a complete list of recommended foods and beverages. Contact a dietitian for more options. What foods are not recommended? Fruits None, unless they are made with dairy milk or dairy products. Vegetables None, unless they are made with dairy milk or dairy products. Grains Any grains that are made with dairy milk or dairy products. Meats and other proteins None, unless they are made with dairy milk or dairy products. Dairy All dairy products, including milk, goat's milk, buttermilk, kefir, acidophilus milk, flavored milk, evaporated milk, condensed milk, dulce de [place], eggnog, yogurt, cheese, and cheese spreads. Fats and oils Any that are made with milk or milk products. Margarines and salad dressings that contain milk or cheese. Cream. Half and half. Cream cheese. Sour cream. Chip dips made with sour cream or yogurt. Beverages Hot chocolate. Cocoa with lactose. Instant iced teas. Powdered fruit drinks. Smoothies made with dairy milk or yogurt. Sweets and desserts Any that are made with milk or milk products. Seasonings and condiments Chewing gum that has lactose. Spice blends if they contain lactose. Artificial sweeteners that contain lactose. Nondairy creamers. The items listed above may not be a complete list of foods and beverages to avoid. Contact a dietitian for more information. Summary  If you are  lactose intolerant, it means that you have a hard time digesting lactose, a natural sugar found in milk and milk products.  Following a lactose-free diet can help you manage this condition.  Calcium is important for bone health and is found in many foods that contain lactose. Talk with your health care provider about other sources of calcium. This information is not intended to replace advice given to you by your health care provider. Make sure you discuss any questions you have with your health care provider. Document Revised: 05/06/2017 Document Reviewed: 05/06/2017 Elsevier Patient Education  2020 [employer].

## 2020-02-03 NOTE — Progress Notes (Signed)
Maria Sharp    [id number]    23-Jul-1965  Primary Care Physician:Shaw, Laurey Arrow, MD  Referring Physician: Shawnee Knapp, MD [place],  [place] [address]   Chief complaint: IBS  HPI:  54 yr very pleasant F follow-up visit for chronic irritable bowel syndrome with alternating constipation and diarrhea.  She has 2 days of constipation followed by 3 to 4days diarrhea in a week.  On the days that she has diarrhea she has 1-2 semiformed to liquid bowel movements.  Denies any bleeding or melena.  She has abdominal bloating and excessive gas.  She cut down on cheese but likes to drink milk. No dysphagia, vomiting, melena or blood per rectum.  She lost some weight last year but has gained it back since  She is taking Linzess on most days except when she has severe diarrhea.  Colonoscopy March 26, 2018 with removal of 2 small subcentimeter sessile tubular adenomatous polyps and internal hemorrhoids  Outpatient Encounter Medications as of 02/03/2020  Medication Sig  . albuterol (PROVENTIL HFA;VENTOLIN HFA) 108 (90 Base) MCG/ACT inhaler INHALE 2 PUFFS Q 4 TO 6 H PRN  . ARNUITY ELLIPTA 100 MCG/ACT AEPB INHALE BY MOUTH ONCE PER DAY  . azelastine (ASTELIN) 0.1 % nasal spray U 1 TO 2 SPRAYS IEN BID  . blood glucose meter kit and supplies KIT Dispense based on patient and insurance preference. Use up to four times daily as directed. (FOR ICD-9 250.00, 250.01).  . chlorthalidone (HYGROTON) 25 MG tablet Take 1 tablet (25 mg total) by mouth daily.  . Cholecalciferol (D3-1000 PO) Take 1,000 mg by mouth daily.   Marland Kitchen EPINEPHrine (EPI-PEN) 0.3 mg/0.3 mL DEVI Inject 0.3 mg into the muscle once as needed. For severe allergic reaction  . glucose blood (ONE TOUCH ULTRA TEST) test strip USE TO TEST 4 TIMES A DAY  . Lancets (ONETOUCH DELICA PLUS [id number]) MISC USE TO TEST UP TO 4 TIMES A DAY  . levocetirizine (XYZAL) 5 MG tablet TK 1 T PO QD IN THE EVE  . linaclotide (LINZESS) 72 MCG  capsule Take 1 capsule (72 mcg total) by mouth daily before breakfast.  . metFORMIN (GLUCOPHAGE-XR) 500 MG 24 hr tablet TAKE 4 TABLETS BY MOUTH ONCE DAILY WITH FOOD  . montelukast (SINGULAIR) 10 MG tablet Take 10 mg by mouth at bedtime.   . Multiple Vitamins-Minerals (ZINC PO) Take 1 tablet by mouth daily.  Marland Kitchen olmesartan (BENICAR) 40 MG tablet Take 1 tablet (40 mg total) by mouth daily.  Marland Kitchen omeprazole (PRILOSEC) 40 MG capsule TAKE 1 CAPSULE BY MOUTH EVERY DAY  . rosuvastatin (CRESTOR) 5 MG tablet Take 0.5 tablets (2.5 mg total) by mouth every other day. (Patient taking differently: Take 5 mg by mouth every other day. )  . spironolactone (ALDACTONE) 25 MG tablet Take 1 tablet (25 mg total) by mouth 2 (two) times daily. (Patient taking differently: Take 25 mg by mouth daily. )  . vitamin B-12 (CYANOCOBALAMIN) 1000 MCG tablet Take 1,000 mcg by mouth daily.  . vitamin E 400 UNIT capsule Take 400 Units by mouth daily.  . [DISCONTINUED] benzonatate (TESSALON) 100 MG capsule Take 1-2 capsules (100-200 mg total) by mouth 3 (three) times daily as needed for cough.  . [DISCONTINUED] BIOTIN PO Take 1 tablet by mouth daily.  . [DISCONTINUED] Calcium Carbonate-Vit D-Min (CALCIUM 1200 PO) Take by mouth.  . [DISCONTINUED] ergocalciferol (VITAMIN D2) 1.25 MG (50000 UT) capsule Vitamin  D2 1,250 mcg (50,000 unit) capsule  . [DISCONTINUED] Ferrous Sulfate (IRON PO) Take 1 tablet by mouth daily.  . [DISCONTINUED] hydrocortisone (ANUSOL-HC) 25 MG suppository Place 1 suppository (25 mg total) rectally every 12 (twelve) hours.  . [DISCONTINUED] ipratropium (ATROVENT) 0.03 % nasal spray USE 2 SPRAYS IN EACH NOSTRIL TWICE DAILY  . [DISCONTINUED] metFORMIN (GLUCOPHAGE-XR) 750 MG 24 hr tablet Take 1,500 mg by mouth daily.  . [DISCONTINUED] metroNIDAZOLE (METROGEL) 0.75 % vaginal gel metronidazole 0.75 % vaginal gel  INSERT 1 APPLICATORFUL VAGINALLY EVERY DAY FOR 5 DAYS  . [DISCONTINUED] triamcinolone (NASACORT) 55 MCG/ACT  AERO nasal inhaler Place 2 sprays into the nose daily.  . [DISCONTINUED] triamcinolone cream (KENALOG) 0.1 % triamcinolone acetonide 0.1 % topical cream  APPLY THIN LAYER TOPICALLY TO THE AFFECTED AREA TWICE DAILY  . [DISCONTINUED] Vitamin Mixture (VITAMIN E COMPLETE PO) Take by mouth.   No facility-administered encounter medications on file as of 02/03/2020.    Allergies as of 02/03/2020 - Review Complete 02/03/2020  Allergen Reaction Noted  . Ace inhibitors Anaphylaxis 03/03/2008  . Shellfish allergy Anaphylaxis and Other (See Comments) 05/25/2011  . Januvia [sitagliptin] Other (See Comments) 10/05/2012  . Latex Itching   . Lipitor [atorvastatin]  02/10/2013    Past Medical History:  Diagnosis Date  . Allergy   . Asthma   . Diabetes mellitus   . Fatty liver   . Gastritis   . Gastroparesis    study 11 14  nl abd [place]  . GERD (gastroesophageal reflux disease)   . High cholesterol   . History of varicella   . Hypertension   . IBS (irritable bowel syndrome)     Past Surgical History:  Procedure Laterality Date  . ANAL RECTAL MANOMETRY N/A 04/03/2015   Procedure: ANO RECTAL MANOMETRY;  Surgeon: Mauri Pole, MD;  Location: [facility];  Service: Endoscopy;  Laterality: N/A;  . CHOLECYSTECTOMY    . WISDOM TOOTH EXTRACTION      Family History  Problem Relation Age of Onset  . Prostate cancer Father   . Diabetes Father   . Hypertension Father   . Diabetes Mother   . Heart disease Mother   . Arthritis Mother   . Hyperlipidemia Mother   . Hypertension Mother   . Parkinson's disease Mother   . Thyroid disease Mother   . Diabetes Paternal Grandmother   . Arthritis Paternal Grandmother   . Hyperlipidemia Paternal Grandmother   . Heart disease Paternal Grandmother   . Dementia Paternal Grandmother   . Hypertension Paternal Grandmother   . Hyperlipidemia Maternal Grandmother   . Hypertension Maternal Grandmother   . Hyperlipidemia Maternal Grandfather   .  Hypertension Maternal Grandfather   . Hyperlipidemia Paternal Grandfather   . Hypertension Paternal Grandfather   . Diabetes Paternal Grandfather   . Cholelithiasis Paternal Grandfather   . Colon cancer Neg Hx   . Esophageal cancer Neg Hx   . Stomach cancer Neg Hx   . Rectal cancer Neg Hx     Social History   Socioeconomic History  . Marital status: Significant Other    Spouse name: Not on file  . Number of children: 0  . Years of education: Not on file  . Highest education level: Not on file  Occupational History  . Occupation: Post office  Tobacco Use  . Smoking status: Never Smoker  . Smokeless tobacco: Never Used  Vaping Use  . Vaping Use: Never used  Substance and Sexual Activity  . Alcohol use:  No  . Drug use: No  . Sexual activity: Not on file  Other Topics Concern  . Not on file  Social History Narrative   Usually # of hours of sleep per night: 6   3 of people living at your residence? 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   Irregular sleep 7:30 to 11 or 12 and then 4 to 6:30 before she goes to work   Has attended college.   Originally from [place]   Abnormal pap remote last 2012    Social Determinants of Health   Financial Resource Strain:   . Difficulty of Paying Living Expenses: Not on file  Food Insecurity:   . Worried About Charity fundraiser in the Last Year: Not on file  . Ran Out of Food in the Last Year: Not on file  Transportation Needs:   . Lack of Transportation (Medical): Not on file  . Lack of Transportation (Non-Medical): Not on file  Physical Activity:   . Days of Exercise per Week: Not on file  . Minutes of Exercise per Session: Not on file  Stress:   . Feeling of Stress : Not on file  Social Connections:   . Frequency of Communication with Friends and Family: Not on file  . Frequency of Social Gatherings with  Friends and Family: Not on file  . Attends Religious Services: Not on file  . Active Member of Clubs or Organizations: Not on file  . Attends Archivist Meetings: Not on file  . Marital Status: Not on file  Intimate Partner Violence:   . Fear of Current or Ex-Partner: Not on file  . Emotionally Abused: Not on file  . Physically Abused: Not on file  . Sexually Abused: Not on file      Review of systems: All other review of systems negative except as mentioned in the HPI.   Physical Exam: Vitals:   02/03/20 0831  BP: 122/84  Pulse: 78  SpO2: 98%   Body mass index is 34.64 kg/m. Gen:      No acute distress HEENT:  sclera anicteric Abd:      soft, non-tender; no palpable masses, mild distension and tympanic anteriorly Ext:    No edema Neuro: alert and oriented x 3 Psych: normal mood and affect  Data Reviewed:  Reviewed labs, radiology imaging, old records and pertinent past GI work up   Assessment and Plan/Recommendations:  53 year old very pleasant female with chronic irritable bowel syndrome with alternating constipation and diarrhea Continue Linzess 72 mcg daily Start Benefiber 1 tablespoon 2-3 times daily with meals  Advised patient to maintain food symptom diary to track her symptoms and identify possible triggers  Trial of lactose-free diet for 1 to 2 weeks  Continue dicyclomine 20 mg every 6 hours as needed  GERD: Continue Protonix and antireflux measures  Return in 6 months or sooner if needed  This visit required 40 minutes of patient care (this includes precharting, chart review, review of results, face-to-face time used for counseling as well as treatment plan and follow-up. The patient was provided an opportunity to ask questions and all were answered. The patient agreed with the plan and demonstrated an understanding of the instructions.  Damaris Hippo , MD    CC: Shawnee Knapp, MD

## 2020-03-08 ENCOUNTER — Ambulatory Visit: Payer: 59 | Admitting: Orthopedic Surgery

## 2020-03-09 ENCOUNTER — Encounter: Payer: Self-pay | Admitting: Sports Medicine

## 2020-03-09 ENCOUNTER — Other Ambulatory Visit: Payer: Self-pay

## 2020-03-09 ENCOUNTER — Ambulatory Visit: Payer: 59 | Admitting: Sports Medicine

## 2020-03-09 DIAGNOSIS — M79609 Pain in unspecified limb: Secondary | ICD-10-CM | POA: Diagnosis not present

## 2020-03-09 DIAGNOSIS — E1151 Type 2 diabetes mellitus with diabetic peripheral angiopathy without gangrene: Secondary | ICD-10-CM

## 2020-03-09 DIAGNOSIS — B351 Tinea unguium: Secondary | ICD-10-CM | POA: Diagnosis not present

## 2020-03-09 DIAGNOSIS — I739 Peripheral vascular disease, unspecified: Secondary | ICD-10-CM

## 2020-03-09 NOTE — Progress Notes (Signed)
Subjective: PRINCESSA LESMEISTER is a 54 y.o. female patient with history of diabetes who returns to office today complaining of long,mildly painful nails  while ambulating in shoes; unable to trim. Patient states that her glucose has not been recorded, DIET CONTROLLED DIABETIC and not on meds, like before, Last a1c 5.9 like previous does not routinely check FBS at home. No other issues noted.  Patient Active Problem List   Diagnosis Date Noted  . Exposure to COVID-19 virus 08/26/2018  . Migraines 09/02/2017  . DJD (degenerative joint disease) 09/02/2017  . Vertigo 12/05/2016  . Bilateral temporomandibular joint pain 12/05/2016  . Constipation   . Asthma, chronic 09/28/2014  . Gastroparesis   . Varicose veins 09/14/2013  . Neck pain on left side 06/14/2013  . Current non-adherence to medical treatment 06/14/2013  . Vitamin D deficiency 10/05/2012  . Leg cramps 10/05/2012  . Family history of diabetes mellitus 10/05/2012  . Type 2 diabetes mellitus, uncontrolled ([place]) 05/28/2012  . 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 ABDOMINAL PAIN, UNSPECIFIED SITE 05/18/2010  . CHEST PAIN, INTERMITTENT 05/12/2009  . Obesity 04/05/2009  . CARDIOMEGALY 04/05/2009  . PALPITATIONS 04/05/2009  . Type 2 diabetes mellitus with peripheral circulatory disorder ([place]) 03/15/2009  . BACK PAIN 03/09/2008  . Hyperlipidemia LDL goal <100 03/03/2008  . Essential hypertension 03/03/2008  . GERD 03/03/2008  . IRRITABLE BOWEL SYNDROME 03/03/2008  . RECTAL BLEEDING 03/03/2008   Current Outpatient Medications on File Prior to Visit  Medication Sig Dispense Refill  . albuterol (PROVENTIL HFA;VENTOLIN HFA) 108 (90 Base) MCG/ACT inhaler INHALE 2 PUFFS Q 4 TO 6 H PRN  0  . ARNUITY ELLIPTA 100 MCG/ACT AEPB INHALE BY MOUTH ONCE PER DAY  6  . azelastine (ASTELIN) 0.1 % nasal spray U 1 TO 2 SPRAYS IEN BID  6  . blood glucose meter kit and supplies KIT Dispense based on patient and insurance preference. Use up to four times  daily as directed. (FOR ICD-9 250.00, 250.01). 1 each 0  . celecoxib (CELEBREX) 100 MG capsule Take 100 mg by mouth daily.    . chlorthalidone (HYGROTON) 25 MG tablet Take 1 tablet (25 mg total) by mouth daily. 90 tablet 0  . Cholecalciferol (D3-1000 PO) Take 1,000 mg by mouth daily.     Marland Kitchen EPINEPHrine (EPI-PEN) 0.3 mg/0.3 mL DEVI Inject 0.3 mg into the muscle once as needed. For severe allergic reaction    . glucose blood (ONE TOUCH ULTRA TEST) test strip USE TO TEST 4 TIMES A DAY    . Lancets (ONETOUCH DELICA PLUS [id number]) MISC USE TO TEST UP TO 4 TIMES A DAY    . levocetirizine (XYZAL) 5 MG tablet TK 1 T PO QD IN THE EVE  6  . linaclotide (LINZESS) 72 MCG capsule Take 1 capsule (72 mcg total) by mouth daily before breakfast. 30 capsule 1  . metFORMIN (GLUCOPHAGE-XR) 500 MG 24 hr tablet TAKE 4 TABLETS BY MOUTH ONCE DAILY WITH FOOD 360 tablet 3  . montelukast (SINGULAIR) 10 MG tablet Take 10 mg by mouth at bedtime.     . Multiple Vitamins-Minerals (ZINC PO) Take 1 tablet by mouth daily.    Marland Kitchen olmesartan (BENICAR) 40 MG tablet Take 1 tablet (40 mg total) by mouth daily. 90 tablet 3  . omeprazole (PRILOSEC) 40 MG capsule TAKE 1 CAPSULE BY MOUTH EVERY DAY 30 capsule 3  . rosuvastatin (CRESTOR) 5 MG tablet Take 0.5 tablets (2.5 mg total) by mouth every other day. (Patient taking differently: Take 5  mg by mouth every other day. ) 45 tablet 1  . spironolactone (ALDACTONE) 25 MG tablet Take 1 tablet (25 mg total) by mouth 2 (two) times daily. (Patient taking differently: Take 25 mg by mouth daily. ) 180 tablet 1  . tiZANidine (ZANAFLEX) 4 MG capsule Take 4 mg by mouth 3 (three) times daily.    . vitamin B-12 (CYANOCOBALAMIN) 1000 MCG tablet Take 1,000 mcg by mouth daily.    . vitamin E 400 UNIT capsule Take 400 Units by mouth daily.     No current facility-administered medications on file prior to visit.   Allergies  Allergen Reactions  . Ace Inhibitors Anaphylaxis  . Shellfish Allergy Anaphylaxis  and Other (See Comments)    Only shrimp  . Januvia [Sitagliptin] Other (See Comments)    Recurrent abdominal pain with elevated pancreas enzymes  . Latex Itching  . Lipitor [Atorvastatin]     No results found for this or any previous visit (from the past 2160 hour(s)).  Objective: General: Patient is awake, alert, and oriented x 3 and in no acute distress.  Integument: Skin is warm, dry and supple bilateral. Nails are tender, long, thickened and dystrophic with subungual debris, consistent with onychomycosis, 1-5 bilateral. No signs of infection. No open lesions or preulcerative lesions present bilateral. Callus sub met 1 on left. Remaining integument unremarkable.  Vasculature:  Dorsalis Pedis pulse 2/4 bilateral. Posterior Tibial pulse faint 1/4 bilateral. Capillary fill time <3 sec 1-5 bilateral. + scant hair growth to the level of the digits. Temperature gradient within normal limits. + varicosities present bilateral. Minimal edema present bilateral ankles.   Neurology: The patient has intact sensation measured with a 5.07/10g Semmes Weinstein Monofilament at all pedal sites bilateral.   Musculoskeletal: No symptomatic pedal deformities noted bilateral. + Pes planus. Muscular strength 5/5 in all lower extremity muscular groups bilateral without pain on range of motion. No tenderness with calf compression bilateral.  Assessment and Plan: Problem List Items Addressed This Visit    None    Visit Diagnoses    Pain due to onychomycosis of nail    -  Primary   Type II diabetes mellitus with peripheral circulatory disorder (HCC)       PVD (peripheral vascular disease) ([place])         -Examined patient. -Re-Discussed and educated patient on diabetic foot care -Mechnically debrided all nails 1-5 bilateral using sterile nail nipper and filed with dremel without incident  -At no additional charge debrided callus sub met 1 using 15 blade on left -Encouraged elevation to assist with edema at  ankles like before -Patient to return in 10 weeks for at risk foot care -Patient advised to call the office if any problems or questions arise in the meantime.  Landis Martins, DPM

## 2020-03-27 ENCOUNTER — Ambulatory Visit: Payer: 59 | Admitting: Orthopedic Surgery

## 2020-04-10 ENCOUNTER — Ambulatory Visit: Payer: 59 | Admitting: Orthopedic Surgery

## 2020-05-10 ENCOUNTER — Telehealth: Payer: Self-pay | Admitting: Gastroenterology

## 2020-05-10 NOTE — Telephone Encounter (Signed)
Pt is requesting a call back from a nurse to see how she can get her FMLA paperwork completed.

## 2020-05-11 NOTE — Telephone Encounter (Signed)
Spoke with the patient. She is wanting to renew her FMLA. She will come to the office to fill out the FMLA patient intake form and pay her fee.

## 2020-05-16 NOTE — Telephone Encounter (Signed)
Patient calling to follow up on FMLA paperwork. Is asking if they have been filled and faxed

## 2020-05-16 NOTE — Telephone Encounter (Signed)
Can you help with this?  FMLA papers never have come to me.

## 2020-05-16 NOTE — Telephone Encounter (Signed)
Maria Sharp I do not have this patients FMLA papers..... I have two others but  Not hers

## 2020-05-16 NOTE — Telephone Encounter (Signed)
Called patient and informed her that she has not been seen in our office but one time in 2021, that if Dr Silverio Decamp fills them out she probably would be denied. Per Dr Silverio Decamp pt would be better off carrying the forms to her PCP. Patient understands and will pick up her forms in the morning

## 2020-05-18 ENCOUNTER — Ambulatory Visit: Payer: 59 | Admitting: Sports Medicine

## 2020-05-18 ENCOUNTER — Encounter: Payer: Self-pay | Admitting: Sports Medicine

## 2020-05-18 ENCOUNTER — Other Ambulatory Visit: Payer: Self-pay

## 2020-05-18 DIAGNOSIS — B351 Tinea unguium: Secondary | ICD-10-CM

## 2020-05-18 DIAGNOSIS — M79609 Pain in unspecified limb: Secondary | ICD-10-CM | POA: Diagnosis not present

## 2020-05-18 DIAGNOSIS — I739 Peripheral vascular disease, unspecified: Secondary | ICD-10-CM

## 2020-05-18 DIAGNOSIS — E119 Type 2 diabetes mellitus without complications: Secondary | ICD-10-CM | POA: Diagnosis not present

## 2020-05-18 DIAGNOSIS — E1151 Type 2 diabetes mellitus with diabetic peripheral angiopathy without gangrene: Secondary | ICD-10-CM

## 2020-05-18 NOTE — Progress Notes (Signed)
Subjective: Maria Sharp is a 55 y.o. female patient with history of diabetes who returns to office today complaining of long,mildly painful nails  while ambulating in shoes; unable to trim. Patient states that her glucose has not been recorded, DIET CONTROLLED DIABETIC and not on meds, like before, Last a1c 5.9 and does not routinely check FBS at home as previously noted. Last PCP visit was in September and will go again on next week for blood work. No other issues noted.  Patient Active Problem List   Diagnosis Date Noted  . Exposure to COVID-19 virus 08/26/2018  . Migraines 09/02/2017  . DJD (degenerative joint disease) 09/02/2017  . Vertigo 12/05/2016  . Bilateral temporomandibular joint pain 12/05/2016  . Constipation   . Asthma, chronic 09/28/2014  . Gastroparesis   . Varicose veins 09/14/2013  . Neck pain on left side 06/14/2013  . Current non-adherence to medical treatment 06/14/2013  . Vitamin D deficiency 10/05/2012  . Leg cramps 10/05/2012  . Family history of diabetes mellitus 10/05/2012  . Type 2 diabetes mellitus, uncontrolled ([place]) 05/28/2012  . 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 ABDOMINAL PAIN, UNSPECIFIED SITE 05/18/2010  . CHEST PAIN, INTERMITTENT 05/12/2009  . Obesity 04/05/2009  . CARDIOMEGALY 04/05/2009  . PALPITATIONS 04/05/2009  . Type 2 diabetes mellitus with peripheral circulatory disorder ([place]) 03/15/2009  . BACK PAIN 03/09/2008  . Hyperlipidemia LDL goal <100 03/03/2008  . Essential hypertension 03/03/2008  . GERD 03/03/2008  . IRRITABLE BOWEL SYNDROME 03/03/2008  . RECTAL BLEEDING 03/03/2008   Current Outpatient Medications on File Prior to Visit  Medication Sig Dispense Refill  . albuterol (PROVENTIL HFA;VENTOLIN HFA) 108 (90 Base) MCG/ACT inhaler INHALE 2 PUFFS Q 4 TO 6 H PRN  0  . ARNUITY ELLIPTA 100 MCG/ACT AEPB INHALE BY MOUTH ONCE PER DAY  6  . azelastine (ASTELIN) 0.1 % nasal spray U 1 TO 2 SPRAYS IEN BID  6  . blood glucose meter kit  and supplies KIT Dispense based on patient and insurance preference. Use up to four times daily as directed. (FOR ICD-9 250.00, 250.01). 1 each 0  . celecoxib (CELEBREX) 100 MG capsule Take 100 mg by mouth daily.    . chlorthalidone (HYGROTON) 25 MG tablet Take 1 tablet (25 mg total) by mouth daily. 90 tablet 0  . Cholecalciferol (D3-1000 PO) Take 1,000 mg by mouth daily.     Marland Kitchen EPINEPHrine (EPI-PEN) 0.3 mg/0.3 mL DEVI Inject 0.3 mg into the muscle once as needed. For severe allergic reaction    . glucose blood (ONE TOUCH ULTRA TEST) test strip USE TO TEST 4 TIMES A DAY    . Lancets (ONETOUCH DELICA PLUS [id number]) MISC USE TO TEST UP TO 4 TIMES A DAY    . levocetirizine (XYZAL) 5 MG tablet TK 1 T PO QD IN THE EVE  6  . linaclotide (LINZESS) 72 MCG capsule Take 1 capsule (72 mcg total) by mouth daily before breakfast. 30 capsule 1  . metFORMIN (GLUCOPHAGE-XR) 500 MG 24 hr tablet TAKE 4 TABLETS BY MOUTH ONCE DAILY WITH FOOD 360 tablet 3  . montelukast (SINGULAIR) 10 MG tablet Take 10 mg by mouth at bedtime.     . Multiple Vitamins-Minerals (ZINC PO) Take 1 tablet by mouth daily.    Marland Kitchen olmesartan (BENICAR) 40 MG tablet Take 1 tablet (40 mg total) by mouth daily. 90 tablet 3  . omeprazole (PRILOSEC) 40 MG capsule TAKE 1 CAPSULE BY MOUTH EVERY DAY 30 capsule 3  . rosuvastatin (CRESTOR) 5  MG tablet Take 0.5 tablets (2.5 mg total) by mouth every other day. (Patient taking differently: Take 5 mg by mouth every other day. ) 45 tablet 1  . spironolactone (ALDACTONE) 25 MG tablet Take 1 tablet (25 mg total) by mouth 2 (two) times daily. (Patient taking differently: Take 25 mg by mouth daily. ) 180 tablet 1  . tiZANidine (ZANAFLEX) 4 MG capsule Take 4 mg by mouth 3 (three) times daily.    . vitamin B-12 (CYANOCOBALAMIN) 1000 MCG tablet Take 1,000 mcg by mouth daily.    . vitamin E 400 UNIT capsule Take 400 Units by mouth daily.     No current facility-administered medications on file prior to visit.    Allergies  Allergen Reactions  . Ace Inhibitors Anaphylaxis  . Shellfish Allergy Anaphylaxis and Other (See Comments)    Only shrimp  . Januvia [Sitagliptin] Other (See Comments)    Recurrent abdominal pain with elevated pancreas enzymes  . Latex Itching  . Lipitor [Atorvastatin]     No results found for this or any previous visit (from the past 2160 hour(s)).  Objective: General: Patient is awake, alert, and oriented x 3 and in no acute distress.  Integument: Skin is warm, dry and supple bilateral. Nails are tender, long, thickened and dystrophic with subungual debris, consistent with onychomycosis, 1-5 bilateral. No signs of infection. No open lesions or preulcerative lesions present bilateral. Callus sub met 1 on left. Remaining integument unremarkable.  Vasculature:  Dorsalis Pedis pulse 2/4 bilateral. Posterior Tibial pulse faint 1/4 bilateral. Capillary fill time <3 sec 1-5 bilateral. + scant hair growth to the level of the digits. Temperature gradient within normal limits. + varicosities present bilateral. Minimal edema present bilateral ankles.   Neurology: The patient has intact sensation measured with a 5.07/10g Semmes Weinstein Monofilament at all pedal sites bilateral.   Musculoskeletal: No symptomatic pedal deformities noted bilateral. + Pes planus. Muscular strength 5/5 in all lower extremity muscular groups bilateral without pain on range of motion. No tenderness with calf compression bilateral.  Assessment and Plan: Problem List Items Addressed This Visit   None   Visit Diagnoses    Pain due to onychomycosis of nail    -  Primary   Type II diabetes mellitus with peripheral circulatory disorder (HCC)       PVD (peripheral vascular disease) (HCC)       Diabetes mellitus without complication ([place])         -Examined patient. -Re-Discussed and educated patient on diabetic foot care and importance of daily inspection  -Mechnically debrided all nails 1-5 bilateral  using sterile nail nipper and filed with dremel without incident  -At no additional charge smoothed callus sub met 1 on left using rotary bur without incident  -Patient to return in 10 weeks for at risk foot care -Patient advised to call the office if any problems or questions arise in the meantime.  Landis Martins, DPM

## 2020-06-29 ENCOUNTER — Ambulatory Visit: Payer: 59 | Admitting: Sports Medicine

## 2020-07-19 ENCOUNTER — Other Ambulatory Visit: Payer: Self-pay

## 2020-07-19 ENCOUNTER — Telehealth: Payer: Self-pay | Admitting: *Deleted

## 2020-07-19 ENCOUNTER — Encounter: Payer: Self-pay | Admitting: Gastroenterology

## 2020-07-19 ENCOUNTER — Ambulatory Visit: Payer: 59 | Admitting: Gastroenterology

## 2020-07-19 VITALS — BP 134/84 | HR 85 | Ht 70.0 in | Wt 233.0 lb

## 2020-07-19 DIAGNOSIS — R140 Abdominal distension (gaseous): Secondary | ICD-10-CM | POA: Diagnosis not present

## 2020-07-19 DIAGNOSIS — K582 Mixed irritable bowel syndrome: Secondary | ICD-10-CM

## 2020-07-19 MED ORDER — LUBIPROSTONE 8 MCG PO CAPS: 8.0000 ug | ORAL_CAPSULE | Freq: Two times a day (BID) | ORAL | 3 refills | Status: DC

## 2020-07-19 MED ORDER — DICYCLOMINE HCL 20 MG PO TABS: 20.0000 mg | ORAL_TABLET | Freq: Two times a day (BID) | ORAL | 2 refills | Status: DC | PRN

## 2020-07-19 NOTE — Telephone Encounter (Signed)
Waiting on response from Cover My Meds on prior auth for Amitiza  Submitted today

## 2020-07-19 NOTE — Progress Notes (Signed)
Maria Sharp    [id number]    13-Dec-1965  Primary Care Physician:Shaw, Laurey Arrow, MD  Referring Physician: Shawnee Knapp, MD [place],   [id number]   Chief complaint:  IBS- diarrhea and constipation  HPI:  62 yr very pleasant F follow-up visit for chronic irritable bowel syndrome with alternating constipation and diarrhea.  She continues to have irregular bowel habits with alternating diarrhea and constipation, on average she has constipation once or twice a week and uses Linzess as needed. Most days she has 2-3 bowel movements varying from formed to semiformed stool.  She has episodes of diarrhea when she has not had a bowel movement for a day or 2.  She had an episode of bright red blood per rectum, noticed it on toilet paper when she wiped after a large bowel movement, she may have strained excessively and she is not sure if it was vaginal bleeding because she had her menstrual bleeding the next day. She has abdominal bloating and excessive gas.  She cut down on cheese and milk. No dysphagia, vomiting, weight loss or decreased appetite   Colonoscopy March 26, 2018 with removal of 2 small subcentimeter sessile tubular adenomatous polyps and internal hemorrhoids   Outpatient Encounter Medications as of 07/19/2020  Medication Sig  . albuterol (PROVENTIL HFA;VENTOLIN HFA) 108 (90 Base) MCG/ACT inhaler INHALE 2 PUFFS Q 4 TO 6 H PRN  . ARNUITY ELLIPTA 100 MCG/ACT AEPB INHALE BY MOUTH ONCE PER DAY  . azelastine (ASTELIN) 0.1 % nasal spray U 1 TO 2 SPRAYS IEN BID  . blood glucose meter kit and supplies KIT Dispense based on patient and insurance preference. Use up to four times daily as directed. (FOR ICD-9 250.00, 250.01).  . celecoxib (CELEBREX) 100 MG capsule Take 100 mg by mouth daily.  . chlorthalidone (HYGROTON) 25 MG tablet Take 1 tablet (25 mg total) by mouth daily.  . Cholecalciferol (D3-1000 PO) Take 1,000 mg by mouth daily.   Marland Kitchen EPINEPHrine  (EPI-PEN) 0.3 mg/0.3 mL DEVI Inject 0.3 mg into the muscle once as needed. For severe allergic reaction  . glucose blood test strip USE TO TEST 4 TIMES A DAY  . Lancets (ONETOUCH DELICA PLUS [id number]) MISC USE TO TEST UP TO 4 TIMES A DAY  . levocetirizine (XYZAL) 5 MG tablet TK 1 T PO QD IN THE EVE  . linaclotide (LINZESS) 72 MCG capsule Take 1 capsule (72 mcg total) by mouth daily before breakfast.  . metFORMIN (GLUCOPHAGE-XR) 500 MG 24 hr tablet TAKE 4 TABLETS BY MOUTH ONCE DAILY WITH FOOD  . montelukast (SINGULAIR) 10 MG tablet Take 10 mg by mouth at bedtime.   . Multiple Vitamins-Minerals (ALIVE WOMENS 50+) TABS See admin instructions.  . Multiple Vitamins-Minerals (ZINC PO) Take 1 tablet by mouth daily.  Marland Kitchen olmesartan (BENICAR) 40 MG tablet Take 1 tablet (40 mg total) by mouth daily.  Marland Kitchen omeprazole (PRILOSEC) 40 MG capsule TAKE 1 CAPSULE BY MOUTH EVERY DAY  . rosuvastatin (CRESTOR) 5 MG tablet Take 0.5 tablets (2.5 mg total) by mouth every other day. (Patient taking differently: Take 5 mg by mouth every other day.)  . spironolactone (ALDACTONE) 25 MG tablet Take 1 tablet (25 mg total) by mouth 2 (two) times daily. (Patient taking differently: Take 25 mg by mouth daily.)  . tiZANidine (ZANAFLEX) 4 MG capsule Take 4 mg by mouth 3 (three) times daily.  Marland Kitchen triamcinolone (NASACORT) 55 MCG/ACT AERO  nasal inhaler 1 spray in each nostril  . vitamin B-12 (CYANOCOBALAMIN) 1000 MCG tablet Take 1,000 mcg by mouth daily.  . vitamin E 400 UNIT capsule Take 400 Units by mouth daily.  . Zinc 100 MG TABS 1 tablet   No facility-administered encounter medications on file as of 07/19/2020.    Allergies as of 07/19/2020 - Review Complete 05/18/2020  Allergen Reaction Noted  . Ace inhibitors Anaphylaxis 03/03/2008  . Shellfish allergy Anaphylaxis and Other (See Comments) 05/25/2011  . Januvia [sitagliptin] Other (See Comments) 10/05/2012  . Latex Itching   . Lipitor [atorvastatin]  02/10/2013    Past  Medical History:  Diagnosis Date  . Allergy   . Asthma   . Diabetes mellitus   . Fatty liver   . Gastritis   . Gastroparesis    study 11 14  nl abd [place]  . GERD (gastroesophageal reflux disease)   . High cholesterol   . History of varicella   . Hypertension   . IBS (irritable bowel syndrome)     Past Surgical History:  Procedure Laterality Date  . ANAL RECTAL MANOMETRY N/A 04/03/2015   Procedure: ANO RECTAL MANOMETRY;  Surgeon: Mauri Pole, MD;  Location: [facility];  Service: Endoscopy;  Laterality: N/A;  . CHOLECYSTECTOMY    . WISDOM TOOTH EXTRACTION      Family History  Problem Relation Age of Onset  . Prostate cancer Father   . Diabetes Father   . Hypertension Father   . Diabetes Mother   . Heart disease Mother   . Arthritis Mother   . Hyperlipidemia Mother   . Hypertension Mother   . Parkinson's disease Mother   . Thyroid disease Mother   . Diabetes Paternal Grandmother   . Arthritis Paternal Grandmother   . Hyperlipidemia Paternal Grandmother   . Heart disease Paternal Grandmother   . Dementia Paternal Grandmother   . Hypertension Paternal Grandmother   . Hyperlipidemia Maternal Grandmother   . Hypertension Maternal Grandmother   . Hyperlipidemia Maternal Grandfather   . Hypertension Maternal Grandfather   . Hyperlipidemia Paternal Grandfather   . Hypertension Paternal Grandfather   . Diabetes Paternal Grandfather   . Cholelithiasis Paternal Grandfather   . Colon cancer Neg Hx   . Esophageal cancer Neg Hx   . Stomach cancer Neg Hx   . Rectal cancer Neg Hx     Social History   Socioeconomic History  . Marital status: Significant Other    Spouse name: Not on file  . Number of children: 0  . Years of education: Not on file  . Highest education level: Not on file  Occupational History  . Occupation: Post office  Tobacco Use  . Smoking status: Never Smoker  . Smokeless tobacco: Never Used  Vaping Use  . Vaping Use: Never used  Substance  and Sexual Activity  . Alcohol use: No  . Drug use: No  . Sexual activity: Not on file  Other Topics Concern  . Not on file  Social History Narrative   Usually # of hours of sleep per night: 6   3 of people living at your residence? 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   Irregular sleep 7:30 to 11 or 12 and then 4 to 6:30 before she goes to work   Has attended college.   Originally from [place].  G0P0   Abnormal pap remote last 2012    Social Determinants of Health   Financial Resource Strain: Not on file  Food Insecurity: Not on file  Transportation Needs: Not on file  Physical Activity: Not on file  Stress: Not on file  Social Connections: Not on file  Intimate Partner Violence: Not on file      Review of systems: All other review of systems negative except as mentioned in the HPI.   Physical Exam: Vitals:   07/19/20 0836  BP: 134/84  Pulse: 85   Body mass index is 33.43 kg/m. Gen:      No acute distress HEENT:  sclera anicteric Abd:      soft, non-tender; no palpable masses, + distension and increased stool burden Ext:    No edema Neuro: alert and oriented x 3 Psych: normal mood and affect  Data Reviewed:  Reviewed labs, radiology imaging, old records and pertinent past GI work up   Assessment and Plan/Recommendations:  55 year old very pleasant female with chronic irritable bowel syndrome with alternating constipation and diarrhea Benefiber 1 tablespoon 2-3 times daily with meals Will switch to Amitiza 8 mcg twice daily, start Linzess  Evidence of increased stool burden based on abdominal exam, advised patient to do MiraLAX bowel purge  Use dicyclomine 20 mg twice daily as needed for abdominal cramping and bloating Melatonin 5 mg daily (she works third shift and her sleep cycle is altered)  Refer to pelvic floor physical therapy for  dyssynergic defecation and fecal incontinence with urgency   GERD: Continue Protonix and antireflux measures  Return in 4 months or sooner if needed  This visit required >40 minutes of patient care (this includes precharting, chart review, review of results, face-to-face time used for counseling as well as treatment plan and follow-up. The patient was provided an opportunity to ask questions and all were answered. The patient agreed with the plan and demonstrated an understanding of the instructions.  Damaris Hippo , MD    CC: Shawnee Knapp, MD

## 2020-07-19 NOTE — Patient Instructions (Signed)
Dr Silverio Decamp  recommends that you complete a bowel purge (to clean out your bowels). Please do the following: Purchase a bottle of Miralax over the counter as well as a box of 5 mg dulcolax tablets. Take 4 dulcolax tablets. Wait 1 hour. You will then drink 6-8 capfuls of Miralax mixed in an adequate amount of water/juice/gatorade (you may choose which of these liquids to drink) over the next 2-3 hours. You should expect results within 1 to 6 hours after completing the bowel purge.  We will send Amitiza 8 mcg twice daily and Dicyclomine 20 mg to your pharmacy   Take OTC Melantonin 5 mg daily  Follow up in 4 months  Due to recent changes in healthcare laws, you may see the results of your imaging and laboratory studies on MyChart before your provider has had a chance to review them.  We understand that in some cases there may be results that are confusing or concerning to you. Not all laboratory results come back in the same time frame and the provider may be waiting for multiple results in order to interpret others.  Please give [place] 48 hours in order for your provider to thoroughly review all the results before contacting the office for clarification of your results.   I appreciate the  opportunity to care for you  Thank You   Harl Bowie , MD

## 2020-07-19 NOTE — Telephone Encounter (Signed)
Prior authorization approved for Amitiza until 07/19/2023

## 2020-07-19 NOTE — Addendum Note (Signed)
Addended by: Oda Kilts on: 07/19/2020 11:07 AM   Modules accepted: Orders

## 2020-07-27 ENCOUNTER — Ambulatory Visit: Payer: 59 | Admitting: Sports Medicine

## 2020-07-27 ENCOUNTER — Encounter: Payer: Self-pay | Admitting: Sports Medicine

## 2020-07-27 ENCOUNTER — Other Ambulatory Visit: Payer: Self-pay

## 2020-07-27 DIAGNOSIS — B351 Tinea unguium: Secondary | ICD-10-CM | POA: Diagnosis not present

## 2020-07-27 DIAGNOSIS — E1151 Type 2 diabetes mellitus with diabetic peripheral angiopathy without gangrene: Secondary | ICD-10-CM

## 2020-07-27 DIAGNOSIS — I739 Peripheral vascular disease, unspecified: Secondary | ICD-10-CM | POA: Diagnosis not present

## 2020-07-27 DIAGNOSIS — M79609 Pain in unspecified limb: Secondary | ICD-10-CM | POA: Diagnosis not present

## 2020-07-27 NOTE — Progress Notes (Signed)
Subjective: Maria Sharp is a 55 y.o. female patient with history of diabetes who returns to office today complaining of long,mildly painful nails  while ambulating in shoes; unable to trim. Patient states that her glucose has not been recorded, DIET CONTROLLED DIABETIC like before.   Patient Active Problem List   Diagnosis Date Noted  . Exposure to COVID-19 virus 08/26/2018  . Migraines 09/02/2017  . DJD (degenerative joint disease) 09/02/2017  . Vertigo 12/05/2016  . Bilateral temporomandibular joint pain 12/05/2016  . Constipation   . Asthma, chronic 09/28/2014  . Gastroparesis   . Varicose veins 09/14/2013  . Neck pain on left side 06/14/2013  . Current non-adherence to medical treatment 06/14/2013  . Vitamin D deficiency 10/05/2012  . Leg cramps 10/05/2012  . Family history of diabetes mellitus 10/05/2012  . Type 2 diabetes mellitus, uncontrolled ([place]) 05/28/2012  . 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 ABDOMINAL PAIN, UNSPECIFIED SITE 05/18/2010  . CHEST PAIN, INTERMITTENT 05/12/2009  . Obesity 04/05/2009  . CARDIOMEGALY 04/05/2009  . PALPITATIONS 04/05/2009  . Type 2 diabetes mellitus with peripheral circulatory disorder ([place]) 03/15/2009  . BACK PAIN 03/09/2008  . Hyperlipidemia LDL goal <100 03/03/2008  . Essential hypertension 03/03/2008  . GERD 03/03/2008  . IRRITABLE BOWEL SYNDROME 03/03/2008  . RECTAL BLEEDING 03/03/2008   Current Outpatient Medications on File Prior to Visit  Medication Sig Dispense Refill  . albuterol (PROVENTIL HFA;VENTOLIN HFA) 108 (90 Base) MCG/ACT inhaler INHALE 2 PUFFS Q 4 TO 6 H PRN  0  . ARNUITY ELLIPTA 100 MCG/ACT AEPB INHALE BY MOUTH ONCE PER DAY  6  . azelastine (ASTELIN) 0.1 % nasal spray U 1 TO 2 SPRAYS IEN BID  6  . blood glucose meter kit and supplies KIT Dispense based on patient and insurance preference. Use up to four times daily as directed. (FOR ICD-9 250.00, 250.01). 1 each 0  . celecoxib (CELEBREX) 100 MG capsule Take 100  mg by mouth as needed.    . chlorthalidone (HYGROTON) 25 MG tablet Take 1 tablet (25 mg total) by mouth daily. 90 tablet 0  . Cholecalciferol (D3-1000 PO) Take 1,000 mg by mouth daily.     Marland Kitchen dicyclomine (BENTYL) 20 MG tablet Take 1 tablet (20 mg total) by mouth 2 (two) times daily as needed for spasms. 60 tablet 2  . EPINEPHrine (EPI-PEN) 0.3 mg/0.3 mL DEVI Inject 0.3 mg into the muscle once as needed. For severe allergic reaction    . glucose blood test strip USE TO TEST 4 TIMES A DAY    . Lancets (ONETOUCH DELICA PLUS [id number]) MISC USE TO TEST UP TO 4 TIMES A DAY    . linaclotide (LINZESS) 72 MCG capsule Take 1 capsule (72 mcg total) by mouth daily before breakfast. (Patient taking differently: Take 72 mcg by mouth as needed.) 30 capsule 1  . lubiprostone (AMITIZA) 8 MCG capsule Take 1 capsule (8 mcg total) by mouth 2 (two) times daily with a meal. 60 capsule 3  . metFORMIN (GLUCOPHAGE-XR) 500 MG 24 hr tablet TAKE 4 TABLETS BY MOUTH ONCE DAILY WITH FOOD 360 tablet 3  . montelukast (SINGULAIR) 10 MG tablet Take 10 mg by mouth at bedtime.     . Multiple Vitamins-Minerals (ALIVE WOMENS 50+) TABS See admin instructions.    . Multiple Vitamins-Minerals (ZINC PO) Take 1 tablet by mouth daily.    Marland Kitchen olmesartan (BENICAR) 40 MG tablet Take 1 tablet (40 mg total) by mouth daily. 90 tablet 3  . omeprazole (PRILOSEC) 40  MG capsule TAKE 1 CAPSULE BY MOUTH EVERY DAY 30 capsule 3  . rosuvastatin (CRESTOR) 5 MG tablet Take 0.5 tablets (2.5 mg total) by mouth every other day. (Patient taking differently: Take 5 mg by mouth every other day.) 45 tablet 1  . spironolactone (ALDACTONE) 25 MG tablet Take 1 tablet (25 mg total) by mouth 2 (two) times daily. (Patient taking differently: Take 25 mg by mouth daily.) 180 tablet 1  . vitamin B-12 (CYANOCOBALAMIN) 1000 MCG tablet Take 1,000 mcg by mouth daily.    . vitamin E 400 UNIT capsule Take 400 Units by mouth daily.    . Zinc 100 MG TABS 1 tablet     No current  facility-administered medications on file prior to visit.   Allergies  Allergen Reactions  . Ace Inhibitors Anaphylaxis  . Shellfish Allergy Anaphylaxis and Other (See Comments)    Only shrimp  . Januvia [Sitagliptin] Other (See Comments)    Recurrent abdominal pain with elevated pancreas enzymes  . Latex Itching  . Lipitor [Atorvastatin]     No results found for this or any previous visit (from the past 2160 hour(s)).  Objective: General: Patient is awake, alert, and oriented x 3 and in no acute distress.  Integument: Skin is warm, dry and supple bilateral. Nails are tender, long, thickened and dystrophic with subungual debris, consistent with onychomycosis, 1-5 bilateral. No signs of infection. No open lesions or preulcerative lesions present bilateral. Callus sub met 1 on left. Remaining integument unremarkable.  Vasculature:  Dorsalis Pedis pulse 2/4 bilateral. Posterior Tibial pulse faint 1/4 bilateral. Capillary fill time <3 sec 1-5 bilateral. + scant hair growth to the level of the digits. Temperature gradient within normal limits. + varicosities present bilateral. Minimal edema present bilateral ankles.   Neurology: The patient has intact sensation measured with a 5.07/10g Semmes Weinstein Monofilament at all pedal sites bilateral.   Musculoskeletal: No symptomatic pedal deformities noted bilateral. + Pes planus. Muscular strength 5/5 in all lower extremity muscular groups bilateral without pain on range of motion. No tenderness with calf compression bilateral.  Assessment and Plan: Problem List Items Addressed This Visit   None   Visit Diagnoses    Pain due to onychomycosis of nail    -  Primary   Type II diabetes mellitus with peripheral circulatory disorder (HCC)       PVD (peripheral vascular disease) ([place])         -Examined patient. -Re-Discussed and educated patient on diabetic foot care and importance of daily inspection  -Mechnically debrided all nails 1-5  bilateral using sterile nail nipper and filed with dremel without incident  -Patient to return in 10 weeks for at risk foot care -Patient advised to call the office if any problems or questions arise in the meantime.  Landis Martins, DPM

## 2020-10-05 ENCOUNTER — Ambulatory Visit: Payer: 59 | Admitting: Sports Medicine

## 2020-10-27 ENCOUNTER — Other Ambulatory Visit: Payer: Self-pay | Admitting: Gastroenterology

## 2020-11-02 ENCOUNTER — Other Ambulatory Visit: Payer: Self-pay

## 2020-11-02 ENCOUNTER — Encounter: Payer: Self-pay | Admitting: Sports Medicine

## 2020-11-02 ENCOUNTER — Ambulatory Visit: Payer: 59 | Admitting: Sports Medicine

## 2020-11-02 DIAGNOSIS — M79609 Pain in unspecified limb: Secondary | ICD-10-CM

## 2020-11-02 DIAGNOSIS — B351 Tinea unguium: Secondary | ICD-10-CM | POA: Diagnosis not present

## 2020-11-02 DIAGNOSIS — E1151 Type 2 diabetes mellitus with diabetic peripheral angiopathy without gangrene: Secondary | ICD-10-CM | POA: Diagnosis not present

## 2020-11-02 DIAGNOSIS — I739 Peripheral vascular disease, unspecified: Secondary | ICD-10-CM

## 2020-11-02 NOTE — Progress Notes (Signed)
Subjective: Maria Sharp is a 55 y.o. female patient with history of diabetes who returns to office today complaining of long,mildly painful nails  while ambulating in shoes; unable to trim. Patient states that her glucose has not been recorded, DIET CONTROLLED DIABETIC like before. No other changes since last encounter.  Reports that she is getting married this weekend.  Patient Active Problem List   Diagnosis Date Noted   Exposure to COVID-19 virus 08/26/2018   Migraines 09/02/2017   DJD (degenerative joint disease) 09/02/2017   Vertigo 12/05/2016   Bilateral temporomandibular joint pain 12/05/2016   Constipation    Asthma, chronic 09/28/2014   Gastroparesis    Varicose veins 09/14/2013   Neck pain on left side 06/14/2013   Current non-adherence to medical treatment 06/14/2013   Vitamin D deficiency 10/05/2012   Leg cramps 10/05/2012   Family history of diabetes mellitus 10/05/2012   Type 2 diabetes mellitus, uncontrolled ([place]) 05/28/2012   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   ABDOMINAL PAIN, UNSPECIFIED SITE 05/18/2010   CHEST PAIN, INTERMITTENT 05/12/2009   Obesity 04/05/2009   CARDIOMEGALY 04/05/2009   PALPITATIONS 04/05/2009   Type 2 diabetes mellitus with peripheral circulatory disorder ([place]) 03/15/2009   BACK PAIN 03/09/2008   Hyperlipidemia LDL goal <100 03/03/2008   Essential hypertension 03/03/2008   GERD 03/03/2008   IRRITABLE BOWEL SYNDROME 03/03/2008   RECTAL BLEEDING 03/03/2008   Current Outpatient Medications on File Prior to Visit  Medication Sig Dispense Refill   albuterol (PROVENTIL HFA;VENTOLIN HFA) 108 (90 Base) MCG/ACT inhaler INHALE 2 PUFFS Q 4 TO 6 H PRN  0   ARNUITY ELLIPTA 100 MCG/ACT AEPB INHALE BY MOUTH ONCE PER DAY  6   azelastine (ASTELIN) 0.1 % nasal spray U 1 TO 2 SPRAYS IEN BID  6   blood glucose meter kit and supplies KIT Dispense based on patient and insurance preference. Use up to four times daily as directed. (FOR ICD-9 250.00, 250.01).  1 each 0   celecoxib (CELEBREX) 100 MG capsule Take 100 mg by mouth as needed.     chlorthalidone (HYGROTON) 25 MG tablet Take 1 tablet (25 mg total) by mouth daily. 90 tablet 0   Cholecalciferol (D3-1000 PO) Take 1,000 mg by mouth daily.      dicyclomine (BENTYL) 20 MG tablet Take 1 tablet (20 mg total) by mouth 2 (two) times daily as needed for spasms. 60 tablet 2   EPINEPHrine (EPI-PEN) 0.3 mg/0.3 mL DEVI Inject 0.3 mg into the muscle once as needed. For severe allergic reaction     glucose blood test strip USE TO TEST 4 TIMES A DAY     Lancets (ONETOUCH DELICA PLUS [id number]) MISC USE TO TEST UP TO 4 TIMES A DAY     linaclotide (LINZESS) 72 MCG capsule Take 1 capsule (72 mcg total) by mouth daily before breakfast. (Patient taking differently: Take 72 mcg by mouth as needed.) 30 capsule 1   lubiprostone (AMITIZA) 8 MCG capsule Take 1 capsule (8 mcg total) by mouth 2 (two) times daily with a meal. 60 capsule 3   metFORMIN (GLUCOPHAGE-XR) 500 MG 24 hr tablet TAKE 4 TABLETS BY MOUTH ONCE DAILY WITH FOOD 360 tablet 3   montelukast (SINGULAIR) 10 MG tablet Take 10 mg by mouth at bedtime.      Multiple Vitamins-Minerals (ALIVE WOMENS 50+) TABS See admin instructions.     Multiple Vitamins-Minerals (ZINC PO) Take 1 tablet by mouth daily.     olmesartan (BENICAR) 40 MG tablet Take 1 tablet (  40 mg total) by mouth daily. 90 tablet 3   omeprazole (PRILOSEC) 40 MG capsule TAKE 1 CAPSULE BY MOUTH EVERY DAY 30 capsule 3   rosuvastatin (CRESTOR) 5 MG tablet Take 0.5 tablets (2.5 mg total) by mouth every other day. (Patient taking differently: Take 5 mg by mouth every other day.) 45 tablet 1   spironolactone (ALDACTONE) 25 MG tablet Take 1 tablet (25 mg total) by mouth 2 (two) times daily. (Patient taking differently: Take 25 mg by mouth daily.) 180 tablet 1   vitamin B-12 (CYANOCOBALAMIN) 1000 MCG tablet Take 1,000 mcg by mouth daily.     vitamin E 400 UNIT capsule Take 400 Units by mouth daily.     Zinc 100  MG TABS 1 tablet     No current facility-administered medications on file prior to visit.   Allergies  Allergen Reactions   Ace Inhibitors Anaphylaxis   Shellfish Allergy Anaphylaxis and Other (See Comments)    Only shrimp   Januvia [Sitagliptin] Other (See Comments)    Recurrent abdominal pain with elevated pancreas enzymes   Latex Itching   Lipitor [Atorvastatin]     No results found for this or any previous visit (from the past 2160 hour(s)).  Objective: General: Patient is awake, alert, and oriented x 3 and in no acute distress.  Integument: Skin is warm, dry and supple bilateral. Nails are tender, long, thickened and dystrophic with subungual debris, consistent with onychomycosis, 1-5 bilateral. No signs of infection. No open lesions or preulcerative lesions present bilateral. Callus sub met 1 on left. Remaining integument unremarkable.  Vasculature:  Dorsalis Pedis pulse 2/4 bilateral. Posterior Tibial pulse faint 1/4 bilateral. Capillary fill time <3 sec 1-5 bilateral. + scant hair growth to the level of the digits. Temperature gradient within normal limits. + varicosities present bilateral. Minimal edema present bilateral ankles.   Neurology: The patient has intact sensation measured with a 5.07/10g Semmes Weinstein Monofilament at all pedal sites bilateral.   Musculoskeletal: No symptomatic pedal deformities noted bilateral. + Pes planus. Muscular strength 5/5 in all lower extremity muscular groups bilateral without pain on range of motion. No tenderness with calf compression bilateral.  Assessment and Plan: Problem List Items Addressed This Visit   None Visit Diagnoses     Pain due to onychomycosis of nail    -  Primary   Type II diabetes mellitus with peripheral circulatory disorder (HCC)       PVD (peripheral vascular disease) ([place])          -Examined patient. -Re-Discussed and educated patient on diabetic foot care and importance of daily inspection   -Mechnically debrided all nails 1-5 bilateral using sterile nail nipper and filed with dremel without incident  -Patient to return in 10 weeks for at risk foot care -Patient advised to call the office if any problems or questions arise in the meantime.  Landis Martins, DPM

## 2020-12-14 ENCOUNTER — Ambulatory Visit: Payer: 59 | Admitting: Sports Medicine

## 2020-12-26 ENCOUNTER — Other Ambulatory Visit: Payer: Self-pay | Admitting: Gastroenterology

## 2021-01-08 ENCOUNTER — Other Ambulatory Visit: Payer: Self-pay | Admitting: Obstetrics & Gynecology

## 2021-01-08 DIAGNOSIS — Z1231 Encounter for screening mammogram for malignant neoplasm of breast: Secondary | ICD-10-CM

## 2021-01-10 ENCOUNTER — Ambulatory Visit: Payer: 59

## 2021-01-12 ENCOUNTER — Other Ambulatory Visit: Payer: Self-pay | Admitting: Obstetrics & Gynecology

## 2021-01-12 DIAGNOSIS — N6320 Unspecified lump in the left breast, unspecified quadrant: Secondary | ICD-10-CM

## 2021-01-17 ENCOUNTER — Other Ambulatory Visit: Payer: Self-pay | Admitting: Obstetrics & Gynecology

## 2021-01-17 DIAGNOSIS — N6310 Unspecified lump in the right breast, unspecified quadrant: Secondary | ICD-10-CM

## 2021-01-24 ENCOUNTER — Other Ambulatory Visit: Payer: Self-pay | Admitting: Obstetrics & Gynecology

## 2021-01-24 DIAGNOSIS — N6310 Unspecified lump in the right breast, unspecified quadrant: Secondary | ICD-10-CM

## 2021-01-29 ENCOUNTER — Other Ambulatory Visit: Payer: Self-pay

## 2021-01-29 ENCOUNTER — Ambulatory Visit
Admission: RE | Admit: 2021-01-29 | Discharge: 2021-01-29 | Disposition: A | Discharge status: Home / Self Care | Payer: 59 | Source: Ambulatory Visit | Attending: Obstetrics & Gynecology | Admitting: Obstetrics & Gynecology

## 2021-01-29 DIAGNOSIS — N6310 Unspecified lump in the right breast, unspecified quadrant: Secondary | ICD-10-CM

## 2021-02-01 ENCOUNTER — Ambulatory Visit: Payer: 59 | Admitting: Sports Medicine

## 2021-02-01 ENCOUNTER — Other Ambulatory Visit: Payer: Self-pay

## 2021-02-01 ENCOUNTER — Encounter: Payer: Self-pay | Admitting: Sports Medicine

## 2021-02-01 DIAGNOSIS — J4530 Mild persistent asthma, uncomplicated: Secondary | ICD-10-CM | POA: Insufficient documentation

## 2021-02-01 DIAGNOSIS — J3081 Allergic rhinitis due to animal (cat) (dog) hair and dander: Secondary | ICD-10-CM | POA: Insufficient documentation

## 2021-02-01 DIAGNOSIS — B351 Tinea unguium: Secondary | ICD-10-CM | POA: Diagnosis not present

## 2021-02-01 DIAGNOSIS — M79609 Pain in unspecified limb: Secondary | ICD-10-CM | POA: Diagnosis not present

## 2021-02-01 DIAGNOSIS — I739 Peripheral vascular disease, unspecified: Secondary | ICD-10-CM

## 2021-02-01 DIAGNOSIS — J301 Allergic rhinitis due to pollen: Secondary | ICD-10-CM | POA: Insufficient documentation

## 2021-02-01 DIAGNOSIS — J309 Allergic rhinitis, unspecified: Secondary | ICD-10-CM | POA: Insufficient documentation

## 2021-02-01 DIAGNOSIS — Z91013 Allergy to seafood: Secondary | ICD-10-CM | POA: Insufficient documentation

## 2021-02-01 DIAGNOSIS — E1151 Type 2 diabetes mellitus with diabetic peripheral angiopathy without gangrene: Secondary | ICD-10-CM

## 2021-02-01 DIAGNOSIS — H1045 Other chronic allergic conjunctivitis: Secondary | ICD-10-CM | POA: Insufficient documentation

## 2021-02-01 NOTE — Progress Notes (Signed)
Subjective: Maria Sharp is a 55 y.o. female patient with history of diabetes who returns to office today complaining of long,mildly painful nails  while ambulating in shoes; unable to trim. Patient states that her glucose has not been recorded, DIET CONTROLLED DIABETIC like before. Reports that she bumped her 5th toe on rocking chair a few weeks ago left fifth toe was painful but now does not hurt.  No other pedal complaints noted.  Patient Active Problem List   Diagnosis Date Noted   Exposure to COVID-19 virus 08/26/2018   Migraines 09/02/2017   DJD (degenerative joint disease) 09/02/2017   Vertigo 12/05/2016   Bilateral temporomandibular joint pain 12/05/2016   Constipation    Asthma, chronic 09/28/2014   Gastroparesis    Varicose veins 09/14/2013   Neck pain on left side 06/14/2013   Current non-adherence to medical treatment 06/14/2013   Vitamin D deficiency 10/05/2012   Leg cramps 10/05/2012   Family history of diabetes mellitus 10/05/2012   Type 2 diabetes mellitus, uncontrolled 05/28/2012   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   ABDOMINAL PAIN, UNSPECIFIED SITE 05/18/2010   CHEST PAIN, INTERMITTENT 05/12/2009   Obesity 04/05/2009   CARDIOMEGALY 04/05/2009   PALPITATIONS 04/05/2009   Type 2 diabetes mellitus with peripheral circulatory disorder ([place]) 03/15/2009   BACK PAIN 03/09/2008   Hyperlipidemia LDL goal <100 03/03/2008   Essential hypertension 03/03/2008   GERD 03/03/2008   IRRITABLE BOWEL SYNDROME 03/03/2008   RECTAL BLEEDING 03/03/2008   Current Outpatient Medications on File Prior to Visit  Medication Sig Dispense Refill   albuterol (PROVENTIL HFA;VENTOLIN HFA) 108 (90 Base) MCG/ACT inhaler INHALE 2 PUFFS Q 4 TO 6 H PRN  0   ARNUITY ELLIPTA 100 MCG/ACT AEPB INHALE BY MOUTH ONCE PER DAY  6   azelastine (ASTELIN) 0.1 % nasal spray U 1 TO 2 SPRAYS IEN BID  6   blood glucose meter kit and supplies KIT Dispense based on patient and insurance preference. Use up to  four times daily as directed. (FOR ICD-9 250.00, 250.01). 1 each 0   celecoxib (CELEBREX) 100 MG capsule Take 100 mg by mouth as needed.     chlorthalidone (HYGROTON) 25 MG tablet Take 1 tablet (25 mg total) by mouth daily. 90 tablet 0   Cholecalciferol (D3-1000 PO) Take 1,000 mg by mouth daily.      dicyclomine (BENTYL) 20 MG tablet Take 1 tablet (20 mg total) by mouth 2 (two) times daily as needed for spasms. 60 tablet 2   EPINEPHrine (EPI-PEN) 0.3 mg/0.3 mL DEVI Inject 0.3 mg into the muscle once as needed. For severe allergic reaction     glucose blood test strip USE TO TEST 4 TIMES A DAY     Lancets (ONETOUCH DELICA PLUS [id number]) MISC USE TO TEST UP TO 4 TIMES A DAY     linaclotide (LINZESS) 72 MCG capsule Take 1 capsule (72 mcg total) by mouth daily before breakfast. (Patient taking differently: Take 72 mcg by mouth as needed.) 30 capsule 1   lubiprostone (AMITIZA) 8 MCG capsule Take 1 capsule (8 mcg total) by mouth 2 (two) times daily with a meal. 60 capsule 3   metFORMIN (GLUCOPHAGE-XR) 500 MG 24 hr tablet TAKE 4 TABLETS BY MOUTH ONCE DAILY WITH FOOD 360 tablet 3   montelukast (SINGULAIR) 10 MG tablet Take 10 mg by mouth at bedtime.      Multiple Vitamins-Minerals (ALIVE WOMENS 50+) TABS See admin instructions.     Multiple Vitamins-Minerals (ZINC PO) Take 1 tablet by  mouth daily.     olmesartan (BENICAR) 40 MG tablet Take 1 tablet (40 mg total) by mouth daily. 90 tablet 3   omeprazole (PRILOSEC) 40 MG capsule TAKE 1 CAPSULE BY MOUTH EVERY DAY 30 capsule 3   rosuvastatin (CRESTOR) 5 MG tablet Take 0.5 tablets (2.5 mg total) by mouth every other day. (Patient taking differently: Take 5 mg by mouth every other day.) 45 tablet 1   spironolactone (ALDACTONE) 25 MG tablet Take 1 tablet (25 mg total) by mouth 2 (two) times daily. (Patient taking differently: Take 25 mg by mouth daily.) 180 tablet 1   vitamin B-12 (CYANOCOBALAMIN) 1000 MCG tablet Take 1,000 mcg by mouth daily.     vitamin E 400  UNIT capsule Take 400 Units by mouth daily.     Zinc 100 MG TABS 1 tablet     No current facility-administered medications on file prior to visit.   Allergies  Allergen Reactions   Ace Inhibitors Anaphylaxis   Shellfish Allergy Anaphylaxis and Other (See Comments)    Only shrimp   Januvia [Sitagliptin] Other (See Comments)    Recurrent abdominal pain with elevated pancreas enzymes   Latex Itching   Lipitor [Atorvastatin]     No results found for this or any previous visit (from the past 2160 hour(s)).  Objective: General: Patient is awake, alert, and oriented x 3 and in no acute distress.  Integument: Skin is warm, dry and supple bilateral. Nails are tender, long, thickened and dystrophic with subungual debris, consistent with onychomycosis, 1-5 bilateral. No signs of infection. No open lesions or preulcerative lesions present bilateral. Callus sub met 1 on left. Remaining integument unremarkable.  Vasculature:  Dorsalis Pedis pulse 2/4 bilateral. Posterior Tibial pulse faint 1/4 bilateral. Capillary fill time <3 sec 1-5 bilateral. + scant hair growth to the level of the digits. Temperature gradient within normal limits. + varicosities present bilateral. Minimal edema present bilateral ankles.   Neurology: The patient has intact sensation measured with a 5.07/10g Semmes Weinstein Monofilament at all pedal sites bilateral.   Musculoskeletal: No symptomatic pedal deformities noted bilateral.  No pain to palpation to left fifth toe.  Unchanged hammertoe deformity.  + Pes planus. Muscular strength 5/5 in all lower extremity muscular groups bilateral without pain on range of motion. No tenderness with calf compression bilateral.  Assessment and Plan: Problem List Items Addressed This Visit   None Visit Diagnoses     Pain due to onychomycosis of nail    -  Primary   Type II diabetes mellitus with peripheral circulatory disorder (HCC)       PVD (peripheral vascular disease) ([place])           -Examined patient. -Re-Discussed and educated patient on diabetic foot care and importance of daily inspection  -Mechnically debrided all nails 1-5 bilateral using sterile nail nipper and filed with dremel without incident  Advised patient at this time we will continue to monitor left fifth toes and is nonpainful -Patient to return in 10 weeks for at risk foot care -Patient advised to call the office if any problems or questions arise in the meantime.  Landis Martins, DPM

## 2021-02-22 ENCOUNTER — Ambulatory Visit: Payer: 59 | Admitting: Sports Medicine

## 2021-03-26 ENCOUNTER — Ambulatory Visit (INDEPENDENT_AMBULATORY_CARE_PROVIDER_SITE_OTHER): Payer: 59 | Admitting: Gastroenterology

## 2021-03-26 ENCOUNTER — Encounter: Payer: Self-pay | Admitting: Gastroenterology

## 2021-03-26 VITALS — BP 118/76 | HR 88 | Ht 70.25 in | Wt 254.2 lb

## 2021-03-26 DIAGNOSIS — K581 Irritable bowel syndrome with constipation: Secondary | ICD-10-CM

## 2021-03-26 DIAGNOSIS — K219 Gastro-esophageal reflux disease without esophagitis: Secondary | ICD-10-CM | POA: Diagnosis not present

## 2021-03-26 DIAGNOSIS — K3184 Gastroparesis: Secondary | ICD-10-CM | POA: Diagnosis not present

## 2021-03-26 MED ORDER — OMEPRAZOLE 40 MG PO CPDR: 40.0000 mg | DELAYED_RELEASE_CAPSULE | Freq: Two times a day (BID) | ORAL | 3 refills | Status: DC

## 2021-03-26 MED ORDER — DICYCLOMINE HCL 20 MG PO TABS: 20.0000 mg | ORAL_TABLET | Freq: Three times a day (TID) | ORAL | 2 refills | Status: AC

## 2021-03-26 MED ORDER — LINACLOTIDE 145 MCG PO CAPS: 145.0000 ug | ORAL_CAPSULE | Freq: Every day | ORAL | 3 refills | Status: DC

## 2021-03-26 MED ORDER — SIMETHICONE 80 MG PO TABS: 1.0000 | ORAL_TABLET | Freq: Three times a day (TID) | ORAL | 3 refills | Status: DC

## 2021-03-26 NOTE — Patient Instructions (Signed)
[facility] sent your prescriptions to your pharmacy  Eat small frequent meals  Increase water intake 8-10 cups daily  If you are age 55 or older, your body mass index should be between 23-30. Your Body mass index is 36.22 kg/m. If this is out of the aforementioned range listed, please consider follow up with your Primary Care Provider.  If you are age 21 or younger, your body mass index should be between 19-25. Your Body mass index is 36.22 kg/m. If this is out of the aformentioned range listed, please consider follow up with your Primary Care Provider.   ________________________________________________________  The [facility] providers would like to encourage you to use [facility] to communicate with providers for non-urgent requests or questions.  Due to long hold times on the telephone, sending your provider a message by [facility] may be a faster and more efficient way to get a response.  Please allow 48 business hours for a response.  Please remember that this is for non-urgent requests.  _______________________________________________________   I appreciate the  opportunity to care for you  Thank You   Harl Bowie , MD

## 2021-03-26 NOTE — Progress Notes (Signed)
Maria Sharp    [id number]    Dec 09, 1965  Primary Care Physician:Wolters, Ivin Booty, MD  Referring Physician: Jonathon Jordan, MD [place],  [place] [address]   Chief complaint:  IBS  HPI:  55 yr very pleasant F follow-up visit for chronic irritable bowel syndrome with alternating constipation and diarrhea.   She continues to have irregular bowel habits with alternating diarrhea and constipation, feels IBS symptoms gotten worse after she started new medication Rybelsus but have seem to have settled in the past few weeks.  She is using Linzess and dicyclomine as needed She has abdominal bloating and excessive gas.  She cut down on cheese and milk. No dysphagia, vomiting, weight loss or decreased appetite    Colonoscopy March 26, 2018 with removal of 2 small subcentimeter sessile tubular adenomatous polyps and internal hemorrhoids   Outpatient Encounter Medications as of 03/26/2021  Medication Sig   albuterol (PROVENTIL HFA;VENTOLIN HFA) 108 (90 Base) MCG/ACT inhaler INHALE 2 PUFFS Q 4 TO 6 H PRN   ARNUITY ELLIPTA 100 MCG/ACT AEPB INHALE BY MOUTH ONCE PER DAY   azelastine (ASTELIN) 0.1 % nasal spray U 1 TO 2 SPRAYS IEN BID   azelastine (OPTIVAR) 0.05 % ophthalmic solution 1 drop into affected eye   blood glucose meter kit and supplies KIT Dispense based on patient and insurance preference. Use up to four times daily as directed. (FOR ICD-9 250.00, 250.01).   celecoxib (CELEBREX) 100 MG capsule Take 100 mg by mouth as needed.   chlorthalidone (HYGROTON) 25 MG tablet Take 1 tablet (25 mg total) by mouth daily.   Cholecalciferol (D3-1000 PO) Take 1,000 mg by mouth daily.    dicyclomine (BENTYL) 20 MG tablet Take 1 tablet (20 mg total) by mouth 2 (two) times daily as needed for spasms.   EPINEPHrine (EPI-PEN) 0.3 mg/0.3 mL DEVI Inject 0.3 mg into the muscle once as needed. For severe allergic reaction   EPINEPHrine 0.3 mg/0.3 mL IJ SOAJ  injection See admin instructions.   glucose blood test strip USE TO TEST 4 TIMES A DAY   Lancets (ONETOUCH DELICA PLUS [id number]) MISC USE TO TEST UP TO 4 TIMES A DAY   levocetirizine (XYZAL) 5 MG tablet 1 tablet in the evening   linaclotide (LINZESS) 72 MCG capsule Take 1 capsule (72 mcg total) by mouth daily before breakfast. (Patient taking differently: Take 72 mcg by mouth as needed.)   lubiprostone (AMITIZA) 8 MCG capsule Take 1 capsule (8 mcg total) by mouth 2 (two) times daily with a meal.   metFORMIN (GLUCOPHAGE-XR) 500 MG 24 hr tablet TAKE 4 TABLETS BY MOUTH ONCE DAILY WITH FOOD   montelukast (SINGULAIR) 10 MG tablet Take 10 mg by mouth at bedtime.    montelukast (SINGULAIR) 10 MG tablet 1 tablet in the evening   Multiple Vitamins-Minerals (ALIVE WOMENS 50+) TABS See admin instructions.   Multiple Vitamins-Minerals (ZINC PO) Take 1 tablet by mouth daily.   olmesartan (BENICAR) 40 MG tablet Take 1 tablet (40 mg total) by mouth daily.   omeprazole (PRILOSEC) 40 MG capsule TAKE 1 CAPSULE BY MOUTH EVERY DAY   rosuvastatin (CRESTOR) 5 MG tablet Take 0.5 tablets (2.5 mg total) by mouth every other day. (Patient taking differently: Take 5 mg by mouth every other day.)   RYBELSUS 3 MG TABS Take 1 tablet by mouth every morning.   spironolactone (ALDACTONE) 25 MG tablet Take 1 tablet (25 mg total)  by mouth 2 (two) times daily. (Patient taking differently: Take 25 mg by mouth daily.)   tiZANidine (ZANAFLEX) 4 MG capsule Take 4 mg by mouth 3 (three) times daily.   vitamin B-12 (CYANOCOBALAMIN) 1000 MCG tablet Take 1,000 mcg by mouth daily.   vitamin E 400 UNIT capsule Take 400 Units by mouth daily.   Zinc 100 MG TABS 1 tablet   No facility-administered encounter medications on file as of 03/26/2021.    Allergies as of 03/26/2021 - Review Complete 03/26/2021  Allergen Reaction Noted   Ace inhibitors Anaphylaxis 03/03/2008   Shellfish allergy Anaphylaxis and Other (See Comments) 05/25/2011    Januvia [sitagliptin] Other (See Comments) 10/05/2012   Latex Itching    Lipitor [atorvastatin]  02/10/2013    Past Medical History:  Diagnosis Date   Allergy    Asthma    Diabetes mellitus    Fatty liver    Gastritis    Gastroparesis    study 11 14  nl abd [place]   GERD (gastroesophageal reflux disease)    High cholesterol    History of varicella    Hypertension    IBS (irritable bowel syndrome)     Past Surgical History:  Procedure Laterality Date   ANAL RECTAL MANOMETRY N/A 04/03/2015   Procedure: ANO RECTAL MANOMETRY;  Surgeon: Mauri Pole, MD;  Location: [facility];  Service: Endoscopy;  Laterality: N/A;   CHOLECYSTECTOMY     WISDOM TOOTH EXTRACTION      Family History  Problem Relation Age of Onset   Prostate cancer Father    Diabetes Father    Hypertension Father    Diabetes Mother    Heart disease Mother    Arthritis Mother    Hyperlipidemia Mother    Hypertension Mother    Parkinson's disease Mother    Thyroid disease Mother    Diabetes Paternal Grandmother    Arthritis Paternal Grandmother    Hyperlipidemia Paternal Grandmother    Heart disease Paternal Grandmother    Dementia Paternal Grandmother    Hypertension Paternal Grandmother    Hyperlipidemia Maternal Grandmother    Hypertension Maternal Grandmother    Hyperlipidemia Maternal Grandfather    Hypertension Maternal Grandfather    Hyperlipidemia Paternal Grandfather    Hypertension Paternal Grandfather    Diabetes Paternal Grandfather    Cholelithiasis Paternal Grandfather    Colon cancer Neg Hx    Esophageal cancer Neg Hx    Stomach cancer Neg Hx    Rectal cancer Neg Hx     Social History   Socioeconomic History   Marital status: Significant Other    Spouse name: Not on file   Number of children: 0   Years of education: Not on file   Highest education level: Not on file  Occupational History   Occupation: Post office  Tobacco Use   Smoking status: Never   Smokeless tobacco:  Never  Vaping Use   Vaping Use: Never used  Substance and Sexual Activity   Alcohol use: No   Drug use: No   Sexual activity: Not on file  Other Topics Concern   Not on file  Social History Narrative   Usually # of hours of sleep per night: 6   3 of people living at your residence? 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   Irregular sleep 7:30 to 11 or 12 and  then 4 to 6:30 before she goes to work   Has attended college.   Originally from [place]   Abnormal pap remote last 2012    Social Determinants of Health   Financial Resource Strain: Not on file  Food Insecurity: Not on file  Transportation Needs: Not on file  Physical Activity: Not on file  Stress: Not on file  Social Connections: Not on file  Intimate Partner Violence: Not on file      Review of systems: All other review of systems negative except as mentioned in the HPI.   Physical Exam: Vitals:   03/26/21 0834  BP: 118/76  Pulse: 88   Body mass index is 36.22 kg/m. Gen:      No acute distress HEENT:  sclera anicteric Abd:      soft, non-tender; no palpable masses, no distension Ext:    No edema Neuro: alert and oriented x 3 Psych: normal mood and affect  Data Reviewed:  Reviewed labs, radiology imaging, old records and pertinent past GI work up   Assessment and Plan/Recommendations:  55 year old very pleasant female with chronic irritable bowel syndrome with alternating constipation and diarrhea Benefiber 1 tablespoon 2-3 times daily with meals Use Linzess 145 mcg daily Increase dietary fiber and fluid intake    Use dicyclomine 20 mg twice daily as needed for abdominal cramping and bloating Melatonin 5 mg daily (she works third shift and her sleep cycle is altered)     GERD: Persistent breakthrough heartburn, will increase to omeprazole 40 mg twice daily and discussed  antireflux measures   Return in 4 months or sooner if needed  This visit required 40 minutes of patient care (this includes precharting, chart review, review of results, face-to-face time used for counseling as well as treatment plan and follow-up. The patient was provided an opportunity to ask questions and all were answered. The patient agreed with the plan and demonstrated an understanding of the instructions.  Damaris Hippo , MD    CC: Jonathon Jordan, MD

## 2021-03-29 ENCOUNTER — Ambulatory Visit: Payer: 59 | Admitting: Orthopedic Surgery

## 2021-04-08 ENCOUNTER — Encounter: Payer: Self-pay | Admitting: Gastroenterology

## 2021-04-12 ENCOUNTER — Other Ambulatory Visit: Payer: Self-pay

## 2021-04-12 ENCOUNTER — Ambulatory Visit: Payer: 59 | Admitting: Sports Medicine

## 2021-04-12 ENCOUNTER — Encounter: Payer: Self-pay | Admitting: Sports Medicine

## 2021-04-12 DIAGNOSIS — M79609 Pain in unspecified limb: Secondary | ICD-10-CM

## 2021-04-12 DIAGNOSIS — I739 Peripheral vascular disease, unspecified: Secondary | ICD-10-CM | POA: Diagnosis not present

## 2021-04-12 DIAGNOSIS — B351 Tinea unguium: Secondary | ICD-10-CM | POA: Diagnosis not present

## 2021-04-12 DIAGNOSIS — E1151 Type 2 diabetes mellitus with diabetic peripheral angiopathy without gangrene: Secondary | ICD-10-CM | POA: Diagnosis not present

## 2021-04-12 DIAGNOSIS — E119 Type 2 diabetes mellitus without complications: Secondary | ICD-10-CM

## 2021-04-12 NOTE — Progress Notes (Signed)
Subjective: Maria Sharp is a 55 y.o. female patient with history of diabetes who returns to office today complaining of long,mildly painful nails  while ambulating in shoes; unable to trim. Patient states that her glucose has not been recorded, DIET CONTROLLED DIABETIC like before.No other pedal complaints noted.  Patient Active Problem List   Diagnosis Date Noted   Allergic rhinitis 02/01/2021   Allergic rhinitis due to animal (cat) (dog) hair and dander 02/01/2021   Allergic rhinitis due to pollen 02/01/2021   Chronic allergic conjunctivitis 02/01/2021   Mild persistent asthma, uncomplicated 58/12/9831   Seafood allergy 02/01/2021   Exposure to COVID-19 virus 08/26/2018   Migraines 09/02/2017   DJD (degenerative joint disease) 09/02/2017   Vertigo 12/05/2016   Bilateral temporomandibular joint pain 12/05/2016   Constipation    Asthma, chronic 09/28/2014   Gastroparesis    Varicose veins 09/14/2013   Neck pain on left side 06/14/2013   Current non-adherence to medical treatment 06/14/2013   Vitamin D deficiency 10/05/2012   Leg cramps 10/05/2012   Family history of diabetes mellitus 10/05/2012   Type 2 diabetes mellitus, uncontrolled 05/28/2012   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   ABDOMINAL PAIN, UNSPECIFIED SITE 05/18/2010   CHEST PAIN, INTERMITTENT 05/12/2009   Obesity 04/05/2009   CARDIOMEGALY 04/05/2009   PALPITATIONS 04/05/2009   Type 2 diabetes mellitus with peripheral circulatory disorder ([place]) 03/15/2009   BACK PAIN 03/09/2008   Hyperlipidemia LDL goal <100 03/03/2008   Essential hypertension 03/03/2008   GERD 03/03/2008   IRRITABLE BOWEL SYNDROME 03/03/2008   RECTAL BLEEDING 03/03/2008   Current Outpatient Medications on File Prior to Visit  Medication Sig Dispense Refill   albuterol (PROVENTIL HFA;VENTOLIN HFA) 108 (90 Base) MCG/ACT inhaler INHALE 2 PUFFS Q 4 TO 6 H PRN  0   azelastine (ASTELIN) 0.1 % nasal spray U 1 TO 2 SPRAYS IEN BID  6   azelastine  (OPTIVAR) 0.05 % ophthalmic solution 1 drop into affected eye     blood glucose meter kit and supplies KIT Dispense based on patient and insurance preference. Use up to four times daily as directed. (FOR ICD-9 250.00, 250.01). 1 each 0   celecoxib (CELEBREX) 100 MG capsule Take 100 mg by mouth as needed.     chlorthalidone (HYGROTON) 25 MG tablet Take 1 tablet (25 mg total) by mouth daily. 90 tablet 0   Cholecalciferol (D3-1000 PO) Take 1,000 mg by mouth daily.      dicyclomine (BENTYL) 20 MG tablet Take 1 tablet (20 mg total) by mouth 3 (three) times daily before meals. 90 tablet 2   EPINEPHrine (EPI-PEN) 0.3 mg/0.3 mL DEVI Inject 0.3 mg into the muscle once as needed. For severe allergic reaction (Patient not taking: Reported on 03/26/2021)     FLOVENT HFA 110 MCG/ACT inhaler Inhale 2 puffs into the lungs 2 (two) times daily.     glucose blood test strip USE TO TEST 4 TIMES A DAY     Lancets (ONETOUCH DELICA PLUS [id number]) MISC USE TO TEST UP TO 4 TIMES A DAY     linaclotide (LINZESS) 145 MCG CAPS capsule Take 1 capsule (145 mcg total) by mouth daily before breakfast. 90 capsule 3   montelukast (SINGULAIR) 10 MG tablet 1 tablet in the evening     Multiple Vitamins-Minerals (ALIVE WOMENS 50+) TABS See admin instructions.     Multiple Vitamins-Minerals (HAIR VITAMINS PO) Take by mouth. Vivastol     olmesartan (BENICAR) 40 MG tablet Take 1 tablet (40 mg total) by  mouth daily. 90 tablet 3   omeprazole (PRILOSEC) 40 MG capsule Take 1 capsule (40 mg total) by mouth in the morning and at bedtime. 60 capsule 3   rosuvastatin (CRESTOR) 5 MG tablet Take 0.5 tablets (2.5 mg total) by mouth every other day. (Patient taking differently: Take 5 mg by mouth 2 (two) times a week.) 45 tablet 1   RYBELSUS 3 MG TABS Take 1 tablet by mouth every morning.     Simethicone 80 MG TABS Take 1 tablet (80 mg total) by mouth 3 (three) times daily. 90 tablet 3   spironolactone (ALDACTONE) 25 MG tablet Take 1 tablet (25 mg  total) by mouth 2 (two) times daily. (Patient taking differently: Take 25 mg by mouth daily.) 180 tablet 1   vitamin B-12 (CYANOCOBALAMIN) 1000 MCG tablet Take 1,000 mcg by mouth daily.     vitamin E 400 UNIT capsule Take 400 Units by mouth daily.     Zinc 100 MG TABS 1 tablet     No current facility-administered medications on file prior to visit.   Allergies  Allergen Reactions   Ace Inhibitors Anaphylaxis   Shellfish Allergy Anaphylaxis and Other (See Comments)    Only shrimp   Januvia [Sitagliptin] Other (See Comments)    Recurrent abdominal pain with elevated pancreas enzymes   Latex Itching   Lipitor [Atorvastatin]     No results found for this or any previous visit (from the past 2160 hour(s)).  Objective: General: Patient is awake, alert, and oriented x 3 and in no acute distress.  Integument: Skin is warm, dry and supple bilateral. Nails are tender, long, thickened and dystrophic with subungual debris, consistent with onychomycosis, 1-5 bilateral. No signs of infection. No open lesions or preulcerative lesions present bilateral. Minimal Callus sub met 1 on left. Remaining integument unremarkable.  Vasculature:  Dorsalis Pedis pulse 2/4 bilateral. Posterior Tibial pulse faint 1/4 bilateral. Capillary fill time <3 sec 1-5 bilateral. + scant hair growth to the level of the digits. Temperature gradient within normal limits. + varicosities present bilateral. Minimal edema present bilateral ankles.   Neurology: The patient has intact sensation measured with a 5.07/10g Semmes Weinstein Monofilament at all pedal sites bilateral.   Musculoskeletal: No symptomatic pedal deformities noted bilateral.  No pain to palpation to left fifth toe.  Unchanged hammertoe deformity.  + Pes planus. Muscular strength 5/5 in all lower extremity muscular groups bilateral without pain on range of motion. No tenderness with calf compression bilateral.  Assessment and Plan: Problem List Items Addressed  This Visit   None Visit Diagnoses     Pain due to onychomycosis of nail    -  Primary   Type II diabetes mellitus with peripheral circulatory disorder (HCC)       PVD (peripheral vascular disease) (HCC)       Diabetes mellitus without complication ([place])          -Examined patient. -Re-Discussed and educated patient on diabetic foot care and importance of daily inspection  -Mechnically debrided all nails 1-5 bilateral using sterile nail nipper and filed with dremel without incident  -Patient to return in 10 weeks for at risk foot care -Patient advised to call the office if any problems or questions arise in the meantime.  Landis Martins, DPM

## 2021-04-30 ENCOUNTER — Ambulatory Visit: Payer: 59 | Admitting: Orthopedic Surgery

## 2021-06-11 ENCOUNTER — Other Ambulatory Visit: Payer: Self-pay

## 2021-06-11 ENCOUNTER — Ambulatory Visit: Payer: 59 | Admitting: Orthopedic Surgery

## 2021-06-28 ENCOUNTER — Other Ambulatory Visit: Payer: Self-pay

## 2021-06-28 ENCOUNTER — Encounter: Payer: Self-pay | Admitting: Sports Medicine

## 2021-06-28 ENCOUNTER — Ambulatory Visit: Payer: 59 | Admitting: Sports Medicine

## 2021-06-28 DIAGNOSIS — M79609 Pain in unspecified limb: Secondary | ICD-10-CM

## 2021-06-28 DIAGNOSIS — E1151 Type 2 diabetes mellitus with diabetic peripheral angiopathy without gangrene: Secondary | ICD-10-CM

## 2021-06-28 DIAGNOSIS — B351 Tinea unguium: Secondary | ICD-10-CM | POA: Diagnosis not present

## 2021-06-28 NOTE — Progress Notes (Signed)
Subjective: ?Maria Sharp is a 56 y.o. female patient with history of diabetes who returns to office today complaining of long,mildly painful nails  while ambulating in shoes; unable to trim. Patient states that her glucose has not been recorded, DIET CONTROLLED DIABETIC like before.not sure of last A1c.  No other pedal complaints noted. ? ?Jonathon Jordan, MD last visit 2/ 6/23 ? ? ?Patient Active Problem List  ? Diagnosis Date Noted  ? Allergic rhinitis 02/01/2021  ? Allergic rhinitis due to animal (cat) (dog) hair and dander 02/01/2021  ? Allergic rhinitis due to pollen 02/01/2021  ? Chronic allergic conjunctivitis 02/01/2021  ? Mild persistent asthma, uncomplicated 16/01/9603  ? Seafood allergy 02/01/2021  ? Exposure to COVID-19 virus 08/26/2018  ? Migraines 09/02/2017  ? DJD (degenerative joint disease) 09/02/2017  ? Vertigo 12/05/2016  ? Bilateral temporomandibular joint pain 12/05/2016  ? Constipation   ? Asthma, chronic 09/28/2014  ? Gastroparesis   ? Varicose veins 09/14/2013  ? Neck pain on left side 06/14/2013  ? Current non-adherence to medical treatment 06/14/2013  ? Vitamin D deficiency 10/05/2012  ? Leg cramps 10/05/2012  ? Family history of diabetes mellitus 10/05/2012  ? Type 2 diabetes mellitus, uncontrolled 05/28/2012  ? 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  ? ABDOMINAL PAIN, UNSPECIFIED SITE 05/18/2010  ? CHEST PAIN, INTERMITTENT 05/12/2009  ? Obesity 04/05/2009  ? CARDIOMEGALY 04/05/2009  ? PALPITATIONS 04/05/2009  ? Type 2 diabetes mellitus with peripheral circulatory disorder ([place]) 03/15/2009  ? BACK PAIN 03/09/2008  ? Hyperlipidemia LDL goal <100 03/03/2008  ? Essential hypertension 03/03/2008  ? GERD 03/03/2008  ? IRRITABLE BOWEL SYNDROME 03/03/2008  ? RECTAL BLEEDING 03/03/2008  ? ?Current Outpatient Medications on File Prior to Visit  ?Medication Sig Dispense Refill  ? albuterol (PROVENTIL HFA;VENTOLIN HFA) 108 (90 Base) MCG/ACT inhaler INHALE 2 PUFFS Q 4 TO 6 H PRN  0  ? azelastine  (ASTELIN) 0.1 % nasal spray U 1 TO 2 SPRAYS IEN BID  6  ? azelastine (OPTIVAR) 0.05 % ophthalmic solution 1 drop into affected eye    ? blood glucose meter kit and supplies KIT Dispense based on patient and insurance preference. Use up to four times daily as directed. (FOR ICD-9 250.00, 250.01). 1 each 0  ? celecoxib (CELEBREX) 100 MG capsule Take 100 mg by mouth as needed.    ? chlorthalidone (HYGROTON) 25 MG tablet Take 1 tablet (25 mg total) by mouth daily. 90 tablet 0  ? Cholecalciferol (D3-1000 PO) Take 1,000 mg by mouth daily.     ? dicyclomine (BENTYL) 20 MG tablet Take 1 tablet (20 mg total) by mouth 3 (three) times daily before meals. 90 tablet 2  ? EPINEPHrine (EPI-PEN) 0.3 mg/0.3 mL DEVI Inject 0.3 mg into the muscle once as needed. For severe allergic reaction (Patient not taking: Reported on 03/26/2021)    ? FLOVENT HFA 110 MCG/ACT inhaler Inhale 2 puffs into the lungs 2 (two) times daily.    ? glucose blood test strip USE TO TEST 4 TIMES A DAY    ? Lancets (ONETOUCH DELICA PLUS [id number]) MISC USE TO TEST UP TO 4 TIMES A DAY    ? linaclotide (LINZESS) 145 MCG CAPS capsule Take 1 capsule (145 mcg total) by mouth daily before breakfast. 90 capsule 3  ? montelukast (SINGULAIR) 10 MG tablet 1 tablet in the evening    ? Multiple Vitamins-Minerals (ALIVE WOMENS 50+) TABS See admin instructions.    ? Multiple Vitamins-Minerals (HAIR VITAMINS PO) Take by mouth. Vivastol    ?  olmesartan (BENICAR) 40 MG tablet Take 1 tablet (40 mg total) by mouth daily. 90 tablet 3  ? omeprazole (PRILOSEC) 40 MG capsule Take 1 capsule (40 mg total) by mouth in the morning and at bedtime. 60 capsule 3  ? rosuvastatin (CRESTOR) 5 MG tablet Take 0.5 tablets (2.5 mg total) by mouth every other day. (Patient taking differently: Take 5 mg by mouth 2 (two) times a week.) 45 tablet 1  ? RYBELSUS 3 MG TABS Take 1 tablet by mouth every morning.    ? Simethicone 80 MG TABS Take 1 tablet (80 mg total) by mouth 3 (three) times daily. 90  tablet 3  ? spironolactone (ALDACTONE) 25 MG tablet Take 1 tablet (25 mg total) by mouth 2 (two) times daily. (Patient taking differently: Take 25 mg by mouth daily.) 180 tablet 1  ? vitamin B-12 (CYANOCOBALAMIN) 1000 MCG tablet Take 1,000 mcg by mouth daily.    ? vitamin E 400 UNIT capsule Take 400 Units by mouth daily.    ? Zinc 100 MG TABS 1 tablet    ? ?No current facility-administered medications on file prior to visit.  ? ?Allergies  ?Allergen Reactions  ? Ace Inhibitors Anaphylaxis  ? Shellfish Allergy Anaphylaxis and Other (See Comments)  ?  Only shrimp  ? Januvia [Sitagliptin] Other (See Comments)  ?  Recurrent abdominal pain with elevated pancreas enzymes  ? Latex Itching  ? Lipitor [Atorvastatin]   ? ? ?No results found for this or any previous visit (from the past 2160 hour(s)). ? ?Objective: ?General: Patient is awake, alert, and oriented x 3 and in no acute distress. ? ?Integument: Skin is warm, dry and supple bilateral. Nails are tender, long, thickened and dystrophic with subungual debris, consistent with onychomycosis, 1-5 bilateral. No signs of infection. No open lesions or preulcerative lesions present bilateral. Minimal Callus sub met 1 on left. Remaining integument unremarkable. ? ?Vasculature:  Dorsalis Pedis pulse 2/4 bilateral. Posterior Tibial pulse faint 1/4 bilateral. Capillary fill time <3 sec 1-5 bilateral. + scant hair growth to the level of the digits. ?Temperature gradient within normal limits. + varicosities present bilateral. Minimal edema present bilateral ankles unchanged from prior.  ? ?Neurology: Gross sensation present via light touch bilateral.  ? ?Musculoskeletal: No symptomatic pedal deformities noted bilateral.  No pain to palpation to left fifth toe.  Unchanged hammertoe deformity.  + Pes planus. Muscular strength 5/5 in all lower extremity muscular groups bilateral without pain on range of motion. No tenderness with calf compression bilateral. ? ?Assessment and  Plan: ?Problem List Items Addressed This Visit   ?None ?Visit Diagnoses   ? ? Pain due to onychomycosis of nail    -  Primary  ? Type II diabetes mellitus with peripheral circulatory disorder (HCC)      ? ?  ? ?-Examined patient. ?-Re-Discussed and educated patient on diabetic foot care and importance of daily inspection  ?-Mechnically debrided all painful nails 1-5 bilateral using sterile nail nipper and filed with dremel without incident  ?-Patient to return in 10 to 12 weeks for at risk foot care ?-Patient advised to call the office if any problems or questions arise in the meantime. ? ?Landis Martins, DPM    ?

## 2021-07-04 ENCOUNTER — Ambulatory Visit: Payer: 59 | Admitting: Orthopedic Surgery

## 2021-08-20 ENCOUNTER — Ambulatory Visit: Payer: 59 | Admitting: Orthopedic Surgery

## 2021-08-29 ENCOUNTER — Encounter (INDEPENDENT_AMBULATORY_CARE_PROVIDER_SITE_OTHER): Payer: 59 | Admitting: Ophthalmology

## 2021-08-29 DIAGNOSIS — H43813 Vitreous degeneration, bilateral: Secondary | ICD-10-CM | POA: Diagnosis not present

## 2021-08-29 DIAGNOSIS — H35033 Hypertensive retinopathy, bilateral: Secondary | ICD-10-CM | POA: Diagnosis not present

## 2021-08-29 DIAGNOSIS — I10 Essential (primary) hypertension: Secondary | ICD-10-CM

## 2021-08-29 DIAGNOSIS — H33301 Unspecified retinal break, right eye: Secondary | ICD-10-CM

## 2021-08-30 ENCOUNTER — Ambulatory Visit: Payer: 59 | Admitting: Sports Medicine

## 2021-09-05 ENCOUNTER — Telehealth: Payer: Self-pay | Admitting: Gastroenterology

## 2021-09-05 MED ORDER — DILTIAZEM GEL 2 %: CUTANEOUS | 2 refills | Status: DC

## 2021-09-05 NOTE — Telephone Encounter (Signed)
Please bring urine with app next available appointment.  Her symptoms are concerning for possible anal fissure.  Please send prescription for diltiazem gel 2%, advised patient to apply small pea-sized amount per rectum 3 times daily for 2 months.  Can use RectiCare small pea-sized amount per rectum up to 3-4 times daily as needed for rectal pain.  Further recommendations based on evaluation in office visit. ?

## 2021-09-05 NOTE — Telephone Encounter (Signed)
Spoke with the patient. She reports rectal bleeding and discomfort. She passed " a very large" stool. It required a lot of straining and then caused pain. She did not complete the bowel movement because of the pain. She admits she has not been taking the Linzess due to the difficulty she has with the timing of taking it and taking Rybelsus. She does prefer to see you but will see an APP if necessary. There are no openings on APP schedules until next week if a triage appointment is used. ?Please advise. ?

## 2021-09-05 NOTE — Telephone Encounter (Signed)
Inbound call from patient stating she is having blood in stool. Patient stated she wanted to get an appointment with Dr. Silverio Decamp and I advised that she is book up and I could offer an appointment with a PA. Patient stated she did not want to see a PA. Patient is seeking advice in what she needs to do. Please advise.  ?

## 2021-09-05 NOTE — Telephone Encounter (Signed)
Patient advised of the plan of care. She agrees to come see APP on 09/11/21 at 1:30 pm.  ?Follow up with Dr Silverio Decamp scheduled for 10/11/21 at 9:50 am. ?Explained use of diltiazem and Recticare.  ?

## 2021-09-11 ENCOUNTER — Ambulatory Visit: Payer: 59 | Admitting: Gastroenterology

## 2021-09-11 ENCOUNTER — Encounter: Payer: Self-pay | Admitting: Gastroenterology

## 2021-09-11 ENCOUNTER — Encounter (INDEPENDENT_AMBULATORY_CARE_PROVIDER_SITE_OTHER): Payer: 59 | Admitting: Ophthalmology

## 2021-09-11 VITALS — BP 132/82 | HR 87 | Ht 70.0 in | Wt 256.0 lb

## 2021-09-11 DIAGNOSIS — K581 Irritable bowel syndrome with constipation: Secondary | ICD-10-CM

## 2021-09-11 DIAGNOSIS — K625 Hemorrhage of anus and rectum: Secondary | ICD-10-CM

## 2021-09-11 DIAGNOSIS — K582 Mixed irritable bowel syndrome: Secondary | ICD-10-CM

## 2021-09-11 MED ORDER — HYDROCORTISONE ACETATE 25 MG RE SUPP: 25.0000 mg | Freq: Every evening | RECTAL | 1 refills | Status: DC

## 2021-09-11 NOTE — Patient Instructions (Signed)
We have sent the following medications to your pharmacy for you to pick up at your convenience: Hydrocortisone suppository nightly for 7 days.  Start Linzess 145 mcg daily before breakfast.  Call or send Mychart message with an update in 4 weeks.   Thank you for entrusting me with your care and for choosing [employer],  Alonza Bogus, P.A. - C.

## 2021-09-11 NOTE — Progress Notes (Signed)
09/11/2021 Maria Sharp [id number] Nov 12, 1965   HISTORY OF PRESENT ILLNESS: This is a 56 year old female who is a patient of Dr. Woodward Ku.  He is here today with complaints of rectal bleeding and discomfort.  She has IBS with alternating constipation and diarrhea, although sounds like more predominant constipation.  She has Linzess 145 mcg daily to be taking at home, but does not sound like she uses regularly.  She tells me she has very large hard stools and about a week ago she had a lot of straining and a large stool that caused some anal discomfort and rectal bleeding.  Bleeding only occurred on 1 occasion.  She called here and diltiazem gel was sent [facility] to [facility] for possible fissure.  It was also recommended that she be seen and examined, however.  Colonoscopy March 26, 2018 with removal of 2 small subcentimeter sessile tubular adenomatous polyps and internal hemorrhoids   Past Medical History:  Diagnosis Date   Allergy    Asthma    Diabetes mellitus    Fatty liver    Gastritis    Gastroparesis    study 11 14  nl abd [place]   GERD (gastroesophageal reflux disease)    Gout    High cholesterol    History of varicella    Hypertension    IBS (irritable bowel syndrome)    Past Surgical History:  Procedure Laterality Date   ANAL RECTAL MANOMETRY N/A 04/03/2015   Procedure: ANO RECTAL MANOMETRY;  Surgeon: Mauri Pole, MD;  Location: [facility];  Service: Endoscopy;  Laterality: N/A;   CHOLECYSTECTOMY     WISDOM TOOTH EXTRACTION      reports that she has never smoked. She has never used smokeless tobacco. She reports that she does not drink alcohol and does not use drugs. family history includes Arthritis in her mother and paternal grandmother; Cholelithiasis in her paternal grandfather; Dementia in her paternal grandmother; Diabetes in her father, mother, paternal grandfather, and paternal grandmother; Heart disease in her mother and paternal  grandmother; Hyperlipidemia in her maternal grandfather, maternal grandmother, mother, paternal grandfather, and paternal grandmother; Hypertension in her father, maternal grandfather, maternal grandmother, mother, paternal grandfather, and paternal grandmother; Parkinson's disease in her mother; Prostate cancer in her father; Thyroid disease in her mother. Allergies  Allergen Reactions   Ace Inhibitors Anaphylaxis   Shellfish Allergy Anaphylaxis and Other (See Comments)    Only shrimp   Januvia [Sitagliptin] Other (See Comments)    Recurrent abdominal pain with elevated pancreas enzymes   Latex Itching   Lipitor [Atorvastatin]       Outpatient Encounter Medications as of 09/11/2021  Medication Sig   albuterol (PROVENTIL HFA;VENTOLIN HFA) 108 (90 Base) MCG/ACT inhaler INHALE 2 PUFFS Q 4 TO 6 H PRN   azelastine (ASTELIN) 0.1 % nasal spray U 1 TO 2 SPRAYS IEN BID   azelastine (OPTIVAR) 0.05 % ophthalmic solution 1 drop into affected eye   blood glucose meter kit and supplies KIT Dispense based on patient and insurance preference. Use up to four times daily as directed. (FOR ICD-9 250.00, 250.01).   celecoxib (CELEBREX) 100 MG capsule Take 100 mg by mouth as needed.   Cholecalciferol (D3-1000 PO) Take 1,000 mg by mouth daily.    dicyclomine (BENTYL) 20 MG tablet Take 1 tablet (20 mg total) by mouth 3 (three) times daily before meals.   diltiazem 2 % GEL apply small pea-sized amount per rectum 3 times daily for 2 months  EPINEPHrine (EPI-PEN) 0.3 mg/0.3 mL DEVI Inject 0.3 mg into the muscle once as needed. For severe allergic reaction   FLOVENT HFA 110 MCG/ACT inhaler Inhale 2 puffs into the lungs 2 (two) times daily.   glucose blood test strip USE TO TEST 4 TIMES A DAY   Lancets (ONETOUCH DELICA PLUS [id number]) MISC USE TO TEST UP TO 4 TIMES A DAY   linaclotide (LINZESS) 145 MCG CAPS capsule Take 1 capsule (145 mcg total) by mouth daily before breakfast.   montelukast (SINGULAIR) 10 MG  tablet 1 tablet in the evening   Multiple Vitamins-Minerals (ALIVE WOMENS 50+) TABS See admin instructions.   Multiple Vitamins-Minerals (HAIR VITAMINS PO) Take by mouth. Vivastol   olmesartan (BENICAR) 40 MG tablet Take 1 tablet (40 mg total) by mouth daily.   omeprazole (PRILOSEC) 40 MG capsule Take 1 capsule (40 mg total) by mouth in the morning and at bedtime.   rosuvastatin (CRESTOR) 5 MG tablet Take 0.5 tablets (2.5 mg total) by mouth every other day. (Patient taking differently: Take 5 mg by mouth 2 (two) times a week.)   RYBELSUS 3 MG TABS Take 1 tablet by mouth every morning.   Simethicone 80 MG TABS Take 1 tablet (80 mg total) by mouth 3 (three) times daily.   spironolactone (ALDACTONE) 25 MG tablet Take 1 tablet (25 mg total) by mouth 2 (two) times daily. (Patient taking differently: Take 25 mg by mouth daily.)   vitamin B-12 (CYANOCOBALAMIN) 1000 MCG tablet Take 1,000 mcg by mouth daily.   vitamin E 400 UNIT capsule Take 400 Units by mouth daily.   Zinc 100 MG TABS 1 tablet   [DISCONTINUED] chlorthalidone (HYGROTON) 25 MG tablet Take 1 tablet (25 mg total) by mouth daily. (Patient not taking: Reported on 09/11/2021)   No facility-administered encounter medications on file as of 09/11/2021.     REVIEW OF SYSTEMS  : All other systems reviewed and negative except where noted in the History of Present Illness.   PHYSICAL EXAM: BP 132/82   Pulse 87   Ht 5' 10" (1.778 m)   Wt 256 lb (116.1 kg)   LMP 01/17/2018   SpO2 98%   BMI 36.73 kg/m  General: Well developed AA female in no acute distress Head: Normocephalic and atraumatic Eyes:  Sclerae anicteric, conjunctiva pink. Ears: Normal auditory acuity Lungs: Clear throughout to auscultation; no W/R/R. Heart: Regular rate and rhythm; no M/R/G. Abdomen: Soft, non-distended.  BS present.  Mild diffuse tenderness to palpation. Rectal: No external hemorrhoids noted.  No other abnormalities including fissure were seen.  DRE was  well-tolerated without pain but there was a lot of hard mobile stool noted within the anal canal/rectal vault.   Musculoskeletal: Symmetrical with no gross deformities  Skin: No lesions on visible extremities Extremities: No edema  Neurological: Alert oriented x 4, grossly non-focal Psychological:  Alert and cooperative. Normal mood and affect  ASSESSMENT AND PLAN: *Rectal bleeding and discomfort: I do not see any definite fissure on exam today and she was very tolerant of DRE.  Prescription for diltiazem was already sent to her pharmacy.  She says that she is going to pick that up, but I suggested she just try hydrocortisone suppositories at nighttime for the next several days instead.  Will send those to her pharmacy. *IBS with alternating constipation and diarrhea, although sounds like more prominent constipation: Has Linzess 145 mcg daily, but does not sound like she is good about taking it.  I have recommended that she  begin taking this regularly.  It was also mentioned to her previously that she should begin taking Benefiber daily as well and that will help with bulking of her stools.   CC:  Jonathon Jordan, MD

## 2021-09-12 ENCOUNTER — Encounter (INDEPENDENT_AMBULATORY_CARE_PROVIDER_SITE_OTHER): Payer: 59 | Admitting: Ophthalmology

## 2021-09-12 DIAGNOSIS — H33302 Unspecified retinal break, left eye: Secondary | ICD-10-CM

## 2021-09-13 NOTE — Progress Notes (Signed)
Reviewed and agree with documentation and assessment and plan. K. Veena Calliope Delangel , MD   

## 2021-09-24 ENCOUNTER — Ambulatory Visit: Payer: 59 | Admitting: Podiatry

## 2021-10-03 ENCOUNTER — Encounter: Payer: Self-pay | Admitting: Podiatry

## 2021-10-03 ENCOUNTER — Ambulatory Visit: Payer: 59 | Admitting: Podiatry

## 2021-10-03 DIAGNOSIS — M79609 Pain in unspecified limb: Secondary | ICD-10-CM

## 2021-10-03 DIAGNOSIS — M2141 Flat foot [pes planus] (acquired), right foot: Secondary | ICD-10-CM

## 2021-10-03 DIAGNOSIS — M2042 Other hammer toe(s) (acquired), left foot: Secondary | ICD-10-CM | POA: Diagnosis not present

## 2021-10-03 DIAGNOSIS — E1151 Type 2 diabetes mellitus with diabetic peripheral angiopathy without gangrene: Secondary | ICD-10-CM

## 2021-10-03 DIAGNOSIS — E119 Type 2 diabetes mellitus without complications: Secondary | ICD-10-CM | POA: Diagnosis not present

## 2021-10-03 DIAGNOSIS — B351 Tinea unguium: Secondary | ICD-10-CM | POA: Diagnosis not present

## 2021-10-03 DIAGNOSIS — M6780 Other specified disorders of synovium and tendon, unspecified site: Secondary | ICD-10-CM | POA: Insufficient documentation

## 2021-10-03 DIAGNOSIS — M2142 Flat foot [pes planus] (acquired), left foot: Secondary | ICD-10-CM

## 2021-10-03 DIAGNOSIS — G4726 Circadian rhythm sleep disorder, shift work type: Secondary | ICD-10-CM | POA: Insufficient documentation

## 2021-10-08 NOTE — Progress Notes (Signed)
ANNUAL DIABETIC FOOT EXAM  Subjective: Maria Sharp presents today preventative diabetic foot care and painful elongated mycotic toenails 1-5 bilaterally which are tender when wearing enclosed shoe gear. Pain is relieved with periodic professional debridement..  She and her husband are former patient of Dr. Leeanne Rio.   Last known  HgA1c was 6.3% .Patient did not check blood glucose this morning.  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  Risk factors: diabetes, PAD, HTN, hyperlipidemia.  Jonathon Jordan, MD is patient's PCP. Last visit was October 01, 2021.  Past Medical History:  Diagnosis Date   Allergy    Asthma    Diabetes mellitus    Fatty liver    Gastritis    Gastroparesis    study 11 14  nl abd [place]   GERD (gastroesophageal reflux disease)    Gout    High cholesterol    History of varicella    Hypertension    IBS (irritable bowel syndrome)    Patient Active Problem List   Diagnosis Date Noted   Circadian rhythm sleep disorder, shift work type 10/03/2021   Cyst of tendon sheath 10/03/2021   Hypercalcemia 10/03/2021   Allergic rhinitis 02/01/2021   Allergic rhinitis due to animal (cat) (dog) hair and dander 02/01/2021   Allergic rhinitis due to pollen 02/01/2021   Chronic allergic conjunctivitis 02/01/2021   Mild persistent asthma, uncomplicated 16/01/9603   Seafood allergy 02/01/2021   Exposure to COVID-19 virus 08/26/2018   Migraines 09/02/2017   DJD (degenerative joint disease) 09/02/2017   Vertigo 12/05/2016   Bilateral temporomandibular joint pain 12/05/2016   Constipation    Asthma, chronic 09/28/2014   Gastroparesis    Varicose veins 09/14/2013   Neck pain on left side 06/14/2013   Current non-adherence to medical treatment 06/14/2013   Vitamin D deficiency 10/05/2012   Leg cramps 10/05/2012   Family history of diabetes mellitus 10/05/2012   Type 2 diabetes mellitus, uncontrolled 05/28/2012   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    ABDOMINAL PAIN, UNSPECIFIED SITE 05/18/2010   CHEST PAIN, INTERMITTENT 05/12/2009   Obesity 04/05/2009   CARDIOMEGALY 04/05/2009   PALPITATIONS 04/05/2009   Type 2 diabetes mellitus with peripheral circulatory disorder ([place]) 03/15/2009   BACK PAIN 03/09/2008   Hyperlipidemia LDL goal <100 03/03/2008   Essential hypertension 03/03/2008   GERD 03/03/2008   Irritable bowel syndrome with both constipation and diarrhea 03/03/2008   Rectal bleeding 03/03/2008   Past Surgical History:  Procedure Laterality Date   ANAL RECTAL MANOMETRY N/A 04/03/2015   Procedure: ANO RECTAL MANOMETRY;  Surgeon: Mauri Pole, MD;  Location: [facility];  Service: Endoscopy;  Laterality: N/A;   CHOLECYSTECTOMY     WISDOM TOOTH EXTRACTION     Current Outpatient Medications on File Prior to Visit  Medication Sig Dispense Refill   albuterol (PROVENTIL HFA;VENTOLIN HFA) 108 (90 Base) MCG/ACT inhaler INHALE 2 PUFFS Q 4 TO 6 H PRN  0   azelastine (ASTELIN) 0.1 % nasal spray U 1 TO 2 SPRAYS IEN BID  6   azelastine (OPTIVAR) 0.05 % ophthalmic solution 1 drop into affected eye     blood glucose meter kit and supplies KIT Dispense based on patient and insurance preference. Use up to four times daily as directed. (FOR ICD-9 250.00, 250.01). 1 each 0   celecoxib (CELEBREX) 100 MG capsule Take 100 mg by mouth as needed.     chlorthalidone (HYGROTON) 25 MG tablet Take 25 mg by mouth daily.     Cholecalciferol (  D3-1000 PO) Take 1,000 mg by mouth daily.      dicyclomine (BENTYL) 20 MG tablet Take 1 tablet (20 mg total) by mouth 3 (three) times daily before meals. 90 tablet 2   diltiazem 2 % GEL apply small pea-sized amount per rectum 3 times daily for 2 months 30 g 2   EPINEPHrine (EPI-PEN) 0.3 mg/0.3 mL DEVI Inject 0.3 mg into the muscle once as needed. For severe allergic reaction     FLOVENT HFA 110 MCG/ACT inhaler Inhale 2 puffs into the lungs 2 (two) times daily.     furosemide (LASIX) 20 MG tablet Take 20 mg by  mouth daily.     glucose blood test strip USE TO TEST 4 TIMES A DAY     hydrocortisone (ANUSOL-HC) 25 MG suppository Place 1 suppository (25 mg total) rectally at bedtime. 7 suppository 1   Lancets (ONETOUCH DELICA PLUS [id number]) MISC USE TO TEST UP TO 4 TIMES A DAY     linaclotide (LINZESS) 145 MCG CAPS capsule Take 1 capsule (145 mcg total) by mouth daily before breakfast. 90 capsule 3   montelukast (SINGULAIR) 10 MG tablet 1 tablet in the evening     Multiple Vitamins-Minerals (ALIVE WOMENS 50+) TABS See admin instructions.     Multiple Vitamins-Minerals (HAIR VITAMINS PO) Take by mouth. Vivastol     olmesartan (BENICAR) 40 MG tablet Take 1 tablet (40 mg total) by mouth daily. 90 tablet 3   omeprazole (PRILOSEC) 40 MG capsule Take 1 capsule (40 mg total) by mouth in the morning and at bedtime. 60 capsule 3   prednisoLONE acetate (PRED FORTE) 1 % ophthalmic suspension Place 1 drop into the right eye 4 (four) times daily.     rosuvastatin (CRESTOR) 5 MG tablet Take 0.5 tablets (2.5 mg total) by mouth every other day. (Patient taking differently: Take 5 mg by mouth 2 (two) times a week.) 45 tablet 1   RYBELSUS 3 MG TABS Take 1 tablet by mouth every morning.     Simethicone 80 MG TABS Take 1 tablet (80 mg total) by mouth 3 (three) times daily. 90 tablet 3   spironolactone (ALDACTONE) 25 MG tablet Take 1 tablet (25 mg total) by mouth 2 (two) times daily. (Patient taking differently: Take 25 mg by mouth daily.) 180 tablet 1   vitamin B-12 (CYANOCOBALAMIN) 1000 MCG tablet Take 1,000 mcg by mouth daily.     vitamin E 400 UNIT capsule Take 400 Units by mouth daily.     Zinc 100 MG TABS 1 tablet     No current facility-administered medications on file prior to visit.    Allergies  Allergen Reactions   Ace Inhibitors Anaphylaxis   Shellfish Allergy Anaphylaxis and Other (See Comments)    Only shrimp   Januvia [Sitagliptin] Other (See Comments)    Recurrent abdominal pain with elevated pancreas  enzymes   Latex Itching   Lipitor [Atorvastatin]    Social History   Occupational History   Occupation: Post office  Tobacco Use   Smoking status: Never   Smokeless tobacco: Never  Vaping Use   Vaping Use: Never used  Substance and Sexual Activity   Alcohol use: No   Drug use: No   Sexual activity: Not on file   Family History  Problem Relation Age of Onset   Diabetes Mother    Heart disease Mother    Arthritis Mother    Hyperlipidemia Mother    Hypertension Mother    Parkinson's disease Mother  Thyroid disease Mother    Prostate cancer Father    Diabetes Father    Hypertension Father    Hyperlipidemia Maternal Grandmother    Hypertension Maternal Grandmother    Hyperlipidemia Maternal Grandfather    Hypertension Maternal Grandfather    Diabetes Paternal Grandmother    Arthritis Paternal Grandmother    Hyperlipidemia Paternal Grandmother    Heart disease Paternal Grandmother    Dementia Paternal Grandmother    Hypertension Paternal Grandmother    Hyperlipidemia Paternal Grandfather    Hypertension Paternal Grandfather    Diabetes Paternal Grandfather    Cholelithiasis Paternal Grandfather    Colon cancer Neg Hx    Esophageal cancer Neg Hx    Stomach cancer Neg Hx    Rectal cancer Neg Hx    Immunization History  Administered Date(s) Administered   Influenza, High Dose Seasonal PF 03/16/2018, 01/22/2021   Influenza,inj,Quad PF,6+ Mos 02/08/2013, 02/07/2014, 02/17/2017, 03/02/2018   Influenza-Unspecified 02/07/2014, 01/08/2021   PFIZER(Purple Top)SARS-COV-2 Vaccination 06/28/2019, 07/26/2019, 11/29/2019, 02/24/2020   Pneumococcal Conjugate-13 02/08/2013   Pneumococcal Polysaccharide-23 12/20/2013, 02/14/2016, 01/21/2017, 03/16/2018, 01/22/2021   Pneumococcal-Unspecified 04/22/2004   Tdap 01/01/2012, 06/14/2013     Review of Systems: Negative except as noted in the HPI.   Objective: There were no vitals filed for this visit.  Maria Sharp is a  pleasant 56 y.o. female in NAD. AAO X 3.  Vascular Examination: CFT <3 seconds b/l LE. Palpable DP pulse(s) b/l LE. Faintly palpable PT pulse(s) b/l LE. Pedal hair sparse. No pain with calf compression b/l. Lower extremity skin temperature gradient within normal limits. Trace edema noted bilateral ankles. Varicosities present b/l.  Dermatological Examination: Pedal integument with normal turgor, texture and tone b/l LE. No open wounds b/l. No interdigital macerations b/l. Toenails 1-5 b/l elongated, thickened, discolored with subungual debris. +Tenderness with dorsal palpation of nailplates. No hyperkeratotic or porokeratotic lesions present.  Neurological Examination: Protective sensation intact 5/5 intact bilaterally with 10g monofilament b/l. Vibratory sensation intact b/l.  Musculoskeletal Examination: Normal muscle strength 5/5 to all lower extremity muscle groups bilaterally. No pain, crepitus or joint limitation noted with ROM bilateral LE. Hammertoe(s) noted to the L 5th toe. Pes planus deformity noted bilateral LE.Marland Kitchen No pain, crepitus or joint limitation noted with ROM b/l LE.  Patient ambulates independently without assistive aids.  Footwear Assessment: Does the patient wear appropriate shoes? Yes.  Assessment: 1. Pain due to onychomycosis of nail   2. Pes planus of both feet   3. Hammertoe of left foot   4. Type II diabetes mellitus with peripheral circulatory disorder (HCC)   5. Encounter for diabetic foot exam ([place])     ADA Risk Categorization: Low Risk :  Patient has all of the following: Intact protective sensation No prior foot ulcer  No severe deformity Pedal pulses present  Plan: -Patient was evaluated and treated. All patient's and/or POA's questions/concerns answered on today's visit. -Diabetic foot examination performed today. -Continue foot and shoe inspections daily. Monitor blood glucose per PCP/Endocrinologist's recommendations. -Toenails 1-5 b/l were  debrided in length and girth with sterile nail nippers and dremel without iatrogenic bleeding.  -Patient/POA to call should there be question/concern in the interim. Return in about 10 weeks (around 12/12/2021).  Marzetta Board, DPM

## 2021-10-11 ENCOUNTER — Ambulatory Visit: Payer: 59 | Admitting: Gastroenterology

## 2021-10-13 ENCOUNTER — Other Ambulatory Visit: Payer: Self-pay | Admitting: Gastroenterology

## 2021-10-17 ENCOUNTER — Ambulatory Visit: Payer: 59 | Admitting: Orthopedic Surgery

## 2021-12-17 ENCOUNTER — Ambulatory Visit: Payer: 59 | Admitting: Podiatry

## 2021-12-17 ENCOUNTER — Ambulatory Visit: Payer: 59 | Admitting: Physician Assistant

## 2021-12-17 ENCOUNTER — Encounter: Payer: Self-pay | Admitting: Physician Assistant

## 2021-12-17 VITALS — BP 146/84 | HR 73 | Ht 70.0 in | Wt 257.1 lb

## 2021-12-17 DIAGNOSIS — K219 Gastro-esophageal reflux disease without esophagitis: Secondary | ICD-10-CM | POA: Diagnosis not present

## 2021-12-17 DIAGNOSIS — K581 Irritable bowel syndrome with constipation: Secondary | ICD-10-CM

## 2021-12-17 DIAGNOSIS — K3184 Gastroparesis: Secondary | ICD-10-CM | POA: Diagnosis not present

## 2021-12-17 NOTE — Patient Instructions (Addendum)
If you are age 56 or younger, your body mass index should be between 19-25. Your Body mass index is 36.89 kg/m. If this is out of the aformentioned range listed, please consider follow up with your Primary Care Provider.  ________________________________________________________  The [facility] providers would like to encourage you to use [facility] to communicate with providers for non-urgent requests or questions.  Due to long hold times on the telephone, sending your provider a message by [facility] may be a faster and more efficient way to get a response.  Please allow 48 business hours for a response.  Please remember that this is for non-urgent requests.  _______________________________________________________  Dennis Bast are scheduled for a follow up with Dr Silverio Decamp on 03-05-22 at 10:40am.  Linzess  Take at least 30 minutes before the first meal of the day on an empty stomach You can have a loose stool if you eat a high-fat breakfast. Give it at least 4 days, may have more bowel movements during that time.  If you continue to have severe diarrhea try every other day, if you get AB pain with it stop and call our office.  After you are out we can send in a prescription if you did well, there is a prescription card  Do trial of motegrity, can take with food.  If this does not work can consider trulance instead of linzess.   - Drink at least 64-80 ounces of water/liquid per day. - Establish a time to try to move your bowels every day.  For many people, this is after a cup of coffee or after a meal such as breakfast. - Sit all of the way back on the toilet keeping your back fairly straight and while sitting up, try to rest the tops of your forearms on your upper thighs.   - Raising your feet with a step stool/squatty potty can be helpful to improve the angle that allows your stool to pass through the rectum. - Relax the rectum feeling it bulge toward the toilet water.  If you feel your rectum raising  toward your body, you are contracting rather than relaxing. - Breathe in and slowly exhale. "Belly breath" by expanding your belly towards your belly button. Keep belly expanded as you gently direct pressure down and back to the anus.  A low pitched GRRR sound can assist with increasing intra-abdominal pressure.  - Repeat 3-4 times. If unsuccessful, contract the pelvic floor to restore normal tone and get off the toilet.  Avoid excessive straining. - To reduce excessive wiping by teaching your anus to normally contract, place hands on outer aspect of knees and resist knee movement outward.  Hold 5-10 second then place hands just inside of knees and resist inward movement of knees.  Hold 5 seconds.  Repeat a few times each way.  Go to the ER if unable to pass gas, severe AB pain, unable to hold down food, any shortness of breath of chest pain.  Thank you for entrusting me with your care and choosing [facility].  Vicie Mutters, PA-C

## 2021-12-17 NOTE — Progress Notes (Signed)
12/17/2021 Maria Sharp [id number] [birth year]  Referring provider: Jonathon Jordan, MD Primary GI doctor: Dr. Silverio Decamp  ASSESSMENT AND PLAN:   Assessment: 56 y.o. female here for assessment of the following: 1. Irritable bowel syndrome with constipation   2. Gastroesophageal reflux disease without esophagitis   3. Gastroparesis    Normal colonosocpy 2019 No more rectal bleeding Known history of IBS-C  Plan:  Can try the 290 mcg linzess, can try to take before lunch if that helps her take daily and remember.  Given trial of motegrity as well if the linzess is not helping, especially history with gastroparesis Could do trial of trulance if that does not help. - Increase fiber/ water intake, decrease caffeine, increase activity level.  Patient is upset she was unable to see Dr. Silverio Decamp today, will schedule soonest possible with Dr. Silverio Decamp.    History of Present Illness:  56 y.o. female  with a past medical history of diabetes, gastroparesis, gastritis, GERD, IBS mixed, HTN and others listed below, returns to clinic today for evaluation of follow up for rectal bleeding. Colonoscopy March 26, 2018 with removal of 2 small subcentimeter sessile tubular adenomatous polyps and internal hemorrhoids  09/11/2021 IBS alternating constipation/diarrhea predominant constiptaion Sent in diltiazem/suppositories, encouraged to take linzess 145 mcg daily and add on fiber.  States she is not having anymore bleeding with BM's. No rectal pain.  She continues to have very hard stools, large volume. Has lower AB pain with pushing.  She is on rybelsus for DM, has to take in the morning on empty stomach so has a hard time taking the linzess in the morning before food.   She  reports that she has never smoked. She has never used smokeless tobacco. She reports that she does not drink alcohol and does not use drugs. Her family history includes Arthritis in her mother and paternal grandmother;  Cholelithiasis in her paternal grandfather; Dementia in her paternal grandmother; Diabetes in her father, mother, paternal grandfather, and paternal grandmother; Heart disease in her mother and paternal grandmother; Hyperlipidemia in her maternal grandfather, maternal grandmother, mother, paternal grandfather, and paternal grandmother; Hypertension in her father, maternal grandfather, maternal grandmother, mother, paternal grandfather, and paternal grandmother; Parkinson's disease in her mother; Prostate cancer in her father; Thyroid disease in her mother.   Current Medications:   Current Outpatient Medications (Endocrine & Metabolic):    RYBELSUS 3 MG TABS, Take 1 tablet by mouth every morning.  Current Outpatient Medications (Cardiovascular):    chlorthalidone (HYGROTON) 25 MG tablet, Take 25 mg by mouth daily.   diltiazem 2 % GEL*, apply small pea-sized amount per rectum 3 times daily for 2 months   EPINEPHrine (EPI-PEN) 0.3 mg/0.3 mL DEVI, Inject 0.3 mg into the muscle once as needed. For severe allergic reaction   furosemide (LASIX) 20 MG tablet, Take 20 mg by mouth daily.   olmesartan (BENICAR) 40 MG tablet, Take 1 tablet (40 mg total) by mouth daily.   rosuvastatin (CRESTOR) 5 MG tablet, Take 0.5 tablets (2.5 mg total) by mouth every other day. (Patient taking differently: Take 5 mg by mouth 2 (two) times a week.)   spironolactone (ALDACTONE) 25 MG tablet, Take 1 tablet (25 mg total) by mouth 2 (two) times daily. (Patient taking differently: Take 25 mg by mouth daily.)  Current Outpatient Medications (Respiratory):    albuterol (PROVENTIL HFA;VENTOLIN HFA) 108 (90 Base) MCG/ACT inhaler, INHALE 2 PUFFS Q 4 TO 6 H PRN   azelastine (ASTELIN) 0.1 % nasal spray,  U 1 TO 2 SPRAYS IEN BID   FLOVENT HFA 110 MCG/ACT inhaler, Inhale 2 puffs into the lungs 2 (two) times daily.   montelukast (SINGULAIR) 10 MG tablet, 1 tablet in the evening  Current Outpatient Medications (Analgesics):    celecoxib  (CELEBREX) 100 MG capsule, Take 100 mg by mouth as needed.  Current Outpatient Medications (Hematological):    vitamin B-12 (CYANOCOBALAMIN) 1000 MCG tablet, Take 1,000 mcg by mouth daily.  Current Outpatient Medications (Other):    azelastine (OPTIVAR) 0.05 % ophthalmic solution, 1 drop into affected eye   blood glucose meter kit and supplies KIT, Dispense based on patient and insurance preference. Use up to four times daily as directed. (FOR ICD-9 250.00, 250.01).   Cholecalciferol (D3-1000 PO), Take 1,000 mg by mouth daily.    dicyclomine (BENTYL) 20 MG tablet, Take 1 tablet (20 mg total) by mouth 3 (three) times daily before meals.   diltiazem 2 % GEL*, apply small pea-sized amount per rectum 3 times daily for 2 months   glucose blood test strip, USE TO TEST 4 TIMES A DAY   hydrocortisone (ANUSOL-HC) 25 MG suppository, Place 1 suppository (25 mg total) rectally at bedtime.   Lancets (ONETOUCH DELICA PLUS [id number]) MISC, USE TO TEST UP TO 4 TIMES A DAY   linaclotide (LINZESS) 145 MCG CAPS capsule, Take 1 capsule (145 mcg total) by mouth daily before breakfast.   Multiple Vitamins-Minerals (ALIVE WOMENS 50+) TABS, See admin instructions.   Multiple Vitamins-Minerals (HAIR VITAMINS PO), Take by mouth. Vivastol   omeprazole (PRILOSEC) 40 MG capsule, TAKE 1 CAPSULE (40 MG TOTAL) BY MOUTH IN THE MORNING AND AT BEDTIME.   prednisoLONE acetate (PRED FORTE) 1 % ophthalmic suspension, Place 1 drop into the right eye 4 (four) times daily.   Simethicone 80 MG TABS, Take 1 tablet (80 mg total) by mouth 3 (three) times daily.   vitamin E 400 UNIT capsule, Take 400 Units by mouth daily.   Zinc 100 MG TABS, 1 tablet * These medications belong to multiple therapeutic classes and are listed under each applicable group.  Surgical History:  She  has a past surgical history that includes Cholecystectomy; Wisdom tooth extraction; Anal Rectal manometry (N/A, 04/03/2015); and Refractive surgery  (Right).  Current Medications, Allergies, Past Medical History, Past Surgical History, Family History and Social History were reviewed in [employer] record.  Physical Exam: BP (!) 146/84   Pulse 73   Ht _0  (1.778 m)   Wt 257 lb 2 oz (116.6 kg)   LMP 01/13/2018   BMI 36.89 kg/m  General:   Pleasant, well developed female in no acute distress Abdomen:  Soft, Obese AB, Active bowel sounds. No tenderness . , No organomegaly appreciated. Rectal: Not evaluated Psych:  Cooperative. Normal mood and affect.   Vladimir Crofts, PA-C 12/17/21

## 2021-12-19 ENCOUNTER — Ambulatory Visit: Payer: 59 | Admitting: Orthopedic Surgery

## 2022-01-11 ENCOUNTER — Ambulatory Visit (INDEPENDENT_AMBULATORY_CARE_PROVIDER_SITE_OTHER): Payer: 59 | Admitting: Podiatry

## 2022-01-11 ENCOUNTER — Encounter: Payer: Self-pay | Admitting: Podiatry

## 2022-01-11 DIAGNOSIS — B351 Tinea unguium: Secondary | ICD-10-CM

## 2022-01-11 DIAGNOSIS — M79609 Pain in unspecified limb: Secondary | ICD-10-CM | POA: Diagnosis not present

## 2022-01-11 DIAGNOSIS — E1151 Type 2 diabetes mellitus with diabetic peripheral angiopathy without gangrene: Secondary | ICD-10-CM

## 2022-01-14 ENCOUNTER — Encounter (INDEPENDENT_AMBULATORY_CARE_PROVIDER_SITE_OTHER): Payer: 59 | Admitting: Ophthalmology

## 2022-01-14 DIAGNOSIS — H43813 Vitreous degeneration, bilateral: Secondary | ICD-10-CM

## 2022-01-14 DIAGNOSIS — I10 Essential (primary) hypertension: Secondary | ICD-10-CM | POA: Diagnosis not present

## 2022-01-14 DIAGNOSIS — H35033 Hypertensive retinopathy, bilateral: Secondary | ICD-10-CM

## 2022-01-14 DIAGNOSIS — H33301 Unspecified retinal break, right eye: Secondary | ICD-10-CM

## 2022-01-18 NOTE — Progress Notes (Signed)
  Subjective:  Patient ID: Maria Sharp, female    DOB: 29-Jan-1966,  MRN: [id number]  Maria Sharp presents to clinic today for preventative diabetic foot care and painful thick toenails that are difficult to trim. Pain interferes with ambulation. Aggravating factors include wearing enclosed shoe gear. Pain is relieved with periodic professional debridement.  Last known  HgA1c was 6.8%. Patient states she has not checked her blood glucose all week.  She would like to discuss some treatments for fungal toenails.  PCP is Jonathon Jordan, MD , and last visit was March, 2023.  Allergies  Allergen Reactions   Ace Inhibitors Anaphylaxis    Other reaction(s): Not available   Shellfish Allergy Anaphylaxis and Other (See Comments)    Only shrimp   Januvia [Sitagliptin] Other (See Comments)    Recurrent abdominal pain with elevated pancreas enzymes   Latex Itching   Lipitor [Atorvastatin]    Shellfish-Derived Products     Other reaction(s): Not available    Review of Systems: Negative except as noted in the HPI.  Objective: No changes noted in today's physical examination.  Maria Sharp is a pleasant 56 y.o. female in NAD. AAO x 3.  Vascular Examination: CFT <3 seconds b/l LE. Palpable DP pulse(s) b/l LE. Faintly palpable PT pulse(s) b/l LE. Pedal hair sparse. No pain with calf compression b/l. Lower extremity skin temperature gradient within normal limits. Trace edema noted bilateral ankles. Varicosities present b/l.  Dermatological Examination: Pedal integument with normal turgor, texture and tone b/l LE. No open wounds b/l. No interdigital macerations b/l. Toenails 1-5 b/l elongated, thickened, discolored with subungual debris. +Tenderness with dorsal palpation of nailplates. No hyperkeratotic or porokeratotic lesions present.  Neurological Examination: Protective sensation intact 5/5 intact bilaterally with 10g monofilament b/l. Vibratory sensation intact  b/l.  Musculoskeletal Examination: Normal muscle strength 5/5 to all lower extremity muscle groups bilaterally. No pain, crepitus or joint limitation noted with ROM bilateral LE. Hammertoe(s) noted to the L 5th toe. Pes planus deformity noted bilateral LE.Marland Kitchen No pain, crepitus or joint limitation noted with ROM b/l LE.  Patient ambulates independently without assistive aids.  Assessment/Plan: 1. Pain due to onychomycosis of nail   2. Type II diabetes mellitus with peripheral circulatory disorder [facility])   -Consent given for treatment as described below: -Examined patient. -Discussed treatment options for onychomycosis. Patient opted for topical OTC therapy. Patient is to apply 1 drop of tea tree oil to affected toenail(s) once daily. -Mycotic toenails 1-5 bilaterally were debrided in length and girth with sterile nail nippers and dremel without incident. -Patient/POA to call should there be question/concern in the interim.   Return in about 3 months (around 04/12/2022).  Marzetta Board, DPM

## 2022-02-11 ENCOUNTER — Ambulatory Visit: Payer: 59 | Admitting: Orthopedic Surgery

## 2022-03-05 ENCOUNTER — Encounter: Payer: Self-pay | Admitting: Gastroenterology

## 2022-03-05 ENCOUNTER — Ambulatory Visit: Payer: 59 | Admitting: Gastroenterology

## 2022-03-05 VITALS — BP 138/82 | HR 86 | Wt 265.8 lb

## 2022-03-05 DIAGNOSIS — K3184 Gastroparesis: Secondary | ICD-10-CM

## 2022-03-05 DIAGNOSIS — R110 Nausea: Secondary | ICD-10-CM

## 2022-03-05 DIAGNOSIS — K581 Irritable bowel syndrome with constipation: Secondary | ICD-10-CM

## 2022-03-05 DIAGNOSIS — K219 Gastro-esophageal reflux disease without esophagitis: Secondary | ICD-10-CM

## 2022-03-05 MED ORDER — LINACLOTIDE 290 MCG PO CAPS: 290.0000 ug | ORAL_CAPSULE | Freq: Every day | ORAL | 2 refills | Status: DC

## 2022-03-05 MED ORDER — OMEPRAZOLE 40 MG PO CPDR: 40.0000 mg | DELAYED_RELEASE_CAPSULE | Freq: Two times a day (BID) | ORAL | 1 refills | Status: DC

## 2022-03-05 NOTE — Patient Instructions (Addendum)
We have sent the following medications to your pharmacy for you to pick up at your convenience: Linzess , Prilosec   Follow up in 3 month   _______________________________________________________  If you are age 56 or older, your body mass index should be between 23-30. Your Body mass index is 38.14 kg/m. If this is out of the aforementioned range listed, please consider follow up with your Primary Care Provider.  If you are age 81 or younger, your body mass index should be between 19-25. Your Body mass index is 38.14 kg/m. If this is out of the aformentioned range listed, please consider follow up with your Primary Care Provider.   ________________________________________________________  The [place] [facility] providers would like to encourage you to use [facility] to communicate with providers for non-urgent requests or questions.  Due to long hold times on the telephone, sending your provider a message by [facility] may be a faster and more efficient way to get a response.  Please allow 48 business hours for a response.  Please remember that this is for non-urgent requests.  _______________________________________________________  Thank you for choosing me and [facility].

## 2022-03-05 NOTE — Progress Notes (Signed)
Maria Sharp    [id number]    1965-11-27  Primary Care Physician:Wolters, Ivin Booty, MD  Referring Physician: Jonathon Jordan, MD [place],  [place] [id number]   Chief complaint:  GERD  HPI:  56 yr very pleasant F follow-up visit for chronic irritable bowel syndrome with alternating constipation and diarrhea.   She continues to have irregular bowel habits with alternating diarrhea and constipation, feels IBS symptoms gotten worse after she started new medication Rybelsus but have seem to have settled in the past few weeks.  She is using Linzess and dicyclomine as needed She has abdominal bloating and excessive gas.  She cut down on cheese and milk. No dysphagia, vomiting, weight loss or decreased appetite    Colonoscopy March 26, 2018 with removal of 2 small subcentimeter sessile tubular adenomatous polyps and internal hemorrhoids     Outpatient Encounter Medications as of 03/05/2022  Medication Sig   amLODipine-olmesartan (AZOR) 5-40 MG tablet Take 1 tablet by mouth daily.   albuterol (PROVENTIL HFA;VENTOLIN HFA) 108 (90 Base) MCG/ACT inhaler INHALE 2 PUFFS Q 4 TO 6 H PRN   azelastine (ASTELIN) 0.1 % nasal spray U 1 TO 2 SPRAYS IEN BID   azelastine (OPTIVAR) 0.05 % ophthalmic solution 1 drop into affected eye   blood glucose meter kit and supplies KIT Dispense based on patient and insurance preference. Use up to four times daily as directed. (FOR ICD-9 250.00, 250.01).   celecoxib (CELEBREX) 100 MG capsule Take 100 mg by mouth as needed.   chlorthalidone (HYGROTON) 25 MG tablet Take 25 mg by mouth daily. (Patient not taking: Reported on 03/05/2022)   Cholecalciferol (D3-1000 PO) Take 1,000 mg by mouth daily.    dicyclomine (BENTYL) 20 MG tablet Take 1 tablet (20 mg total) by mouth 3 (three) times daily before meals.   diltiazem 2 % GEL apply small pea-sized amount per rectum 3 times daily for 2 months   EPINEPHrine (EPI-PEN) 0.3  mg/0.3 mL DEVI Inject 0.3 mg into the muscle once as needed. For severe allergic reaction   FLOVENT HFA 110 MCG/ACT inhaler Inhale 2 puffs into the lungs 2 (two) times daily.   furosemide (LASIX) 20 MG tablet Take 20 mg by mouth daily.   glucose blood test strip USE TO TEST 4 TIMES A DAY   hydrocortisone (ANUSOL-HC) 25 MG suppository Place 1 suppository (25 mg total) rectally at bedtime.   Lancets (ONETOUCH DELICA PLUS [id number]) MISC USE TO TEST UP TO 4 TIMES A DAY   linaclotide (LINZESS) 145 MCG CAPS capsule Take 1 capsule (145 mcg total) by mouth daily before breakfast.   montelukast (SINGULAIR) 10 MG tablet 1 tablet in the evening   Multiple Vitamins-Minerals (ALIVE WOMENS 50+) TABS See admin instructions.   Multiple Vitamins-Minerals (HAIR VITAMINS PO) Take by mouth. Vivastol   olmesartan (BENICAR) 40 MG tablet Take 1 tablet (40 mg total) by mouth daily. (Patient not taking: Reported on 03/05/2022)   omeprazole (PRILOSEC) 40 MG capsule TAKE 1 CAPSULE (40 MG TOTAL) BY MOUTH IN THE MORNING AND AT BEDTIME.   prednisoLONE acetate (PRED FORTE) 1 % ophthalmic suspension Place 1 drop into the right eye 4 (four) times daily.   rosuvastatin (CRESTOR) 5 MG tablet Take 0.5 tablets (2.5 mg total) by mouth every other day. (Patient taking differently: Take 5 mg by mouth 2 (two) times a week.)   RYBELSUS 3 MG TABS Take 1 tablet by mouth every  morning.   Simethicone 80 MG TABS Take 1 tablet (80 mg total) by mouth 3 (three) times daily.   spironolactone (ALDACTONE) 25 MG tablet Take 1 tablet (25 mg total) by mouth 2 (two) times daily. (Patient taking differently: Take 25 mg by mouth daily.)   vitamin B-12 (CYANOCOBALAMIN) 1000 MCG tablet Take 1,000 mcg by mouth daily.   vitamin E 400 UNIT capsule Take 400 Units by mouth daily.   Zinc 100 MG TABS 1 tablet   No facility-administered encounter medications on file as of 03/05/2022.    Allergies as of 03/05/2022 - Review Complete 03/05/2022  Allergen  Reaction Noted   Ace inhibitors Anaphylaxis 03/03/2008   Shellfish allergy Anaphylaxis and Other (See Comments) 05/25/2011   Januvia [sitagliptin] Other (See Comments) 10/05/2012   Latex Itching    Lipitor [atorvastatin]  02/10/2013   Shellfish-derived products  01/11/2022    Past Medical History:  Diagnosis Date   Allergy    Asthma    Diabetes mellitus    Fatty liver    Gastritis    Gastroparesis    study 11 14  nl abd [place]   GERD (gastroesophageal reflux disease)    Gout    High cholesterol    History of varicella    Hypertension    IBS (irritable bowel syndrome)     Past Surgical History:  Procedure Laterality Date   ANAL RECTAL MANOMETRY N/A 04/03/2015   Procedure: ANO RECTAL MANOMETRY;  Surgeon: Mauri Pole, MD;  Location: [facility];  Service: Endoscopy;  Laterality: N/A;   CHOLECYSTECTOMY     EYE SURGERY     REFRACTIVE SURGERY Right    WISDOM TOOTH EXTRACTION      Family History  Problem Relation Age of Onset   Diabetes Mother    Heart disease Mother    Arthritis Mother    Hyperlipidemia Mother    Hypertension Mother    Parkinson's disease Mother    Thyroid disease Mother    Prostate cancer Father    Diabetes Father    Hypertension Father    Hyperlipidemia Maternal Grandmother    Hypertension Maternal Grandmother    Hyperlipidemia Maternal Grandfather    Hypertension Maternal Grandfather    Diabetes Paternal Grandmother    Arthritis Paternal Grandmother    Hyperlipidemia Paternal Grandmother    Heart disease Paternal Grandmother    Dementia Paternal Grandmother    Hypertension Paternal Grandmother    Hyperlipidemia Paternal Grandfather    Hypertension Paternal Grandfather    Diabetes Paternal Grandfather    Cholelithiasis Paternal Grandfather    Colon cancer Neg Hx    Esophageal cancer Neg Hx    Stomach cancer Neg Hx    Rectal cancer Neg Hx     Social History   Socioeconomic History   Marital status: Married    Spouse name: Not on  file   Number of children: 0   Years of education: Not on file   Highest education level: Not on file  Occupational History   Occupation: Post office  Tobacco Use   Smoking status: Never   Smokeless tobacco: Never  Vaping Use   Vaping Use: Never used  Substance and Sexual Activity   Alcohol use: No   Drug use: No   Sexual activity: Not on file  Other Topics Concern   Not on file  Social History Narrative   Usually # of hours of sleep per night: 6   3 of people living at your residence? 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   Irregular sleep 7:30 to 11 or 12 and then 4 to 6:30 before she goes to work   Has attended college.   Originally from [place]   Abnormal pap remote last 2012    Social Determinants of Health   Financial Resource Strain: Not on file  Food Insecurity: Not on file  Transportation Needs: Not on file  Physical Activity: Not on file  Stress: Not on file  Social Connections: Not on file  Intimate Partner Violence: Not on file      Review of systems: All other review of systems negative except as mentioned in the HPI.   Physical Exam: Vitals:   03/05/22 1040  BP: 138/82  Pulse: 86   Body mass index is 38.14 kg/m. Gen:      No acute distress HEENT:  sclera anicteric Abd:      soft, non-tender; no palpable masses, no distension Ext:    No edema Neuro: alert and oriented x 3 Psych: normal mood and affect  Data Reviewed:  Reviewed labs, radiology imaging, old records and pertinent past GI work up   Assessment and Plan/Recommendations:  56 year old very pleasant female with chronic irritable bowel syndrome with alternating constipation and diarrhea Benefiber 1 tablespoon 2-3 times daily with meals Increase Linzess to 290 mcg daily Increase dietary fiber and fluid intake     Use dicyclomine 20 mg twice daily as  needed for abdominal cramping and bloating  2 diminutive adenomatous colon polyps removed in December 2019, due for surveillance in December 2026   GERD: Continue omeprazole 40 mg twice daily and  antireflux measures   Return in 3 months or sooner if needed  The patient was provided an opportunity to ask questions and all were answered. The patient agreed with the plan and demonstrated an understanding of the instructions.  Damaris Hippo , MD    CC: Jonathon Jordan, MD

## 2022-03-18 ENCOUNTER — Encounter: Payer: Self-pay | Admitting: Gastroenterology

## 2022-03-18 ENCOUNTER — Encounter: Payer: Self-pay | Admitting: Orthopedic Surgery

## 2022-03-18 ENCOUNTER — Ambulatory Visit (INDEPENDENT_AMBULATORY_CARE_PROVIDER_SITE_OTHER): Payer: 59

## 2022-03-18 ENCOUNTER — Ambulatory Visit: Payer: 59 | Admitting: Orthopedic Surgery

## 2022-03-18 DIAGNOSIS — M25551 Pain in right hip: Secondary | ICD-10-CM

## 2022-03-18 DIAGNOSIS — M2550 Pain in unspecified joint: Secondary | ICD-10-CM | POA: Diagnosis not present

## 2022-03-18 NOTE — Progress Notes (Signed)
Office Visit Note   Patient: Maria Sharp           Date of Birth: 1965-05-31           MRN: [id number] Visit Date: 03/18/2022 Requested by: Jonathon Jordan, MD [address] [place],  [place] [id number] PCP: Jonathon Jordan, MD  Subjective: Chief Complaint  Patient presents with   Right Hip - Pain    HPI: Maria Sharp is a 56 y.o. female who presents to the office reporting right hip and general joint pain.  She describes 2-year history of right hip pain.  Aleve helps her but she does not take it every day.  Hard for her to cross the leg with the right leg but not with the left leg.  Reports pain on a daily basis.  She does not report any limitation of walking endurance but she states that sometimes the right hip gets "stuck" mother does have a diagnosis of rheumatoid arthritis              ROS: All systems reviewed are negative as they relate to the chief complaint within the history of present illness.  Patient denies fevers or chills.  Assessment & Plan: Visit Diagnoses:  1. Pain in right hip     Plan: Impression is right hip arthritis.  Plan is observation.  Regarding these other multiple joint complaints I would like for her to see rheumatologist Dr. Benjamine Mola and we will also draw CBC DIF sed rate C-reactive protein anti-CCP antibody as well as rheumatoid factor uric acid and ANA.  I can call her with those results.  Follow-Up Instructions: No follow-ups on file.   Orders:  Orders Placed This Encounter  Procedures   XR HIP UNILAT W OR W/O PELVIS 2-3 VIEWS RIGHT   No orders of the defined types were placed in this encounter.     Procedures: No procedures performed   Clinical Data: No additional findings.  Objective: Vital Signs: LMP 01/13/2018   Physical Exam:  Constitutional: Patient appears well-developed HEENT:  Head: Normocephalic Eyes:EOM are normal Neck: Normal range of motion Cardiovascular: Normal rate Pulmonary/chest: Effort  normal Neurologic: Patient is alert Skin: Skin is warm Psychiatric: Patient has normal mood and affect  Ortho Exam: Ortho exam demonstrates Trendelenburg gait to the right which is mild.  She does have groin pain on the right with internal/external rotation of the right leg.  Equivocal nerve root tension signs but no paresthesias on the right-hand side L1 S1 bilaterally.  Pedal pulses palpable.  She has mild trochanteric tenderness on the right versus left.  No muscle atrophy in the right leg versus left.  Specialty Comments:  No specialty comments available.  Imaging: XR HIP UNILAT W OR W/O PELVIS 2-3 VIEWS RIGHT  Result Date: 03/18/2022 Patient's most lateral right hip reviewed.  End-stage bone-on-bone arthritis present within the right hip. With no joint space present along the lateral acetabular joint space.  No acute fracture.  Left hip joint space well-maintained.    PMFS History: Patient Active Problem List   Diagnosis Date Noted   Circadian rhythm sleep disorder, shift work type 10/03/2021   Cyst of tendon sheath 10/03/2021   Hypercalcemia 10/03/2021   Allergic rhinitis 02/01/2021   Allergic rhinitis due to animal (cat) (dog) hair and dander 02/01/2021   Allergic rhinitis due to pollen 02/01/2021   Chronic allergic conjunctivitis 02/01/2021   Mild persistent asthma, uncomplicated 09/98/3382   Seafood allergy 02/01/2021   Exposure  to COVID-19 virus 08/26/2018   Migraines 09/02/2017   DJD (degenerative joint disease) 09/02/2017   Vertigo 12/05/2016   Bilateral temporomandibular joint pain 12/05/2016   Constipation    Asthma, chronic 09/28/2014   Gastroparesis    Varicose veins 09/14/2013   Neck pain on left side 06/14/2013   Current non-adherence to medical treatment 06/14/2013   Vitamin D deficiency 10/05/2012   Leg cramps 10/05/2012   Family history of diabetes mellitus 10/05/2012   Type 2 diabetes mellitus, uncontrolled 05/28/2012   CHRONIC RHINOSINUSITIS  05/18/2010   ABDOMINAL PAIN, UNSPECIFIED SITE 05/18/2010   CHEST PAIN, INTERMITTENT 05/12/2009   Obesity 04/05/2009   CARDIOMEGALY 04/05/2009   PALPITATIONS 04/05/2009   Type 2 diabetes mellitus with peripheral circulatory disorder ([facility]) 03/15/2009   BACK PAIN 03/09/2008   Hyperlipidemia LDL goal <100 03/03/2008   Essential hypertension 03/03/2008   GERD 03/03/2008   Irritable bowel syndrome with both constipation and diarrhea 03/03/2008   Rectal bleeding 03/03/2008   Past Medical History:  Diagnosis Date   Allergy    Asthma    Diabetes mellitus    Fatty liver    Gastritis    Gastroparesis    study 11 14  nl abd [place]   GERD (gastroesophageal reflux disease)    Gout    High cholesterol    History of varicella    Hypertension    IBS (irritable bowel syndrome)     Family History  Problem Relation Age of Onset   Diabetes Mother    Heart disease Mother    Arthritis Mother    Hyperlipidemia Mother    Hypertension Mother    Parkinson's disease Mother    Thyroid disease Mother    Prostate cancer Father    Diabetes Father    Hypertension Father    Hyperlipidemia Maternal Grandmother    Hypertension Maternal Grandmother    Hyperlipidemia Maternal Grandfather    Hypertension Maternal Grandfather    Diabetes Paternal Grandmother    Arthritis Paternal Grandmother    Hyperlipidemia Paternal Grandmother    Heart disease Paternal Grandmother    Dementia Paternal Grandmother    Hypertension Paternal Grandmother    Hyperlipidemia Paternal Grandfather    Hypertension Paternal Grandfather    Diabetes Paternal Grandfather    Cholelithiasis Paternal Grandfather    Colon cancer Neg Hx    Esophageal cancer Neg Hx    Stomach cancer Neg Hx    Rectal cancer Neg Hx     Past Surgical History:  Procedure Laterality Date   ANAL RECTAL MANOMETRY N/A 04/03/2015   Procedure: ANO RECTAL MANOMETRY;  Surgeon: Mauri Pole, MD;  Location: [facility];  Service: Endoscopy;  Laterality:  N/A;   CHOLECYSTECTOMY     EYE SURGERY     REFRACTIVE SURGERY Right    WISDOM TOOTH EXTRACTION     Social History   Occupational History   Occupation: [employer] office  Tobacco Use   Smoking status: Never   Smokeless tobacco: Never  Vaping Use   Vaping Use: Never used  Substance and Sexual Activity   Alcohol use: No   Drug use: No   Sexual activity: Not on file

## 2022-03-21 LAB — CBC WITH DIFFERENTIAL/PLATELET
Absolute Monocytes: 782 cells/uL (ref 200–950)
Basophils Absolute: 28 cells/uL (ref 0–200)
Basophils Relative: 0.3 %
Eosinophils Absolute: 37 cells/uL (ref 15–500)
Eosinophils Relative: 0.4 %
HCT: 37.2 % (ref 35.0–45.0)
Hemoglobin: 12.4 g/dL (ref 11.7–15.5)
Lymphs Abs: 2512 cells/uL (ref 850–3900)
MCH: 27 pg (ref 27.0–33.0)
MCHC: 33.3 g/dL (ref 32.0–36.0)
MCV: 81 fL (ref 80.0–100.0)
MPV: 9.3 fL (ref 7.5–12.5)
Monocytes Relative: 8.5 %
Neutro Abs: 5842 cells/uL (ref 1500–7800)
Neutrophils Relative %: 63.5 %
Platelets: 380 10*3/uL (ref 140–400)
RBC: 4.59 10*6/uL (ref 3.80–5.10)
RDW: 14.5 % (ref 11.0–15.0)
Total Lymphocyte: 27.3 %
WBC: 9.2 10*3/uL (ref 3.8–10.8)

## 2022-03-21 LAB — RHEUMATOID FACTOR: Rheumatoid fact SerPl-aCnc: <14 IU/mL (ref <14)

## 2022-03-21 LAB — CYCLIC CITRUL PEPTIDE ANTIBODY, IGG: Cyclic Citrullin Peptide Ab: <16 UNITS

## 2022-03-21 LAB — ANA: Anti Nuclear Antibody (ANA): POSITIVE — AB

## 2022-03-21 LAB — URIC ACID: Uric Acid, Serum: 4.4 mg/dL (ref 2.5–7.0)

## 2022-03-21 LAB — ANTI-NUCLEAR AB-TITER (ANA TITER): ANA Titer 1: 1:160 {titer} — ABNORMAL HIGH

## 2022-03-21 LAB — SEDIMENTATION RATE: Sed Rate: 41 mm/h — ABNORMAL HIGH (ref 0–30)

## 2022-03-21 LAB — C-REACTIVE PROTEIN: CRP: 1.5 mg/L (ref <8.0)

## 2022-03-28 ENCOUNTER — Other Ambulatory Visit: Payer: Self-pay | Admitting: Obstetrics & Gynecology

## 2022-03-28 DIAGNOSIS — Z1231 Encounter for screening mammogram for malignant neoplasm of breast: Secondary | ICD-10-CM

## 2022-04-16 ENCOUNTER — Other Ambulatory Visit: Payer: Self-pay | Admitting: Gastroenterology

## 2022-04-29 ENCOUNTER — Ambulatory Visit (INDEPENDENT_AMBULATORY_CARE_PROVIDER_SITE_OTHER): Payer: 59 | Admitting: Podiatry

## 2022-04-29 ENCOUNTER — Encounter: Payer: Self-pay | Admitting: Podiatry

## 2022-04-29 ENCOUNTER — Ambulatory Visit: Payer: 59 | Admitting: Podiatry

## 2022-04-29 VITALS — BP 154/89

## 2022-04-29 DIAGNOSIS — E1151 Type 2 diabetes mellitus with diabetic peripheral angiopathy without gangrene: Secondary | ICD-10-CM

## 2022-04-29 DIAGNOSIS — B351 Tinea unguium: Secondary | ICD-10-CM

## 2022-04-29 DIAGNOSIS — M79609 Pain in unspecified limb: Secondary | ICD-10-CM | POA: Diagnosis not present

## 2022-04-29 NOTE — Progress Notes (Signed)
  Subjective:  Patient ID: Maria Sharp, female    DOB: 1966-04-09,  MRN: [id number]  Maria Sharp presents to clinic today for at risk foot care. Pt has h/o NIDDM with PAD and painful elongated mycotic toenails 1-5 bilaterally which are tender when wearing enclosed shoe gear. Pain is relieved with periodic professional debridement. Patient states she has not been consistent with applying tea tree oil to toenails due to work obligations/schedule. She has not taken her blood pressure medication this morning. Chief Complaint  Patient presents with   Nail Problem    DFC BS-did not check today A1C-6.? PCP-Sharon Wolters PCP VST-01/2022   New problem(s): None.   PCP is Jonathon Jordan, MD.  Allergies  Allergen Reactions   Ace Inhibitors Anaphylaxis    Other reaction(s): Not available   Shellfish Allergy Anaphylaxis and Other (See Comments)    Only shrimp   Januvia [Sitagliptin] Other (See Comments)    Recurrent abdominal pain with elevated pancreas enzymes   Latex Itching   Lipitor [Atorvastatin]    Shellfish-Derived Products     Other reaction(s): Not available   Review of Systems: Negative except as noted in the HPI.  Objective: No changes noted in today's physical examination. Vitals:   04/29/22 0815  BP: (!) 154/89   Maria Sharp is a pleasant 57 y.o. female obese in NAD. AAO x 3.  Vascular Examination: CFT <3 seconds b/l LE. Palpable DP pulse(s) b/l LE. Faintly palpable PT pulse(s) b/l LE. Pedal hair sparse. No pain with calf compression b/l. Lower extremity skin temperature gradient within normal limits. Trace edema noted bilateral ankles. Varicosities present b/l.  Dermatological Examination: Pedal integument with normal turgor, texture and tone b/l LE. No open wounds b/l. No interdigital macerations b/l. Toenails 1-5 b/l elongated, thickened, discolored with subungual debris. +Tenderness with dorsal palpation of nailplates. No hyperkeratotic or porokeratotic  lesions present.  Neurological Examination: Protective sensation intact 5/5 intact bilaterally with 10g monofilament b/l. Vibratory sensation intact b/l.  Musculoskeletal Examination: Normal muscle strength 5/5 to all lower extremity muscle groups bilaterally. No pain, crepitus or joint limitation noted with ROM bilateral LE. Hammertoe(s) noted to the L 5th toe. Pes planus deformity noted bilateral LE.Marland Kitchen No pain, crepitus or joint limitation noted with ROM b/l LE.  Patient ambulates independently without assistive aids.  Assessment/Plan: 1. Pain due to onychomycosis of nail   2. Type II diabetes mellitus with peripheral circulatory disorder (HCC)     No orders of the defined types were placed in this encounter.   -Patient was evaluated and treated. All patient's and/or POA's questions/concerns answered on today's visit. -She will start applying tea tree oil to toenails once daily. -Continue foot and shoe inspections daily. Monitor blood glucose per PCP/Endocrinologist's recommendations. -Toenails 1-5 b/l were debrided in length and girth with sterile nail nippers and dremel without iatrogenic bleeding.  -Patient/POA to call should there be question/concern in the interim.   Return in about 10 weeks (around 07/08/2022).  Marzetta Board, DPM

## 2022-05-01 NOTE — Progress Notes (Signed)
Now that is good thanks for checking

## 2022-05-01 NOTE — Progress Notes (Signed)
Ana up which can show lupus but ra looks neg  can you call her and then send a referral to [facility] thx

## 2022-05-06 ENCOUNTER — Encounter: Payer: Self-pay | Admitting: Gastroenterology

## 2022-05-06 ENCOUNTER — Ambulatory Visit: Payer: 59 | Admitting: Gastroenterology

## 2022-05-06 VITALS — BP 140/88 | HR 81 | Ht 70.0 in | Wt 264.0 lb

## 2022-05-06 DIAGNOSIS — K602 Anal fissure, unspecified: Secondary | ICD-10-CM | POA: Diagnosis not present

## 2022-05-06 DIAGNOSIS — K581 Irritable bowel syndrome with constipation: Secondary | ICD-10-CM

## 2022-05-06 DIAGNOSIS — K219 Gastro-esophageal reflux disease without esophagitis: Secondary | ICD-10-CM

## 2022-05-06 MED ORDER — AMBULATORY NON FORMULARY MEDICATION: 1 refills | Status: DC

## 2022-05-06 MED ORDER — OMEPRAZOLE 40 MG PO CPDR: 40.0000 mg | DELAYED_RELEASE_CAPSULE | Freq: Two times a day (BID) | ORAL | 1 refills | Status: DC

## 2022-05-06 MED ORDER — LINACLOTIDE 145 MCG PO CAPS: ORAL_CAPSULE | ORAL | 3 refills | Status: DC

## 2022-05-06 NOTE — Patient Instructions (Signed)
You have been scheduled for an endoscopy. Please follow written instructions given to you at your visit today. If you use inhalers (even only as needed), please bring them with you on the day of your procedure.   _______________________________________________________  If your blood pressure at your visit was 140/90 or greater, please contact your primary care physician to follow up on this.  _______________________________________________________  If you are age 49 or older, your body mass index should be between 23-30. Your Body mass index is 37.88 kg/m. If this is out of the aforementioned range listed, please consider follow up with your Primary Care Provider.  If you are age 89 or younger, your body mass index should be between 19-25. Your Body mass index is 37.88 kg/m. If this is out of the aformentioned range listed, please consider follow up with your Primary Care Provider.   ________________________________________________________  The [facility] providers would like to encourage you to use [facility] to communicate with providers for non-urgent requests or questions.  Due to long hold times on the telephone, sending your provider a message by [facility] may be a faster and more efficient way to get a response.  Please allow 48 business hours for a response.  Please remember that this is for non-urgent requests.  _______________________________________________________   We have sent the following medications to your pharmacy for you to pick up at your convenience:  Diltiazem  Omeprazole  Linzess     Follow up in 3 months   I appreciate the  opportunity to care for you  Thank You   Harl Bowie , MD

## 2022-05-06 NOTE — Progress Notes (Signed)
Maria Sharp    [id number]    May 21, 1965  Primary Care Physician:Wolters, Ivin Booty, MD  Referring Physician: Jonathon Jordan, MD [place] [place],  [place] [address]   Chief complaint: IBS, GERD  HPI: 57 yr very pleasant F follow-up visit for chronic irritable bowel syndrome with alternating constipation and diarrhea.   She continues to have irregular bowel habits with alternating diarrhea and constipation, feels IBS symptoms gotten worse after she started new medication Rybelsus but have seem to have settled in the past few weeks.  She is using Linzess and dicyclomine as needed She has abdominal bloating and excessive gas.  She cut down on cheese and milk. No dysphagia, vomiting, weight loss or decreased appetite   Persistent GERD symptoms despite omeprazole twice daily, she has severe breakthrough heartburn and symptoms if she forgets to take omeprazole.  Complains of epigastric discomfort   Colonoscopy March 26, 2018 with removal of 2 small subcentimeter sessile tubular adenomatous polyps and internal hemorrhoids     Outpatient Encounter Medications as of 57/15/2024  Medication Sig   albuterol (PROVENTIL HFA;VENTOLIN HFA) 108 (90 Base) MCG/ACT inhaler INHALE 2 PUFFS Q 4 TO 6 H PRN   amLODipine-olmesartan (AZOR) 5-40 MG tablet Take 1 tablet by mouth daily.   azelastine (ASTELIN) 0.1 % nasal spray U 1 TO 2 SPRAYS IEN BID   blood glucose meter kit and supplies KIT Dispense based on patient and insurance preference. Use up to four times daily as directed. (FOR ICD-9 250.00, 250.01).   celecoxib (CELEBREX) 100 MG capsule Take 100 mg by mouth as needed.   Cholecalciferol (D3-1000 PO) Take 1,000 mg by mouth daily.    dicyclomine (BENTYL) 20 MG tablet Take 1 tablet (20 mg total) by mouth 3 (three) times daily before meals. (Patient taking differently: Take 20 mg by mouth as needed.)   EPINEPHrine (EPI-PEN) 0.3 mg/0.3 mL DEVI Inject 0.3 mg into the  muscle once as needed. For severe allergic reaction   FLOVENT HFA 110 MCG/ACT inhaler Inhale 2 puffs into the lungs 2 (two) times daily.   furosemide (LASIX) 20 MG tablet Take 20 mg by mouth daily as needed for edema.   glucose blood test strip USE TO TEST 4 TIMES A DAY   Lancets (ONETOUCH DELICA PLUS [id number]) MISC USE TO TEST UP TO 4 TIMES A DAY   LINZESS 145 MCG CAPS capsule TAKE 1 CAPSULE BY MOUTH DAILY BEFORE BREAKFAST.   montelukast (SINGULAIR) 10 MG tablet 1 tablet in the evening   Multiple Vitamins-Minerals (ALIVE WOMENS 50+) TABS See admin instructions.   Multiple Vitamins-Minerals (HAIR VITAMINS PO) Take by mouth. Vivastol   omeprazole (PRILOSEC) 40 MG capsule Take 1 capsule (40 mg total) by mouth in the morning and at bedtime.   rosuvastatin (CRESTOR) 5 MG tablet Take 0.5 tablets (2.5 mg total) by mouth every other day. (Patient taking differently: Take 5 mg by mouth 2 (two) times a week.)   RYBELSUS 3 MG TABS Take 1 tablet by mouth every morning.   Simethicone 80 MG TABS Take 1 tablet (80 mg total) by mouth 3 (three) times daily. (Patient taking differently: Take 1 tablet by mouth as needed.)   spironolactone (ALDACTONE) 25 MG tablet Take 1 tablet (25 mg total) by mouth 2 (two) times daily. (Patient taking differently: Take 25 mg by mouth daily.)   vitamin B-12 (CYANOCOBALAMIN) 1000 MCG tablet Take 1,000 mcg by mouth daily.   vitamin  E 400 UNIT capsule Take 400 Units by mouth daily.   Zinc 100 MG TABS 1 tablet   diltiazem 2 % GEL apply small pea-sized amount per rectum 3 times daily for 2 months (Patient not taking: Reported on 05/06/2022)   hydrocortisone (ANUSOL-HC) 25 MG suppository Place 1 suppository (25 mg total) rectally at bedtime. (Patient not taking: Reported on 05/06/2022)   [DISCONTINUED] azelastine (OPTIVAR) 0.05 % ophthalmic solution 1 drop into affected eye   [DISCONTINUED] chlorthalidone (HYGROTON) 25 MG tablet Take 25 mg by mouth daily.   [DISCONTINUED] linaclotide  (LINZESS) 290 MCG CAPS capsule Take 1 capsule (290 mcg total) by mouth daily before breakfast.   [DISCONTINUED] olmesartan (BENICAR) 40 MG tablet Take 1 tablet (40 mg total) by mouth daily.   [DISCONTINUED] prednisoLONE acetate (PRED FORTE) 1 % ophthalmic suspension Place 1 drop into the right eye 4 (four) times daily.   No facility-administered encounter medications on file as of 57/15/2024.    Allergies as of 05/06/2022 - Review Complete 05/06/2022  Allergen Reaction Noted   Ace inhibitors Anaphylaxis 03/03/2008   Shellfish allergy Anaphylaxis and Other (See Comments) 05/25/2011   Januvia [sitagliptin] Other (See Comments) 10/05/2012   Latex Itching    Lipitor [atorvastatin]  02/10/2013   Shellfish-derived products  01/11/2022    Past Medical History:  Diagnosis Date   Allergy    Asthma    Diabetes mellitus    Fatty liver    Gastritis    Gastroparesis    study 11 14  nl abd [place]   GERD (gastroesophageal reflux disease)    Gout    High cholesterol    History of varicella    Hypertension    IBS (irritable bowel syndrome)     Past Surgical History:  Procedure Laterality Date   ANAL RECTAL MANOMETRY N/A 04/03/2015   Procedure: ANO RECTAL MANOMETRY;  Surgeon: Mauri Pole, MD;  Location: [facility];  Service: Endoscopy;  Laterality: N/A;   CHOLECYSTECTOMY     EYE SURGERY     REFRACTIVE SURGERY Right    WISDOM TOOTH EXTRACTION      Family History  Problem Relation Age of Onset   Diabetes Mother    Heart disease Mother    Arthritis Mother    Hyperlipidemia Mother    Hypertension Mother    Parkinson's disease Mother    Thyroid disease Mother    Prostate cancer Father    Diabetes Father    Hypertension Father    Hyperlipidemia Maternal Grandmother    Hypertension Maternal Grandmother    Hyperlipidemia Maternal Grandfather    Hypertension Maternal Grandfather    Diabetes Paternal Grandmother    Arthritis Paternal Grandmother    Hyperlipidemia Paternal  Grandmother    Heart disease Paternal Grandmother    Dementia Paternal Grandmother    Hypertension Paternal Grandmother    Hyperlipidemia Paternal Grandfather    Hypertension Paternal Grandfather    Diabetes Paternal Grandfather    Cholelithiasis Paternal Grandfather    Colon cancer Neg Hx    Esophageal cancer Neg Hx    Stomach cancer Neg Hx    Rectal cancer Neg Hx     Social History   Socioeconomic History   Marital status: Married    Spouse name: Not on file   Number of children: 0   Years of education: Not on file   Highest education level: Not on file  Occupational History   Occupation: Post office  Tobacco Use   Smoking status: Never  Smokeless tobacco: Never  Vaping Use   Vaping Use: Never used  Substance and Sexual Activity   Alcohol use: No   Drug use: No   Sexual activity: Not on file  Other Topics Concern   Not on file  Social History Narrative   Usually # of hours of sleep per night: 6   3 of people living at your residence? 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   Irregular sleep 7:30 to 11 or 12 and then 4 to 6:30 before she goes to work   Has attended college.   Originally from [place]   Abnormal pap remote last 2012    Social Determinants of Health   Financial Resource Strain: Not on file  Food Insecurity: Not on file  Transportation Needs: Not on file  Physical Activity: Not on file  Stress: Not on file  Social Connections: Not on file  Intimate Partner Violence: Not on file      Review of systems: All other review of systems negative except as mentioned in the HPI.   Physical Exam: Vitals:   05/06/22 0928  BP: (Abnormal) 140/88  Pulse: 81  SpO2: 98%   Body mass index is 37.88 kg/m. Gen:      No acute distress HEENT:  sclera anicteric Abd:      soft, non-tender; no palpable masses, no distension Ext:    No  edema Neuro: alert and oriented x 3 Psych: normal mood and affect  Data Reviewed:  Reviewed labs, radiology imaging, old records and pertinent past GI work up   Assessment and Plan/Recommendations:                                                                                                                                Maria Sharp                                              [id number]                                          02/21/1966   Primary Care Physician:Wolters, Ivin Booty, MD   Referring Physician: Jonathon Jordan, MD [place] [place],  [place] [address]     Chief complaint:  GERD   HPI:   9 yr very pleasant F follow-up visit for chronic irritable bowel syndrome with alternating constipation and diarrhea.   She continues to have irregular bowel habits with alternating diarrhea and constipation, feels IBS symptoms gotten worse after she started new medication Rybelsus but have  seem to have settled in the past few weeks.  She is using Linzess and dicyclomine as needed She has abdominal bloating and excessive gas.  She cut down on cheese and milk. No dysphagia, vomiting, weight loss or decreased appetite    Colonoscopy March 26, 2018 with removal of 2 small subcentimeter sessile tubular adenomatous polyps and internal hemorrhoids           Outpatient Encounter Medications as of 03/05/2022  Medication Sig   amLODipine-olmesartan (AZOR) 5-40 MG tablet Take 1 tablet by mouth daily.   albuterol (PROVENTIL HFA;VENTOLIN HFA) 108 (90 Base) MCG/ACT inhaler INHALE 2 PUFFS Q 4 TO 6 H PRN   azelastine (ASTELIN) 0.1 % nasal spray U 1 TO 2 SPRAYS IEN BID   azelastine (OPTIVAR) 0.05 % ophthalmic solution 1 drop into affected eye   blood glucose meter kit and supplies KIT Dispense based on patient and insurance preference. Use up to four times daily as directed. (FOR ICD-9 250.00, 250.01).   celecoxib (CELEBREX) 100 MG capsule Take 100 mg by mouth as  needed.   chlorthalidone (HYGROTON) 25 MG tablet Take 25 mg by mouth daily. (Patient not taking: Reported on 03/05/2022)   Cholecalciferol (D3-1000 PO) Take 1,000 mg by mouth daily.    dicyclomine (BENTYL) 20 MG tablet Take 1 tablet (20 mg total) by mouth 3 (three) times daily before meals.   diltiazem 2 % GEL apply small pea-sized amount per rectum 3 times daily for 2 months   EPINEPHrine (EPI-PEN) 0.3 mg/0.3 mL DEVI Inject 0.3 mg into the muscle once as needed. For severe allergic reaction   FLOVENT HFA 110 MCG/ACT inhaler Inhale 2 puffs into the lungs 2 (two) times daily.   furosemide (LASIX) 20 MG tablet Take 20 mg by mouth daily.   glucose blood test strip USE TO TEST 4 TIMES A DAY   hydrocortisone (ANUSOL-HC) 25 MG suppository Place 1 suppository (25 mg total) rectally at bedtime.   Lancets (ONETOUCH DELICA PLUS [id number]) MISC USE TO TEST UP TO 4 TIMES A DAY   linaclotide (LINZESS) 145 MCG CAPS capsule Take 1 capsule (145 mcg total) by mouth daily before breakfast.   montelukast (SINGULAIR) 10 MG tablet 1 tablet in the evening   Multiple Vitamins-Minerals (ALIVE WOMENS 50+) TABS See admin instructions.   Multiple Vitamins-Minerals (HAIR VITAMINS PO) Take by mouth. Vivastol   olmesartan (BENICAR) 40 MG tablet Take 1 tablet (40 mg total) by mouth daily. (Patient not taking: Reported on 03/05/2022)   omeprazole (PRILOSEC) 40 MG capsule TAKE 1 CAPSULE (40 MG TOTAL) BY MOUTH IN THE MORNING AND AT BEDTIME.   prednisoLONE acetate (PRED FORTE) 1 % ophthalmic suspension Place 1 drop into the right eye 4 (four) times daily.   rosuvastatin (CRESTOR) 5 MG tablet Take 0.5 tablets (2.5 mg total) by mouth every other day. (Patient taking differently: Take 5 mg by mouth 2 (two) times a week.)   RYBELSUS 3 MG TABS Take 1 tablet by mouth every morning.   Simethicone 80 MG TABS Take 1 tablet (80 mg total) by mouth 3 (three) times daily.   spironolactone (ALDACTONE) 25 MG tablet Take 1 tablet (25 mg total)  by mouth 2 (two) times daily. (Patient taking differently: Take 25 mg by mouth daily.)   vitamin B-12 (CYANOCOBALAMIN) 1000 MCG tablet Take 1,000 mcg by mouth daily.   vitamin E 400 UNIT capsule Take 400 Units by mouth daily.   Zinc 100 MG TABS 1 tablet    No facility-administered encounter  medications on file as of 03/05/2022.           Allergies as of 03/05/2022 - Review Complete 03/05/2022  Allergen Reaction Noted   Ace inhibitors Anaphylaxis 03/03/2008   Shellfish allergy Anaphylaxis and Other (See Comments) 05/25/2011   Januvia [sitagliptin] Other (See Comments) 10/05/2012   Latex Itching     Lipitor [atorvastatin]   02/10/2013   Shellfish-derived products   01/11/2022          Past Medical History:  Diagnosis Date   Allergy     Asthma     Diabetes mellitus     Fatty liver     Gastritis     Gastroparesis      study 11 14  nl abd [place]   GERD (gastroesophageal reflux disease)     Gout     High cholesterol     History of varicella     Hypertension     IBS (irritable bowel syndrome)             Past Surgical History:  Procedure Laterality Date   ANAL RECTAL MANOMETRY N/A 04/03/2015    Procedure: ANO RECTAL MANOMETRY;  Surgeon: Mauri Pole, MD;  Location: [facility];  Service: Endoscopy;  Laterality: N/A;   CHOLECYSTECTOMY       EYE SURGERY       REFRACTIVE SURGERY Right     WISDOM TOOTH EXTRACTION               Family History  Problem Relation Age of Onset   Diabetes Mother     Heart disease Mother     Arthritis Mother     Hyperlipidemia Mother     Hypertension Mother     Parkinson's disease Mother     Thyroid disease Mother     Prostate cancer Father     Diabetes Father     Hypertension Father     Hyperlipidemia Maternal Grandmother     Hypertension Maternal Grandmother     Hyperlipidemia Maternal Grandfather     Hypertension Maternal Grandfather     Diabetes Paternal Grandmother     Arthritis Paternal Grandmother     Hyperlipidemia Paternal  Grandmother     Heart disease Paternal Grandmother     Dementia Paternal Grandmother     Hypertension Paternal Grandmother     Hyperlipidemia Paternal Grandfather     Hypertension Paternal Grandfather     Diabetes Paternal Grandfather     Cholelithiasis Paternal Grandfather     Colon cancer Neg Hx     Esophageal cancer Neg Hx     Stomach cancer Neg Hx     Rectal cancer Neg Hx        Social History         Socioeconomic History   Marital status: Married      Spouse name: Not on file   Number of children: 0   Years of education: Not on file   Highest education level: Not on file  Occupational History   Occupation: Post office  Tobacco Use   Smoking status: Never   Smokeless tobacco: Never  Vaping Use   Vaping Use: Never used  Substance and Sexual Activity   Alcohol use: No   Drug use: No   Sexual activity: Not on file  Other Topics Concern   Not on file  Social History Narrative    Usually # of hours of sleep per night: 6    3 of people living at your  residence? 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    Irregular sleep 7:30 to 11 or 12 and then 4 to 6:30 before she goes to work    Has attended college.    Originally from [place]    Abnormal pap remote last 2012     Social Determinants of Health    Financial Resource Strain: Not on file  Food Insecurity: Not on file  Transportation Needs: Not on file  Physical Activity: Not on file  Stress: Not on file  Social Connections: Not on file  Intimate Partner Violence: Not on file          Review of systems: All other review of systems negative except as mentioned in the HPI.     Physical Exam:    Vitals:    03/05/22 1040  BP: 138/82  Pulse: 86    Body mass index is 38.14 kg/m. Gen:       No acute distress HEENT:  sclera anicteric Abd:        soft, non-tender; no palpable  masses, no distension Ext:         No edema Neuro: alert and oriented x 3 Psych: normal mood and affect   Data Reviewed:   Reviewed labs, radiology imaging, old records and pertinent past GI work up     Assessment and Plan/Recommendations:   57 year old very pleasant female with chronic irritable bowel syndrome with alternating constipation and diarrhea Anorectal exam, has posterior anal fissure Use diltiazem gel 2% 3 times daily for 8 weeks Benefiber 1 tablespoon 2-3 times daily with meals Increase Linzess to 290 mcg daily Increase dietary fiber and fluid intake     Use dicyclomine 20 mg twice daily as needed for abdominal cramping and bloating   2 diminutive adenomatous colon polyps removed in December 2019, due for surveillance in December 2026   GERD: Persistent GERD symptoms despite twice daily PPI, will schedule for EGD for further evaluation continue omeprazole 40 mg twice daily and  antireflux measures The risks and benefits as well as alternatives of endoscopic procedure(s) have been discussed and reviewed. All questions answered. The patient agrees to proceed.    Return in 3 months or sooner if needed      This visit required 40 minutes of patient care (this includes precharting, chart review, review of results, face-to-face time used for counseling as well as treatment plan and follow-up. The patient was provided an opportunity to ask questions and all were answered. The patient agreed with the plan and demonstrated an understanding of the instructions.  Damaris Hippo , MD    CC: Jonathon Jordan, MD

## 2022-05-07 ENCOUNTER — Other Ambulatory Visit: Payer: Self-pay | Admitting: Physician Assistant

## 2022-05-07 ENCOUNTER — Other Ambulatory Visit (HOSPITAL_BASED_OUTPATIENT_CLINIC_OR_DEPARTMENT_OTHER): Payer: Self-pay | Admitting: Family Medicine

## 2022-05-07 ENCOUNTER — Ambulatory Visit (HOSPITAL_BASED_OUTPATIENT_CLINIC_OR_DEPARTMENT_OTHER)
Admission: RE | Admit: 2022-05-07 | Discharge: 2022-05-07 | Disposition: A | Discharge status: Home / Self Care | Payer: 59 | Source: Ambulatory Visit | Attending: Family Medicine | Admitting: Family Medicine

## 2022-05-07 DIAGNOSIS — R600 Localized edema: Secondary | ICD-10-CM

## 2022-05-07 DIAGNOSIS — M79604 Pain in right leg: Secondary | ICD-10-CM

## 2022-05-07 DIAGNOSIS — M79661 Pain in right lower leg: Secondary | ICD-10-CM | POA: Diagnosis not present

## 2022-05-09 ENCOUNTER — Inpatient Hospital Stay: Admission: RE | Admit: 2022-05-09 | Payer: 59 | Source: Ambulatory Visit

## 2022-05-24 ENCOUNTER — Ambulatory Visit: Payer: 59

## 2022-05-27 ENCOUNTER — Encounter: Payer: Self-pay | Admitting: Gastroenterology

## 2022-05-27 ENCOUNTER — Other Ambulatory Visit: Payer: Self-pay | Admitting: *Deleted

## 2022-05-27 DIAGNOSIS — M79604 Pain in right leg: Secondary | ICD-10-CM

## 2022-05-31 ENCOUNTER — Encounter: Payer: 59 | Admitting: Gastroenterology

## 2022-06-07 ENCOUNTER — Ambulatory Visit (HOSPITAL_COMMUNITY): Payer: 59

## 2022-06-07 ENCOUNTER — Encounter: Payer: 59 | Admitting: Vascular Surgery

## 2022-06-30 NOTE — Progress Notes (Signed)
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 female here for evaluation of multiple joint pains and positive ANA and elevated sedimentation rate. She saw Dr. Marlou Sa for hip pain with advanced osteoarthritis but also had more widespread joint pain and family history of rheumatoid arthritis.  She reports joint muscle pains have been problematic for about 10 years with no specific acute onset.  She has morning stiffness lasting for minutes every day and gets somewhat better once up and moving.  She does not notice a lot of joint swelling occasionally accumulates fluid around her feet and ankles usually after spending several hours sitting still or up on her feet.  She takes Aleve as needed for the symptoms estimates taking 2 regular strength together 2-3 times per week.  Does not like muscle relaxer medication has had excessive sleepiness when on this before. She experienced a very high stress level with COVID with initial outbreak and social distancing with very high anxiety associated with generalized alopecia and lost a lot of weight down to 200 pounds.  More recently she has been recovering with hair coming back all over and weight gain. She stopped having regular menstrual periods since 1 to 2 years ago she has some perimenopausal symptoms most bothersome is waking up at night and unable to get back to sleep.  Not associated with any hot flashes or other skin changes. She has dry eyes and dry skin symptoms.  The dry skin was previously much worse but got better with some control of her diabetes. She has chronic irritable bowel symptoms with mixed constipation and diarrhea associated with her diabetic  gastroparesis. Otherwise she denies any oral nasal ulcers, photosensitivity, skin rashes, abnormal bleeding or blood clots.  Labs reviewed ANA 1:160 homogenous RF neg CCP neg ESR 41 CRP 1.5 Uric acid 4.4  Activities of Daily Living:  Patient reports morning stiffness for 2-3 minutes.   Patient Denies nocturnal pain.  Difficulty dressing/grooming: Denies Difficulty climbing stairs: Denies Difficulty getting out of chair: Denies Difficulty using hands for taps, buttons, cutlery, and/or writing: Denies  Review of Systems  Constitutional:  Positive for fatigue.  HENT:  Negative for mouth sores and mouth dryness.   Eyes:  Positive for dryness.  Respiratory:  Negative for shortness of breath.   Cardiovascular:  Negative for chest pain and palpitations.  Gastrointestinal:  Positive for constipation and diarrhea. Negative for blood in stool.  Endocrine: Negative for increased urination.  Genitourinary:  Negative for involuntary urination.  Musculoskeletal:  Positive for joint pain, joint pain, morning stiffness and muscle tenderness. Negative for gait problem, joint swelling, myalgias, muscle weakness and myalgias.  Skin:  Positive for hair loss. Negative for color change, rash and sensitivity to sunlight.  Allergic/Immunologic: Negative for susceptible to infections.  Neurological:  Negative for dizziness and headaches.  Hematological:  Negative for swollen glands.  Psychiatric/Behavioral:  Positive for sleep disturbance. Negative for depressed mood. The patient is not nervous/anxious.     PMFS History:  Patient Active Problem List   Diagnosis Date Noted   Positive ANA (  antinuclear antibody) 07/01/2022   Polyarthralgia 07/01/2022   Circadian rhythm sleep disorder, shift work type 10/03/2021   Cyst of tendon sheath 10/03/2021   Hypercalcemia 10/03/2021   Allergic rhinitis 02/01/2021   Allergic rhinitis due to animal (cat) (dog) hair and dander 02/01/2021   Allergic rhinitis due  to pollen 02/01/2021   Chronic allergic conjunctivitis 02/01/2021   Mild persistent asthma, uncomplicated [id number]   Seafood allergy 02/01/2021   Exposure to COVID-19 virus 08/26/2018   Migraines 09/02/2017   DJD (degenerative joint disease) 09/02/2017   Vertigo 12/05/2016   Bilateral temporomandibular joint pain 12/05/2016   Constipation    Asthma, chronic 09/28/2014   Gastroparesis    Varicose veins 09/14/2013   Neck pain on left side 06/14/2013   Current non-adherence to medical treatment 06/14/2013   Vitamin D deficiency 10/05/2012   Leg cramps 10/05/2012   Family history of diabetes mellitus 10/05/2012   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   ABDOMINAL PAIN, UNSPECIFIED SITE 05/18/2010   CHEST PAIN, INTERMITTENT 05/12/2009   Obesity 04/05/2009   CARDIOMEGALY 04/05/2009   PALPITATIONS 04/05/2009   Type 2 diabetes mellitus with peripheral circulatory disorder ([facility]) 03/15/2009   BACK PAIN 03/09/2008   Hyperlipidemia LDL goal <100 03/03/2008   Essential hypertension 03/03/2008   GERD 03/03/2008   Irritable bowel syndrome with both constipation and diarrhea 03/03/2008   Rectal bleeding 03/03/2008    Past Medical History:  Diagnosis Date   Allergy    Asthma    Diabetes mellitus    Fatty liver    Gastritis    Gastroparesis    study 11 14  nl abd [place]   GERD (gastroesophageal reflux disease)    Gout    High cholesterol    History of varicella    Hypertension    IBS (irritable bowel syndrome)     Family History  Problem Relation Age of Onset   Diabetes Mother    Heart disease Mother    Arthritis Mother    Hyperlipidemia Mother    Hypertension Mother    Parkinson's disease Mother    Thyroid disease Mother    Osteoarthritis Mother    Prostate cancer Father    Diabetes Father    Hypertension Father    Rheum arthritis Father    Arthritis Sister    Hyperlipidemia Maternal Grandmother    Hypertension Maternal Grandmother    Hyperlipidemia Maternal Grandfather     Hypertension Maternal Grandfather    Diabetes Paternal Grandmother    Arthritis Paternal Grandmother    Hyperlipidemia Paternal Grandmother    Heart disease Paternal Grandmother    Dementia Paternal Grandmother    Hypertension Paternal Grandmother    Hyperlipidemia Paternal Grandfather    Hypertension Paternal Grandfather    Diabetes Paternal Grandfather    Cholelithiasis Paternal Grandfather    Sickle cell trait Maternal Uncle    Colon cancer Neg Hx    Esophageal cancer Neg Hx    Stomach cancer Neg Hx    Rectal cancer Neg Hx    Past Surgical History:  Procedure Laterality Date   ANAL RECTAL MANOMETRY N/A 04/03/2015   Procedure: ANO RECTAL MANOMETRY;  Surgeon: Mauri Pole, MD;  Location: [facility];  Service: Endoscopy;  Laterality: N/A;   CHOLECYSTECTOMY     EYE SURGERY     REFRACTIVE SURGERY Right    WISDOM TOOTH EXTRACTION     Social History   Social History Narrative   Usually # of hours of sleep per night: 6   3 of  people living at your residence? 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   Irregular sleep 7:30 to 11 or 12 and then 4 to 6:30 before she goes to work   Has attended college.   Originally from [place].   [place]   Abnormal pap remote last 2012    Immunization History  Administered Date(s) Administered   Influenza, High Dose Seasonal PF 03/16/2018, 01/22/2021   Influenza,inj,Quad PF,6+ Mos 02/08/2013, 02/07/2014, 02/17/2017, 03/02/2018   Influenza-Unspecified 02/07/2014, 01/08/2021   PFIZER(Purple Top)SARS-COV-2 Vaccination 06/28/2019, 07/26/2019, 11/29/2019, 02/24/2020   Pneumococcal Conjugate-13 02/08/2013   Pneumococcal Polysaccharide-23 12/20/2013, 02/14/2016, 01/21/2017, 03/16/2018, 01/22/2021   Pneumococcal-Unspecified 04/22/2004   Tdap 01/01/2012, 06/14/2013     Objective: Vital Signs: BP 130/80 (BP Location: Right Arm,  Patient Position: Sitting, Cuff Size: Large)   Pulse 79   Resp 16   Ht 5' 10.5" (1.791 m)   Wt 265 lb (120.2 kg)   LMP 01/13/2018   BMI 37.49 kg/m    Physical Exam Constitutional:      Appearance: She is obese.  Eyes:     Conjunctiva/sclera: Conjunctivae normal.  Cardiovascular:     Rate and Rhythm: Normal rate and regular rhythm.  Pulmonary:     Effort: Pulmonary effort is normal.     Breath sounds: Normal breath sounds.  Musculoskeletal:     Right lower leg: No edema.     Left lower leg: No edema.  Lymphadenopathy:     Cervical: No cervical adenopathy.  Skin:    General: Skin is warm and dry.     Findings: No rash.  Neurological:     Mental Status: She is alert.  Psychiatric:        Mood and Affect: Mood normal.      Musculoskeletal Exam:  Neck full ROM no tenderness Shoulders full ROM no tenderness or swelling, left side provokes some pain with full abduction Elbows full ROM no tenderness or swelling Wrists full ROM no tenderness or swelling Fingers full ROM no tenderness or swelling Right hip limited internal rotation, left hip normal Right knee crepitus and slightly limited ROM with some guarding, left knee normal ROM with minimal crepitus Ankles full ROM no tenderness or swelling   Investigation: No additional findings.  Imaging: No results found.  Recent Labs: Lab Results  Component Value Date   WBC 9.2 03/18/2022   HGB 12.4 03/18/2022   PLT 380 03/18/2022   NA 140 03/02/2018   K 4.0 03/02/2018   CL 97 03/02/2018   CO2 26 03/02/2018   GLUCOSE 123 (H) 03/02/2018   BUN 13 03/02/2018   CREATININE 0.70 03/02/2018   BILITOT 0.3 03/02/2018   ALKPHOS 59 03/02/2018   AST 12 03/02/2018   ALT 17 03/02/2018   PROT 7.3 03/02/2018   ALBUMIN 4.2 03/02/2018   CALCIUM 10.0 03/02/2018   GFRAA 116 03/02/2018    Speciality Comments: No specialty comments available.  Procedures:  No procedures performed Allergies: Ace inhibitors, Shellfish allergy,  Januvia [sitagliptin], Latex, Lipitor [atorvastatin], and Shellfish-derived products   Assessment / Plan:     Visit Diagnoses: Positive ANA (antinuclear antibody) - Plan: RNP Antibody, Anti-Smith antibody, Sjogrens syndrome-A extractable nuclear antibody, Sjogrens syndrome-B extractable nuclear antibody, Anti-DNA antibody, double-stranded, C3 and C4, Sedimentation rate  Positive ANA with multiple joint pains though no appreciable synovitis or other specific clinical criteria on exam  today.  Checking more extensive antibody panel as detailed above also sedimentation rate for other evidence of systemic inflammation.  With somewhat nonspecific exam and known underlying degenerative disease would not recommend empiric medication trial if results all negative.  Type 2 diabetes mellitus with peripheral circulatory disorder ([facility])  Has had varying control with her diabetes also contributory to her dryness symptoms complaints also gastroparesis.  Has some degree of varicose veins or mild peripheral venous insufficiency symptoms had previous ablation.  No particular evidence of stasis dermatitis and symptoms of swelling at the feet is likely accounted for by this but not most of her leg pain.  Polyarthralgia  Overall suspicion may be that symptoms are more related to degenerative arthritis may be having additional effects with change in weight and deconditioning.  Right hip osteoarthritis is severe based on November x-ray.  Also complains of fatigue, migraines, and irritable bowel symptoms can sometimes be associated with chronic pain disorder such as fibromyalgia syndrome but does not have widespread sensitivity on exam today.  With onset of menopause within the past 2 years or so also cannot entirely exclude possible contribution of perimenopausal or climacteric arthralgia as well.  Orders: Orders Placed This Encounter  Procedures   RNP Antibody   Anti-Smith antibody   Sjogrens syndrome-A extractable  nuclear antibody   Sjogrens syndrome-B extractable nuclear antibody   Anti-DNA antibody, double-stranded   C3 and C4   Sedimentation rate   No orders of the defined types were placed in this encounter.    Follow-Up Instructions: Return if symptoms worsen or fail to improve.   Fuller Plan, MD  Note - This record has been created using [employer].  Chart creation errors have been sought, but may not always  have been located. Such creation errors do not reflect on  the standard of medical care.

## 2022-07-01 ENCOUNTER — Ambulatory Visit: Payer: 59 | Attending: Internal Medicine | Admitting: Internal Medicine

## 2022-07-01 ENCOUNTER — Encounter: Payer: Self-pay | Admitting: Internal Medicine

## 2022-07-01 VITALS — BP 130/80 | HR 79 | Resp 16 | Ht 70.5 in | Wt 265.0 lb

## 2022-07-01 DIAGNOSIS — R768 Other specified abnormal immunological findings in serum: Secondary | ICD-10-CM

## 2022-07-01 DIAGNOSIS — E1151 Type 2 diabetes mellitus with diabetic peripheral angiopathy without gangrene: Secondary | ICD-10-CM | POA: Diagnosis not present

## 2022-07-01 DIAGNOSIS — M2550 Pain in unspecified joint: Secondary | ICD-10-CM | POA: Diagnosis not present

## 2022-07-02 LAB — ANTI-SMITH ANTIBODY: ENA SM Ab Ser-aCnc: <1 AI

## 2022-07-02 LAB — C3 AND C4
C3 Complement: 178 mg/dL (ref 83–193)
C4 Complement: 46 mg/dL (ref 15–57)

## 2022-07-02 LAB — SEDIMENTATION RATE: Sed Rate: 28 mm/h (ref 0–30)

## 2022-07-02 LAB — SJOGRENS SYNDROME-A EXTRACTABLE NUCLEAR ANTIBODY: SSA (Ro) (ENA) Antibody, IgG: <1 AI

## 2022-07-02 LAB — SJOGRENS SYNDROME-B EXTRACTABLE NUCLEAR ANTIBODY: SSB (La) (ENA) Antibody, IgG: <1 AI

## 2022-07-02 LAB — ANTI-DNA ANTIBODY, DOUBLE-STRANDED: ds DNA Ab: <1 IU/mL

## 2022-07-02 LAB — RNP ANTIBODY: Ribonucleic Protein(ENA) Antibody, IgG: <1 AI

## 2022-07-15 ENCOUNTER — Ambulatory Visit (AMBULATORY_SURGERY_CENTER): Payer: 59 | Admitting: Gastroenterology

## 2022-07-15 ENCOUNTER — Encounter: Payer: Self-pay | Admitting: Gastroenterology

## 2022-07-15 ENCOUNTER — Telehealth: Payer: Self-pay

## 2022-07-15 VITALS — BP 144/88 | HR 70 | Temp 97.3°F | Resp 10 | Ht 70.0 in | Wt 264.0 lb

## 2022-07-15 DIAGNOSIS — K317 Polyp of stomach and duodenum: Secondary | ICD-10-CM

## 2022-07-15 DIAGNOSIS — K2950 Unspecified chronic gastritis without bleeding: Secondary | ICD-10-CM

## 2022-07-15 DIAGNOSIS — K219 Gastro-esophageal reflux disease without esophagitis: Secondary | ICD-10-CM

## 2022-07-15 MED ORDER — SUCRALFATE 1 GM/10ML PO SUSP: 1.0000 g | Freq: Two times a day (BID) | ORAL | 0 refills | Status: DC

## 2022-07-15 MED ORDER — SODIUM CHLORIDE 0.9 % IV SOLN: 500.0000 mL | Freq: Once | INTRAVENOUS | Status: DC

## 2022-07-15 NOTE — Progress Notes (Unsigned)
[facility] History and Physical   Primary Care Physician:  Jonathon Jordan, MD   Reason for Procedure:  Persistent GERD symptoms despite treatment with PPI  Plan:    EGD with possible interventions as needed     HPI: Maria Sharp is a very pleasant 57 y.o. female here for EGD for evaluation of persistent GERD symptoms despite treatment with PPI.  Please refer to office visit note 05/06/22 for additional details  The risks and benefits as well as alternatives of endoscopic procedure(s) have been discussed and reviewed. All questions answered. The patient agrees to proceed.    Past Medical History:  Diagnosis Date   Allergy    Asthma    Diabetes mellitus    Fatty liver    Gastritis    Gastroparesis    study 11 14  nl abd [place]   GERD (gastroesophageal reflux disease)    Gout    High cholesterol    History of varicella    Hypertension    IBS (irritable bowel syndrome)     Past Surgical History:  Procedure Laterality Date   ANAL RECTAL MANOMETRY N/A 04/03/2015   Procedure: ANO RECTAL MANOMETRY;  Surgeon: Mauri Pole, MD;  Location: [facility];  Service: Endoscopy;  Laterality: N/A;   CHOLECYSTECTOMY     EYE SURGERY     REFRACTIVE SURGERY Right    WISDOM TOOTH EXTRACTION      Prior to Admission medications   Medication Sig Start Date End Date Taking? Authorizing Provider  amLODipine-olmesartan (AZOR) 5-40 MG tablet Take 1 tablet by mouth daily.   Yes [provider]  ARNUITY ELLIPTA 200 MCG/ACT AEPB 1 puff Inhalation Once a day for 30 days 06/27/22  Yes [provider]  azelastine (ASTELIN) 0.1 % nasal spray U 1 TO 2 SPRAYS IEN BID 03/16/18  Yes [provider]  Cholecalciferol (D3-1000 PO) Take 1,000 mg by mouth daily.    Yes [provider]  omeprazole (PRILOSEC) 40 MG capsule Take 1 capsule (40 mg total) by mouth in the morning and at bedtime. 05/06/22  Yes Caidyn Henricksen, Venia Minks, MD  rosuvastatin (CRESTOR) 5 MG  tablet Take 0.5 tablets (2.5 mg total) by mouth every other day. 03/02/18  Yes Shawnee Knapp, MD  RYBELSUS 7 MG TABS Take by mouth every morning. 06/26/22  Yes [provider]  spironolactone (ALDACTONE) 25 MG tablet Take 1 tablet (25 mg total) by mouth 2 (two) times daily. Patient taking differently: Take 25 mg by mouth daily. 03/02/18  Yes Shawnee Knapp, MD  vitamin B-12 (CYANOCOBALAMIN) 1000 MCG tablet Take 1,000 mcg by mouth daily.   Yes [provider]  vitamin E 400 UNIT capsule Take 400 Units by mouth daily.   Yes [provider]  Zinc 100 MG TABS 1 tablet   Yes [provider]  albuterol (PROVENTIL HFA;VENTOLIN HFA) 108 (90 Base) MCG/ACT inhaler INHALE 2 PUFFS Q 4 TO 6 H PRN Patient not taking: Reported on 07/01/2022 07/30/17   [provider]  AMBULATORY NON FORMULARY MEDICATION Medication Name: Diltiazem gel 2% use small pea sized amount three times a day per rectum Patient not taking: Reported on 07/01/2022 05/06/22   Mauri Pole, MD  blood glucose meter kit and supplies KIT Dispense based on patient and insurance preference. Use up to four times daily as directed. (FOR ICD-9 250.00, 250.01). 08/26/18   Corum, Rex Kras, MD  celecoxib (CELEBREX) 100 MG capsule Take 100 mg by mouth as needed.  02/28/20   [provider]  dicyclomine (BENTYL) 20 MG tablet Take 1 tablet (20 mg total) by mouth 3 (three) times daily before meals. Patient taking differently: Take 20 mg by mouth as needed. 03/26/21   Mauri Pole, MD  diltiazem 2 % GEL apply small pea-sized amount per rectum 3 times daily for 2 months Patient not taking: Reported on 05/06/2022 09/05/21   Mauri Pole, MD  EPINEPHrine (EPI-PEN) 0.3 mg/0.3 mL DEVI Inject 0.3 mg into the muscle once as needed. For severe allergic reaction    [provider]  FLOVENT HFA 110 MCG/ACT inhaler Inhale 2 puffs into the lungs 2 (two) times daily. 03/22/21   [provider]   furosemide (LASIX) 20 MG tablet Take 20 mg by mouth daily as needed for edema. 10/02/21   [provider]  glucose blood test strip USE TO TEST 4 TIMES A DAY 08/26/18   [provider]  hydrocortisone (ANUSOL-HC) 25 MG suppository Place 1 suppository (25 mg total) rectally at bedtime. Patient not taking: Reported on 05/06/2022 09/11/21   Zehr, Janett Billow D, PA-C  Lancets (ONETOUCH DELICA PLUS 123XX123) MISC USE TO TEST UP TO 4 TIMES A DAY 08/26/18   [provider]  linaclotide (LINZESS) 145 MCG CAPS capsule Take 1 capsule 30 minutes after breakfast 05/06/22   Takai Chiaramonte, Venia Minks, MD  montelukast (SINGULAIR) 10 MG tablet 1 tablet in the evening    [provider]  Multiple Vitamins-Minerals (ALIVE WOMENS 50+) TABS See admin instructions.    [provider]  Multiple Vitamins-Minerals (HAIR VITAMINS PO) Take by mouth. Vivastol    [provider]  RYBELSUS 3 MG TABS Take 1 tablet by mouth every morning. Patient not taking: Reported on 07/01/2022 01/26/21   [provider]  Simethicone 80 MG TABS Take 1 tablet (80 mg total) by mouth 3 (three) times daily. Patient not taking: Reported on 07/15/2022 03/26/21   Mauri Pole, MD    Current Outpatient Medications  Medication Sig Dispense Refill   amLODipine-olmesartan (AZOR) 5-40 MG tablet Take 1 tablet by mouth daily.     ARNUITY ELLIPTA 200 MCG/ACT AEPB 1 puff Inhalation Once a day for 30 days     azelastine (ASTELIN) 0.1 % nasal spray U 1 TO 2 SPRAYS IEN BID  6   Cholecalciferol (D3-1000 PO) Take 1,000 mg by mouth daily.      omeprazole (PRILOSEC) 40 MG capsule Take 1 capsule (40 mg total) by mouth in the morning and at bedtime. 180 capsule 1   rosuvastatin (CRESTOR) 5 MG tablet Take 0.5 tablets (2.5 mg total) by mouth every other day. 45 tablet 1   RYBELSUS 7 MG TABS Take by mouth every morning.     spironolactone (ALDACTONE) 25 MG tablet Take 1 tablet (25 mg total) by mouth 2 (two) times  daily. (Patient taking differently: Take 25 mg by mouth daily.) 180 tablet 1   vitamin B-12 (CYANOCOBALAMIN) 1000 MCG tablet Take 1,000 mcg by mouth daily.     vitamin E 400 UNIT capsule Take 400 Units by mouth daily.     Zinc 100 MG TABS 1 tablet     albuterol (PROVENTIL HFA;VENTOLIN HFA) 108 (90 Base) MCG/ACT inhaler INHALE 2 PUFFS Q 4 TO 6 H PRN (Patient not taking: Reported on 07/01/2022)  0   AMBULATORY NON FORMULARY MEDICATION Medication Name: Diltiazem gel 2% use small pea sized amount three times a day per rectum (Patient not taking: Reported on 07/01/2022) 30 g  1   blood glucose meter kit and supplies KIT Dispense based on patient and insurance preference. Use up to four times daily as directed. (FOR ICD-9 250.00, 250.01). 1 each 0   celecoxib (CELEBREX) 100 MG capsule Take 100 mg by mouth as needed.     dicyclomine (BENTYL) 20 MG tablet Take 1 tablet (20 mg total) by mouth 3 (three) times daily before meals. (Patient taking differently: Take 20 mg by mouth as needed.) 90 tablet 2   diltiazem 2 % GEL apply small pea-sized amount per rectum 3 times daily for 2 months (Patient not taking: Reported on 05/06/2022) 30 g 2   EPINEPHrine (EPI-PEN) 0.3 mg/0.3 mL DEVI Inject 0.3 mg into the muscle once as needed. For severe allergic reaction     FLOVENT HFA 110 MCG/ACT inhaler Inhale 2 puffs into the lungs 2 (two) times daily.     furosemide (LASIX) 20 MG tablet Take 20 mg by mouth daily as needed for edema.     glucose blood test strip USE TO TEST 4 TIMES A DAY     hydrocortisone (ANUSOL-HC) 25 MG suppository Place 1 suppository (25 mg total) rectally at bedtime. (Patient not taking: Reported on 05/06/2022) 7 suppository 1   Lancets (ONETOUCH DELICA PLUS 123XX123) MISC USE TO TEST UP TO 4 TIMES A DAY     linaclotide (LINZESS) 145 MCG CAPS capsule Take 1 capsule 30 minutes after breakfast 30 capsule 3   montelukast (SINGULAIR) 10 MG tablet 1 tablet in the evening     Multiple Vitamins-Minerals (ALIVE  WOMENS 50+) TABS See admin instructions.     Multiple Vitamins-Minerals (HAIR VITAMINS PO) Take by mouth. Vivastol     RYBELSUS 3 MG TABS Take 1 tablet by mouth every morning. (Patient not taking: Reported on 07/01/2022)     Simethicone 80 MG TABS Take 1 tablet (80 mg total) by mouth 3 (three) times daily. (Patient not taking: Reported on 07/15/2022) 90 tablet 3   Current Facility-Administered Medications  Medication Dose Route Frequency Provider Last Rate Last Admin   0.9 %  sodium chloride infusion  500 mL Intravenous Once Mauri Pole, MD        Allergies as of 07/15/2022 - Review Complete 07/15/2022  Allergen Reaction Noted   Ace inhibitors Anaphylaxis 03/03/2008   Shellfish allergy Anaphylaxis and Other (See Comments) 05/25/2011   Januvia [sitagliptin] Other (See Comments) 10/05/2012   Latex Itching    Lipitor [atorvastatin]  02/10/2013   Shellfish-derived products  01/11/2022    Family History  Problem Relation Age of Onset   Diabetes Mother    Heart disease Mother    Arthritis Mother    Hyperlipidemia Mother    Hypertension Mother    Parkinson's disease Mother    Thyroid disease Mother    Osteoarthritis Mother    Prostate cancer Father    Diabetes Father    Hypertension Father    Rheum arthritis Father    Arthritis Sister    Hyperlipidemia Maternal Grandmother    Hypertension Maternal Grandmother    Hyperlipidemia Maternal Grandfather    Hypertension Maternal Grandfather    Diabetes Paternal Grandmother    Arthritis Paternal Grandmother    Hyperlipidemia Paternal Grandmother    Heart disease Paternal Grandmother    Dementia Paternal Grandmother    Hypertension Paternal Grandmother    Hyperlipidemia Paternal Grandfather    Hypertension Paternal Grandfather    Diabetes Paternal Grandfather    Cholelithiasis Paternal Grandfather    Sickle cell trait Maternal  Uncle    Colon cancer Neg Hx    Esophageal cancer Neg Hx    Stomach cancer Neg Hx    Rectal cancer  Neg Hx     Social History   Socioeconomic History   Marital status: Married    Spouse name: Not on file   Number of children: 0   Years of education: Not on file   Highest education level: Not on file  Occupational History   Occupation: [employer]  Tobacco Use   Smoking status: Never    Passive exposure: Past   Smokeless tobacco: Never  Vaping Use   Vaping Use: Never used  Substance and Sexual Activity   Alcohol use: No   Drug use: No   Sexual activity: Not on file  Other Topics Concern   Not on file  Social History Narrative   Not on file   Social Determinants of Health   Financial Resource Strain: Not on file  Food Insecurity: Not on file  Transportation Needs: Not on file  Physical Activity: Not on file  Stress: Not on file  Social Connections: Not on file  Intimate Partner Violence: Not on file    Review of Systems:  All other review of systems negative except as mentioned in the HPI.  Physical Exam: Vital signs in last 24 hours: Blood Pressure (Abnormal) 156/100   Pulse 84   Temperature (Abnormal) 97.3 F (36.3 C)   Height 5\' 10"  (1.778 m)   Weight 264 lb (119.7 kg)   Last Menstrual Period 01/13/2018   Oxygen Saturation 98%   Body Mass Index 37.88 kg/m  General:   Alert, NAD Lungs:  Clear .   Heart:  Regular rate and rhythm Abdomen:  Soft, nontender and nondistended. Neuro/Psych:  Alert and cooperative. Normal mood and affect. A and O x 3  Reviewed labs, radiology imaging, old records and pertinent past GI work up  Patient is appropriate for planned procedure(s) and anesthesia in an ambulatory setting   K. Denzil Magnuson , MD [phone]

## 2022-07-15 NOTE — Progress Notes (Unsigned)
Vss nad trans to pacu 

## 2022-07-15 NOTE — Progress Notes (Unsigned)
Called to room to assist during endoscopic procedure.  Patient ID and intended procedure confirmed with present staff. Received instructions for my participation in the procedure from the performing physician.  

## 2022-07-15 NOTE — Telephone Encounter (Signed)
PA received via CMM for Sucralfate 1GM/10ML suspension  PA not submitted due to question of tablets being appropriate.   Key: Maria Sharp

## 2022-07-15 NOTE — Patient Instructions (Addendum)
Resume previous diet Continue present medications No NSAIDS (Non-Steroidal anti-inflammatory drugs) for 2 weeks.  (These include, aspirin, aspirin-containing products, ibuprofen, advil, motrin, naproxen, aleve, goody powders, etc) Tylenol is ok to take as needed, see label for instructions. Pick up new Rx for Carafate, take twice daily for 2 weeks Handouts/information given for hiatal hernia, gastric polyps and gastritis YOU HAD AN ENDOSCOPIC PROCEDURE TODAY AT [facility]:   Refer to the procedure report that was given to you for any specific questions about what was found during the examination.  If the procedure report does not answer your questions, please call your gastroenterologist to clarify.  If you requested that your care partner not be given the details of your procedure findings, then the procedure report has been included in a sealed envelope for you to review at your convenience later.  YOU SHOULD EXPECT: Some feelings of bloating in the abdomen. Passage of more gas than usual.  Walking can help get rid of the air that was put into your GI tract during the procedure and reduce the bloating. If you had a lower endoscopy (such as a colonoscopy or flexible sigmoidoscopy) you may notice spotting of blood in your stool or on the toilet paper. If you underwent a bowel prep for your procedure, you may not have a normal bowel movement for a few days.  Please Note:  You might notice some irritation and congestion in your nose or some drainage.  This is from the oxygen used during your procedure.  There is no need for concern and it should clear up in a day or so.  SYMPTOMS TO REPORT IMMEDIATELY: Following upper endoscopy (EGD)  Vomiting of blood or coffee ground material  New chest pain or pain under the shoulder blades  Painful or persistently difficult swallowing  New shortness of breath  Fever of 100F or higher  Black, tarry-looking stools  For urgent or emergent  issues, a gastroenterologist can be reached at any hour by calling [phone]. Do not use MyChart messaging for urgent concerns.   DIET:  We do recommend a small meal at first, but then you may proceed to your regular diet.  Drink plenty of fluids but you should avoid alcoholic beverages for 24 hours.  ACTIVITY:  You should plan to take it easy for the rest of today and you should NOT DRIVE or use heavy machinery until tomorrow (because of the sedation medicines used during the test).    FOLLOW UP: Our staff will call the number listed on your records the next business day following your procedure.  We will call around 7:15- 8:00 am to check on you and address any questions or concerns that you may have regarding the information given to you following your procedure. If we do not reach you, we will leave a message.     If any biopsies were taken you will be contacted by phone or by letter within the next 1-3 weeks.  Please call us at [phone] if you have not heard about the biopsies in 3 weeks.    SIGNATURES/CONFIDENTIALITY: You and/or your care partner have signed paperwork which will be entered into your electronic medical record.  These signatures attest to the fact that that the information above on your After Visit Summary has been reviewed and is understood.  Full responsibility of the confidentiality of this discharge information lies with you and/or your care-partner.

## 2022-07-15 NOTE — Op Note (Signed)
[facility] Patient Name: Maria Sharp Procedure Date: 07/15/2022 1:26 PM MRN: [id number] Endoscopist: Mauri Pole , MD, [id number] Age: 57 Referring MD:  Date of Birth: [birth year] Gender: Female Account #: [id number] Procedure:                Upper GI endoscopy Indications:              Esophageal reflux symptoms that persist despite                            appropriate therapy Medicines:                Monitored Anesthesia Care Procedure:                Pre-Anesthesia Assessment:                           - Prior to the procedure, a History and Physical                            was performed, and patient medications and                            allergies were reviewed. The patient's tolerance of                            previous anesthesia was also reviewed. The risks                            and benefits of the procedure and the sedation                            options and risks were discussed with the patient.                            All questions were answered, and informed consent                            was obtained. Prior Anticoagulants: The patient has                            taken no anticoagulant or antiplatelet agents. ASA                            Grade Assessment: III - A patient with severe                            systemic disease. After reviewing the risks and                            benefits, the patient was deemed in satisfactory                            condition to undergo the procedure.  After obtaining informed consent, the endoscope was                            passed under direct vision. Throughout the                            procedure, the patient's blood pressure, pulse, and                            oxygen saturations were monitored continuously. The                            GIF [id number] was introduced through the                            mouth, and advanced to the  second part of duodenum.                            The upper GI endoscopy was accomplished without                            difficulty. The patient tolerated the procedure                            well. Scope In: Scope Out: Findings:                 The Z-line was regular and was found 40 cm from the                            incisors.                           A 2 cm hiatal hernia was present.                           Two 6 to 8 mm semi-pedunculated polyps with                            bleeding and stigmata of recent bleeding were found                            in the gastric antrum. The polyp was removed with a                            hot snare. Resection and retrieval were complete.                           Multiple 5 to 8 mm pedunculated and sessile polyps                            with no bleeding and no stigmata of recent bleeding  were found in the entire examined stomach. Biopsies                            were taken with a cold forceps for histology.                           Patchy mild inflammation characterized by                            congestion (edema) and erythema was found in the                            entire examined stomach. Biopsies were taken with a                            cold forceps for Helicobacter pylori testing.                           The examined duodenum was normal. Complications:            No immediate complications. Estimated Blood Loss:     Estimated blood loss was minimal. Impression:               - Z-line regular, 40 cm from the incisors.                           - 2 cm hiatal hernia.                           - Two gastric polyps. Resected and retrieved.                           - Multiple gastric polyps. Biopsied.                           - Gastritis. Biopsied.                           - Normal examined duodenum. Recommendation:           - Patient has a contact number available for                             emergencies. The signs and symptoms of potential                            delayed complications were discussed with the                            patient. Return to normal activities tomorrow.                            Written discharge instructions were provided to the                            patient.                           -  Resume previous diet.                           - Continue present medications.                           - Await pathology results.                           - No ibuprofen, naproxen, or other non-steroidal                            anti-inflammatory drugs for 2 weeks.                           - Use sucralfate suspension 1 gram PO BID for 2                            weeks.                           - Use Prilosec (omeprazole) 40 mg PO BID. Mauri Pole, MD 07/15/2022 1:51:04 PM This report has been signed electronically.

## 2022-07-16 ENCOUNTER — Telehealth: Payer: Self-pay | Admitting: *Deleted

## 2022-07-16 ENCOUNTER — Other Ambulatory Visit: Payer: Self-pay

## 2022-07-16 MED ORDER — SUCRALFATE 1 G PO TABS: ORAL_TABLET | ORAL | 0 refills | Status: DC

## 2022-07-16 NOTE — Telephone Encounter (Signed)
  Follow up Call-     07/15/2022   12:50 PM  Call back number  Post procedure Call Back phone  # [phone]  Permission to leave phone message Yes     Patient questions:  Do you have a fever, pain , or abdominal swelling? No. Pain Score  0 *  Have you tolerated food without any problems? Yes.    Have you been able to return to your normal activities? Yes.    Do you have any questions about your discharge instructions: Diet   No. Medications  No. Follow up visit  No.  Do you have questions or concerns about your Care? No.  Actions: * If pain score is 4 or above: No action needed, pain <4.

## 2022-07-16 NOTE — Telephone Encounter (Signed)
Carafate suspension prescription canceled. New prescription for tablets sent to the pharmacy. Patient notified.

## 2022-07-18 ENCOUNTER — Telehealth: Payer: Self-pay | Admitting: *Deleted

## 2022-07-18 NOTE — Telephone Encounter (Signed)
Patient called stating she needed more information on her FLMA paperwork. She told the [facility] that she would be here Monday to get them completed. After 5pm now, I contacted the patient back, I explained to her that I have a copy in front of me and to tell me what was missed and we could take care of it now before the holiday and I could refax them today. She said she needed to get a copy from her PCP of the last FLMA papers to know what she needs. She is going to call their office. I told her I would call her back in 10 minutes due to our phone lines being closed now    I called patient back, She said her PCP office was closed and she would be in Monday to see me after 10 am. I will get with her and we will work on them together to insure the FLMA paperwork is filled out properly

## 2022-07-22 NOTE — Telephone Encounter (Signed)
Patient came into the office this morning to work on [facility] paperwork, I spent an hour with the patient during lunch hour.   I completed forms and Colletta Maryland faxed in and we gave the patient a copy and scanned the other copy into Epic

## 2022-07-25 NOTE — Progress Notes (Signed)
Office Note     CC:  Superficial venous thrombus Requesting Provider:  Mila Palmer, MD  HPI: Maria Sharp is a 57 y.o. (16-Jan-1966) female who presents at the request of Mila Palmer, MD for evaluation of right lower extremity superficial venous thrombosis.  On exam today, Maria Sharp was doing well.  Native of [place], she moved to [place] years ago.  Both her and her sister continue to make trips to [place] in an effort to take care of her father, who is in his 71s.  Nicol has noted right lower extremity pain for several months.  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  The right lower extremity pain only occurs Monday through Friday and is most appreciated while using the forklift.  Pain is down the right leg, and usually ends in the lateral aspect of the calf.  Over the last few years, Maria Sharp has had a 70 pound weight gain due to menopause.  She notes that she was at one point down to 200 pounds, however this took significant discipline limiting several things in her diet.  She denies symptoms of claudication, ischemic rest pain, tissue loss. Compressions in the past which have been helpful, but have not alleviated the pain which radiates down the leg to the lateral aspect of the calf.  Prior imaging demonstrated concern for superficial venous thrombosis in the lateral aspect of the calf, specifically in the varicose veins.  Previous history of right greater saphenous vein ablation phlebectomy in 2019.  Past Medical History:  Diagnosis Date   Allergy    Asthma    Diabetes mellitus    Fatty liver    Gastritis    Gastroparesis    study 11 14  nl abd [place]   GERD (gastroesophageal reflux disease)    Gout    High cholesterol    History of varicella    Hypertension    IBS (irritable bowel syndrome)     Past Surgical History:  Procedure Laterality Date   ANAL RECTAL MANOMETRY N/A 04/03/2015   Procedure: ANO  RECTAL MANOMETRY;  Surgeon: Napoleon Form, MD;  Location: [facility] ENDOSCOPY;  Service: Endoscopy;  Laterality: N/A;   CHOLECYSTECTOMY     EYE SURGERY     REFRACTIVE SURGERY Right    WISDOM TOOTH EXTRACTION      Social History   Socioeconomic History   Marital status: Married    Spouse name: Not on file   Number of children: 0   Years of education: Not on file   Highest education level: Not on file  Occupational History   Occupation: Post office  Tobacco Use   Smoking status: Never    Passive exposure: Past   Smokeless tobacco: Never  Vaping Use   Vaping Use: Never used  Substance and Sexual Activity   Alcohol use: No   Drug use: No   Sexual activity: Not on file  Other Topics Concern   Not on file  Social History Narrative   Not on file   Social Determinants of Health   Financial Resource Strain: Not on file  Food Insecurity: Not on file  Transportation Needs: Not on file  Physical Activity: Not on file  Stress: Not on file  Social Connections: Not on file  Intimate Partner Violence: Not on file   Family History  Problem Relation Age of Onset   Diabetes Mother    Heart disease Mother  Arthritis Mother    Hyperlipidemia Mother    Hypertension Mother    Parkinson's disease Mother    Thyroid disease Mother    Osteoarthritis Mother    Prostate cancer Father    Diabetes Father    Hypertension Father    Rheum arthritis Father    Arthritis Sister    Hyperlipidemia Maternal Grandmother    Hypertension Maternal Grandmother    Hyperlipidemia Maternal Grandfather    Hypertension Maternal Grandfather    Diabetes Paternal Grandmother    Arthritis Paternal Grandmother    Hyperlipidemia Paternal Grandmother    Heart disease Paternal Grandmother    Dementia Paternal Grandmother    Hypertension Paternal Grandmother    Hyperlipidemia Paternal Grandfather    Hypertension Paternal Grandfather    Diabetes Paternal Grandfather    Cholelithiasis Paternal Grandfather     Sickle cell trait Maternal Uncle    Colon cancer Neg Hx    Esophageal cancer Neg Hx    Stomach cancer Neg Hx    Rectal cancer Neg Hx     Current Outpatient Medications  Medication Sig Dispense Refill   sucralfate (CARAFATE) 1 g tablet 1 tablet crushed and added to 30 ml warm water to make a slurry twice daily by mouth for 14 days 28 tablet 0   albuterol (PROVENTIL HFA;VENTOLIN HFA) 108 (90 Base) MCG/ACT inhaler INHALE 2 PUFFS Q 4 TO 6 H PRN (Patient not taking: Reported on 07/01/2022)  0   AMBULATORY NON FORMULARY MEDICATION Medication Name: Diltiazem gel 2% use small pea sized amount three times a day per rectum (Patient not taking: Reported on 07/01/2022) 30 g 1   amLODipine-olmesartan (AZOR) 5-40 MG tablet Take 1 tablet by mouth daily.     ARNUITY ELLIPTA 200 MCG/ACT AEPB 1 puff Inhalation Once a day for 30 days     azelastine (ASTELIN) 0.1 % nasal spray U 1 TO 2 SPRAYS IEN BID  6   blood glucose meter kit and supplies KIT Dispense based on patient and insurance preference. Use up to four times daily as directed. (FOR ICD-9 250.00, 250.01). 1 each 0   celecoxib (CELEBREX) 100 MG capsule Take 100 mg by mouth as needed.     Cholecalciferol (D3-1000 PO) Take 1,000 mg by mouth daily.      dicyclomine (BENTYL) 20 MG tablet Take 1 tablet (20 mg total) by mouth 3 (three) times daily before meals. (Patient taking differently: Take 20 mg by mouth as needed.) 90 tablet 2   diltiazem 2 % GEL apply small pea-sized amount per rectum 3 times daily for 2 months (Patient not taking: Reported on 05/06/2022) 30 g 2   EPINEPHrine (EPI-PEN) 0.3 mg/0.3 mL DEVI Inject 0.3 mg into the muscle once as needed. For severe allergic reaction     FLOVENT HFA 110 MCG/ACT inhaler Inhale 2 puffs into the lungs 2 (two) times daily.     furosemide (LASIX) 20 MG tablet Take 20 mg by mouth daily as needed for edema.     glucose blood test strip USE TO TEST 4 TIMES A DAY     hydrocortisone (ANUSOL-HC) 25 MG suppository Place 1  suppository (25 mg total) rectally at bedtime. (Patient not taking: Reported on 05/06/2022) 7 suppository 1   Lancets (ONETOUCH DELICA PLUS LANCET33G) MISC USE TO TEST UP TO 4 TIMES A DAY     linaclotide (LINZESS) 145 MCG CAPS capsule Take 1 capsule 30 minutes after breakfast 30 capsule 3   montelukast (SINGULAIR) 10 MG tablet 1 tablet  in the evening     Multiple Vitamins-Minerals (ALIVE WOMENS 50+) TABS See admin instructions.     Multiple Vitamins-Minerals (HAIR VITAMINS PO) Take by mouth. Vivastol     omeprazole (PRILOSEC) 40 MG capsule Take 1 capsule (40 mg total) by mouth in the morning and at bedtime. 180 capsule 1   rosuvastatin (CRESTOR) 5 MG tablet Take 0.5 tablets (2.5 mg total) by mouth every other day. 45 tablet 1   RYBELSUS 3 MG TABS Take 1 tablet by mouth every morning. (Patient not taking: Reported on 07/01/2022)     RYBELSUS 7 MG TABS Take by mouth every morning.     Simethicone 80 MG TABS Take 1 tablet (80 mg total) by mouth 3 (three) times daily. (Patient not taking: Reported on 07/15/2022) 90 tablet 3   spironolactone (ALDACTONE) 25 MG tablet Take 1 tablet (25 mg total) by mouth 2 (two) times daily. (Patient taking differently: Take 25 mg by mouth daily.) 180 tablet 1   vitamin B-12 (CYANOCOBALAMIN) 1000 MCG tablet Take 1,000 mcg by mouth daily.     vitamin E 400 UNIT capsule Take 400 Units by mouth daily.     Zinc 100 MG TABS 1 tablet     No current facility-administered medications for this visit.    Allergies  Allergen Reactions   Ace Inhibitors Anaphylaxis    Other reaction(s): Not available   Shellfish Allergy Anaphylaxis and Other (See Comments)    Only shrimp   Januvia [Sitagliptin] Other (See Comments)    Recurrent abdominal pain with elevated pancreas enzymes   Latex Itching   Lipitor [Atorvastatin]    Shellfish-Derived Products     Other reaction(s): Not available     REVIEW OF SYSTEMS:   [X]  denotes positive finding, [ ]  denotes negative finding Cardiac   Comments:  Chest pain or chest pressure:    Shortness of breath upon exertion:    Short of breath when lying flat:    Irregular heart rhythm:        Vascular    Pain in calf, thigh, or hip brought on by ambulation:    Pain in feet at night that wakes you up from your sleep:     Blood clot in your veins:    Leg swelling:         Pulmonary    Oxygen at home:    Productive cough:     Wheezing:         Neurologic    Sudden weakness in arms or legs:     Sudden numbness in arms or legs:     Sudden onset of difficulty speaking or slurred speech:    Temporary loss of vision in one eye:     Problems with dizziness:         Gastrointestinal    Blood in stool:     Vomited blood:         Genitourinary    Burning when urinating:     Blood in urine:        Psychiatric    Major depression:         Hematologic    Bleeding problems:    Problems with blood clotting too easily:        Skin    Rashes or ulcers:        Constitutional    Fever or chills:      PHYSICAL EXAMINATION:  There were no vitals filed for this visit.  General:  WDWN in  NAD; vital signs documented above Gait: Not observed HENT: WNL, normocephalic Pulmonary: normal non-labored breathing Cardiac: regular HR, Abdomen: soft, NT, no masses Skin: without rashes Vascular Exam/Pulses:  Right Left  Radial 2+ (normal) 2+ (normal)  Ulnar    Femoral    Popliteal    DP 2+ (normal) 2+ (normal)  PT     Extremities: without ischemic changes, without Gangrene , without cellulitis; without open wounds;  Musculoskeletal: no muscle wasting or atrophy  Neurologic: A&O X 3;  No focal weakness or paresthesias are detected Psychiatric:  The pt has Normal affect.   Non-Invasive Vascular Imaging:   Venous Reflux Times  +--------------+--------+------+----------+------------+-------------------  ----+  RIGHT        Reflux  Reflux  Reflux  Diameter cmsComments                                No       Yes      Time                                         +--------------+--------+------+----------+------------+-------------------  ----+  CFV                   yes  >1 second                                       +--------------+--------+------+----------+------------+-------------------  ----+  FV mid        no                                                            +--------------+--------+------+----------+------------+-------------------  ----+  Popliteal    no                                                            +--------------+--------+------+----------+------------+-------------------  ----+  GSV at [facility]             yes   >500 ms      0.60                              +--------------+--------+------+----------+------------+-------------------  ----+  GSV prox thigh         yes   >500 ms      0.56    prior                                                                       ablation/stripping        +--------------+--------+------+----------+------------+-------------------  ----+  GSV mid thigh  prior                                                                       ablation/stripping        +--------------+--------+------+----------+------------+-------------------  ----+  GSV dist thighno                          0.27    prior                                                                       ablation/stripping        +--------------+--------+------+----------+------------+-------------------  ----+  GSV at knee                                       prior                                                                       ablation/stripping        +--------------+--------+------+----------+------------+-------------------  ----+  GSV prox calf          yes   >500 ms      0.37                               +--------------+--------+------+----------+------------+-------------------  ----+  SSV Pop Fossa no                          0.27                              +--------------+--------+------+----------+------------+-------------------  ----+  SSV prox calf no                          0.33                              +--------------+--------+------+----------+------------+-------------------  ----+  SSV mid calf  no                          0.32                              +--------------+--------+------+----------+------------+-------------------  ----+  AASV o        no  0.50                              +--------------+--------+------+----------+------------+-------------------  ----+  AASV P        no                          0.45                              +--------------+--------+------+----------+------------+-------------------  ----+      ASSESSMENT/PLAN:: 57 y.o. female presenting with pain in the right lower extremity, specifically when riding the forklift at work, described as radiating pain involving the lateral aspect of the right calf.  On physical exam, Aurther Lofterry had some small varicosities appreciated in the popliteal fossa.  New venous duplex ultrasound demonstrated some recanalization at the proximal greater saphenous vein, however the majority of the greater saphenous vein remained occluded from previous ablation.  This study also demonstrated no thrombus within the varicosities appreciated on the lateral aspect of the calf and no SSV insufficiency.  The pain she describes is not consistent with venous insufficiency, and is more consistent with musculoskeletal etiologies.  Of note, she noted that she has a hip that may need to be replaced.  We discussed the importance of wearing compression stockings as well as the importance of taking breaks at work.  Furthermore, we discussed the importance of weight  loss, as her recent weight gain coincides with her right lower extremity pain as well.  Follow-up with my office as needed.  I am happy to see her should any questions or concerns arise.  Victorino SparrowJoshua E Jeiden Daughtridge, MD [facility] [phone] Total time of patient care including pre-visit research, consultation, and documentation greater than 45 minutes

## 2022-07-26 ENCOUNTER — Ambulatory Visit (HOSPITAL_COMMUNITY)
Admission: RE | Admit: 2022-07-26 | Discharge: 2022-07-26 | Disposition: A | Discharge status: Home / Self Care | Payer: 59 | Source: Ambulatory Visit | Attending: Vascular Surgery | Admitting: Vascular Surgery

## 2022-07-26 ENCOUNTER — Encounter: Payer: Self-pay | Admitting: Vascular Surgery

## 2022-07-26 ENCOUNTER — Ambulatory Visit: Payer: 59 | Admitting: Vascular Surgery

## 2022-07-26 VITALS — BP 160/105 | HR 82 | Temp 98.2°F | Resp 20 | Ht 70.0 in | Wt 266.0 lb

## 2022-07-26 DIAGNOSIS — M79605 Pain in left leg: Secondary | ICD-10-CM | POA: Insufficient documentation

## 2022-07-26 DIAGNOSIS — I872 Venous insufficiency (chronic) (peripheral): Secondary | ICD-10-CM | POA: Diagnosis not present

## 2022-07-26 DIAGNOSIS — M79604 Pain in right leg: Secondary | ICD-10-CM

## 2022-07-26 DIAGNOSIS — I8393 Asymptomatic varicose veins of bilateral lower extremities: Secondary | ICD-10-CM

## 2022-08-01 ENCOUNTER — Encounter: Payer: Self-pay | Admitting: Gastroenterology

## 2022-08-05 ENCOUNTER — Ambulatory Visit: Payer: 59 | Admitting: Podiatry

## 2022-08-05 ENCOUNTER — Encounter: Payer: Self-pay | Admitting: Podiatry

## 2022-08-05 VITALS — BP 151/85

## 2022-08-05 DIAGNOSIS — B351 Tinea unguium: Secondary | ICD-10-CM

## 2022-08-05 DIAGNOSIS — E1151 Type 2 diabetes mellitus with diabetic peripheral angiopathy without gangrene: Secondary | ICD-10-CM | POA: Diagnosis not present

## 2022-08-05 DIAGNOSIS — M79609 Pain in unspecified limb: Secondary | ICD-10-CM

## 2022-08-05 NOTE — Progress Notes (Unsigned)
  Subjective:  Patient ID: Maria Sharp, female    DOB: 1965-09-07,  MRN: [id number]  Maria Sharp presents to clinic today for {jgcomplaint:23593}  Chief Complaint  Patient presents with   Nail Problem    [facility] BS-did not check today A1C-6.5 PCP- Paulino Rily PCP VST- 3 Weeks ago   New problem(s): None. {jgcomplaint:23593}  PCP is Mila Palmer, MD.  Allergies  Allergen Reactions   Ace Inhibitors Anaphylaxis    Other reaction(s): Not available   Shellfish Allergy Anaphylaxis and Other (See Comments)    Only shrimp   Januvia [Sitagliptin] Other (See Comments)    Recurrent abdominal pain with elevated pancreas enzymes   Latex Itching   Lipitor [Atorvastatin]    Shellfish-Derived Products     Other reaction(s): Not available    Review of Systems: Negative except as noted in the HPI.  Objective: No changes noted in today's physical examination. Vitals:   08/05/22 0957  BP: (!) 151/85   Maria Sharp is a pleasant 57 y.o. female {jgbodyhabitus:24098} AAO x 3.  Vascular Examination: CFT <3 seconds b/l LE. Palpable DP pulse(s) b/l LE. Faintly palpable PT pulse(s) b/l LE. Pedal hair sparse. No pain with calf compression b/l. Lower extremity skin temperature gradient within normal limits. Trace edema noted bilateral ankles. Varicosities present b/l.  Dermatological Examination: Pedal integument with normal turgor, texture and tone b/l LE. No open wounds b/l. No interdigital macerations b/l. Toenails 1-5 b/l elongated, thickened, discolored with subungual debris. +Tenderness with dorsal palpation of nailplates. No hyperkeratotic or porokeratotic lesions present.  Neurological Examination: Protective sensation intact 5/5 intact bilaterally with 10g monofilament b/l. Vibratory sensation intact b/l.  Musculoskeletal Examination: Normal muscle strength 5/5 to all lower extremity muscle groups bilaterally. No pain, crepitus or joint limitation noted with ROM bilateral LE.  Hammertoe(s) noted to the L 5th toe. Pes planus deformity noted bilateral LE.Marland Kitchen No pain, crepitus or joint limitation noted with ROM b/l LE.  Patient ambulates independently without assistive aids.  Assessment/Plan: 1. Pain due to onychomycosis of nail   2. Type II diabetes mellitus with peripheral circulatory disorder     No orders of the defined types were placed in this encounter.   None {Jgplan:23602::"-Patient/POA to call should there be question/concern in the interim."}   No follow-ups on file.  Freddie Breech, DPM

## 2022-10-14 ENCOUNTER — Ambulatory Visit: Payer: 59 | Admitting: Podiatry

## 2022-10-14 ENCOUNTER — Encounter: Payer: Self-pay | Admitting: Podiatry

## 2022-10-14 VITALS — BP 137/86 | HR 76

## 2022-10-14 DIAGNOSIS — B351 Tinea unguium: Secondary | ICD-10-CM | POA: Diagnosis not present

## 2022-10-14 DIAGNOSIS — M79609 Pain in unspecified limb: Secondary | ICD-10-CM | POA: Diagnosis not present

## 2022-10-14 DIAGNOSIS — E1151 Type 2 diabetes mellitus with diabetic peripheral angiopathy without gangrene: Secondary | ICD-10-CM | POA: Diagnosis not present

## 2022-10-14 DIAGNOSIS — M2141 Flat foot [pes planus] (acquired), right foot: Secondary | ICD-10-CM | POA: Diagnosis not present

## 2022-10-14 DIAGNOSIS — M2042 Other hammer toe(s) (acquired), left foot: Secondary | ICD-10-CM

## 2022-10-14 DIAGNOSIS — M2142 Flat foot [pes planus] (acquired), left foot: Secondary | ICD-10-CM

## 2022-10-14 DIAGNOSIS — E119 Type 2 diabetes mellitus without complications: Secondary | ICD-10-CM | POA: Diagnosis not present

## 2022-10-14 NOTE — Progress Notes (Unsigned)
ANNUAL DIABETIC FOOT EXAM  Subjective: Maria Sharp presents today annual diabetic foot exam.  Chief Complaint  Patient presents with   Nail Problem    "Do the trim and the Diabetic stuff." Dr. Mila Palmer - will see her tomorrow, glucose - last reading 2 weeks ago - 142 mg/dl    Patient confirms h/o diabetes.  Patient denies any h/o foot wounds.  Patient has h/o foot/leg ulcer of {jgPodToeLocator:23637}.  Patient has h/o amputation(s): {jgamp:23617}.  Patient denies any numbness, tingling, burning, or pins/needle sensation in feet.  Patient endorses symptoms of foot numbness.   Patient endorses symptoms of foot tingling.  Patient endorses symptoms of burning in feet.  Patient endorses symptoms of pins/needles sensation in feet.  Patient has been diagnosed with neuropathy.  Risk factors: {jgriskfactors:24044}.  Mila Palmer, MD is patient's PCP.  Past Medical History:  Diagnosis Date   Allergy    Asthma    Diabetes mellitus    Fatty liver    Gastritis    Gastroparesis    study 11 14  nl abd [place]   GERD (gastroesophageal reflux disease)    Gout    High cholesterol    History of varicella    Hypertension    IBS (irritable bowel syndrome)    Patient Active Problem List   Diagnosis Date Noted   Positive ANA (antinuclear antibody) 07/01/2022   Polyarthralgia 07/01/2022   Circadian rhythm sleep disorder, shift work type 10/03/2021   Cyst of tendon sheath 10/03/2021   Hypercalcemia 10/03/2021   Allergic rhinitis 02/01/2021   Allergic rhinitis due to animal (cat) (dog) hair and dander 02/01/2021   Allergic rhinitis due to pollen 02/01/2021   Chronic allergic conjunctivitis 02/01/2021   Mild persistent asthma, uncomplicated 02/01/2021   Seafood allergy 02/01/2021   Exposure to COVID-19 virus 08/26/2018   Migraines 09/02/2017   DJD (degenerative joint disease) 09/02/2017   Vertigo 12/05/2016   Bilateral temporomandibular joint pain 12/05/2016    Constipation    Asthma, chronic 09/28/2014   Gastroparesis    Varicose veins 09/14/2013   Neck pain on left side 06/14/2013   Current non-adherence to medical treatment 06/14/2013   Vitamin D deficiency 10/05/2012   Leg cramps 10/05/2012   Family history of diabetes mellitus 10/05/2012   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   ABDOMINAL PAIN, UNSPECIFIED SITE 05/18/2010   CHEST PAIN, INTERMITTENT 05/12/2009   Obesity 04/05/2009   CARDIOMEGALY 04/05/2009   PALPITATIONS 04/05/2009   Type 2 diabetes mellitus with peripheral circulatory disorder (HCC) 03/15/2009   BACK PAIN 03/09/2008   Hyperlipidemia LDL goal <100 03/03/2008   Essential hypertension 03/03/2008   GERD 03/03/2008   Irritable bowel syndrome with both constipation and diarrhea 03/03/2008   Rectal bleeding 03/03/2008   Past Surgical History:  Procedure Laterality Date   ANAL RECTAL MANOMETRY N/A 04/03/2015   Procedure: ANO RECTAL MANOMETRY;  Surgeon: Napoleon Form, MD;  Location: [facility];  Service: Endoscopy;  Laterality: N/A;   CHOLECYSTECTOMY     EYE SURGERY     REFRACTIVE SURGERY Right    WISDOM TOOTH EXTRACTION     Current Outpatient Medications on File Prior to Visit  Medication Sig Dispense Refill   albuterol (PROVENTIL HFA;VENTOLIN HFA) 108 (90 Base) MCG/ACT inhaler   0   AMBULATORY NON FORMULARY MEDICATION Medication Name: Diltiazem gel 2% use small pea sized amount three times a day per rectum 30 g 1   amLODipine-olmesartan (AZOR) 5-40 MG tablet Take 1 tablet by mouth in the morning and  at bedtime.     ARNUITY ELLIPTA 200 MCG/ACT AEPB 1 puff Inhalation Once a day for 30 days     azelastine (ASTELIN) 0.1 % nasal spray U 1 TO 2 SPRAYS IEN BID  6   blood glucose meter kit and supplies KIT Dispense based on patient and insurance preference. Use up to four times daily as directed. (FOR ICD-9 250.00, 250.01). 1 each 0   celecoxib (CELEBREX) 100 MG capsule Take 100 mg by mouth as needed.     dicyclomine  (BENTYL) 20 MG tablet Take 1 tablet (20 mg total) by mouth 3 (three) times daily before meals. (Patient taking differently: Take 20 mg by mouth as needed.) 90 tablet 2   diltiazem 2 % GEL apply small pea-sized amount per rectum 3 times daily for 2 months 30 g 2   EPINEPHrine (EPI-PEN) 0.3 mg/0.3 mL DEVI Inject 0.3 mg into the muscle once as needed. For severe allergic reaction     FLOVENT HFA 110 MCG/ACT inhaler Inhale 2 puffs into the lungs 2 (two) times daily.     furosemide (LASIX) 20 MG tablet Take 20 mg by mouth daily as needed for edema.     glucose blood test strip USE TO TEST 4 TIMES A DAY     Lancets (ONETOUCH DELICA PLUS LANCET33G) MISC USE TO TEST UP TO 4 TIMES A DAY     linaclotide (LINZESS) 145 MCG CAPS capsule Take 1 capsule 30 minutes after breakfast 30 capsule 3   montelukast (SINGULAIR) 10 MG tablet 1 tablet in the evening     Multiple Vitamins-Minerals (ALIVE WOMENS 50+) TABS See admin instructions.     Multiple Vitamins-Minerals (HAIR VITAMINS PO) Take by mouth. Vivastol     naproxen (NAPROSYN) 500 MG tablet Take 500 mg by mouth 2 (two) times daily with a meal.     omeprazole (PRILOSEC) 40 MG capsule Take 1 capsule (40 mg total) by mouth in the morning and at bedtime. 180 capsule 1   rosuvastatin (CRESTOR) 5 MG tablet Take 0.5 tablets (2.5 mg total) by mouth every other day. 45 tablet 1   RYBELSUS 7 MG TABS Take by mouth every morning.     Simethicone 80 MG TABS Take 1 tablet (80 mg total) by mouth 3 (three) times daily. 90 tablet 3   spironolactone (ALDACTONE) 25 MG tablet Take 1 tablet (25 mg total) by mouth 2 (two) times daily. (Patient taking differently: Take 25 mg by mouth daily.) 180 tablet 1   vitamin B-12 (CYANOCOBALAMIN) 1000 MCG tablet Take 1,000 mcg by mouth daily.     vitamin E 400 UNIT capsule Take 400 Units by mouth daily.     Zinc 100 MG TABS 1 tablet     sucralfate (CARAFATE) 1 g tablet 1 tablet crushed and added to 30 ml warm water to make a slurry twice daily  by mouth for 14 days (Patient not taking: Reported on 10/14/2022) 28 tablet 0   No current facility-administered medications on file prior to visit.    Allergies  Allergen Reactions   Ace Inhibitors Anaphylaxis    Other reaction(s): Not available   Shellfish Allergy Anaphylaxis and Other (See Comments)    Only shrimp   Januvia [Sitagliptin] Other (See Comments)    Recurrent abdominal pain with elevated pancreas enzymes   Latex Itching   Lipitor [Atorvastatin]    Shellfish-Derived Products     Other reaction(s): Not available   Social History   Occupational History   Occupation: Forensic scientist  Tobacco Use   Smoking status: Never    Passive exposure: Past   Smokeless tobacco: Never  Vaping Use   Vaping Use: Never used  Substance and Sexual Activity   Alcohol use: No   Drug use: No   Sexual activity: Not on file   Family History  Problem Relation Age of Onset   Diabetes Mother    Heart disease Mother    Arthritis Mother    Hyperlipidemia Mother    Hypertension Mother    Parkinson's disease Mother    Thyroid disease Mother    Osteoarthritis Mother    Prostate cancer Father    Diabetes Father    Hypertension Father    Rheum arthritis Father    Arthritis Sister    Hyperlipidemia Maternal Grandmother    Hypertension Maternal Grandmother    Hyperlipidemia Maternal Grandfather    Hypertension Maternal Grandfather    Diabetes Paternal Grandmother    Arthritis Paternal Grandmother    Hyperlipidemia Paternal Grandmother    Heart disease Paternal Grandmother    Dementia Paternal Grandmother    Hypertension Paternal Grandmother    Hyperlipidemia Paternal Grandfather    Hypertension Paternal Grandfather    Diabetes Paternal Grandfather    Cholelithiasis Paternal Grandfather    Sickle cell trait Maternal Uncle    Colon cancer Neg Hx    Esophageal cancer Neg Hx    Stomach cancer Neg Hx    Rectal cancer Neg Hx    Immunization History  Administered Date(s) Administered    Influenza, High Dose Seasonal PF 03/16/2018, 01/22/2021   Influenza,inj,Quad PF,6+ Mos 02/08/2013, 02/07/2014, 02/17/2017, 03/02/2018   Influenza-Unspecified 02/07/2014, 01/08/2021   PFIZER(Purple Top)SARS-COV-2 Vaccination 06/28/2019, 07/26/2019, 11/29/2019, 02/24/2020   Pneumococcal Conjugate-13 02/08/2013   Pneumococcal Polysaccharide-23 12/20/2013, 02/14/2016, 01/21/2017, 03/16/2018, 01/22/2021   Pneumococcal-Unspecified 04/22/2004   Tdap 01/01/2012, 06/14/2013     Review of Systems: Negative except as noted in the HPI.   Objective: Vitals:   10/14/22 0902  BP: (!) 148/90  Pulse: 81   Maria Sharp is a pleasant 57 y.o. female in NAD. AAO X 3.  Lab Results  Component Value Date   HGBA1C 7.0 (H) 03/02/2018   ADA Risk Categorization: Low Risk :  Patient has all of the following: Intact protective sensation No prior foot ulcer  No severe deformity Pedal pulses present  High Risk  Patient has one or more of the following: Loss of protective sensation Absent pedal pulses Severe Foot deformity History of foot ulcer  Assessment: 1. Pain due to onychomycosis of nail   2. Pes planus of both feet   3. Hammertoe of left foot   4. Type II diabetes mellitus with peripheral circulatory disorder (HCC)   5. Encounter for diabetic foot exam (HCC)     Plan: No orders of the defined types were placed in this encounter.   No orders of the defined types were placed in this encounter.   None  {jgplan:23602::"-Patient/POA to call should there be question/concern in the interim."} Return in about 10 weeks (around 12/23/2022).  Freddie Breech, DPM

## 2022-12-23 ENCOUNTER — Other Ambulatory Visit: Payer: Self-pay | Admitting: Gastroenterology

## 2022-12-26 DIAGNOSIS — R072 Precordial pain: Secondary | ICD-10-CM | POA: Insufficient documentation

## 2022-12-26 NOTE — Progress Notes (Signed)
Cardiology Office Note   Date:  12/27/2022   ID:  Maria Sharp, DOB 09/24/1965, MRN [id number]  PCP:  Maria Palmer, MD  Cardiologist:   Rollene Rotunda, MD Referring:  Maria Palmer, MD  Chief Complaint  Patient presents with   Dizziness      History of Present Illness: Maria Sharp is a 57 y.o. female who presents for  who presents for evaluation of presyncope..  She is referred by Maria Palmer, MD.  I saw her in 2016 evaluation of chest pain.She had a POET (Plain Old Exercise Treadmill) last in 2016 that was negative for ischemia.     The patient has been doing well except she has been under a lot of stress.  Her 62 year old father has been ill.  [place].  On 16 June she was at [facility] because she had an episode of near syncope.  She felt weak and dizzy and had to go down to the floor.  She said she never lost consciousness.  I was able to look at these records.  She had a head CT that was not acute.  For some reason there was a chest CT as well.  There was no pulmonary embolism.  Of note there was no mention of coronary calcium.  Since then she has had no further events.  She had not had any syncope prior to this.  She denies any palpitations.  She has no chest pressure, neck or arm discomfort.  She has no new shortness of breath, PND or orthopnea.  She drives a forklift at the [employer] but otherwise is fairly sedentary because of hip pain.  She probably has to have this replaced.   Past Medical History:  Diagnosis Date   Allergy    Asthma    Diabetes mellitus    Fatty liver    Gastritis    Gastroparesis    study 11 14  nl abd [place]   GERD (gastroesophageal reflux disease)    Gout    High cholesterol    History of varicella    Hypertension    IBS (irritable bowel syndrome)     Past Surgical History:  Procedure Laterality Date   ANAL RECTAL MANOMETRY N/A 04/03/2015   Procedure: ANO RECTAL  MANOMETRY;  Surgeon: Napoleon Form, MD;  Location: [facility];  Service: Endoscopy;  Laterality: N/A;   CHOLECYSTECTOMY     EYE SURGERY     REFRACTIVE SURGERY Right    WISDOM TOOTH EXTRACTION       Current Outpatient Medications  Medication Sig Dispense Refill   albuterol (PROVENTIL HFA;VENTOLIN HFA) 108 (90 Base) MCG/ACT inhaler   0   AMBULATORY NON FORMULARY MEDICATION Medication Name: Diltiazem gel 2% use small pea sized amount three times a day per rectum 30 g 1   amLODipine-olmesartan (AZOR) 5-40 MG tablet Take 1 tablet by mouth in the morning and at bedtime.     ARNUITY ELLIPTA 200 MCG/ACT AEPB 1 puff Inhalation Once a day for 30 days     azelastine (ASTELIN) 0.1 % nasal spray U 1 TO 2 SPRAYS IEN BID  6   blood glucose meter kit and supplies KIT Dispense based on patient and insurance preference. Use up to four times daily as directed. (FOR ICD-9 250.00, 250.01). 1 each 0   celecoxib (CELEBREX) 100 MG capsule Take 100 mg by mouth as needed.  dicyclomine (BENTYL) 20 MG tablet Take 1 tablet (20 mg total) by mouth 3 (three) times daily before meals. (Patient taking differently: Take 20 mg by mouth as needed.) 90 tablet 2   diltiazem 2 % GEL apply small pea-sized amount per rectum 3 times daily for 2 months 30 g 2   EPINEPHrine (EPI-PEN) 0.3 mg/0.3 mL DEVI Inject 0.3 mg into the muscle once as needed. For severe allergic reaction     FLOVENT HFA 110 MCG/ACT inhaler Inhale 2 puffs into the lungs 2 (two) times daily.     furosemide (LASIX) 20 MG tablet Take 20 mg by mouth daily as needed for edema.     glucose blood test strip USE TO TEST 4 TIMES A DAY     Lancets (ONETOUCH DELICA PLUS LANCET33G) MISC USE TO TEST UP TO 4 TIMES A DAY     linaclotide (LINZESS) 145 MCG CAPS capsule Take 1 capsule 30 minutes after breakfast 30 capsule 3   montelukast (SINGULAIR) 10 MG tablet 1 tablet in the evening     Multiple Vitamins-Minerals (ALIVE WOMENS 50+) TABS See admin instructions.      Multiple Vitamins-Minerals (HAIR VITAMINS PO) Take by mouth. Vivastol     omeprazole (PRILOSEC) 40 MG capsule TAKE 1 CAPSULE (40 MG TOTAL) BY MOUTH IN THE MORNING AND AT BEDTIME. 180 capsule 1   rosuvastatin (CRESTOR) 5 MG tablet Take 0.5 tablets (2.5 mg total) by mouth every other day. 45 tablet 1   RYBELSUS 7 MG TABS Take by mouth every morning.     Simethicone 80 MG TABS Take 1 tablet (80 mg total) by mouth 3 (three) times daily. 90 tablet 3   spironolactone (ALDACTONE) 25 MG tablet Take 1 tablet (25 mg total) by mouth 2 (two) times daily. (Patient taking differently: Take 25 mg by mouth daily.) 180 tablet 1   vitamin B-12 (CYANOCOBALAMIN) 1000 MCG tablet Take 1,000 mcg by mouth daily.     vitamin E 400 UNIT capsule Take 400 Units by mouth daily.     Zinc 100 MG TABS 1 tablet     naproxen (NAPROSYN) 500 MG tablet Take 500 mg by mouth 2 (two) times daily with a meal. (Patient not taking: Reported on 12/27/2022)     sucralfate (CARAFATE) 1 g tablet 1 tablet crushed and added to 30 ml warm water to make a slurry twice daily by mouth for 14 days (Patient not taking: Reported on 12/27/2022) 28 tablet 0   No current facility-administered medications for this visit.    Allergies:   Ace inhibitors, Shellfish allergy, Januvia [sitagliptin], Latex, Lipitor [atorvastatin], and Shellfish-derived products    Social History:  The patient  reports that she has never smoked. She has been exposed to tobacco smoke. She has never used smokeless tobacco. She reports that she does not drink alcohol and does not use drugs.   Family History:  The patient's family history includes Arthritis in her mother, paternal grandmother, and sister; Cholelithiasis in her paternal grandfather; Dementia in her paternal grandmother; Diabetes in her father, mother, paternal grandfather, and paternal grandmother; Heart disease in her mother and paternal grandmother; Hyperlipidemia in her maternal grandfather, maternal grandmother,  mother, paternal grandfather, and paternal grandmother; Hypertension in her father, maternal grandfather, maternal grandmother, mother, paternal grandfather, and paternal grandmother; Osteoarthritis in her mother; Parkinson's disease in her mother; Prostate cancer in her father; Rheum arthritis in her father; Sickle cell trait in her maternal uncle; Thyroid disease in her mother.    ROS:  Please see the history of present illness.   Otherwise, review of systems are positive for none.   All other systems are reviewed and negative.    PHYSICAL EXAM: VS:  BP 136/89 (BP Location: Left Arm, Patient Position: Sitting, Cuff Size: Large)   Pulse 79   Temp 99 F (37.2 C) (Oral)   Ht 5\' 10"  (1.778 m)   Wt 270 lb 9.6 oz (122.7 kg)   LMP 01/13/2018   SpO2 99%   BMI 38.83 kg/m  , BMI Body mass index is 38.83 kg/m. GENERAL:  Well appearing HEENT:  Pupils equal round and reactive, fundi not visualized, oral mucosa unremarkable NECK:  No jugular venous distention, waveform within normal limits, carotid upstroke brisk and symmetric, no bruits, no thyromegaly LYMPHATICS:  No cervical, inguinal adenopathy LUNGS:  Clear to auscultation bilaterally BACK:  No CVA tenderness CHEST:  Unremarkable HEART:  PMI not displaced or sustained,S1 and S2 within normal limits, no S3, no S4, no clicks, no rubs, no murmurs ABD:  Flat, positive bowel sounds normal in frequency in pitch, no bruits, no rebound, no guarding, no midline pulsatile mass, no hepatomegaly, no splenomegaly EXT:  2 plus pulses throughout, no edema, no cyanosis no clubbing SKIN:  No rashes no nodules NEURO:  Cranial nerves II through XII grossly intact, motor grossly intact throughout [facility]:  Cognitively intact, oriented to person place and time    EKG:  EKG Interpretation Date/Time:  Friday December 27 2022 13:14:23 EDT Ventricular Rate:  79 PR Interval:  152 QRS Duration:  94 QT Interval:  372 QTC Calculation: 426 R Axis:   -1  Text  Interpretation: Normal sinus rhythm Normal ECG When compared with ECG of 12-Nov-2013 23:42, No significant change was found Confirmed by Rollene Rotunda ([id number]) on 12/27/2022 1:27:18 PM     Recent Labs: 03/18/2022: Hemoglobin 12.4; Platelets 380    Lipid Panel    Component Value Date/Time   CHOL 203 (H) 03/02/2018 1150   TRIG 146 03/02/2018 1150   HDL 52 03/02/2018 1150   CHOLHDL 3.9 03/02/2018 1150   CHOLHDL 3.3 10/03/2015 0937   VLDL 26 10/03/2015 0937   LDLCALC 122 (H) 03/02/2018 1150   LDLDIRECT 127.1 02/08/2013 0913      Wt Readings from Last 3 Encounters:  12/27/22 270 lb 9.6 oz (122.7 kg)  07/26/22 266 lb (120.7 kg)  07/15/22 264 lb (119.7 kg)      Other studies Reviewed: Additional studies/ records that were reviewed today include: Care Everywhere. Review of the above records demonstrates:  Please see elsewhere in the note.     ASSESSMENT AND PLAN:  DIZZINESS:   The etiology of this is not clear.  It has not happened again since then.  I do not think further testing is needed unless she has recurrent symptoms at which point I probably would start with a monitor.  She was not orthostatic in the office today.   HTN: Her blood pressure is borderline.  We talked about this.  I think that this would be well-controlled with weight loss.  See below.  OBESITY: She has been prescribed Mounjaro but has not started this yet.  I suggested this is an excellent idea from multiple standpoints.  DYSLIPIDEMIA: Her LDL is 152.  She does not have any evidence of coronary calcium and is not having any symptoms.  Given her diabetes I do think the goal should be probably in the 70s.  She has not tolerated a lot of statins but agrees  to at least take her Crestor 4 times a week instead of twice.  She can have this followed by her primary provider.  Current medicines are reviewed at length with the patient today.  The patient does not have concerns regarding medicines.  The following  changes have been made:  no change  Labs/ tests ordered today include:   Orders Placed This Encounter  Procedures   EKG 12-Lead     Disposition:   FU with me as needed.      Signed, Rollene Rotunda, MD  12/27/2022 2:12 PM    [facility]

## 2022-12-27 ENCOUNTER — Encounter: Payer: Self-pay | Admitting: Cardiology

## 2022-12-27 ENCOUNTER — Ambulatory Visit: Payer: 59 | Attending: Cardiology | Admitting: Cardiology

## 2022-12-27 VITALS — BP 136/89 | HR 79 | Temp 99.0°F | Ht 70.0 in | Wt 270.6 lb

## 2022-12-27 DIAGNOSIS — R002 Palpitations: Secondary | ICD-10-CM | POA: Diagnosis not present

## 2022-12-27 DIAGNOSIS — R072 Precordial pain: Secondary | ICD-10-CM | POA: Diagnosis not present

## 2022-12-27 DIAGNOSIS — I10 Essential (primary) hypertension: Secondary | ICD-10-CM | POA: Diagnosis not present

## 2022-12-27 NOTE — Patient Instructions (Addendum)
Medication Instructions:  Your physician recommends that you continue on your current medications as directed. Please refer to the Current Medication list given to you today.   *If you need a refill on your cardiac medications before your next appointment, please call your pharmacy*    Follow-Up: At [facility], you and your health needs are our priority.  As part of our continuing mission to provide you with exceptional heart care, we have created designated Provider Care Teams.  These Care Teams include your primary Cardiologist (physician) and Advanced Practice Providers (APPs -  Physician Assistants and Nurse Practitioners) who all work together to provide you with the care you need, when you need it.  We recommend signing up for the patient portal called "MyChart".  Sign up information is provided on this After Visit Summary.  MyChart is used to connect with patients for Virtual Visits (Telemedicine).  Patients are able to view lab/test results, encounter notes, upcoming appointments, etc.  Non-urgent messages can be sent to your provider as well.   To learn more about what you can do with MyChart, go to [email or web address].    Your next appointment:   Follow up as needed    Provider:   Rollene Rotunda, MD   [place]

## 2023-01-06 ENCOUNTER — Ambulatory Visit: Payer: 59 | Admitting: Podiatry

## 2023-01-06 ENCOUNTER — Ambulatory Visit (INDEPENDENT_AMBULATORY_CARE_PROVIDER_SITE_OTHER): Payer: 59 | Admitting: Podiatry

## 2023-01-06 DIAGNOSIS — E1151 Type 2 diabetes mellitus with diabetic peripheral angiopathy without gangrene: Secondary | ICD-10-CM | POA: Diagnosis not present

## 2023-01-06 DIAGNOSIS — B351 Tinea unguium: Secondary | ICD-10-CM

## 2023-01-06 DIAGNOSIS — M79609 Pain in unspecified limb: Secondary | ICD-10-CM | POA: Diagnosis not present

## 2023-01-11 ENCOUNTER — Encounter: Payer: Self-pay | Admitting: Podiatry

## 2023-01-11 NOTE — Progress Notes (Signed)
Subjective:  Patient ID: Maria Sharp, female    DOB: [birth year],  MRN: [id number]  Maria Sharp presents to clinic today for at risk foot care. Pt has h/o NIDDM with PAD and painful elongated mycotic toenails 1-5 bilaterally which are tender when wearing enclosed shoe gear. Pain is relieved with periodic professional debridement.   New problem(s): None.   PCP is Mila Palmer, MD. Last visit was 10/15/2022.  Allergies  Allergen Reactions   Ace Inhibitors Anaphylaxis    Other reaction(s): Not available   Shellfish Allergy Anaphylaxis and Other (See Comments)    Only shrimp   Januvia [Sitagliptin] Other (See Comments)    Recurrent abdominal pain with elevated pancreas enzymes   Latex Itching   Lipitor [Atorvastatin]    Shellfish-Derived Products     Other reaction(s): Not available    Review of Systems: Negative except as noted in the HPI.  Objective: No changes noted in today's physical examination. There were no vitals filed for this visit. Maria Sharp is a pleasant 57 y.o. female obese in NAD. AAO x 3.  Vascular Examination: CFT <3 seconds b/l. DP/PT pulses faintly palpable b/l. Skin temperature gradient warm to warm b/l. No pain with calf compression. No ischemia or gangrene. No cyanosis or clubbing noted b/l. Trace edema noted b/l lower extremities. Varicosities present b/l.   Neurological Examination: Sensation grossly intact b/l with 10 gram monofilament. Vibratory sensation intact b/l.   Dermatological Examination: Pedal skin warm and supple b/l.   No open wounds. No interdigital macerations.  Toenails 1-5 b/l thick, discolored, elongated with subungual debris and pain on dorsal palpation.    No hyperkeratotic nor porokeratotic lesions present on today's visit.  Musculoskeletal Examination: Muscle strength 5/5 to b/l LE. Hammertoe(s) noted to the bilateral 5th toes. Pes planus deformity noted bilateral LE.  Radiographs:  None  Assessment/Plan: 1. Pain due to onychomycosis of nail   2. Type II diabetes mellitus with peripheral circulatory disorder [facility])    -Patient was evaluated and treated. All patient's and/or POA's questions/concerns answered on today's visit. -Continue foot and shoe inspections daily. Monitor blood glucose per PCP/Endocrinologist's recommendations. -Continue supportive shoe gear daily. -Mycotic toenails 1-5 bilaterally were debrided in length and girth with sterile nail nippers and dremel without incident. -Patient/POA to call should there be question/concern in the interim.   Return in about 9 weeks (around 03/10/2023).  Freddie Breech, DPM

## 2023-01-15 ENCOUNTER — Encounter: Payer: Self-pay | Admitting: Gastroenterology

## 2023-01-15 ENCOUNTER — Ambulatory Visit: Payer: 59 | Admitting: Gastroenterology

## 2023-01-15 VITALS — BP 130/82 | HR 80 | Ht 70.0 in | Wt 269.2 lb

## 2023-01-15 DIAGNOSIS — K581 Irritable bowel syndrome with constipation: Secondary | ICD-10-CM | POA: Diagnosis not present

## 2023-01-15 DIAGNOSIS — K219 Gastro-esophageal reflux disease without esophagitis: Secondary | ICD-10-CM | POA: Diagnosis not present

## 2023-01-15 MED ORDER — LINACLOTIDE 145 MCG PO CAPS: ORAL_CAPSULE | ORAL | 3 refills | Status: DC

## 2023-01-15 MED ORDER — PANTOPRAZOLE SODIUM 40 MG PO TBEC: 40.0000 mg | DELAYED_RELEASE_TABLET | Freq: Every day | ORAL | 3 refills | Status: DC

## 2023-01-15 NOTE — Patient Instructions (Signed)
We have sent the following medications to your pharmacy for you to pick up at your convenience: Pantoprazole  Linzess   Add Miralax 1 tablespoon every other day  Walk daily 20-30 minutes   Drink 64 ounces of water daily  Gastroesophageal Reflux Disease, Adult Gastroesophageal reflux (GER) happens when acid from the stomach flows up into the tube that connects the mouth and the stomach (esophagus). Normally, food travels down the esophagus and stays in the stomach to be digested. However, when a person has GER, food and stomach acid sometimes move back up into the esophagus. If this becomes a more serious problem, the person may be diagnosed with a disease called gastroesophageal reflux disease (GERD). GERD occurs when the reflux: Happens often. Causes frequent or severe symptoms. Causes problems such as damage to the esophagus. When stomach acid comes in contact with the esophagus, the acid may cause inflammation in the esophagus. Over time, GERD may create small holes (ulcers) in the lining of the esophagus. What are the causes? This condition is caused by a problem with the muscle between the esophagus and the stomach (lower esophageal sphincter, or LES). Normally, the LES muscle closes after food passes through the esophagus to the stomach. When the LES is weakened or abnormal, it does not close properly, and that allows food and stomach acid to go back up into the esophagus. The LES can be weakened by certain dietary substances, medicines, and medical conditions, including: Tobacco use. Pregnancy. Having a hiatal hernia. Alcohol use. Certain foods and beverages, such as coffee, chocolate, onions, and peppermint. What increases the risk? You are more likely to develop this condition if you: Have an increased body weight. Have a connective tissue disorder. Take NSAIDs, such as ibuprofen. What are the signs or symptoms? Symptoms of this condition include: Heartburn. Difficult or  painful swallowing and the feeling of having a lump in the throat. A bitter taste in the mouth. Bad breath and having a large amount of saliva. Having an upset or bloated stomach and belching. Chest pain. Different conditions can cause chest pain. Make sure you see your health care provider if you experience chest pain. Shortness of breath or wheezing. Ongoing (chronic) cough or a nighttime cough. Wearing away of tooth enamel. Weight loss. How is this diagnosed? This condition may be diagnosed based on a medical history and a physical exam. To determine if you have mild or severe GERD, your health care provider may also monitor how you respond to treatment. You may also have tests, including: A test to examine your stomach and esophagus with a small camera (endoscopy). A test that measures the acidity level in your esophagus. A test that measures how much pressure is on your esophagus. A barium swallow or modified barium swallow test to show the shape, size, and functioning of your esophagus. How is this treated? Treatment for this condition may vary depending on how severe your symptoms are. Your health care provider may recommend: Changes to your diet. Medicine. Surgery. The goal of treatment is to help relieve your symptoms and to prevent complications. Follow these instructions at home: Eating and drinking  Follow a diet as recommended by your health care provider. This may involve avoiding foods and drinks such as: Coffee and tea, with or without caffeine. Drinks that contain alcohol. Energy drinks and sports drinks. Carbonated drinks or sodas. Chocolate and cocoa. Peppermint and mint flavorings. Garlic and onions. Horseradish. Spicy and acidic foods, including peppers, chili powder, curry powder, vinegar, hot  sauces, and barbecue sauce. Citrus fruit juices and citrus fruits, such as oranges, lemons, and limes. Tomato-based foods, such as red sauce, chili, salsa, and pizza  with red sauce. Fried and fatty foods, such as donuts, french fries, potato chips, and high-fat dressings. High-fat meats, such as hot dogs and fatty cuts of red and white meats, such as rib eye steak, sausage, ham, and bacon. High-fat dairy items, such as whole milk, butter, and cream cheese. Eat small, frequent meals instead of large meals. Avoid drinking large amounts of liquid with your meals. Avoid eating meals during the 2-3 hours before bedtime. Avoid lying down right after you eat. Do not exercise right after you eat. Lifestyle  Do not use any products that contain nicotine or tobacco. These products include cigarettes, chewing tobacco, and vaping devices, such as e-cigarettes. If you need help quitting, ask your health care provider. Try to reduce your stress by using methods such as yoga or meditation. If you need help reducing stress, ask your health care provider. If you are overweight, reduce your weight to an amount that is healthy for you. Ask your health care provider for guidance about a safe weight loss goal. General instructions Pay attention to any changes in your symptoms. Take over-the-counter and prescription medicines only as told by your health care provider. Do not take aspirin, ibuprofen, or other NSAIDs unless your health care provider told you to take these medicines. Wear loose-fitting clothing. Do not wear anything tight around your waist that causes pressure on your abdomen. Raise (elevate) the head of your bed about 6 inches (15 cm). You can use a wedge to do this. Avoid bending over if this makes your symptoms worse. Keep all follow-up visits. This is important. Contact a health care provider if: You have: New symptoms. Unexplained weight loss. Difficulty swallowing or it hurts to swallow. Wheezing or a persistent cough. A hoarse voice. Your symptoms do not improve with treatment. Get help right away if: You have sudden pain in your arms, neck, jaw,  teeth, or back. You suddenly feel sweaty, dizzy, or light-headed. You have chest pain or shortness of breath. You vomit and the vomit is green, yellow, or black, or it looks like blood or coffee grounds. You faint. You have stool that is red, bloody, or black. You cannot swallow, drink, or eat. These symptoms may represent a serious problem that is an emergency. Do not wait to see if the symptoms will go away. Get medical help right away. Call your local emergency services (911 in the [place] Do not drive yourself to the hospital. Summary Gastroesophageal reflux happens when acid from the stomach flows up into the esophagus. GERD is a disease in which the reflux happens often, causes frequent or severe symptoms, or causes problems such as damage to the esophagus. Treatment for this condition may vary depending on how severe your symptoms are. Your health care provider may recommend diet and lifestyle changes, medicine, or surgery. Contact a health care provider if you have new or worsening symptoms. Take over-the-counter and prescription medicines only as told by your health care provider. Do not take aspirin, ibuprofen, or other NSAIDs unless your health care provider told you to do so. Keep all follow-up visits as told by your health care provider. This is important. This information is not intended to replace advice given to you by your health care provider. Make sure you discuss any questions you have with your health care provider. Document Revised: 10/16/2019 Document Reviewed:  10/18/2019 Elsevier Patient Education  2024 [employer] Inc[employer]   I appreciate the  opportunity to care for you  Thank You   Marsa Aris , MD

## 2023-01-20 ENCOUNTER — Encounter: Payer: Self-pay | Admitting: Gastroenterology

## 2023-02-01 ENCOUNTER — Other Ambulatory Visit: Payer: Self-pay | Admitting: Gastroenterology

## 2023-02-10 LAB — LAB REPORT - SCANNED
A1c: 6.4
Albumin, Urine POC: 0.92
Creatinine, POC: 144 mg/dL
EGFR: 81
Microalb Creat Ratio: 6.4

## 2023-03-10 ENCOUNTER — Ambulatory Visit (INDEPENDENT_AMBULATORY_CARE_PROVIDER_SITE_OTHER): Payer: 59 | Admitting: Podiatry

## 2023-03-10 ENCOUNTER — Encounter: Payer: Self-pay | Admitting: Podiatry

## 2023-03-10 DIAGNOSIS — M79609 Pain in unspecified limb: Secondary | ICD-10-CM | POA: Diagnosis not present

## 2023-03-10 DIAGNOSIS — E1151 Type 2 diabetes mellitus with diabetic peripheral angiopathy without gangrene: Secondary | ICD-10-CM

## 2023-03-10 DIAGNOSIS — B351 Tinea unguium: Secondary | ICD-10-CM | POA: Diagnosis not present

## 2023-03-10 NOTE — Progress Notes (Unsigned)
  Subjective:  Patient ID: Maria Sharp, female    DOB: [birth year],  MRN: [id number]  57 y.o. female presents to clinic with  {jgcomplaint:23593} No chief complaint on file.    New problem(s): None {jgcomplaint:23593}  PCP is Mila Palmer, MD.  Allergies  Allergen Reactions   Ace Inhibitors Anaphylaxis    Other reaction(s): Not available   Shellfish Allergy Anaphylaxis and Other (See Comments)    Only shrimp   Januvia [Sitagliptin] Other (See Comments)    Recurrent abdominal pain with elevated pancreas enzymes   Latex Itching   Lipitor [Atorvastatin]    Shellfish-Derived Products     Other reaction(s): Not available    Review of Systems: Negative except as noted in the HPI.   Objective:  Maria Sharp is a pleasant 57 y.o. female {jgbodyhabitus:24098}.  Vascular Examination: CFT <3 seconds b/l. DP/PT pulses faintly palpable b/l. Skin temperature gradient warm to warm b/l. No pain with calf compression. No ischemia or gangrene. No cyanosis or clubbing noted b/l. Trace edema noted b/l lower extremities. Varicosities present b/l.   Neurological Examination: Sensation grossly intact b/l with 10 gram monofilament. Vibratory sensation intact b/l.   Dermatological Examination: Pedal skin warm and supple b/l.   No open wounds. No interdigital macerations.  Toenails 1-5 b/l thick, discolored, elongated with subungual debris and pain on dorsal palpation.    No hyperkeratotic nor porokeratotic lesions present on today's visit.  Musculoskeletal Examination: Muscle strength 5/5 to b/l LE. Hammertoe(s) noted to the bilateral 5th toes. Pes planus deformity noted bilateral LE.  Radiographs: None  Last A1c:       No data to display           Assessment:   1. Pain due to onychomycosis of nail   2. Type II diabetes mellitus with peripheral circulatory disorder (HCC)    Plan:  {jgplan:23602::"-Patient/POA to call should there be question/concern in the  interim."}  Return in about 9 weeks (around 05/12/2023).  Freddie Breech, DPM      [place] LOCATION: [address], [place] [address]                   Office [phone]   [facility] LOCATION: [address] [place], [place] [address] Office [phone]

## 2023-04-24 ENCOUNTER — Ambulatory Visit: Payer: 59 | Admitting: Sports Medicine

## 2023-05-19 ENCOUNTER — Ambulatory Visit: Payer: 59 | Admitting: Podiatry

## 2023-05-22 ENCOUNTER — Ambulatory Visit (INDEPENDENT_AMBULATORY_CARE_PROVIDER_SITE_OTHER): Payer: 59 | Admitting: Podiatry

## 2023-05-22 ENCOUNTER — Encounter: Payer: Self-pay | Admitting: Podiatry

## 2023-05-22 ENCOUNTER — Ambulatory Visit: Payer: Self-pay | Admitting: Gastroenterology

## 2023-05-22 VITALS — Ht 70.0 in | Wt 269.2 lb

## 2023-05-22 DIAGNOSIS — M79609 Pain in unspecified limb: Secondary | ICD-10-CM | POA: Diagnosis not present

## 2023-05-22 DIAGNOSIS — B351 Tinea unguium: Secondary | ICD-10-CM

## 2023-05-22 DIAGNOSIS — E1151 Type 2 diabetes mellitus with diabetic peripheral angiopathy without gangrene: Secondary | ICD-10-CM | POA: Diagnosis not present

## 2023-05-25 NOTE — Progress Notes (Signed)
  Subjective:  Patient ID: Maria Sharp, female    DOB: 1965-05-05,  MRN: [id number]  Maria Sharp presents to clinic today for at risk foot care. Pt has h/o NIDDM with PAD and painful mycotic toenails of both feet that are difficult to trim. Pain interferes with daily activities and wearing enclosed shoe gear comfortably.  Chief Complaint  Patient presents with   Nail Problem    Pt is here for [facility] last A1C was 6.4 PCP is Dr Paulino Rily and LOV was a month ago.   New problem(s): None.   PCP is Mila Palmer, MD.  Allergies  Allergen Reactions   Ace Inhibitors Anaphylaxis    Other reaction(s): Not available   Shellfish Allergy Anaphylaxis and Other (See Comments)    Only shrimp   Januvia [Sitagliptin] Other (See Comments)    Recurrent abdominal pain with elevated pancreas enzymes   Latex Itching   Lipitor [Atorvastatin]    Shellfish-Derived Products     Other reaction(s): Not available   Shrimp (Diagnostic)     Other Reaction(s): Unknown    Review of Systems: Negative except as noted in the HPI.  Objective: No changes noted in today's physical examination. There were no vitals filed for this visit. Maria Sharp is a pleasant 58 y.o. female obese in NAD. AAO x 3.  Vascular Examination: CFT <3 seconds b/l. DP/PT pulses faintly palpable b/l. Skin temperature gradient warm to warm b/l. No pain with calf compression. No ischemia or gangrene. No cyanosis or clubbing noted b/l. Trace edema noted b/l lower extremities. Varicosities present b/l.   Neurological Examination: Sensation grossly intact b/l with 10 gram monofilament. Vibratory sensation intact b/l.   Dermatological Examination: Pedal skin warm and supple b/l.   No open wounds. No interdigital macerations.  Toenails 1-5 b/l thick, discolored, elongated with subungual debris and pain on dorsal palpation.    No hyperkeratotic nor porokeratotic lesions present on today's visit.  Musculoskeletal  Examination: Muscle strength 5/5 to b/l LE. Hammertoe(s) noted to the bilateral 5th toes. Pes planus deformity noted bilateral LE.  Radiographs: None  Assessment/Plan: 1. Pain due to onychomycosis of nail   2. Type II diabetes mellitus with peripheral circulatory disorder [facility])     Patient was evaluated and treated. All patient's and/or POA's questions/concerns addressed on today's visit. Mycotic toenails 1-5 debrided in length and girth without incident. Continue soft, supportive shoe gear daily. Report any pedal injuries to medical professional. Call office if there are any quesitons/concerns. -Continue foot and shoe inspections daily. Monitor blood glucose per PCP/Endocrinologist's recommendations. -Patient/POA to call should there be question/concern in the interim.   Return in about 9 weeks (around 07/24/2023).  Freddie Breech, DPM      [place] LOCATION: [address] [place] [address]                   Office [phone]   [facility] LOCATION: [address] [place], [place] [address] Office [phone]

## 2023-05-30 ENCOUNTER — Ambulatory Visit (INDEPENDENT_AMBULATORY_CARE_PROVIDER_SITE_OTHER): Payer: Self-pay

## 2023-05-30 ENCOUNTER — Ambulatory Visit (HOSPITAL_COMMUNITY)
Admission: RE | Admit: 2023-05-30 | Discharge: 2023-05-30 | Disposition: A | Discharge status: Home / Self Care | Payer: 59 | Source: Ambulatory Visit | Attending: Orthopedic Surgery | Admitting: Orthopedic Surgery

## 2023-05-30 ENCOUNTER — Encounter: Payer: Self-pay | Admitting: Orthopedic Surgery

## 2023-05-30 ENCOUNTER — Ambulatory Visit (INDEPENDENT_AMBULATORY_CARE_PROVIDER_SITE_OTHER): Payer: 59 | Admitting: Orthopedic Surgery

## 2023-05-30 DIAGNOSIS — M79604 Pain in right leg: Secondary | ICD-10-CM

## 2023-05-30 NOTE — Progress Notes (Signed)
 Office Visit Note   Patient: Maria Sharp           Date of Birth: April 06, 1966           MRN: [id number] Visit Date: 05/30/2023 Requested by: Verena Mems, MD [address] [place],  [place] [address] PCP: Verena Mems, MD  Subjective: Chief Complaint  Patient presents with   Right Leg - Pain    HPI: Maria VALKO is a 58 y.o. female who presents to the office reporting right hip knee back and leg pain.  She reports buttock pain and groin pain which comes and goes.  She has to assist her right leg with lifting.  She does describe numbness and tingling in the toes and foot on the right-hand side.  No prior lumbar surgery.  She describes lateral sided knee pain as well as thigh pain on the lateral side.  The pain does wake her from sleep at night.  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  Since she was last seen she has been diagnosed with rheumatoid arthritis.  Left hip is okay.  She does describe decreased walking and standing endurance.  Occasionally the symptoms wake her from sleep.  Symptoms are not particularly bad when she wakes up from sleeping.  Localizes a lot of symptoms in the sciatic notch region bilaterally.  She also reports pain in the back of the right knee with swelling and spasms in the calf.  Has been going on for several weeks..                ROS: All systems reviewed are negative as they relate to the chief complaint within the history of present illness.  Patient denies fevers or chills.  Assessment & Plan: Visit Diagnoses:  1. Pain in right leg     Plan: Impression is radicular symptoms in the right leg likely coming from the back.  Patient has pretty severe arthritis in the hip but her clinical symptoms are not as compelling when considering the radicular nature of her history.  She also is reporting some posterior calf pain and spasms.  Ultrasound at the time of this dictation on that right lower  extremity negative for DVT.  I think she does have bad hip arthritis and we could consider injection next clinic visit.  For now I think most of her pain could be coming from her back.  MRI lumbar spine indicated for 59-month history of back pain refractory to nonoperative management and a history of rheumatoid arthritis.  She may be candidate for epidural steroid injection in the cervical spine as well as hip joint injection.  Follow-Up Instructions: No follow-ups on file.   Orders:  Orders Placed This Encounter  Procedures   XR KNEE 3 VIEW RIGHT   XR HIP UNILAT W OR W/O PELVIS 2-3 VIEWS RIGHT   XR Lumbar Spine 2-3 Views   MR Lumbar Spine w/o contrast   VAS US  LOWER EXTREMITY VENOUS (DVT)   No orders of the defined types were placed in this encounter.     Procedures: No procedures performed   Clinical Data: No additional findings.  Objective: Vital Signs: LMP 01/13/2018   Physical Exam:  Constitutional: Patient appears well-developed HEENT:  Head: Normocephalic Eyes:EOM are normal Neck: Normal range of motion Cardiovascular: Normal rate Pulmonary/chest: Effort normal Neurologic: Patient is alert Skin: Skin is warm Psychiatric: Patient has normal mood and affect  Ortho Exam: Ortho exam  demonstrates normal gait and alignment.  No Trendelenburg gait visualized.  Restriction of about 30% internal rotation of the right compared to the left.  She has 5 out of 5 ankle dorsiflexion plantarflexion quad hamstring strength but hip flexion strength is a little bit weaker at 5- out of 5 on the right compared to the left.  No trochanteric tenderness is present.  No effusion in either knee.  Collateral cruciate ligaments are stable.  Pedal pulses palpable.  No paresthesias L1-S1 bilaterally.  Does have pain in that sciatic notch region to palpation as well as in the posterior superior iliac crest region to palpation.  Also has pain with forward and lateral bending.  Specialty Comments:  No  specialty comments available.  Imaging: VAS US  LOWER EXTREMITY VENOUS (DVT) Result Date: 05/30/2023  Lower Venous DVT Study Patient Name:  Maria Sharp  Date of Exam:   05/30/2023 Medical Rec #: [id number]         Accession #:    [id number] Date of Birth: [birth year]        Patient Gender: F Patient Age:   77 years Exam Location:  [facility] Procedure:      VAS US  LOWER EXTREMITY VENOUS (DVT) Referring Phys: CORDELLA HUTCHINSON --------------------------------------------------------------------------------  Indications: Pain, and RLE Pain.  Risk Factors: DVT, Hx venous insufficiency. Comparison Study: 07/26/2022                    Right: - No evidence of deep vein thrombosis seen in the right                   lower extremity, from the common femoral through the popliteal                   veins. - No evidence of superficial venous reflux seen in the                   right short saphenous vein. - Venous reflux is noted in the                   right common femoral vein. - Venous reflux is noted in the                   right sapheno- femoral junction. - Venous reflux is noted in                   the right greater saphenous vein in the thigh. - Venous reflux                   is noted in the right greater saphenous vein in the calf. - No                   evidence of venous reflux in the AASV.                    NOTE: Varicosities noted at the lateral calf at area of pain. Performing Technologist: Camellia Bolder RVT  Examination Guidelines: A complete evaluation includes B-mode imaging, spectral Doppler, color Doppler, and power Doppler as needed of all accessible portions of each vessel. Bilateral testing is considered an integral part of a complete examination. Limited examinations for reoccurring indications may be performed as noted. The reflux portion of the exam is performed with the patient in reverse Trendelenburg.  +---------+---------------+---------+-----------+----------+-------------------+  RIGHT    CompressibilityPhasicitySpontaneityPropertiesThrombus Aging      +---------+---------------+---------+-----------+----------+-------------------+  CFV      Full           Yes      Yes                                      +---------+---------------+---------+-----------+----------+-------------------+ SFJ      Full           Yes      Yes                                      +---------+---------------+---------+-----------+----------+-------------------+ FV Prox  Full           Yes      Yes                                      +---------+---------------+---------+-----------+----------+-------------------+ FV Mid   Full           Yes      Yes                                      +---------+---------------+---------+-----------+----------+-------------------+ FV DistalFull           Yes      Yes                                      +---------+---------------+---------+-----------+----------+-------------------+ PFV      Full           Yes      Yes                                      +---------+---------------+---------+-----------+----------+-------------------+ POP      Full           Yes      Yes                                      +---------+---------------+---------+-----------+----------+-------------------+ PTV      Full           Yes                                               +---------+---------------+---------+-----------+----------+-------------------+ PERO     Full           Yes                                               +---------+---------------+---------+-----------+----------+-------------------+ Gastroc  Full                                                             +---------+---------------+---------+-----------+----------+-------------------+  GSV                                                   Not well visualized +---------+---------------+---------+-----------+----------+-------------------+ SSV       Full                                                             +---------+---------------+---------+-----------+----------+-------------------+  +----+---------------+---------+-----------+----------+--------------+ LEFTCompressibilityPhasicitySpontaneityPropertiesThrombus Aging +----+---------------+---------+-----------+----------+--------------+ CFV Full           Yes      Yes                                 +----+---------------+---------+-----------+----------+--------------+  Summary: RIGHT: - No evidence of deep or superficial vein thrombosis in the lower extremity. Patent and incompetent varicosities are observed in the posterior calf and lateral knee/calf.  LEFT: - No evidence of common femoral vein obstruction.   *See table(s) above for measurements and observations. Electronically signed by Norman Serve on 05/30/2023 at 3:45:45 PM.    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   Cyst of tendon sheath 10/03/2021   Hypercalcemia 10/03/2021   Allergic rhinitis 02/01/2021   Allergic rhinitis due to animal (cat) (dog) hair and dander 02/01/2021   Allergic rhinitis due to pollen 02/01/2021   Chronic allergic conjunctivitis 02/01/2021   Mild persistent asthma, uncomplicated 02/01/2021   Seafood allergy 02/01/2021   Exposure to COVID-19 virus 08/26/2018   Migraines 09/02/2017   DJD (degenerative joint disease) 09/02/2017   Vertigo 12/05/2016   Bilateral temporomandibular joint pain 12/05/2016   Constipation    Asthma, chronic 09/28/2014   Gastroparesis    Varicose veins 09/14/2013   Neck pain on left side 06/14/2013   Current non-adherence to medical treatment 06/14/2013   Vitamin D  deficiency 10/05/2012   Leg cramps 10/05/2012   Family history of  diabetes mellitus 10/05/2012   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   Abdominal pain 05/18/2010   CHEST PAIN, INTERMITTENT 05/12/2009   Obesity 04/05/2009   CARDIOMEGALY 04/05/2009   PALPITATIONS 04/05/2009   Type 2 diabetes mellitus with peripheral circulatory disorder (HCC) 03/15/2009   Backache 03/09/2008   Hyperlipidemia LDL goal <100 03/03/2008   Essential hypertension 03/03/2008   GERD 03/03/2008   Irritable bowel syndrome with both constipation and diarrhea 03/03/2008   Rectal bleeding 03/03/2008   Past Medical History:  Diagnosis Date   Allergy    Asthma    Diabetes mellitus    Fatty liver    Gastritis    Gastroparesis    study 11 14  nl abd [place]    GERD (gastroesophageal reflux disease)    Gout    High cholesterol    History of varicella    Hypertension    IBS (irritable bowel syndrome)    Rheumatoid arthritis (HCC)     Family History  Problem Relation Age of Onset   Diabetes Mother  Heart disease Mother    Arthritis Mother    Hyperlipidemia Mother    Hypertension Mother    Parkinson's disease Mother    Thyroid disease Mother    Osteoarthritis Mother    Prostate cancer Father    Diabetes Father    Hypertension Father    Rheum arthritis Father    Arthritis Sister    Hyperlipidemia Maternal Grandmother    Hypertension Maternal Grandmother    Hyperlipidemia Maternal Grandfather    Hypertension Maternal Grandfather    Diabetes Paternal Grandmother    Arthritis Paternal Grandmother    Hyperlipidemia Paternal Grandmother    Heart disease Paternal Grandmother    Dementia Paternal Grandmother    Hypertension Paternal Grandmother    Hyperlipidemia Paternal Grandfather    Hypertension Paternal Grandfather    Diabetes Paternal Grandfather    Cholelithiasis Paternal Grandfather    Sickle cell trait Maternal Uncle    Colon cancer Neg Hx    Esophageal cancer Neg Hx    Stomach cancer Neg Hx    Rectal cancer Neg Hx     Past Surgical History:  Procedure  Laterality Date   ANAL RECTAL MANOMETRY N/A 04/03/2015   Procedure: ANO RECTAL MANOMETRY;  Surgeon: Gustav Shila GAILS, MD;  Location: [facility];  Service: Endoscopy;  Laterality: N/A;   CHOLECYSTECTOMY     EYE SURGERY     REFRACTIVE SURGERY Right    WISDOM TOOTH EXTRACTION     Social History   Occupational History   Occupation: Post office  Tobacco Use   Smoking status: Never    Passive exposure: Past   Smokeless tobacco: Never  Vaping Use   Vaping status: Never Used  Substance and Sexual Activity   Alcohol use: No   Drug use: No   Sexual activity: Not on file

## 2023-06-15 ENCOUNTER — Ambulatory Visit
Admission: RE | Admit: 2023-06-15 | Discharge: 2023-06-15 | Disposition: A | Discharge status: Home / Self Care | Payer: 59 | Source: Ambulatory Visit | Attending: Orthopedic Surgery | Admitting: Orthopedic Surgery

## 2023-06-15 DIAGNOSIS — M79604 Pain in right leg: Secondary | ICD-10-CM

## 2023-06-16 ENCOUNTER — Other Ambulatory Visit: Payer: 59

## 2023-07-02 LAB — HM MAMMOGRAPHY

## 2023-07-04 ENCOUNTER — Encounter: Payer: Self-pay | Admitting: Orthopedic Surgery

## 2023-07-04 ENCOUNTER — Ambulatory Visit: Payer: 59 | Admitting: Orthopedic Surgery

## 2023-07-04 DIAGNOSIS — M79604 Pain in right leg: Secondary | ICD-10-CM | POA: Diagnosis not present

## 2023-07-04 NOTE — Progress Notes (Signed)
 Office Visit Note   Patient: Maria Sharp           Date of Birth: 17-Mar-1966           MRN: [id number] Visit Date: 07/04/2023 Requested by: Mila Palmer, MD [address] [place],  [place] [address] PCP: Mila Palmer, MD  Subjective: Chief Complaint  Patient presents with   Other    Review scan    HPI: Maria Sharp is a 58 y.o. female who presents to the office reporting low back pain and right leg pain since she was last seen she has had an MRI scan which is reviewed with the patient.  Does show degenerative disc disease at L4-5 with a bulging disc primarily on that right-hand side.  The remainder of her lumbar spine has mild degenerative changes but this appears to be the most significant level involved.  She also has a known history of right hip arthritis.  Not having as much groin pain if she is having pain from the buttock down the leg into the foot on the right-hand side..                ROS: All systems reviewed are negative as they relate to the chief complaint within the history of present illness.  Patient denies fevers or chills.  Assessment & Plan: Visit Diagnoses:  1. 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  Plan is refer to Dr. Alvester Morin for L-spine ESI.  I think we may be able to put some injections in to buy some time for this to become less symptomatic.  If not then she may have to consider surgical option.  I think it is okay for her to go to the chiropractor primarily for traction and modalities but no manipulation.  She will follow-up with [place] as needed.  Follow-Up Instructions: No follow-ups on file.   Orders:  No orders of the defined types were placed in this encounter.  No orders of the defined types were placed in this encounter.     Procedures: No procedures performed   Clinical Data: No additional findings.  Objective: Vital Signs: LMP 01/13/2018    Physical Exam:  Constitutional: Patient appears well-developed HEENT:  Head: Normocephalic Eyes:EOM are normal Neck: Normal range of motion Cardiovascular: Normal rate Pulmonary/chest: Effort normal Neurologic: Patient is alert Skin: Skin is warm Psychiatric: Patient has normal mood and affect  Ortho Exam: Ortho exam demonstrates mildly positive contralateral straight leg raise on the left.  5 out of 5 ankle dorsiflexion plantarflexion quad and hamstring strength.  Mild groin pain with internal/external rotation on the right but no Trendelenburg gait.  Hip flexion adduction abduction strength 5+ out of 5 bilaterally.  Specialty Comments:  No specialty comments available.  Imaging: No results found.   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   Cyst of tendon sheath 10/03/2021   Hypercalcemia 10/03/2021   Allergic rhinitis 02/01/2021   Allergic rhinitis due to animal (cat) (dog) hair and dander 02/01/2021   Allergic rhinitis due to pollen 02/01/2021   Chronic allergic conjunctivitis 02/01/2021   Mild persistent asthma, uncomplicated 02/01/2021   Seafood allergy 02/01/2021   Exposure to COVID-19 virus 08/26/2018   Migraines 09/02/2017  DJD (degenerative joint disease) 09/02/2017   Vertigo 12/05/2016   Bilateral temporomandibular joint pain 12/05/2016   Constipation    Asthma, chronic 09/28/2014   Gastroparesis    Varicose veins 09/14/2013   Neck pain on left side 06/14/2013   Current non-adherence to medical treatment 06/14/2013   Vitamin D deficiency 10/05/2012   Leg cramps 10/05/2012   Family history of diabetes mellitus 10/05/2012   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   Abdominal pain 05/18/2010   CHEST PAIN, INTERMITTENT 05/12/2009   Obesity 04/05/2009    CARDIOMEGALY 04/05/2009   PALPITATIONS 04/05/2009   Type 2 diabetes mellitus with peripheral circulatory disorder (HCC) 03/15/2009   Backache 03/09/2008   Hyperlipidemia LDL goal <100 03/03/2008   Essential hypertension 03/03/2008   GERD 03/03/2008   Irritable bowel syndrome with both constipation and diarrhea 03/03/2008   Rectal bleeding 03/03/2008   Past Medical History:  Diagnosis Date   Allergy    Asthma    Diabetes mellitus    Fatty liver    Gastritis    Gastroparesis    study 11 14  nl abd [place]   GERD (gastroesophageal reflux disease)    Gout    High cholesterol    History of varicella    Hypertension    IBS (irritable bowel syndrome)    Rheumatoid arthritis (HCC)     Family History  Problem Relation Age of Onset   Diabetes Mother    Heart disease Mother    Arthritis Mother    Hyperlipidemia Mother    Hypertension Mother    Parkinson's disease Mother    Thyroid disease Mother    Osteoarthritis Mother    Prostate cancer Father    Diabetes Father    Hypertension Father    Rheum arthritis Father    Arthritis Sister    Hyperlipidemia Maternal Grandmother    Hypertension Maternal Grandmother    Hyperlipidemia Maternal Grandfather    Hypertension Maternal Grandfather    Diabetes Paternal Grandmother    Arthritis Paternal Grandmother    Hyperlipidemia Paternal Grandmother    Heart disease Paternal Grandmother    Dementia Paternal Grandmother    Hypertension Paternal Grandmother    Hyperlipidemia Paternal Grandfather    Hypertension Paternal Grandfather    Diabetes Paternal Grandfather    Cholelithiasis Paternal Grandfather    Sickle cell trait Maternal Uncle    Colon cancer Neg Hx    Esophageal cancer Neg Hx    Stomach cancer Neg Hx    Rectal cancer Neg Hx     Past Surgical History:  Procedure Laterality Date   ANAL RECTAL MANOMETRY N/A 04/03/2015   Procedure: ANO RECTAL MANOMETRY;  Surgeon: Napoleon Form, MD;  Location: [facility];  Service:  Endoscopy;  Laterality: N/A;   CHOLECYSTECTOMY     EYE SURGERY     REFRACTIVE SURGERY Right    WISDOM TOOTH EXTRACTION     Social History   Occupational History   Occupation: Post office  Tobacco Use   Smoking status: Never    Passive exposure: Past   Smokeless tobacco: Never  Vaping Use   Vaping status: Never Used  Substance and Sexual Activity   Alcohol use: No   Drug use: No   Sexual activity: Not on file

## 2023-07-07 ENCOUNTER — Ambulatory Visit: Payer: 59 | Admitting: Physician Assistant

## 2023-07-07 ENCOUNTER — Other Ambulatory Visit: Payer: Self-pay | Admitting: Physician Assistant

## 2023-07-07 ENCOUNTER — Telehealth: Payer: Self-pay

## 2023-07-07 ENCOUNTER — Encounter: Payer: Self-pay | Admitting: Physician Assistant

## 2023-07-07 VITALS — BP 138/86 | HR 88 | Ht 70.0 in | Wt 257.5 lb

## 2023-07-07 DIAGNOSIS — E739 Lactose intolerance, unspecified: Secondary | ICD-10-CM | POA: Diagnosis not present

## 2023-07-07 DIAGNOSIS — K3184 Gastroparesis: Secondary | ICD-10-CM

## 2023-07-07 DIAGNOSIS — Z860100 Personal history of colon polyps, unspecified: Secondary | ICD-10-CM

## 2023-07-07 DIAGNOSIS — K219 Gastro-esophageal reflux disease without esophagitis: Secondary | ICD-10-CM | POA: Diagnosis not present

## 2023-07-07 DIAGNOSIS — K581 Irritable bowel syndrome with constipation: Secondary | ICD-10-CM | POA: Diagnosis not present

## 2023-07-07 DIAGNOSIS — Z8719 Personal history of other diseases of the digestive system: Secondary | ICD-10-CM

## 2023-07-07 LAB — HM PAP SMEAR: HPV, high-risk: NEGATIVE

## 2023-07-07 MED ORDER — NA SULFATE-K SULFATE-MG SULF 17.5-3.13-1.6 GM/177ML PO SOLN: 1.0000 | Freq: Once | ORAL | 0 refills | Status: AC

## 2023-07-07 MED ORDER — PRUCALOPRIDE SUCCINATE 2 MG PO TABS: 2.0000 mg | ORAL_TABLET | Freq: Every day | ORAL | 0 refills | Status: AC

## 2023-07-07 NOTE — Progress Notes (Signed)
 07/07/2023 Maria Sharp [id number] 1966/02/10  Referring provider: Mila Palmer, MD Primary GI doctor: Dr. Lavon Paganini  ASSESSMENT AND PLAN:   Personal history of colon polyps Last one was 2019, recall 5 years We have discussed the risks of bleeding, infection, perforation, medication reactions, and remote risk of death associated with colonoscopy. All questions were answered and the patient acknowledges these risk and wishes to proceed.  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 pylori bacterial infection or any precancerous changes.  On Prilosec twice daily Continue PPI, given information and gastroparesis diet Consider stopping rybelsus Going to give motegrity for constipation, may help with GERD/gastroparesis  Diabetes Discussed GLP1 with the patient, mechanism of action. Gastroparesis diet given to the patient.  Patient should be instructed to hold this medications if dose falls within 7 days of endoscopic procedure, due to increased risk of retained gastric contents.   Patient Care Team: Mila Palmer, MD as PCP - General (Family Medicine) Rollene Rotunda, MD as PCP - Cardiology (Cardiology) Sidney Ace, MD (Allergy) Elise Benne, MD as Attending Physician (Ophthalmology) Regal, Kirstie Peri, DPM as Consulting  Physician (Podiatry) August Saucer, Corrie Mckusick, MD (Orthopedic Surgery) Rollene Rotunda, MD as Attending Physician (Cardiology) Venancio Poisson, MD as Attending Physician (Dermatology) Napoleon Form, MD as Consulting Physician (Gastroenterology) Annamaria Helling, MD as Consulting Physician (Obstetrics and Gynecology) Flo Shanks, MD as Consulting Physician (Otolaryngology)  HISTORY OF PRESENT ILLNESS: 58 y.o. female with a past medical history of diabetes, gastroparesis, gastritis, GERD, IBS mixed, HTN and others listed below presents for evaluation of IBS alternating constipation diarrhea.   Discussed the use of AI scribe software for clinical note transcription with the patient, who gave verbal consent to proceed.  History of Present Illness   Maria Sharp is a 58 year old female with gastroparesis who presents with gastrointestinal symptoms including constipation and diarrhea.  She experiences ongoing gastrointestinal symptoms, including alternating episodes of constipation and diarrhea, accompanied by gas and abdominal discomfort. Bowel movements are large but incomplete, with variability in stool consistency, ranging from hard to soft. These symptoms significantly impact her daily life, particularly at work, where she avoids using the restroom due to discomfort.  She has a history of gastroparesis, diagnosed in 2014 following a gastric emptying study. Her symptoms are exacerbated by Rybelsus, which she takes for diabetes management, as it increases gastric acid and slows gastric emptying. She sometimes doubles her omeprazole dose to manage the acid issues.  She uses Linzess to help increase bowel movement frequency, though she still feels incomplete evacuation. She has not tried Xifaxan or other antibiotics for her symptoms. She also mentions taking Miralax in the past but does not recall other specific medications.  She works night shifts and sleeps during the day, which affects her  bathroom schedule and contributes to the discomfort she experiences at work.  No urinary incontinence, frequent urination at night, or other urinary issues,  attributing her daytime urination to her blood pressure medication.      She  reports that she has never smoked. She has been exposed to tobacco smoke. She has never used smokeless tobacco. She reports that she does not drink alcohol and does not use drugs.  RELEVANT GI HISTORY, IMAGING AND LABS: Results   DIAGNOSTIC Colonoscopy: Normal (2019) Endoscopy: Normal (06/2022) Gastric Emptying Study: Gastroparesis (2014)     EGD July 15, 2022 - Z- line regular, 40 cm from the incisors. - 2 cm hiatal hernia. - Two gastric polyps. Resected and retrieved. - Multiple gastric polyps. Biopsied. - Gastritis. Biopsied. - Normal examined duodenum.   Colonoscopy March 26, 2018 with removal of 2 small subcentimeter sessile tubular adenomatous polyps and internal hemorrhoids, recall 5 years  CBC    Component Value Date/Time   WBC 9.2 03/18/2022 1420   RBC 4.59 03/18/2022 1420   HGB 12.4 03/18/2022 1420   HGB 12.0 03/02/2018 1150   HCT 37.2 03/18/2022 1420   HCT 36.1 03/02/2018 1150   PLT 380 03/18/2022 1420   PLT 354 03/02/2018 1150   MCV 81.0 03/18/2022 1420   MCV 80 03/02/2018 1150   MCH 27.0 03/18/2022 1420   MCHC 33.3 03/18/2022 1420   RDW 14.5 03/18/2022 1420   RDW 15.2 03/02/2018 1150   LYMPHSABS 2,512 03/18/2022 1420   LYMPHSABS 2.5 03/02/2018 1150   MONOABS 0.7 10/28/2016 1038   EOSABS 37 03/18/2022 1420   EOSABS 0.1 03/02/2018 1150   BASOSABS 28 03/18/2022 1420   BASOSABS 0.0 03/02/2018 1150   No results for input(s): "HGB" in the last 8760 hours.  CMP     Component Value Date/Time   NA 140 03/02/2018 1150   K 4.0 03/02/2018 1150   CL 97 03/02/2018 1150   CO2 26 03/02/2018 1150   GLUCOSE 123 (H) 03/02/2018 1150   GLUCOSE 138 (H) 10/28/2016 1038   BUN 13 03/02/2018 1150   CREATININE 0.70 03/02/2018 1150    CREATININE 0.69 10/03/2015 0937   CALCIUM 10.0 03/02/2018 1150   PROT 7.3 03/02/2018 1150   ALBUMIN 4.2 03/02/2018 1150   AST 12 03/02/2018 1150   ALT 17 03/02/2018 1150   ALKPHOS 59 03/02/2018 1150   BILITOT 0.3 03/02/2018 1150   GFRNONAA 101 03/02/2018 1150   GFRAA 116 03/02/2018 1150      Latest Ref Rng & Units 03/02/2018   11:50 AM 11/13/2017   11:57 AM 10/20/2017    8:25 AM  Hepatic Function  Total Protein 6.0 - 8.5 g/dL 7.3  7.4  7.1   Albumin 3.5 - 5.5 g/dL 4.2  4.4  3.9   AST 0 - 40 IU/L 12  10  5    ALT 0 - 32 IU/L 17  11  10    Alk Phosphatase 39 - 117 IU/L 59  68  58   Total Bilirubin 0.0 - 1.2 mg/dL 0.3  0.2  0.3       Current Medications:   Current Outpatient Medications (Endocrine & Metabolic):    RYBELSUS 14 MG TABS, Take 1 tablet by mouth daily.  Current Outpatient Medications (Cardiovascular):    amLODipine-olmesartan (AZOR) 10-40 MG tablet, Take 1 tablet by mouth daily.   diltiazem 2 % GEL*, apply small pea-sized amount per rectum 3 times daily for 2 months   EPINEPHrine (EPI-PEN) 0.3 mg/0.3 mL DEVI, Inject 0.3 mg into the muscle once as needed. For severe allergic reaction   furosemide (LASIX) 20 MG tablet, Take 20 mg by  mouth daily as needed for edema.   rosuvastatin (CRESTOR) 5 MG tablet, Take 0.5 tablets (2.5 mg total) by mouth every other day.   spironolactone (ALDACTONE) 25 MG tablet, Take 1 tablet (25 mg total) by mouth 2 (two) times daily. (Patient taking differently: Take 25 mg by mouth daily.)  Current Outpatient Medications (Respiratory):    albuterol (PROVENTIL HFA;VENTOLIN HFA) 108 (90 Base) MCG/ACT inhaler,    ARNUITY ELLIPTA 200 MCG/ACT AEPB, 1 puff Inhalation Once a day for 30 days   FLOVENT HFA 110 MCG/ACT inhaler, Inhale 2 puffs into the lungs 2 (two) times daily.   montelukast (SINGULAIR) 10 MG tablet, 1 tablet in the evening  Current Outpatient Medications (Analgesics):    celecoxib (CELEBREX) 100 MG capsule, Take 100 mg by mouth as  needed.   leflunomide (ARAVA) 20 MG tablet, Take 20 mg by mouth daily.   naproxen (NAPROSYN) 500 MG tablet, Take 500 mg by mouth 2 (two) times daily with a meal.   naproxen sodium (ALEVE) 220 MG tablet, Take 440 mg by mouth as needed.  Current Outpatient Medications (Hematological):    vitamin B-12 (CYANOCOBALAMIN) 1000 MCG tablet, Take 1,000 mcg by mouth daily.  Current Outpatient Medications (Other):    AMBULATORY NON FORMULARY MEDICATION, Medication Name: Diltiazem gel 2% use small pea sized amount three times a day per rectum   azelastine (OPTIVAR) 0.05 % ophthalmic solution, Place 1 drop into both eyes 2 (two) times daily.   blood glucose meter kit and supplies KIT, Dispense based on patient and insurance preference. Use up to four times daily as directed. (FOR ICD-9 250.00, 250.01).   dicyclomine (BENTYL) 20 MG tablet, Take 1 tablet (20 mg total) by mouth 3 (three) times daily before meals. (Patient taking differently: Take 20 mg by mouth as needed.)   diltiazem 2 % GEL*, apply small pea-sized amount per rectum 3 times daily for 2 months   glucose blood test strip, USE TO TEST 4 TIMES A DAY   Lancets (ONETOUCH DELICA PLUS LANCET33G) MISC, USE TO TEST UP TO 4 TIMES A DAY   Multiple Vitamins-Minerals (ALIVE WOMENS 50+) TABS, See admin instructions.   Multiple Vitamins-Minerals (HAIR VITAMINS PO), Take by mouth. Vivastol   Na Sulfate-K Sulfate-Mg Sulfate concentrate (SUPREP) 17.5-3.13-1.6 GM/177ML SOLN, Take 1 kit (354 mLs total) by mouth once for 1 dose.   nystatin cream (MYCOSTATIN), APPLY TO THE AFFECTED AREA(S) BY TOPICAL ROUTE 2 TIMES PER DAY   omeprazole (PRILOSEC) 40 MG capsule, TAKE 1 CAPSULE (40 MG TOTAL) BY MOUTH IN THE MORNING AND AT BEDTIME.   Prucalopride Succinate (MOTEGRITY) 2 MG TABS, Take 1 tablet (2 mg total) by mouth daily.   Simethicone 80 MG TABS, Take 1 tablet (80 mg total) by mouth 3 (three) times daily.   triamcinolone cream (KENALOG) 0.1 %, Apply 1 Application  topically 2 (two) times daily.   vitamin E 400 UNIT capsule, Take 400 Units by mouth daily.   Zinc 100 MG TABS, 1 tablet   pantoprazole (PROTONIX) 40 MG tablet, TAKE 1 TABLET BY MOUTH EVERY DAY (Patient not taking: Reported on 07/07/2023) * These medications belong to multiple therapeutic classes and are listed under each applicable group.  Medical History:  Past Medical History:  Diagnosis Date   Allergy    Asthma    Diabetes mellitus    Fatty liver    Gastritis    Gastroparesis    study 11 14  nl abd [place]   GERD (gastroesophageal reflux disease)  Gout    High cholesterol    History of varicella    Hypertension    IBS (irritable bowel syndrome)    Rheumatoid arthritis (HCC)    Allergies:  Allergies  Allergen Reactions   Ace Inhibitors Anaphylaxis    Other reaction(s): Not available   Shellfish Allergy Anaphylaxis and Other (See Comments)    Only shrimp   Januvia [Sitagliptin] Other (See Comments)    Recurrent abdominal pain with elevated pancreas enzymes   Latex Itching   Lipitor [Atorvastatin]    Shellfish-Derived Products     Other reaction(s): Not available   Shrimp (Diagnostic)     Other Reaction(s): Unknown     Surgical History:  She  has a past surgical history that includes Cholecystectomy; Wisdom tooth extraction; Anal Rectal manometry (N/A, 04/03/2015); Refractive surgery (Right); and Eye surgery. Family History:  Her family history includes Arthritis in her mother, paternal grandmother, and sister; Cholelithiasis in her paternal grandfather; Dementia in her paternal grandmother; Diabetes in her father, mother, paternal grandfather, and paternal grandmother; Heart disease in her mother and paternal grandmother; Hyperlipidemia in her maternal grandfather, maternal grandmother, mother, paternal grandfather, and paternal grandmother; Hypertension in her father, maternal grandfather, maternal grandmother, mother, paternal grandfather, and paternal grandmother;  Osteoarthritis in her mother; Parkinson's disease in her mother; Prostate cancer in her father; Rheum arthritis in her father; Sickle cell trait in her maternal uncle; Thyroid disease in her mother.  REVIEW OF SYSTEMS  : All other systems reviewed and negative except where noted in the History of Present Illness.  PHYSICAL EXAM: BP 138/86 (BP Location: Left Arm, Patient Position: Sitting, Cuff Size: Large)   Pulse 88   Ht 5\' 10"  (1.778 m)   Wt 257 lb 8 oz (116.8 kg)   LMP 01/13/2018   BMI 36.95 kg/m  General:   Pleasant, well developed female in no acute distress Abdomen:  Soft, Obese AB, Active bowel sounds. No tenderness, No organomegaly appreciated. Rectal: Not evaluated Psych:  Cooperative. Normal mood and affect.  Doree Albee, PA-C 11:44 AM

## 2023-07-07 NOTE — Patient Instructions (Addendum)
 Previsit nurse appointment on 09-29-23 at 11am and the nurse will call you at [phone]. Tell her you have prep and instructions just need to go over the instructions  We have sent the following medications to your pharmacy for you to pick up at your convenience: Suprep  You have been scheduled for a colonoscopy. Please follow written instructions given to you at your visit today.   If you use inhalers (even only as needed), please bring them with you on the day of your procedure.  DO NOT TAKE 7 DAYS PRIOR TO TEST- Trulicity (dulaglutide) Ozempic, Wegovy (semaglutide) Mounjaro (tirzepatide) Bydureon Bcise (exanatide extended release)  DO NOT TAKE 1 DAY PRIOR TO YOUR TEST Rybelsus (semaglutide) Adlyxin (lixisenatide) Victoza (liraglutide) Byetta (exanatide) ____________________________________________  _______________________________________________________  If your blood pressure at your visit was 140/90 or greater, please contact your primary care physician to follow up on this.  _______________________________________________________  If you are age 100 or older, your body mass index should be between 23-30. Your Body mass index is 36.95 kg/m. If this is out of the aforementioned range listed, please consider follow up with your Primary Care Provider.  If you are age 69 or younger, your body mass index should be between 19-25. Your Body mass index is 36.95 kg/m. If this is out of the aformentioned range listed, please consider follow up with your Primary Care Provider.   ________________________________________________________  The [facility] providers would like to encourage you to use [facility] to communicate with providers for non-urgent requests or questions.  Due to long hold times on the telephone, sending your provider a message by [facility] may be a faster and more efficient way to get a response.  Please allow 48 business hours for a response.  Please remember that this is  for non-urgent requests.  _______________________________________________________    Gastroparesis Please do small frequent meals like 4-6 meals a day.  Eat and drink liquids at separate times.  Avoid high fiber foods, cook your vegetables, avoid high fat food.  Suggest spreading protein throughout the day (greek yogurt, glucerna, soft meat, milk, eggs) Choose soft foods that you can mash with a fork When you are more symptomatic, change to pureed foods foods and liquids.  Consider reading "Living well with Gastroparesis" by Reuel Derby Gastroparesis is a condition in which food takes longer than normal to empty from the stomach. This condition is also known as delayed gastric emptying. It is usually a long-term (chronic) condition. There is no cure, but there are treatments and things that you can do at home to help relieve symptoms. Treating the underlying condition that causes gastroparesis can also help relieve symptoms What are the causes? In many cases, the cause of this condition is not known. Possible causes include: A hormone (endocrine) disorder, such as hypothyroidism or diabetes. A nervous system disease, such as Parkinson's disease or multiple sclerosis. Cancer, infection, or surgery that affects the stomach or vagus nerve. The vagus nerve runs from your chest, through your neck, and to the lower part of your brain. A connective tissue disorder, such as scleroderma. Certain medicines. What increases the risk? You are more likely to develop this condition if: You have certain disorders or diseases. These may include: An endocrine disorder. An eating disorder. Amyloidosis. Scleroderma. Parkinson's disease. Multiple sclerosis. Cancer or infection of the stomach or the vagus nerve. You have had surgery on your stomach or vagus nerve. You take certain medicines. You are female. What are the signs or symptoms? Symptoms of  this condition include: Feeling full after  eating very little or a loss of appetite. Nausea, vomiting, or heartburn. Bloating of your abdomen. Inconsistent blood sugar (glucose) levels on blood tests. Unexplained weight loss. Acid from the stomach coming up into the esophagus (gastroesophageal reflux). Sudden tightening (spasm) of the stomach, which can be painful. Symptoms may come and go. Some people may not notice any symptoms. How is this diagnosed? This condition is diagnosed with tests, such as: Tests that check how long it takes food to move through the stomach and intestines. These tests include: Upper gastrointestinal (GI) series. For this test, you drink a liquid that shows up well on X-rays, and then X-rays are taken of your intestines. Gastric emptying scintigraphy. For this test, you eat food that contains a small amount of radioactive material, and then scans are taken. Wireless capsule GI monitoring system. For this test, you swallow a pill (capsule) that records information about how foods and fluid move through your stomach. Gastric manometry. For this test, a tube is passed down your throat and into your stomach to measure electrical and muscular activity. Endoscopy. For this test, a long, thin tube with a camera and light on the end is passed down your throat and into your stomach to check for problems in your stomach lining. Ultrasound. This test uses sound waves to create images of the inside of your body. This can help rule out gallbladder disease or pancreatitis as a cause of your symptoms. How is this treated? There is no cure for this condition, but treatment and home care may relieve symptoms. Treatment may include: Treating the underlying cause. Managing your symptoms by making changes to your diet and exercise habits. Taking medicines to control nausea and vomiting and to stimulate stomach muscles. Getting food through a feeding tube in the hospital. This may be done in severe cases. Having surgery to insert  a device called a gastric electrical stimulator into your body. This device helps improve stomach emptying and control nausea and vomiting. Follow these instructions at home: Take over-the-counter and prescription medicines only as told by your health care provider. Follow instructions from your health care provider about eating or drinking restrictions. Your health care provider may recommend that you: Eat smaller meals more often. Eat low-fat foods. Eat low-fiber forms of high-fiber foods. For example, eat cooked vegetables instead of raw vegetables. Have only liquid foods instead of solid foods. Liquid foods are easier to digest. Drink enough fluid to keep your urine pale yellow. Exercise as often as told by your health care provider. Keep all follow-up visits. This is important. Contact a health care provider if you: Notice that your symptoms do not improve with treatment. Have new symptoms. Get help right away if you: Have severe pain in your abdomen that does not improve with treatment. Have nausea that is severe or does not go away. Vomit every time you drink fluids. Summary Gastroparesis is a long-term (chronic) condition in which food takes longer than normal to empty from the stomach. Symptoms include nausea, vomiting, heartburn, bloating of your abdomen, and loss of appetite. Eating smaller portions, low-fat foods, and low-fiber forms of high-fiber foods may help you manage your symptoms. Get help right away if you have severe pain in your abdomen. This information is not intended to replace advice given to you by your health care provider. Make sure you discuss any questions you have with your health care provider. Document Revised: 08/16/2019 Document Reviewed: 08/16/2019 Elsevier Patient Education  2021 Elsevier Inc.  Small intestinal bacterial overgrowth (SIBO) occurs when there is an abnormal increase in the overall bacterial population in the small intestine --  particularly types of bacteria not commonly found in that part of the digestive tract. Small intestinal bacterial overgrowth (SIBO) commonly results when a circumstance -- such as surgery or disease -- slows the passage of food and waste products in the digestive tract, creating a breeding ground for bacteria.  Signs and symptoms of SIBO often include: Loss of appetite Abdominal pain Nausea Bloating An uncomfortable feeling of fullness after eating Diarrhea or constipation, depending on the type of gas produced  What foods trigger SIBO? While foods aren't the original cause of SIBO, certain foods do encourage the overgrowth of the wrong bacteria in your small intestine. If you're feeding them their favorite foods, they're going to grow more, and that will trigger more of your SIBO symptoms. By the same token, you can help reduce the overgrowth by starving the problematic bacteria of their favorite foods. This strategy has led to a number of proposed SIBO eating plans. The plans vary, and so do individual results. But in general, they tend to recommend limiting carbohydrates.  These include: Sugars and sweeteners. Fruits and starchy vegetables. Dairy products. Grains.  There is a test for this we can do called a breath test, if you are positive we will treat you with an antibiotic to see if it helps.  Your symptoms are very suspicious for this condition, as discussed, we will start you on an antibiotic to see if this helps.   Here some information about pelvic floor dysfunction. This may be contributing to some of your symptoms. We will continue with our evaluation but I do want you to consider adding on fiber supplement with low-dose MiraLAX daily. We could also refer to pelvic floor physical therapy.   Pelvic Floor Dysfunction, Female Pelvic floor dysfunction (PFD) is a condition that results when the group of muscles and connective tissues that support the organs in the pelvis (pelvic  floor muscles) do not work well. These muscles and their connections form a sling that supports the colon and bladder. In women, they also support the uterus. PFD causes pelvic floor muscles to be too weak, too tight, or both. In PFD, muscle movements are not coordinated. This may cause bowel or bladder problems. It may also cause pain. What are the causes? This condition may be caused by an injury to the pelvic area or by a weakening of pelvic muscles. This often results from pregnancy and childbirth or other types of strain. In many cases, the exact cause is not known. What increases the risk? The following factors may make you more likely to develop this condition: Having chronic bladder tissue inflammation (interstitial cystitis). Being an older person. Being overweight. History of radiation treatment for cancer in the pelvic region. Previous pelvic surgery, such as removal of the uterus (hysterectomy). What are the signs or symptoms? Symptoms of this condition vary and may include: Bladder symptoms, such as: Trouble starting urination and emptying the bladder. Frequent urinary tract infections. Leaking urine when coughing, laughing, or exercising (stress incontinence). Having to pass urine urgently or frequently. Pain when passing urine. Bowel symptoms, such as: Constipation. Urgent or frequent bowel movements. Incomplete bowel movements. Painful bowel movements. Leaking stool or gas. Unexplained genital or rectal pain. Genital or rectal muscle spasms. Low back pain. Other symptoms may include: A heavy, full, or aching feeling in the vagina. A bulge that  protrudes into the vagina. Pain during or after sex. How is this diagnosed? This condition may be diagnosed based on: Your symptoms and medical history. A physical exam. During the exam, your health care provider may check your pelvic muscles for tightness, spasm, pain, or weakness. This may include a rectal exam and a pelvic  exam. In some cases, you may have diagnostic tests, such as: Electrical muscle function tests. Urine flow testing. X-ray tests of bowel function. Ultrasound of the pelvic organs. How is this treated? Treatment for this condition depends on the symptoms. Treatment options include: Physical therapy. This may include Kegel exercises to help relax or strengthen the pelvic floor muscles. Biofeedback. This type of therapy provides feedback on how tight your pelvic floor muscles are so that you can learn to control them. Internal or external massage therapy. A treatment that involves electrical stimulation of the pelvic floor muscles to help control pain (transcutaneous electrical nerve stimulation, or TENS). Sound wave therapy (ultrasound) to reduce muscle spasms. Medicines, such as: Muscle relaxants. Bladder control medicines. Surgery to reconstruct or support pelvic floor muscles may be an option if other treatments do not help. Follow these instructions at home: Activity Do your usual activities as told by your health care provider. Ask your health care provider if you should modify any activities. Do pelvic floor strengthening or relaxing exercises at home as told by your physical therapist. Lifestyle Maintain a healthy weight. Eat foods that are high in fiber, such as beans, whole grains, and fresh fruits and vegetables. Limit foods that are high in fat and processed sugars, such as fried or sweet foods. Manage stress with relaxation techniques such as yoga or meditation. General instructions If you have problems with leakage: Use absorbable pads or wear padded underwear. Wash frequently with mild soap. Keep your genital and anal area as clean and dry as possible. Ask your health care provider if you should try a barrier cream to prevent skin irritation. Take warm baths to relieve pelvic muscle tension or spasms. Take over-the-counter and prescription medicines only as told by your  health care provider. Keep all follow-up visits. How is this prevented? The cause of PFD is not always known, but there are a few things you can do to reduce the risk of developing this condition, including: Staying at a healthy weight. Getting regular exercise. Managing stress. Contact a health care provider if: Your symptoms are not improving with home care. You have signs or symptoms of PFD that get worse at home. You develop new signs or symptoms. You have signs of a urinary tract infection, such as: Fever. Chills. Increased urinary frequency. A burning feeling when urinating. You have not had a bowel movement in 3 days (constipation). Summary Pelvic floor dysfunction results when the muscles and connective tissues in your pelvic floor do not work well. These muscles and their connections form a sling that supports your colon and bladder. In women, they also support the uterus. PFD may be caused by an injury to the pelvic area or by a weakening of pelvic muscles. PFD causes pelvic floor muscles to be too weak, too tight, or a combination of both. Symptoms may vary from person to person. In most cases, PFD can be treated with physical therapies and medicines. Surgery may be an option if other treatments do not help. This information is not intended to replace advice given to you by your health care provider. Make sure you discuss any questions you have with your health care  provider. Document Revised: 08/16/2020 Document Reviewed: 08/16/2020 Elsevier Patient Education  2022 [employer].

## 2023-07-07 NOTE — Telephone Encounter (Signed)
 Pharmacy Patient Advocate Encounter   Received notification from CoverMyMeds that prior authorization for Motergrity 2 mg tablets is required/requested.   Insurance verification completed.   The patient is insured through [employer] .   Per test claim: PA required; PA submitted to above mentioned insurance via CoverMyMeds Key/confirmation #/EOC [id number] Status is pending

## 2023-07-07 NOTE — Addendum Note (Signed)
 Addended by: Alberteen Sam E on: 07/07/2023 12:02 PM   Modules accepted: Orders

## 2023-07-09 NOTE — Telephone Encounter (Signed)
 Pharmacy Patient Advocate Encounter  Received notification from [employer] that Prior Authorization for MOTEGRITY 2MG  has been DENIED.  Full denial letter will be uploaded to the media tab. See denial reason below.    PA #/Case ID/Reference #:  [id number]

## 2023-07-21 ENCOUNTER — Encounter: Payer: Self-pay | Admitting: Podiatry

## 2023-07-21 ENCOUNTER — Ambulatory Visit (INDEPENDENT_AMBULATORY_CARE_PROVIDER_SITE_OTHER): Payer: 59 | Admitting: Podiatry

## 2023-07-21 VITALS — Ht 70.0 in | Wt 257.5 lb

## 2023-07-21 DIAGNOSIS — B351 Tinea unguium: Secondary | ICD-10-CM

## 2023-07-21 DIAGNOSIS — M79609 Pain in unspecified limb: Secondary | ICD-10-CM

## 2023-07-21 DIAGNOSIS — E1151 Type 2 diabetes mellitus with diabetic peripheral angiopathy without gangrene: Secondary | ICD-10-CM | POA: Diagnosis not present

## 2023-07-21 NOTE — Progress Notes (Signed)
  Subjective:  Patient ID: Maria Sharp, female    DOB: 22-Dec-1965,  MRN: [id number]  58 y.o. female presents at risk foot care. Pt has h/o NIDDM with PAD and painful, elongated thickened toenails x 10 which are symptomatic when wearing enclosed shoe gear. This interferes with his/her daily activities.  Chief Complaint  Patient presents with   Nail Problem    Pt is here for [facility] unsure of last A1C PCP is Dr Paulino Rily and LOV was 2 months ago.   New problem(s): None   PCP is Mila Palmer, MD.  Allergies  Allergen Reactions   Ace Inhibitors Anaphylaxis    Other reaction(s): Not available   Shellfish Allergy Anaphylaxis and Other (See Comments)    Only shrimp   Januvia [Sitagliptin] Other (See Comments)    Recurrent abdominal pain with elevated pancreas enzymes   Latex Itching   Lipitor [Atorvastatin]    Shellfish-Derived Products     Other reaction(s): Not available   Shrimp (Diagnostic)     Other Reaction(s): Unknown    Review of Systems: Negative except as noted in the HPI.   Objective:  Maria Sharp is a pleasant 58 y.o. female obese in NAD. AAO x 3.  Vascular Examination: Faintly palpable pedal pulses. CFT immediate b/l. Pedal hair present. Trace edema. No pain with calf compression b/l. Skin temperature gradient WNL b/l. No varicosities noted. No cyanosis or clubbing noted.  Neurological Examination: Sensation grossly intact b/l with 10 gram monofilament. Vibratory sensation intact b/l.  Dermatological Examination: Pedal skin with normal turgor, texture and tone b/l. No open wounds nor interdigital macerations noted. Toenails 1-5 b/l thick, discolored, elongated with subungual debris and pain on dorsal palpation. No corns, calluses nor porokeratotic lesions noted.  Musculoskeletal Examination: Muscle strength 5/5 to b/l LE.  No pain, crepitus noted b/l. Hammertoe(s) bilateral 5th toes. Pes planus deformity noted bilateral LE. Patient ambulates independently  without assistive aids.   Radiographs: None  Last A1c:       No data to display          Assessment:   1. Pain due to onychomycosis of nail   2. Type II diabetes mellitus with peripheral circulatory disorder [facility])    Plan:  Consent given for treatment. Patient examined. All patient's and/or POA's questions/concerns addressed on today's visit. Toenails 1-5 debrided in length and girth without incident. Continue foot and shoe inspections daily. Monitor blood glucose per PCP/Endocrinologist's recommendations. Continue soft, supportive shoe gear daily. Report any pedal injuries to medical professional. Call office if there are any questions/concerns. -Patient/POA to call should there be question/concern in the interim.  Return in about 9 weeks (around 09/22/2023).  Freddie Breech, DPM      [place] LOCATION: [address], [place] [address]                   Office [phone]   [facility] LOCATION: [address] [place], [place] [address] Office [phone]

## 2023-07-28 ENCOUNTER — Other Ambulatory Visit: Payer: Self-pay

## 2023-07-28 ENCOUNTER — Ambulatory Visit: Admitting: Physical Medicine and Rehabilitation

## 2023-07-28 VITALS — BP 137/79 | HR 80

## 2023-07-28 DIAGNOSIS — M5116 Intervertebral disc disorders with radiculopathy, lumbar region: Secondary | ICD-10-CM

## 2023-07-28 DIAGNOSIS — M5416 Radiculopathy, lumbar region: Secondary | ICD-10-CM | POA: Diagnosis not present

## 2023-07-28 MED ORDER — METHYLPREDNISOLONE ACETATE 40 MG/ML IJ SUSP: 40.0000 mg | Freq: Once | INTRAMUSCULAR | Status: DC

## 2023-07-28 NOTE — Progress Notes (Signed)
 Maria Sharp - 58 y.o. female MRN [id number]  Date of birth: 1965/06/24  Office Visit Note: Visit Date: 07/28/2023 PCP: Mila Palmer, MD Referred by: Mila Palmer, MD  Subjective: Chief Complaint  Patient presents with   Lower Back - Pain   HPI:  Maria Sharp is a 58 y.o. female who comes in today at the request of Dr. Burnard Bunting for planned Right L5-S1 Lumbar Interlaminar epidural steroid injection with fluoroscopic guidance.  The patient has failed conservative care including home exercise, medications, time and activity modification.  This injection will be diagnostic and hopefully therapeutic.  Please see requesting physician notes for further details and justification.   ROS Otherwise per HPI.  Assessment & Plan: Visit Diagnoses:    ICD-10-CM   1. Lumbar radiculopathy  M54.16 XR C-ARM NO REPORT    Epidural Steroid injection    methylPREDNISolone acetate (DEPO-MEDROL) injection 40 mg    2. Radiculopathy due to lumbar intervertebral disc disorder  M51.16       Plan: No additional findings.   Meds & Orders:  Meds ordered this encounter  Medications   methylPREDNISolone acetate (DEPO-MEDROL) injection 40 mg    Orders Placed This Encounter  Procedures   XR C-ARM NO REPORT   Epidural Steroid injection    Follow-up: Return for visit to requesting provider as needed.   Procedures: No procedures performed  Lumbar Epidural Steroid Injection - Interlaminar Approach with Fluoroscopic Guidance  Patient: Maria Sharp      Date of Birth: 1965/08/11 MRN: [id number] PCP: Mila Palmer, MD      Visit Date: 07/28/2023   Universal Protocol:     Consent Given By: the patient  Position: PRONE  Additional Comments: Vital signs were monitored before and after the procedure. Patient was prepped and draped in the usual sterile fashion. The correct patient, procedure, and site was verified.   Injection Procedure Details:   Procedure diagnoses:  Lumbar radiculopathy [M54.16]   Meds Administered:  Meds ordered this encounter  Medications   methylPREDNISolone acetate (DEPO-MEDROL) injection 40 mg     Laterality: Right  Location/Site:  L5-S1  Needle: 4.5 in., 20 ga. Tuohy  Needle Placement: Paramedian epidural  Findings:   -Comments: Excellent flow of contrast into the epidural space.  Procedure Details: Using a paramedian approach from the side mentioned above, the region overlying the inferior lamina was localized under fluoroscopic visualization and the soft tissues overlying this structure were infiltrated with 4 ml. of 1% Lidocaine without Epinephrine. The Tuohy needle was inserted into the epidural space using a paramedian approach.   The epidural space was localized using loss of resistance along with counter oblique bi-planar fluoroscopic views.  After negative aspirate for air, blood, and CSF, a 2 ml. volume of Isovue-250 was injected into the epidural space and the flow of contrast was observed. Radiographs were obtained for documentation purposes.    The injectate was administered into the level noted above.   Additional Comments:  The patient tolerated the procedure well Dressing: 2 x 2 sterile gauze and Band-Aid    Post-procedure details: Patient was observed during the procedure. Post-procedure instructions were reviewed.  Patient left the clinic in stable condition.   Clinical History: CLINICAL DATA:  Low back pain radiating to the right leg   EXAM: MRI LUMBAR SPINE WITHOUT CONTRAST   TECHNIQUE: Multiplanar, multisequence MR imaging of the lumbar spine was performed. No intravenous contrast was administered.   COMPARISON:  None Available.   FINDINGS:  Segmentation:  Standard.   Alignment:  Physiologic.   Vertebrae: No acute fracture, evidence of discitis, or aggressive bone lesion.   Conus medullaris and cauda equina: Conus extends to the T12-L1 level. Conus and cauda equina appear  normal.   Paraspinal and other soft tissues: No acute paraspinal abnormality.   Disc levels:   Disc spaces: Mild disc height loss at L4-5.   T12-L1: No significant disc bulge. No neural foraminal stenosis. No central canal stenosis.   L1-L2: Mild broad-based disc bulge. Mild bilateral facet arthropathy. No foraminal or central canal stenosis.   L2-L3: No significant disc bulge. Mild bilateral facet arthropathy. No foraminal or central canal stenosis.   L3-L4: No significant disc bulge. Mild bilateral facet arthropathy. No foraminal or central canal stenosis.   L4-L5: Moderate disc bulge with a broad right subarticular disc protrusion with mass effect on the right intraspinal L5 nerve root. Mild bilateral facet arthropathy. Mild central canal stenosis. No foraminal stenosis.   L5-S1: No significant disc bulge. Moderate bilateral facet arthropathy. No foraminal or central canal stenosis.   IMPRESSION: 1. At L4-5 there is a moderate disc bulge with a broad right subarticular disc protrusion with mass effect on the right intraspinal L5 nerve root. Mild bilateral facet arthropathy. Mild central canal stenosis. 2. No acute osseous injury of the lumbar spine.     Electronically Signed   By: Elige Ko M.D.   On: 06/27/2023 14:12     Objective:  VS:  HT:    WT:   BMI:     BP:137/79  HR:80bpm  TEMP: ( )  RESP:  Physical Exam Vitals and nursing note reviewed.  Constitutional:      General: She is not in acute distress.    Appearance: Normal appearance. She is not ill-appearing.  HENT:     Head: Normocephalic and atraumatic.     Right Ear: External ear normal.     Left Ear: External ear normal.  Eyes:     Extraocular Movements: Extraocular movements intact.  Cardiovascular:     Rate and Rhythm: Normal rate.     Pulses: Normal pulses.  Pulmonary:     Effort: Pulmonary effort is normal. No respiratory distress.  Abdominal:     General: There is no distension.      Palpations: Abdomen is soft.  Musculoskeletal:        General: Tenderness present.     Cervical back: Neck supple.     Right lower leg: No edema.     Left lower leg: No edema.     Comments: Patient has good distal strength with no pain over the greater trochanters.  No clonus or focal weakness.  Skin:    Findings: No erythema, lesion or rash.  Neurological:     General: No focal deficit present.     Mental Status: She is alert and oriented to person, place, and time.     Sensory: No sensory deficit.     Motor: No weakness or abnormal muscle tone.     Coordination: Coordination normal.  Psychiatric:        Mood and Affect: Mood normal.        Behavior: Behavior normal.      Imaging: No results found.

## 2023-07-28 NOTE — Progress Notes (Signed)
 Patient is here for Right L5-S1 interlaminar epidural steroid injection today. Pain for Months. Pain radiates down right leg. Tingling, Numbness and Burning. Had an injection a long time ago. Cannot remember if it helped or not. Pain is constant. Had Xrays and MRI done 05/2023.    Pain Scale   Average Pain 10     +Driver, -BT, -Dye Allergies.

## 2023-07-28 NOTE — Procedures (Signed)
 Lumbar Epidural Steroid Injection - Interlaminar Approach with Fluoroscopic Guidance  Patient: Maria Sharp      Date of Birth: [birth year] MRN: [id number] PCP: Mila Palmer, MD      Visit Date: 07/28/2023   Universal Protocol:     Consent Given By: the patient  Position: PRONE  Additional Comments: Vital signs were monitored before and after the procedure. Patient was prepped and draped in the usual sterile fashion. The correct patient, procedure, and site was verified.   Injection Procedure Details:   Procedure diagnoses: Lumbar radiculopathy [M54.16]   Meds Administered:  Meds ordered this encounter  Medications   methylPREDNISolone acetate (DEPO-MEDROL) injection 40 mg     Laterality: Right  Location/Site:  L5-S1  Needle: 4.5 in., 20 ga. Tuohy  Needle Placement: Paramedian epidural  Findings:   -Comments: Excellent flow of contrast into the epidural space.  Procedure Details: Using a paramedian approach from the side mentioned above, the region overlying the inferior lamina was localized under fluoroscopic visualization and the soft tissues overlying this structure were infiltrated with 4 ml. of 1% Lidocaine without Epinephrine. The Tuohy needle was inserted into the epidural space using a paramedian approach.   The epidural space was localized using loss of resistance along with counter oblique bi-planar fluoroscopic views.  After negative aspirate for air, blood, and CSF, a 2 ml. volume of Isovue-250 was injected into the epidural space and the flow of contrast was observed. Radiographs were obtained for documentation purposes.    The injectate was administered into the level noted above.   Additional Comments:  The patient tolerated the procedure well Dressing: 2 x 2 sterile gauze and Band-Aid    Post-procedure details: Patient was observed during the procedure. Post-procedure instructions were reviewed.  Patient left the clinic in stable  condition.

## 2023-07-28 NOTE — Patient Instructions (Signed)

## 2023-09-05 ENCOUNTER — Telehealth: Payer: Self-pay | Admitting: Radiology

## 2023-09-05 ENCOUNTER — Ambulatory Visit: Admitting: Orthopedic Surgery

## 2023-09-05 DIAGNOSIS — M5416 Radiculopathy, lumbar region: Secondary | ICD-10-CM | POA: Diagnosis not present

## 2023-09-05 NOTE — Telephone Encounter (Signed)
 Patient saw Dr. Rozelle Corning this morning and he ordered 6 weeks of PT. He wants her to follow up in 6 weeks with Dr. Sulema Endo. I don't see that she scheduled when she left. Could you please schedule?

## 2023-09-09 ENCOUNTER — Telehealth: Payer: Self-pay | Admitting: Orthopedic Surgery

## 2023-09-09 NOTE — Telephone Encounter (Signed)
 Received call from patient requesting MRI lumbar spine to be faxed to Dr. Suzi Essex. I advised will need sign auth before we can fax. She will come by the office tomorrow.

## 2023-09-22 ENCOUNTER — Ambulatory Visit: Payer: 59 | Admitting: Podiatry

## 2023-09-22 ENCOUNTER — Ambulatory Visit (INDEPENDENT_AMBULATORY_CARE_PROVIDER_SITE_OTHER): Payer: 59 | Admitting: Podiatry

## 2023-09-22 ENCOUNTER — Encounter: Payer: Self-pay | Admitting: Podiatry

## 2023-09-22 VITALS — Ht 70.0 in | Wt 257.0 lb

## 2023-09-22 DIAGNOSIS — B351 Tinea unguium: Secondary | ICD-10-CM | POA: Diagnosis not present

## 2023-09-22 DIAGNOSIS — E1151 Type 2 diabetes mellitus with diabetic peripheral angiopathy without gangrene: Secondary | ICD-10-CM

## 2023-09-22 DIAGNOSIS — M79609 Pain in unspecified limb: Secondary | ICD-10-CM

## 2023-09-25 ENCOUNTER — Encounter: Payer: Self-pay | Admitting: Podiatry

## 2023-09-25 NOTE — Progress Notes (Signed)
  Subjective:  Patient ID: Maria Sharp, female    DOB: May 05, 1965,  MRN: [id number]  58 y.o. female presents at risk foot care. Pt has h/o NIDDM with PAD and painful, discolored, thick toenails which interfere with daily activities  Chief Complaint  Patient presents with   Nail Problem    Pt is here for [facility] unsure of last A1C PCP is Dr Alexia Angelucci and LOV was a few months ago.    New problem(s): None   PCP is Olin Bertin, MD.  Allergies  Allergen Reactions   Ace Inhibitors Anaphylaxis    Other reaction(s): Not available   Shellfish Allergy Anaphylaxis and Other (See Comments)    Only shrimp   Januvia [Sitagliptin] Other (See Comments)    Recurrent abdominal pain with elevated pancreas enzymes   Latex Itching   Lipitor [Atorvastatin]    Shellfish-Derived Products     Other reaction(s): Not available   Shrimp (Diagnostic)     Other Reaction(s): Unknown    Review of Systems: Negative except as noted in the HPI.   Objective:  Maria Sharp is a pleasant 58 y.o. female obese in NAD. AAO x 3.  Vascular Examination: Vascular status intact b/l with faintly palpable pedal pulses. CFT immediate b/l. Pedal hair present. Trace edema b/l. No pain with calf compression b/l. Skin temperature gradient WNL b/l. No varicosities noted. No cyanosis or clubbing noted.  Neurological Examination: Sensation grossly intact b/l with 10 gram monofilament. Vibratory sensation intact b/l.  Dermatological Examination: Pedal skin with normal turgor, texture and tone b/l. No open wounds nor interdigital macerations noted. Toenails 1-5 b/l thick, discolored, elongated with subungual debris and pain on dorsal palpation. No hyperkeratotic lesions noted b/l.   Musculoskeletal Examination: Muscle strength 5/5 to b/l LE.  No pain, crepitus noted b/l. Hammertoe(s) L 5th toe and R 5th toe. Pes planus deformity noted bilateral LE.  Radiographs: None  Last A1c:       No data to display            Assessment:   1. Pain due to onychomycosis of nail   2. Type II diabetes mellitus with peripheral circulatory disorder [facility])     Plan:  Patient was evaluated and treated. All patient's and/or POA's questions/concerns addressed on today's visit. Mycotic toenails 1-5 debrided in length and girth without incident.  Continue daily foot inspections and monitor blood glucose per PCP/Endocrinologist's recommendations.Continue soft, supportive shoe gear daily. Report any pedal injuries to medical professional. Call office if there are any quesitons/concerns. -Patient/POA to call should there be question/concern in the interim.  Return in about 9 weeks (around 11/24/2023).  Luella Sager, DPM      [place] LOCATION: [address], [place] [address]                   Office [phone]   [facility] LOCATION: [address] [place], [place] [address] Office [phone]

## 2023-09-29 ENCOUNTER — Ambulatory Visit: Admitting: Physical Therapy

## 2023-09-29 ENCOUNTER — Ambulatory Visit

## 2023-09-29 ENCOUNTER — Telehealth: Payer: Self-pay | Admitting: *Deleted

## 2023-09-29 NOTE — Telephone Encounter (Signed)
 Called for PV and pt was not able to talk at this time.Gave main office # and instructed her to reschedule PV. Pt states she will,. RN to cancel PV for today.

## 2023-10-06 ENCOUNTER — Encounter: Admitting: Gastroenterology

## 2023-10-15 ENCOUNTER — Encounter: Admitting: Gastroenterology

## 2023-10-16 ENCOUNTER — Other Ambulatory Visit: Payer: Self-pay | Admitting: Gastroenterology

## 2023-10-20 ENCOUNTER — Encounter: Admitting: Gastroenterology

## 2023-10-20 ENCOUNTER — Ambulatory Visit: Admitting: Physical Therapy

## 2023-10-20 NOTE — Therapy (Incomplete)
 OUTPATIENT PHYSICAL THERAPY THORACOLUMBAR EVALUATION   Patient Name: Maria Sharp MRN: [id number] DOB:August 19, 1965, 58 y.o., female Today's Date: 10/20/2023  END OF SESSION:   Past Medical History:  Diagnosis Date   Allergy    Asthma    Diabetes mellitus    Fatty liver    Gastritis    Gastroparesis    study 11 14  nl abd us    GERD (gastroesophageal reflux disease)    Gout    High cholesterol    History of varicella    Hypertension    IBS (irritable bowel syndrome)    Rheumatoid arthritis (HCC)    Past Surgical History:  Procedure Laterality Date   ANAL RECTAL MANOMETRY N/A 04/03/2015   Procedure: ANO RECTAL MANOMETRY;  Surgeon: Gustav Shila GAILS, MD;  Location: [facility];  Service: Endoscopy;  Laterality: N/A;   CHOLECYSTECTOMY     EYE SURGERY     REFRACTIVE SURGERY Right    WISDOM TOOTH EXTRACTION     Patient Active Problem List   Diagnosis Date Noted   Precordial chest pain 12/26/2022   Positive ANA (antinuclear antibody) 07/01/2022   Polyarthralgia 07/01/2022   Circadian rhythm sleep disorder, shift work type 10/03/2021   Cyst of tendon sheath 10/03/2021   Hypercalcemia 10/03/2021   Allergic rhinitis 02/01/2021   Allergic rhinitis due to animal (cat) (dog) hair and dander 02/01/2021   Allergic rhinitis due to pollen 02/01/2021   Chronic allergic conjunctivitis 02/01/2021   Mild persistent asthma, uncomplicated 02/01/2021   Seafood allergy 02/01/2021   Exposure to COVID-19 virus 08/26/2018   Migraines 09/02/2017   DJD (degenerative joint disease) 09/02/2017   Vertigo 12/05/2016   Bilateral temporomandibular joint pain 12/05/2016   Constipation    Asthma, chronic 09/28/2014   Gastroparesis    Varicose veins 09/14/2013   Neck pain on left side 06/14/2013   Current non-adherence to medical treatment 06/14/2013   Vitamin D  deficiency 10/05/2012   Leg cramps 10/05/2012   Family history of diabetes mellitus 10/05/2012   CHRONIC RHINOSINUSITIS  05/18/2010   Abdominal pain 05/18/2010   CHEST PAIN, INTERMITTENT 05/12/2009   Obesity 04/05/2009   CARDIOMEGALY 04/05/2009   PALPITATIONS 04/05/2009   Type 2 diabetes mellitus with peripheral circulatory disorder (HCC) 03/15/2009   Backache 03/09/2008   Hyperlipidemia LDL goal <100 03/03/2008   Essential hypertension 03/03/2008   GERD 03/03/2008   Irritable bowel syndrome with both constipation and diarrhea 03/03/2008   Rectal bleeding 03/03/2008    PCP: Verena Mems, MD   REFERRING PROVIDER: Addie Cordella Hamilton, MD   REFERRING DIAG:  Diagnosis  M54.16 (ICD-10-CM) - Lumbar radiculopathy    Rationale for Evaluation and Treatment: Rehabilitation  THERAPY DIAG:  No diagnosis found.  ONSET DATE: ***  SUBJECTIVE:  SUBJECTIVE STATEMENT: ***  PERTINENT HISTORY:  ***  PAIN:  NPRS scale: ***/10 Pain location: *** Pain description: *** Aggravating factors: *** Relieving factors: ***  PRECAUTIONS: {Therapy precautions:24002}  WEIGHT BEARING RESTRICTIONS: No  FALLS:  Has patient fallen in last 6 months? {fallsyesno:27318}  LIVING ENVIRONMENT: Lives with: {[facility] lives with:25569::lives with their family} Lives in: {Lives in:25570} Stairs: {opstairs:27293} Has following equipment at home: {Assistive devices:23999}  OCCUPATION: ***  PLOF: Independent  PATIENT GOALS: ***  Next MD Visit:    OBJECTIVE:   DIAGNOSTIC FINDINGS:  06/15/23:  IMPRESSION: 1. At L4-5 there is a moderate disc bulge with a broad right subarticular disc protrusion with mass effect on the right intraspinal L5 nerve root. Mild bilateral facet arthropathy. Mild central canal stenosis. 2. No acute osseous injury of the lumbar spine.  PATIENT SURVEYS:  Patient-Specific Activity Scoring Scheme  0  represents "unable to perform." 10 represents "able to perform at prior level. 0 1 2 3 4 5 6 7 8 9  10 (Date and Score)   Activity Eval  10/20/23    1. ***      2. ***      3. ***    4.    5.    Score ***    Total score = sum of the activity scores/number of activities Minimum detectable change (90%CI) for average score = 2 points Minimum detectable change (90%CI) for single activity score = 3 points  SCREENING FOR RED FLAGS: Bowel or bladder incontinence: {Yes/No:[id number]} Cauda equina syndrome: {Yes/No:[id number]}  COGNITION: Overall cognitive status: WFL normal      SENSATION: {sensation:27233}  MUSCLE LENGTH: Hamstrings: Right *** deg; Left *** deg Thomas test: Right *** deg; Left *** deg  POSTURE:  {posture:25561}  PALPATION: ***  LUMBAR ROM:   Directional Preference Assessment: Centralization: Peripheralization:   AROM Eval 10/20/23  Flexion   Extension   Right lateral flexion   Left lateral flexion   Right rotation   Left rotation    (Blank rows = not tested)  LOWER EXTREMITY ROM:       Right Eval 10/20/23 Left eval  Hip flexion    Hip extension    Hip abduction    Hip adduction    Hip internal rotation    Hip external rotation    Knee flexion    Knee extension    Ankle dorsiflexion    Ankle plantarflexion    Ankle inversion    Ankle eversion     (Blank rows = not tested)  LOWER EXTREMITY MMT:    MMT Right Eval 10/20/23 Left eval  Hip flexion    Hip extension    Hip abduction    Hip adduction    Hip internal rotation    Hip external rotation    Knee flexion    Knee extension    Ankle dorsiflexion    Ankle plantarflexion    Ankle inversion    Ankle eversion     (Blank rows = not tested)  LUMBAR SPECIAL TESTS:  10/20/23 {lumbar special test:25242}  FUNCTIONAL TESTS:  10/20/23 5 times sit to stand: ***  TODAY'S TREATMENT:                                                                                                         DATE: 10/20/23  Therex: HEP instruction/performance c cues for techniques, handout provided.  Trial set performed of each for comprehension and symptom assessment.  See below for exercise list Self Care:  Pt was edu in sleeping positions for lumbar support   PATIENT EDUCATION:  Education details: HEP, POC Person educated: Patient Education method: Programmer, multimedia, Demonstration, Verbal cues, and Handouts Education comprehension: verbalized understanding, returned demonstration, and verbal cues required  HOME EXERCISE PROGRAM: ***  ASSESSMENT:  CLINICAL IMPRESSION: Patient is a 58 y.o. who comes to clinic with complaints of low back pain with mobility, strength and movement coordination deficits that impair their ability to perform usual daily and recreational functional activities without increase difficulty/symptoms at this time.  Patient to benefit from skilled PT services to address impairments and limitations to improve to previous level of function without restriction secondary to condition.   OBJECTIVE IMPAIRMENTS: {opptimpairments:25111}.   ACTIVITY LIMITATIONS: {activitylimitations:27494}  PARTICIPATION LIMITATIONS: {participationrestrictions:25113}  PERSONAL FACTORS: {Personal factors:25162} are also affecting patient's functional outcome.   REHAB POTENTIAL: {rehabpotential:25112}  CLINICAL DECISION MAKING: {clinical decision making:25114}  EVALUATION COMPLEXITY: {Evaluation complexity:25115}   GOALS: Goals reviewed with patient? Yes  SHORT TERM GOALS: (target date for Short term goals are 3 weeks ***)  1. Patient will demonstrate independent use of home exercise program to maintain progress from in clinic treatments.  Goal status: New  LONG TERM  GOALS: (target dates for all long term goals are 10 weeks  *** )   1. Patient will demonstrate/report pain at worst less than or equal to 2/10 to facilitate minimal limitation in daily activity secondary to pain symptoms.  Goal status: New   2. Patient will demonstrate independent use of home exercise program to facilitate ability to maintain/progress functional gains from skilled physical therapy services.  Goal status: New   3. Patient will demonstrate Patient specific functional scale avg > or = *** to indicate reduced disability due to condition.   Goal status: New   4. Patient will demonstrate lumbar extension 100 % WFL s symptoms to facilitate upright standing, walking posture at PLOF s limitation.  Goal status: New   5.  ***  Goal status: New   6.  *** Goal status: New     PLAN:  PT FREQUENCY: 1-2x/week  PT DURATION: 10 weeks  PLANNED INTERVENTIONS: Can include 02853- PT Re-evaluation, 97110-Therapeutic exercises, 97530- Therapeutic activity, [id number]- Neuromuscular re-education, 97535- Self Care, 97140- Manual therapy, [phone]- Gait training, [phone]- Orthotic Fit/training, [phone]- Canalith repositioning, [id number]- Aquatic Therapy, [phone]- Electrical stimulation (unattended), [id number] Physical performance testing, 97016- Vasopneumatic device, [id number]- Ultrasound, [id number]- Traction (mechanical), [id number]- Ionotophoresis 4mg /ml Dexamethasone,  20560 - Needle insertion w/o injection 1 or 2 muscles, 20561 - Needle insertion w/o injection 3 or more muscles.    Patient/Family education, Balance training, Stair training, Taping, Dry Needling, Joint mobilization, Joint manipulation, Spinal manipulation, Spinal mobilization, Scar mobilization, Vestibular training, Visual/preceptual remediation/compensation,  DME instructions, Cryotherapy, and Moist heat.  All performed as medically necessary.  All included unless contraindicated  PLAN FOR NEXT SESSION: Review HEP knowledge/results. Core  strenghtening         Delon JONELLE Lunger, PT, MPT 10/20/2023, 9:24 AM

## 2023-10-27 ENCOUNTER — Encounter: Admitting: Orthopedic Surgery

## 2023-11-10 ENCOUNTER — Encounter: Admitting: Orthopedic Surgery

## 2023-11-18 ENCOUNTER — Ambulatory Visit: Admitting: Physical Therapy

## 2023-11-18 NOTE — Therapy (Incomplete)
 OUTPATIENT PHYSICAL THERAPY EVALUATION   Patient Name: Maria Sharp MRN: [id number] DOB:02/12/1966, 58 y.o., female Today's Date: 11/18/2023  END OF SESSION:   Past Medical History:  Diagnosis Date   Allergy    Asthma    Diabetes mellitus    Fatty liver    Gastritis    Gastroparesis    study 11 14  nl abd us    GERD (gastroesophageal reflux disease)    Gout    High cholesterol    History of varicella    Hypertension    IBS (irritable bowel syndrome)    Rheumatoid arthritis (HCC)    Past Surgical History:  Procedure Laterality Date   ANAL RECTAL MANOMETRY N/A 04/03/2015   Procedure: ANO RECTAL MANOMETRY;  Surgeon: Gustav Shila GAILS, MD;  Location: [facility];  Service: Endoscopy;  Laterality: N/A;   CHOLECYSTECTOMY     EYE SURGERY     REFRACTIVE SURGERY Right    WISDOM TOOTH EXTRACTION     Patient Active Problem List   Diagnosis Date Noted   Precordial chest pain 12/26/2022   Positive ANA (antinuclear antibody) 07/01/2022   Polyarthralgia 07/01/2022   Circadian rhythm sleep disorder, shift work type 10/03/2021   Cyst of tendon sheath 10/03/2021   Hypercalcemia 10/03/2021   Allergic rhinitis 02/01/2021   Allergic rhinitis due to animal (cat) (dog) hair and dander 02/01/2021   Allergic rhinitis due to pollen 02/01/2021   Chronic allergic conjunctivitis 02/01/2021   Mild persistent asthma, uncomplicated 02/01/2021   Seafood allergy 02/01/2021   Exposure to COVID-19 virus 08/26/2018   Migraines 09/02/2017   DJD (degenerative joint disease) 09/02/2017   Vertigo 12/05/2016   Bilateral temporomandibular joint pain 12/05/2016   Constipation    Asthma, chronic 09/28/2014   Gastroparesis    Varicose veins 09/14/2013   Neck pain on left side 06/14/2013   Current non-adherence to medical treatment 06/14/2013   Vitamin D  deficiency 10/05/2012   Leg cramps 10/05/2012   Family history of diabetes mellitus 10/05/2012   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    Abdominal pain 05/18/2010   CHEST PAIN, INTERMITTENT 05/12/2009   Obesity 04/05/2009   CARDIOMEGALY 04/05/2009   PALPITATIONS 04/05/2009   Type 2 diabetes mellitus with peripheral circulatory disorder (HCC) 03/15/2009   Backache 03/09/2008   Hyperlipidemia LDL goal <100 03/03/2008   Essential hypertension 03/03/2008   GERD 03/03/2008   Irritable bowel syndrome with both constipation and diarrhea 03/03/2008   Rectal bleeding 03/03/2008    PCP: Verena Mems, MD  REFERRING PROVIDER: Addie Cordella Hamilton, MD  REFERRING DIAG: M54.16 (ICD-10-CM) - Lumbar radiculopathy  Rationale for Evaluation and Treatment: Rehabilitation  THERAPY DIAG:  No diagnosis found.  ONSET DATE: ***   SUBJECTIVE:  SUBJECTIVE STATEMENT: ***  PERTINENT HISTORY:  Asthma, DM, gout, HTN, RA  PAIN:  Are you having pain? Yes: NPRS scale: *** Pain location: *** Pain description: *** Aggravating factors: *** Relieving factors: ***  PRECAUTIONS:  None  RED FLAGS: {PT Red Flags:29287}   WEIGHT BEARING RESTRICTIONS:  No  FALLS:  Has patient fallen in last 6 months? {fallsyesno:27318}  LIVING ENVIRONMENT: Lives with: {[facility] lives with:25569::lives with their family} Lives in: {Lives in:25570} Stairs: {opstairs:27293} Has following equipment at home: {Assistive devices:23999}  OCCUPATION:  ***  PLOF:  {PLOF:24004}  PATIENT GOALS:  ***   OBJECTIVE:  Note: Objective measures were completed at Evaluation unless otherwise noted.  DIAGNOSTIC FINDINGS:  MRI:  IMPRESSION: 1. At L4-5 there is a moderate disc bulge with a broad right subarticular disc protrusion with mass effect on the right intraspinal L5 nerve root. Mild bilateral facet arthropathy. Mild central canal stenosis. 2. No acute osseous  injury of the lumbar spine.   PATIENT SURVEYS:  Patient-Specific Activity Scoring Scheme  0 represents "unable to perform." 10 represents "able to perform at prior level. 0 1 2 3 4 5 6 7 8 9  10 (Date and Score)   Activity Eval     1. ***      2. ***      3. ***    4.    5.    Score ***    Total score = sum of the activity scores/number of activities Minimum detectable change (90%CI) for average score = 2 points Minimum detectable change (90%CI) for single activity score = 3 points    COGNITIVE STATUS: {cognition:24006}   SENSATION: {sensation:27233}  POSTURE:  {posture:25561}   GAIT: 11/18/23 Distance walked: *** Assistive device utilized: {Assistive devices:23999} Level of assistance: {Levels of assistance:24026} Comments: ***   PALPATION: 11/18/23 ***   LUMBAR ROM:   ROM A/PROM  eval  Flexion   Extension   Right lateral flexion   Left lateral flexion   Right rotation   Left rotation    (Blank rows = not tested)   LOWER EXTREMITY ROM:     ROM Right eval Left eval  Hip flexion    Hip extension    Hip abduction    Hip adduction    Hip internal rotation    Hip external rotation    Knee flexion    Knee extension    Ankle dorsiflexion    Ankle plantarflexion    Ankle inversion    Ankle eversion     (Blank rows = not tested)   LOWER EXTREMITY MMT:    MMT Right eval Left eval  Hip flexion    Hip extension    Hip abduction    Hip adduction    Hip internal rotation    Hip external rotation    Knee flexion    Knee extension    Ankle dorsiflexion    Ankle plantarflexion    Ankle inversion    Ankle eversion     (Blank rows = not tested)    SPECIAL TESTS:  11/18/23 {PT Special Tests:29286}  FUNCTIONAL TESTS:  11/18/23 {Functional tests:24029}   TREATMENT:  DATE:  11/18/23 TherEx See HEP -  demonstrated with trial reps performed PRN, *** cues for comprehension    Self Care ****     PATIENT EDUCATION:  Education details: HEP, *** Person educated: {Person educated:25204} Education method: Explanation, Demonstration, and Handouts Education comprehension: verbalized understanding, returned demonstration, and needs further education  HOME EXERCISE PROGRAM: ***   ASSESSMENT:  CLINICAL IMPRESSION: Patient is a 58 y.o. female who was seen today for physical therapy evaluation and treatment for LBP. She demonstrates *** affecting functional mobility.  She will benefit from PT to address deficits listed.     OBJECTIVE IMPAIRMENTS: {opptimpairments:25111}.   ACTIVITY LIMITATIONS: {activitylimitations:27494}  PARTICIPATION LIMITATIONS: {participationrestrictions:25113}  PERSONAL FACTORS: Age, Behavior pattern, Past/current experiences, Time since onset of injury/illness/exacerbation, and 3+ comorbidities: *** are also affecting patient's functional outcome.   REHAB POTENTIAL: {rehabpotential:25112}  CLINICAL DECISION MAKING: {clinical decision making:25114}  EVALUATION COMPLEXITY: {Evaluation complexity:25115}   GOALS: Goals reviewed with patient? Yes  SHORT TERM GOALS: Target date: ***  Independent with initial HEP Goal status: INITIAL  2.  *** Goal status: INITIAL  3.  *** Goal status: INITIAL   LONG TERM GOALS: Target date: ***  Independent with final HEP Goal status: INITIAL  2.  PSFS score improved by *** points Goal status: INITIAL  3.  *** improved to *** for *** Goal status: INIITAL  4.  Report pain < ***/10 with *** for improved function Goal status: INITIAL  5.  *** Goal status: INITIAL  6.  *** Goal status: INITIAL     PLAN:  PT FREQUENCY: {rehab frequency:25116}  PT DURATION: {rehab duration:25117}  PLANNED INTERVENTIONS: {rehab planned interventions:25118::97110-Therapeutic exercises,97530- Therapeutic  [phone]- Neuromuscular re-education,97535- Self Rjmz,02859- Manual therapy}.  PLAN FOR NEXT SESSION: Review HEP, ***   NEXT MD VISIT: Dr. Georgina 12/01/23   Corean JULIANNA Ku, PT 11/18/2023, 7:43 AM

## 2023-11-24 ENCOUNTER — Ambulatory Visit (INDEPENDENT_AMBULATORY_CARE_PROVIDER_SITE_OTHER): Admitting: Podiatry

## 2023-11-24 DIAGNOSIS — Z91199 Patient's noncompliance with other medical treatment and regimen due to unspecified reason: Secondary | ICD-10-CM

## 2023-11-24 NOTE — Progress Notes (Signed)
 1. Failure to attend appointment without reason given    Patient called to reschedule appointment.

## 2023-12-01 ENCOUNTER — Encounter: Admitting: Orthopedic Surgery

## 2023-12-11 ENCOUNTER — Ambulatory Visit: Admitting: Podiatry

## 2023-12-11 DIAGNOSIS — Z91199 Patient's noncompliance with other medical treatment and regimen due to unspecified reason: Secondary | ICD-10-CM

## 2023-12-11 NOTE — Progress Notes (Signed)
 1. Failure to attend appointment without reason given   Patient has suffered loss of her husband. Will call to schedule when she is ready.

## 2023-12-12 ENCOUNTER — Telehealth: Payer: Self-pay | Admitting: Orthopedic Surgery

## 2023-12-12 NOTE — Telephone Encounter (Signed)
 Pt called asking for a referral to be sent to Dorn Ned MD Neuro surgeon. Pt phone number is [phone].

## 2023-12-15 ENCOUNTER — Telehealth: Payer: Self-pay | Admitting: Orthopedic Surgery

## 2023-12-15 NOTE — Telephone Encounter (Signed)
 Pt called asking for a referral to be sent to Dorn Ned MD Neuro surgeon. Pt phone number is [phone].

## 2023-12-16 ENCOUNTER — Other Ambulatory Visit: Payer: Self-pay | Admitting: Radiology

## 2023-12-16 DIAGNOSIS — M5416 Radiculopathy, lumbar region: Secondary | ICD-10-CM

## 2023-12-16 NOTE — Telephone Encounter (Signed)
 Placed referral

## 2023-12-16 NOTE — Telephone Encounter (Signed)
 duplicate

## 2023-12-16 NOTE — Telephone Encounter (Signed)
 Sure 6thx

## 2023-12-16 NOTE — Telephone Encounter (Signed)
 Pls refer thx

## 2024-01-01 ENCOUNTER — Ambulatory Visit: Admitting: Podiatry

## 2024-01-01 ENCOUNTER — Encounter: Payer: Self-pay | Admitting: Podiatry

## 2024-01-01 DIAGNOSIS — B351 Tinea unguium: Secondary | ICD-10-CM

## 2024-01-01 DIAGNOSIS — M79609 Pain in unspecified limb: Secondary | ICD-10-CM | POA: Diagnosis not present

## 2024-01-01 DIAGNOSIS — M2141 Flat foot [pes planus] (acquired), right foot: Secondary | ICD-10-CM

## 2024-01-01 DIAGNOSIS — M2042 Other hammer toe(s) (acquired), left foot: Secondary | ICD-10-CM | POA: Diagnosis not present

## 2024-01-01 DIAGNOSIS — E119 Type 2 diabetes mellitus without complications: Secondary | ICD-10-CM

## 2024-01-01 DIAGNOSIS — E1151 Type 2 diabetes mellitus with diabetic peripheral angiopathy without gangrene: Secondary | ICD-10-CM

## 2024-01-01 DIAGNOSIS — J300 Vasomotor rhinitis: Secondary | ICD-10-CM | POA: Insufficient documentation

## 2024-01-01 DIAGNOSIS — M2142 Flat foot [pes planus] (acquired), left foot: Secondary | ICD-10-CM

## 2024-01-04 ENCOUNTER — Encounter: Payer: Self-pay | Admitting: Podiatry

## 2024-01-04 NOTE — Progress Notes (Signed)
  Subjective:  Patient ID: Maria Sharp, female    DOB: 27-Dec-1965,  MRN: [id number]  Maria Sharp presents to clinic today for for annual diabetic foot examination and painful mycotic toenails of both feet that are difficult to trim. Pain interferes with daily activities and wearing enclosed shoe gear comfortably. She is grieving the loss of her husband. Chief Complaint  Patient presents with   [employer]    Rm1 Diabetic foot care/ Dr. Reena Fujita A1c 7   New problem(s): None.   PCP is Verena Reena, MD.  Allergies  Allergen Reactions   Ace Inhibitors Anaphylaxis    Other reaction(s): Not available   Shellfish Allergy Anaphylaxis and Other (See Comments)    Only shrimp   Januvia [Sitagliptin] Other (See Comments)    Recurrent abdominal pain with elevated pancreas enzymes   Latex Itching   Lipitor [Atorvastatin]    Shellfish-Derived Products     Other reaction(s): Not available   Shrimp (Diagnostic)     Other Reaction(s): Unknown   Triamcinolone  Acetonide Other (See Comments)    Review of Systems: Negative except as noted in the HPI.  Objective: No changes noted in today's physical examination. There were no vitals filed for this visit. Maria Sharp is a pleasant 58 y.o. female in NAD. AAO x 3.  Vascular Examination: CFT <3 seconds b/l. DP/PT pulses faintly palpable b/l. Pedal edema absent. Skin temperature gradient warm to warm b/l. Digital hair present. No pain with calf compression. No ischemia or gangrene. No cyanosis or clubbing noted b/l. Trace edema noted BLE.   Neurological Examination: Sensation grossly intact b/l with 10 gram monofilament. Vibratory sensation intact b/l.   Dermatological Examination: Pedal skin warm and supple b/l.   No open wounds. No interdigital macerations.  Toenails 1-5 b/l thick, discolored, elongated with subungual debris and pain on dorsal palpation.    No corns, calluses, nor porokeratotic lesions.  Musculoskeletal  Examination: Muscle strength 5/5 to all lower extremity muscle groups bilaterally. Hammertoe(s) b/l 5th toes. Pes planus deformity noted bilateral LE.  Radiographs: None  Assessment/Plan: 1. Pain due to onychomycosis of nail   2. Pes planus of both feet   3. Hammertoe of left foot   4. Type II diabetes mellitus with peripheral circulatory disorder (HCC)   5. Encounter for diabetic foot exam (HCC)   Diabetic foot examination performed today. All patient's and/or POA's questions/concerns addressed on today's visit. Toenails 1-5 debrided in length and girth without incident. Continue foot and shoe inspections daily. Monitor blood glucose per PCP/Endocrinologist's recommendations. Continue soft, supportive shoe gear daily. Report any pedal injuries to medical professional. Call office if there are any questions/concerns. -Patient/POA to call should there be question/concern in the interim.   Return in about 9 weeks (around 03/04/2024).  Delon LITTIE Merlin, DPM      [place] LOCATION: [address] [place] [address]                   Office [phone]   [facility] LOCATION: [address] [place], [place] [address] Office [phone]

## 2024-01-19 ENCOUNTER — Telehealth: Payer: Self-pay

## 2024-01-19 NOTE — Telephone Encounter (Signed)
 Advice:  -pt called wanting to get an appt b/c she has a knot on the side of her leg that wasn't there when she had a shot in her back.   -she states she was told to call her PCP but she can't b/c she does not have one, she retired and will have a new one in about 3 weeks.  -She states that she was just at rehab and the therapist told her that her leg would be hot, swollen, painful in her calf if she had a DVT.  She says she takes an aspirin  everyday and is wearing her compression hose. -pt states she is going to call her podiatrist and maybe she will put a referral in for her.   -I advised pt that is she thought she had a clot it needs to be checked out right away, that getting a referral is more of a longer process.  Advised pt to have a doctor look at it to discern whether she needs an ultrasound, a doctor visit, etc., depending on what they see.

## 2024-02-09 ENCOUNTER — Ambulatory Visit: Admitting: Family Medicine

## 2024-02-12 ENCOUNTER — Encounter: Payer: Self-pay | Admitting: Family Medicine

## 2024-02-12 ENCOUNTER — Ambulatory Visit: Admitting: Family Medicine

## 2024-02-12 VITALS — BP 128/60 | HR 87 | Temp 98.4°F | Ht 70.0 in | Wt 234.8 lb

## 2024-02-12 DIAGNOSIS — E1169 Type 2 diabetes mellitus with other specified complication: Secondary | ICD-10-CM | POA: Diagnosis not present

## 2024-02-12 DIAGNOSIS — E1151 Type 2 diabetes mellitus with diabetic peripheral angiopathy without gangrene: Secondary | ICD-10-CM

## 2024-02-12 DIAGNOSIS — M0579 Rheumatoid arthritis with rheumatoid factor of multiple sites without organ or systems involvement: Secondary | ICD-10-CM

## 2024-02-12 DIAGNOSIS — R350 Frequency of micturition: Secondary | ICD-10-CM

## 2024-02-12 DIAGNOSIS — R300 Dysuria: Secondary | ICD-10-CM

## 2024-02-12 DIAGNOSIS — E538 Deficiency of other specified B group vitamins: Secondary | ICD-10-CM

## 2024-02-12 DIAGNOSIS — M4726 Other spondylosis with radiculopathy, lumbar region: Secondary | ICD-10-CM

## 2024-02-12 DIAGNOSIS — I152 Hypertension secondary to endocrine disorders: Secondary | ICD-10-CM

## 2024-02-12 DIAGNOSIS — E1159 Type 2 diabetes mellitus with other circulatory complications: Secondary | ICD-10-CM

## 2024-02-12 DIAGNOSIS — Z23 Encounter for immunization: Secondary | ICD-10-CM

## 2024-02-12 DIAGNOSIS — E559 Vitamin D deficiency, unspecified: Secondary | ICD-10-CM

## 2024-02-12 DIAGNOSIS — E785 Hyperlipidemia, unspecified: Secondary | ICD-10-CM

## 2024-02-12 LAB — POCT URINALYSIS DIPSTICK
Bilirubin, UA: NEGATIVE
Blood, UA: NEGATIVE
Glucose, UA: NEGATIVE
Ketones, UA: POSITIVE
Leukocytes, UA: NEGATIVE
Nitrite, UA: NEGATIVE
Odor: NORMAL
Protein, UA: NEGATIVE
Spec Grav, UA: 1.01 (ref 1.010–1.025)
Urobilinogen, UA: 0.2 U/dL
pH, UA: 6 (ref 5.0–8.0)

## 2024-02-12 MED ORDER — RYBELSUS 14 MG PO TABS: 1.0000 | ORAL_TABLET | Freq: Every day | ORAL | 1 refills | Status: AC

## 2024-02-12 MED ORDER — GLUCOSE BLOOD VI STRP: ORAL_STRIP | 2 refills | Status: AC

## 2024-02-12 MED ORDER — BLOOD GLUCOSE MONITOR KIT: PACK | 0 refills | Status: AC

## 2024-02-12 MED ORDER — ROSUVASTATIN CALCIUM 5 MG PO TABS: 2.5000 mg | ORAL_TABLET | ORAL | 1 refills | Status: AC

## 2024-02-12 MED ORDER — SPIRONOLACTONE 25 MG PO TABS: 25.0000 mg | ORAL_TABLET | Freq: Every day | ORAL | 1 refills | Status: AC

## 2024-02-12 MED ORDER — PHENAZOPYRIDINE HCL 200 MG PO TABS: 200.0000 mg | ORAL_TABLET | Freq: Three times a day (TID) | ORAL | 0 refills | Status: AC | PRN

## 2024-02-12 MED ORDER — AMLODIPINE-OLMESARTAN 10-40 MG PO TABS: 1.0000 | ORAL_TABLET | Freq: Every day | ORAL | 2 refills | Status: AC

## 2024-02-12 MED ORDER — ONETOUCH DELICA PLUS LANCET33G MISC: 1 refills | Status: AC

## 2024-02-12 NOTE — Progress Notes (Signed)
 "   New patient visit   Patient: Maria Sharp   DOB: 1966/02/05   58 y.o. Female  MRN: [id number] Visit Date: 02/12/2024  Today's healthcare provider: Rockie Agent, MD   Chief Complaint  Patient presents with   Establish Care    Patient is present to establish care with new pcp.  Diet exercise  Vaccines: Flu, hep B, shingles- Screenings: Mammogram, colonoscopy, Pap, DM eye exam    Urinary Frequency    Patient reports urinary frequency and lower abd pain x 2 weeks   Eye Problem    Patient reports that she has woken up for the past week with her left eye swollen underneath. No drainage or pain just swelling     Subjective    Maria Sharp is a 58 y.o. female who presents today as a new patient to establish care.   HPI     Establish Care    Additional comments: Patient is present to establish care with new pcp.  Diet exercise  Vaccines: Flu, hep B, shingles- Screenings: Mammogram, colonoscopy, Pap, DM eye exam         Urinary Frequency    Additional comments: Patient reports urinary frequency and lower abd pain x 2 weeks        Eye Problem    Additional comments: Patient reports that she has woken up for the past week with her left eye swollen underneath. No drainage or pain just swelling        Last edited by Cherry Chiquita HERO, CMA on 02/12/2024 11:17 AM.       Discussed the use of AI scribe software for clinical note transcription with the patient, who gave verbal consent to proceed.  History of Present Illness Maria Sharp is a 58 year old female with type 2 diabetes, hypertension, and chronic asthma who presents to establish care as a new patient.  She has type 2 diabetes complicated by peripheral circulatory disorder, managed with Rybelsus  14 mg daily. Recent weight loss since her husband's passing in August has affected her eating habits. She recently had ketones detected in her urine. She is experiencing urinary frequency and lower  abdominal pain for the past week, with no hematuria and a history of only two urinary tract infections.  She has hypertension associated with diabetes and takes amlodipine  10 mg, olmesartan  40 mg daily, and spironolactone  25 mg once daily.  She has chronic asthma, using albuterol  as needed, though not recently. She also uses Arnuity Ellipta 200 mcg daily and Flovent 110 mcg for asthma control. She sees Dr. Frutoso for allergies and asthma management.  She has allergic rhinitis and uses Azelastine  0.05% eye drops. She is allergic to ACE inhibitors, shellfish, Januvia, latex, Lipitor, shrimp, and triamcinolone , and carries an EpiPen for severe allergic reactions.  She has gastroparesis and takes Bentyl  20 mg three times daily before meals, though she is unsure if she is still taking it. She also takes omeprazole  for GERD management.  She has hyperlipidemia and takes pravastatin  40 mg twice daily and Crestor  2.5 mg every other day. She takes Arava 20 mg and reports rheumatoid arthritis symptoms in her hands, knees, and ankles.  She reports a history of cardiomegaly and has not seen a cardiologist recently. She mentions a past irregular heartbeat and a vascular issue in her leg, which she is hesitant to address due to insurance concerns.  She has vitamin D  and B12 deficiencies, taking vitamin D3 1000 units daily and B12 1000  mcg daily. She is undergoing grief counseling following her husband's death, impacting her daily routine and eating habits.  She works night shifts, leading to a disrupted eating schedule, often eating late at night or early in the morning. She has a strong family support system, particularly from her in-laws, following her husband's passing.      Past Medical History:  Diagnosis Date   Allergy    Asthma    Diabetes mellitus    Fatty liver    Gastritis    Gastroparesis    study 11 14  nl abd us    GERD (gastroesophageal reflux disease)    Gout    High cholesterol     History of varicella    Hypertension    IBS (irritable bowel syndrome)    Rheumatoid arthritis (HCC)     Outpatient Medications Prior to Visit  Medication Sig   albuterol  (PROVENTIL  HFA;VENTOLIN  HFA) 108 (90 Base) MCG/ACT inhaler    ARNUITY ELLIPTA 200 MCG/ACT AEPB 1 puff Inhalation Once a day for 30 days   aspirin  EC 81 MG tablet Take 81 mg by mouth 3 (three) times a week. Swallow whole.   azelastine  (OPTIVAR ) 0.05 % ophthalmic solution Place 1 drop into both eyes 2 (two) times daily. (Patient taking differently: Place 1 drop into both eyes daily as needed.)   cholecalciferol (VITAMIN D3) 25 MCG (1000 UNIT) tablet Take 1,000 Units by mouth daily.   dicyclomine  (BENTYL ) 20 MG tablet Take 1 tablet (20 mg total) by mouth 3 (three) times daily before meals. (Patient taking differently: Take 20 mg by mouth as needed.)   EPINEPHrine (EPI-PEN) 0.3 mg/0.3 mL DEVI Inject 0.3 mg into the muscle once as needed. For severe allergic reaction   FLOVENT HFA 110 MCG/ACT inhaler Inhale 2 puffs into the lungs 2 (two) times daily. (Patient taking differently: Inhale 2 puffs into the lungs 2 (two) times daily as needed.)   furosemide (LASIX) 20 MG tablet Take 20 mg by mouth daily as needed for edema.   leflunomide (ARAVA) 20 MG tablet Take 20 mg by mouth daily.   montelukast (SINGULAIR) 10 MG tablet 1 tablet in the evening   Multiple Vitamins-Minerals (ALIVE WOMENS 50+) TABS See admin instructions.   Multiple Vitamins-Minerals (HAIR VITAMINS PO) Take by mouth. Vivastol (Patient taking differently: Take 1 tablet by mouth daily as needed. Vivastol)   naproxen sodium (ALEVE) 220 MG tablet Take 440 mg by mouth as needed.   omeprazole  (PRILOSEC) 40 MG capsule TAKE 1 CAPSULE (40 MG TOTAL) BY MOUTH IN THE MORNING AND AT BEDTIME.   pregabalin (LYRICA) 75 MG capsule Take 75 mg by mouth daily.   triamcinolone  cream (KENALOG ) 0.1 % Apply 1 Application topically 2 (two) times daily. (Patient taking differently: Apply 1  Application topically as needed.)   vitamin B-12 (CYANOCOBALAMIN) 1000 MCG tablet Take 1,000 mcg by mouth daily.   vitamin E 400 UNIT capsule Take 400 Units by mouth daily.   Zinc 100 MG TABS 1 tablet   [DISCONTINUED] amLODipine -olmesartan  (AZOR ) 10-40 MG tablet Take 1 tablet by mouth daily.   [DISCONTINUED] glucose blood test strip USE TO TEST 4 TIMES A DAY   [DISCONTINUED] Lancets (ONETOUCH DELICA PLUS LANCET33G) MISC USE TO TEST UP TO 4 TIMES A DAY   [DISCONTINUED] pantoprazole  (PROTONIX ) 40 MG tablet TAKE 1 TABLET BY MOUTH EVERY DAY (Patient taking differently: Take 40 mg by mouth daily as needed.)   [DISCONTINUED] rosuvastatin  (CRESTOR ) 5 MG tablet Take 0.5 tablets (2.5 mg total) by  mouth every other day.   [DISCONTINUED] RYBELSUS  14 MG TABS Take 1 tablet by mouth daily.   [DISCONTINUED] Simethicone  80 MG TABS Take 1 tablet (80 mg total) by mouth 3 (three) times daily. (Patient taking differently: Take 1 tablet by mouth daily as needed.)   [DISCONTINUED] spironolactone  (ALDACTONE ) 25 MG tablet Take 1 tablet (25 mg total) by mouth 2 (two) times daily. (Patient taking differently: Take 25 mg by mouth daily.)   [DISCONTINUED] AMBULATORY NON FORMULARY MEDICATION Medication Name: Diltiazem  gel 2% use small pea sized amount three times a day per rectum   [DISCONTINUED] blood glucose meter kit and supplies KIT Dispense based on patient and insurance preference. Use up to four times daily as directed. (FOR ICD-9 250.00, 250.01).   [DISCONTINUED] celecoxib  (CELEBREX ) 100 MG capsule Take 100 mg by mouth as needed.   [DISCONTINUED] diltiazem  2 % GEL apply small pea-sized amount per rectum 3 times daily for 2 months   [DISCONTINUED] naproxen (NAPROSYN) 500 MG tablet Take 500 mg by mouth 2 (two) times daily with a meal.   [DISCONTINUED] nystatin cream (MYCOSTATIN) APPLY TO THE AFFECTED AREA(S) BY TOPICAL ROUTE 2 TIMES PER DAY   [DISCONTINUED] methylPREDNISolone  acetate (DEPO-MEDROL ) injection 40 mg    No  facility-administered medications prior to visit.    Past Surgical History:  Procedure Laterality Date   ANAL RECTAL MANOMETRY N/A 04/03/2015   Procedure: ANO RECTAL MANOMETRY;  Surgeon: Gustav Shila GAILS, MD;  Location: [facility];  Service: Endoscopy;  Laterality: N/A;   CHOLECYSTECTOMY     EYE SURGERY     REFRACTIVE SURGERY Right    WISDOM TOOTH EXTRACTION     Family Status  Relation Name Status   Mother  Deceased   Father  Alive   Sister  Alive   Brother  Alive   MGM  Deceased   MGF  Deceased   PGM  Deceased   PGF  Deceased   Mat Uncle  Deceased   Neg Hx  (Not Specified)  No partnership data on file   Family History  Problem Relation Age of Onset   Diabetes Mother    Heart disease Mother    Arthritis Mother    Hyperlipidemia Mother    Hypertension Mother    Parkinson's disease Mother    Thyroid disease Mother    Osteoarthritis Mother    Prostate cancer Father    Diabetes Father    Hypertension Father    Rheum arthritis Father    Arthritis Sister    Hyperlipidemia Maternal Grandmother    Hypertension Maternal Grandmother    Hyperlipidemia Maternal Grandfather    Hypertension Maternal Grandfather    Diabetes Paternal Grandmother    Arthritis Paternal Grandmother    Hyperlipidemia Paternal Grandmother    Heart disease Paternal Grandmother    Dementia Paternal Grandmother    Hypertension Paternal Grandmother    Hyperlipidemia Paternal Grandfather    Hypertension Paternal Grandfather    Diabetes Paternal Grandfather    Cholelithiasis Paternal Grandfather    Sickle cell trait Maternal Uncle    Colon cancer Neg Hx    Esophageal cancer Neg Hx    Stomach cancer Neg Hx    Rectal cancer Neg Hx    Social History   Socioeconomic History   Marital status: Widowed    Spouse name: Not on file   Number of children: 0   Years of education: Not on file   Highest education level: Not on file  Occupational History   Occupation: [employer]  Tobacco Use    Smoking status: Never    Passive exposure: Past   Smokeless tobacco: Never  Vaping Use   Vaping status: Never Used  Substance and Sexual Activity   Alcohol use: No   Drug use: No   Sexual activity: Not on file  Other Topics Concern   Not on file  Social History Narrative   Not on file   Social Drivers of Health   Financial Resource Strain: Not on file  Food Insecurity: Not on file  Transportation Needs: Not on file  Physical Activity: Not on file  Stress: Not on file  Social Connections: Unknown (10/06/2022)   Received [facility]   Social Network    Social Network: Not on file     Allergies  Allergen Reactions   Ace Inhibitors Anaphylaxis    Other reaction(s): Not available   Shellfish Allergy Anaphylaxis and Other (See Comments)    Only shrimp   Januvia [Sitagliptin] Other (See Comments)    Recurrent abdominal pain with elevated pancreas enzymes   Latex Itching   Lipitor [Atorvastatin]    Shellfish Protein-Containing Drug Products     Other reaction(s): Not available   Shrimp (Diagnostic)     Other Reaction(s): Unknown   Triamcinolone  Acetonide Other (See Comments)    Immunization History  Administered Date(s) Administered   Fluad Trivalent(High Dose 65+) 02/11/2011   INFLUENZA, HIGH DOSE SEASONAL PF 03/16/2018, 01/22/2021   Influenza, Seasonal, Injecte, Preservative Fre 02/10/2023   Influenza,inj,Quad PF,6+ Mos 02/08/2013, 02/07/2014, 02/17/2017, 03/02/2018, 01/28/2022, 03/12/2022   Influenza-Unspecified 02/07/2014, 01/08/2021   Measles 05/19/1987   PFIZER(Purple Top)SARS-COV-2 Vaccination 06/28/2019, 07/26/2019, 11/29/2019, 02/24/2020   Pneumococcal Conjugate-13 02/08/2013   Pneumococcal Polysaccharide-23 12/20/2013, 02/14/2016, 01/21/2017, 03/16/2018, 01/22/2021   Pneumococcal-Unspecified 04/22/2004   Tdap 01/01/2012, 06/14/2013, 03/19/2021    Health Maintenance  Topic Date Due   OPHTHALMOLOGY EXAM  01/13/2019   Colonoscopy  03/27/2023    Influenza Vaccine  11/21/2023   COVID-19 Vaccine (5 - 2025-26 season) 02/28/2024 (Originally 12/22/2023)   Zoster Vaccines- Shingrix (1 of 2) 05/14/2024 (Originally 03/11/1985)   HEMOGLOBIN A1C  08/12/2024   FOOT EXAM  12/31/2024   Diabetic kidney evaluation - eGFR measurement  02/11/2025   Diabetic kidney evaluation - Urine ACR  02/11/2025   Mammogram  07/01/2025   Pneumococcal Vaccine: 50+ Years (3 of 3 - PCV20 or PCV21) 01/22/2026   Cervical Cancer Screening (HPV/Pap Cotest)  07/06/2028   DTaP/Tdap/Td (4 - Td or Tdap) 03/20/2031   Hepatitis C Screening  Completed   HIV Screening  Completed   HPV VACCINES  Aged Out   Meningococcal B Vaccine  Aged Out   Hepatitis B Vaccines 19-59 Average Risk  Discontinued    Patient Care Team: Sharma Coyer, MD as PCP - General (Family Medicine) Lavona Agent, MD as PCP - Cardiology (Cardiology) Frutoso Luz, MD (Allergy) Robinson Idol, MD as Attending Physician (Ophthalmology) Magdalen Pasco RAMAN, DPM as Consulting Physician (Podiatry) Addie, Cordella Hamilton, MD (Orthopedic Surgery) Lavona Agent, MD as Attending Physician (Cardiology) Cary Doffing, MD as Attending Physician (Dermatology) Shila Gustav GAILS, MD as Consulting Physician (Gastroenterology) Rox Charleston, MD as Consulting Physician (Obstetrics and Gynecology) Arlana Arnt, MD as Consulting Physician (Otolaryngology)  Review of Systems      Objective    BP 128/60 (BP Location: Right Arm, Patient Position: Sitting, Cuff Size: Normal)   Pulse 87   Temp 98.4 F (36.9 C) (Oral)   Ht 5' 10 (1.778 m)   Wt 234 lb 12.8  oz (106.5 kg)   LMP 01/13/2018   SpO2 100%   BMI 33.69 kg/m      Depression Screen    02/12/2024   11:01 AM 08/26/2018   10:48 AM 03/10/2018   12:06 PM 03/02/2018   10:28 AM  PHQ 2/9 Scores  PHQ - 2 Score  0 0 0  Exception Documentation Patient refusal      Results for orders placed or performed in visit on 02/12/24  Comprehensive  metabolic panel with eGFR (no eGFR if sent to [facility]) [LAB17]  Result Value Ref Range   Glucose 98 70 - 99 mg/dL   BUN 12 6 - 24 mg/dL   Creatinine, Ser 9.18 0.57 - 1.00 mg/dL   eGFR 85 >40 fO/fpw/8.26   BUN/Creatinine Ratio 15 9 - 23   Sodium 140 134 - 144 mmol/L   Potassium 4.4 3.5 - 5.2 mmol/L   Chloride 102 96 - 106 mmol/L   CO2 24 20 - 29 mmol/L   Calcium  10.3 (H) 8.7 - 10.2 mg/dL   Total Protein 7.2 6.0 - 8.5 g/dL   Albumin 4.1 3.8 - 4.9 g/dL   Globulin, Total 3.1 1.5 - 4.5 g/dL   Bilirubin Total 0.5 0.0 - 1.2 mg/dL   Alkaline Phosphatase 66 49 - 135 IU/L   AST 13 0 - 40 IU/L   ALT 14 0 - 32 IU/L  Urine Albumin/Creatinine with ratio (send out) [LAB689]  Result Value Ref Range   Creatinine, Urine 97.2 Not Estab. mg/dL   Microalbumin, Urine 5.7 Not Estab. ug/mL   Microalb/Creat Ratio 6 0 - 29 mg/g creat  HgB A1c  Result Value Ref Range   Hgb A1c MFr Bld 6.0 (H) 4.8 - 5.6 %   Est. average glucose Bld gHb Est-mCnc 126 mg/dL  Lipid panel  Result Value Ref Range   Cholesterol, Total 207 (H) 100 - 199 mg/dL   Triglycerides 50 0 - 149 mg/dL   HDL 70 >60 mg/dL   VLDL Cholesterol Cal 9 5 - 40 mg/dL   LDL Chol Calc (NIH) 871 (H) 0 - 99 mg/dL   Chol/HDL Ratio 3.0 0.0 - 4.4 ratio  Vitamin B12  Result Value Ref Range   Vitamin B-12 379 232 - 1,245 pg/mL  VITAMIN D  25 Hydroxy (Vit-D Deficiency, Fractures)  Result Value Ref Range   Vit D, 25-Hydroxy 32.8 30.0 - 100.0 ng/mL  POCT Urinalysis Dipstick  Result Value Ref Range   Color, UA yellow    Clarity, UA clear    Glucose, UA Negative Negative   Bilirubin, UA negative    Ketones, UA positive    Spec Grav, UA 1.010 1.010 - 1.025   Blood, UA negative    pH, UA 6.0 5.0 - 8.0   Protein, UA Negative Negative   Urobilinogen, UA 0.2 0.2 or 1.0 E.U./dL   Nitrite, UA negative    Leukocytes, UA Negative Negative   Appearance clear    Odor normal      Physical Exam Physical Exam VITALS: BP- 128/60 MEASUREMENTS: BMI-  33.69. ABDOMEN: No costovertebral angle tenderness CARD: RRR  PULM: no wheezing, no crackles       Assessment & Plan      Problem List Items Addressed This Visit     DJD (degenerative joint disease)   Relevant Medications   aspirin  EC 81 MG tablet   Hyperlipidemia LDL goal <100   Relevant Medications   aspirin  EC 81 MG tablet   amLODipine -olmesartan  (AZOR ) 10-40 MG tablet  rosuvastatin  (CRESTOR ) 5 MG tablet   spironolactone  (ALDACTONE ) 25 MG tablet   Hypertension associated with diabetes (HCC)   Relevant Medications   RYBELSUS  14 MG TABS   aspirin  EC 81 MG tablet   amLODipine -olmesartan  (AZOR ) 10-40 MG tablet   rosuvastatin  (CRESTOR ) 5 MG tablet   spironolactone  (ALDACTONE ) 25 MG tablet   Type 2 diabetes mellitus with peripheral circulatory disorder (HCC)   Relevant Medications   RYBELSUS  14 MG TABS   aspirin  EC 81 MG tablet   amLODipine -olmesartan  (AZOR ) 10-40 MG tablet   rosuvastatin  (CRESTOR ) 5 MG tablet   spironolactone  (ALDACTONE ) 25 MG tablet   Type II diabetes mellitus with peripheral circulatory disorder (HCC) - Primary   Relevant Medications   blood glucose meter kit and supplies KIT   glucose blood test strip   Lancets (ONETOUCH DELICA PLUS LANCET33G) MISC   RYBELSUS  14 MG TABS   aspirin  EC 81 MG tablet   amLODipine -olmesartan  (AZOR ) 10-40 MG tablet   rosuvastatin  (CRESTOR ) 5 MG tablet   spironolactone  (ALDACTONE ) 25 MG tablet   Other Relevant Orders   Comprehensive metabolic panel with eGFR (no eGFR if sent to [facility]) [LAB17] (Completed)   Urine Albumin/Creatinine with ratio (send out) [LAB689] (Completed)   HgB A1c (Completed)   Vitamin D  deficiency   Relevant Orders   VITAMIN D  25 Hydroxy (Vit-D Deficiency, Fractures) (Completed)   Other Visit Diagnoses       Urine frequency       Relevant Orders   POCT Urinalysis Dipstick (Completed)     Immunization due       Relevant Orders   Flu vaccine trivalent PF, 6mos and  older(Flulaval,Afluria,Fluarix,Fluzone)     B12 deficiency       Relevant Orders   Vitamin B12 (Completed)     Rheumatoid arthritis involving multiple sites with positive rheumatoid factor (HCC)       Relevant Medications   aspirin  EC 81 MG tablet     Dysuria       Relevant Medications   phenazopyridine  (PYRIDIUM ) 200 MG tablet       Assessment and Plan Assessment & Plan Type 2 Diabetes Mellitus with Diabetic Peripheral Angiopathy Chronic condition with peripheral circulatory disorder. Recent urinary frequency with ketones in urine, possibly due to fasting and stress-related eating habits. - Continue Rybelsus  14 mg daily - Order CMP, A1c, urine analysis, urine albumin and creatinine ratio - Reorder blood glucose meter and supplies  Frequency of Micturition Acute problem  Recent onset of urinary frequency with ketones in urine. Possible causes include fasting and stress. Pain at the bottom of the stomach. Differential includes cyst or local irritation. - Order urine culture - Prescribe Pyridium  200 mg three times daily as needed  Hypertension associated with Diabetes Chronic hypertension with current blood pressure at 128/60 mmHg. Managed with amlodipine  and olmesartan . - Continue amlodipine -olmesartan  10-40 mg daily - Order CMP  Hyperlipidemia associated with Diabetes Chronic condition managed with Crestor . - Continue Crestor  2.5 mg every other day  Obesity, Class 1 Chronic  BMI of 33.69. Recent weight loss likely related to stress and personal loss. -recommend continued physical activity to help with healthy weight management   Chronic Asthma Managed with inhalers. Reports not using albuterol  recently but wants to keep it available. - Continue albuterol  as needed - Continue Arnuity Ellipta 200 mcg daily - Continue Flovent 110 mcg  Allergic Rhinitis Chronic condition  Managed with azelastine  eye drops and montelukast as needed. - Continue azelastine  0.05% eye  drops -  Continue montelukast 10 mg as needed  Rheumatoid Arthritis involving Hands, Knees, and Ankles Chronic condition affecting hands, knees, and ankles. Managed with leflunomide. - Continue leflunomide 20 mg  Degenerative Joint Disease Chronic condition.  Vitamin D  Deficiency Chronic condition  Managed with vitamin D  supplementation. - Continue vitamin D3 1000 units daily  Vitamin B12 Deficiency Chronic condition  Prescribed vitamin B12 supplementation. Plan to measure B12 levels today. - Continue vitamin B12 1000 mcg daily - Order B12 level  Gastroparesis Chronic condition. Unsure if still taking Bentyl . - Continue Bentyl  20 mg three times daily before meals if still taking  Gastroesophageal Reflux Disease (GERD) Chronic condition  Managed with omeprazole . Does not take Protonix . - Continue omeprazole  40mg  daily   Cardiomegaly History of cardiomegaly. Pt follows with cardiology   Radiculopathy, Lumbar Region Chronic condition with bulging disc and pinched nerve. Managed with Lyrica and physical therapy. - Continue pregabalin 75 mg daily - Continue physical therapy      Return in about 3 months (around 05/14/2024) for CPE.      Rockie Agent, MD  [facility] [phone] (phone) [phone] (fax)  [facility] "

## 2024-02-12 NOTE — Patient Instructions (Signed)
 To keep you healthy, please keep in mind the following health maintenance items that you are due for:   Health Maintenance Due  Topic Date Due   Cervical Cancer Screening (HPV/Pap Cotest)  06/19/2018   OPHTHALMOLOGY EXAM  01/13/2019   Mammogram  01/30/2023   Colonoscopy  03/27/2023   HEMOGLOBIN A1C  08/11/2023   Influenza Vaccine  11/21/2023   Diabetic kidney evaluation - eGFR measurement  02/10/2024   Diabetic kidney evaluation - Urine ACR  02/10/2024     Best Wishes,   Dr. Lang

## 2024-02-13 LAB — COMPREHENSIVE METABOLIC PANEL WITH GFR
ALT: 14 IU/L (ref 0–32)
AST: 13 IU/L (ref 0–40)
Albumin: 4.1 g/dL (ref 3.8–4.9)
Alkaline Phosphatase: 66 IU/L (ref 49–135)
BUN/Creatinine Ratio: 15 (ref 9–23)
BUN: 12 mg/dL (ref 6–24)
Bilirubin Total: 0.5 mg/dL (ref 0.0–1.2)
CO2: 24 mmol/L (ref 20–29)
Calcium: 10.3 mg/dL — ABNORMAL HIGH (ref 8.7–10.2)
Chloride: 102 mmol/L (ref 96–106)
Creatinine, Ser: 0.81 mg/dL (ref 0.57–1.00)
Globulin, Total: 3.1 g/dL (ref 1.5–4.5)
Glucose: 98 mg/dL (ref 70–99)
Potassium: 4.4 mmol/L (ref 3.5–5.2)
Sodium: 140 mmol/L (ref 134–144)
Total Protein: 7.2 g/dL (ref 6.0–8.5)
eGFR: 85 mL/min/1.73 (ref >59)

## 2024-02-13 LAB — MICROALBUMIN / CREATININE URINE RATIO
Creatinine, Urine: 97.2 mg/dL
Microalb/Creat Ratio: 6 mg/g{creat} (ref 0–29)
Microalbumin, Urine: 5.7 ug/mL

## 2024-02-13 LAB — LIPID PANEL
Chol/HDL Ratio: 3 ratio (ref 0.0–4.4)
Cholesterol, Total: 207 mg/dL — ABNORMAL HIGH (ref 100–199)
HDL: 70 mg/dL (ref >39)
LDL Chol Calc (NIH): 128 mg/dL — ABNORMAL HIGH (ref 0–99)
Triglycerides: 50 mg/dL (ref 0–149)
VLDL Cholesterol Cal: 9 mg/dL (ref 5–40)

## 2024-02-13 LAB — VITAMIN B12: Vitamin B-12: 379 pg/mL (ref 232–1245)

## 2024-02-13 LAB — HEMOGLOBIN A1C
Est. average glucose Bld gHb Est-mCnc: 126 mg/dL
Hgb A1c MFr Bld: 6 % — ABNORMAL HIGH (ref 4.8–5.6)

## 2024-02-13 LAB — VITAMIN D 25 HYDROXY (VIT D DEFICIENCY, FRACTURES): Vit D, 25-Hydroxy: 32.8 ng/mL (ref 30.0–100.0)

## 2024-02-14 ENCOUNTER — Ambulatory Visit: Payer: Self-pay | Admitting: Family Medicine

## 2024-02-14 DIAGNOSIS — M150 Primary generalized (osteo)arthritis: Secondary | ICD-10-CM

## 2024-02-14 DIAGNOSIS — I517 Cardiomegaly: Secondary | ICD-10-CM

## 2024-02-14 DIAGNOSIS — J4521 Mild intermittent asthma with (acute) exacerbation: Secondary | ICD-10-CM

## 2024-02-14 DIAGNOSIS — E785 Hyperlipidemia, unspecified: Secondary | ICD-10-CM

## 2024-02-14 DIAGNOSIS — I10 Essential (primary) hypertension: Secondary | ICD-10-CM

## 2024-02-14 DIAGNOSIS — J301 Allergic rhinitis due to pollen: Secondary | ICD-10-CM

## 2024-02-14 DIAGNOSIS — E1151 Type 2 diabetes mellitus with diabetic peripheral angiopathy without gangrene: Secondary | ICD-10-CM

## 2024-02-17 DIAGNOSIS — Z23 Encounter for immunization: Secondary | ICD-10-CM | POA: Diagnosis not present

## 2024-02-19 ENCOUNTER — Other Ambulatory Visit (HOSPITAL_COMMUNITY): Payer: Self-pay

## 2024-02-26 ENCOUNTER — Ambulatory Visit: Admitting: Podiatry

## 2024-02-26 ENCOUNTER — Encounter: Payer: Self-pay | Admitting: Podiatry

## 2024-02-26 VITALS — Ht 70.0 in | Wt 234.8 lb

## 2024-02-26 DIAGNOSIS — B351 Tinea unguium: Secondary | ICD-10-CM | POA: Diagnosis not present

## 2024-02-26 DIAGNOSIS — L600 Ingrowing nail: Secondary | ICD-10-CM | POA: Diagnosis not present

## 2024-02-26 DIAGNOSIS — Q828 Other specified congenital malformations of skin: Secondary | ICD-10-CM

## 2024-02-26 NOTE — Progress Notes (Signed)
 Subjective:   Patient ID: Maria Sharp, female   DOB: 58 y.o.   MRN: [id number]   HPI Patient presents stating that she has a cracked second toenail and is worried about infection and it is catching her shoes and she has diabetes has a lesion on the outside of her left foot and was just concerned in general about nail disease   ROS      Objective:  Physical Exam  Neurovascular status intact muscle strength adequate patient was just lost her husband to pancreatic cancer who is found to have a traumatized left second nail medial border where there was a crack lying that extends down to the proximal nail fold.  Has overall elongated nailbeds that get thick and has a lesion on the outside of the left foot     Assessment:  Patient with numerous problems with traumatized second nailbed no indication of infection lesion left and just generalized nail disease     Plan:  H&P all conditions reviewed discussed.  At this point I debrided nailbeds and for the second left I carefully took the corner out and we will allow it to regrow but I did explain it may require a permanent procedure in future.  I did do a small debridement of the lesion courtesy did not see issues and patient will be seen back as needed all questions answered today discussed concerning these different conditions

## 2024-03-08 ENCOUNTER — Ambulatory Visit: Admitting: Podiatry

## 2024-03-29 ENCOUNTER — Ambulatory Visit: Admitting: Podiatry

## 2024-04-01 ENCOUNTER — Encounter: Payer: Self-pay | Admitting: Podiatry

## 2024-04-01 ENCOUNTER — Ambulatory Visit: Admitting: Podiatry

## 2024-04-01 DIAGNOSIS — E1149 Type 2 diabetes mellitus with other diabetic neurological complication: Secondary | ICD-10-CM

## 2024-04-01 DIAGNOSIS — E1140 Type 2 diabetes mellitus with diabetic neuropathy, unspecified: Secondary | ICD-10-CM

## 2024-04-01 DIAGNOSIS — M79609 Pain in unspecified limb: Secondary | ICD-10-CM

## 2024-04-01 DIAGNOSIS — B351 Tinea unguium: Secondary | ICD-10-CM | POA: Diagnosis not present

## 2024-04-02 NOTE — Progress Notes (Signed)
 Subjective:   Patient ID: Maria Sharp, female   DOB: 58 y.o.   MRN: [id number]   HPI Patient presents stating that she is having problems with her nails they have elongated they are irritated and they get sore in the corners.  She does have long-term diabetes and is always concerned about this and has some discoloration around the left big toenail with some crusted tissue   ROS      Objective:  Physical Exam  Neurovascular status unchanged she does have thick incurvated nailbeds 1-5 both feet with moderate irritation of the beds bilateral and she does have diabetes with some crusted tissue on the left hallux     Assessment:  Mycotic nail infection with pain 1-5 bilateral and also some irritative tissue left hallux localized     Plan:  H&P reviewed both and I went ahead today I debrided nailbeds 1-5 both feet no iatrogenic bleeding and I want her to continue using lubricant on the left hallux I think will be no issue but if any issue were to occur she is to let us  know

## 2024-04-27 ENCOUNTER — Ambulatory Visit: Admitting: Podiatry

## 2024-05-05 ENCOUNTER — Ambulatory Visit: Admitting: Podiatry

## 2024-05-05 ENCOUNTER — Encounter: Payer: Self-pay | Admitting: Podiatry

## 2024-05-05 DIAGNOSIS — L853 Xerosis cutis: Secondary | ICD-10-CM

## 2024-05-14 NOTE — Progress Notes (Signed)
"  °  Subjective:  Patient ID: Maria Sharp, female    DOB: 14-Mar-1966,  MRN: [id number]  ANNISTYN DEPASS presents to clinic today for preventative diabetic foot care. Patient concerned about dry skin of both feet. She is looking for recommendations to manage this condition. Denies any pain, redness or drainage.  Chief Complaint  Patient presents with   [facility]    [facility] . A1c 6.0, 02/12/24,  Simmons-Robinson, Makiera, MD (PCP)     PCP is Simmons-Robinson, Rockie, MD.  Allergies[1]  Review of Systems: Negative except as noted in the HPI.  Objective: No changes noted in today's physical examination. There were no vitals filed for this visit. Maria Sharp is a pleasant 59 y.o. female in NAD. AAO x 3.  Vascular Examination: CFT <3 seconds b/l. DP/PT pulses faintly palpable b/l. Pedal edema absent. Skin temperature gradient warm to warm b/l. Digital hair present. No pain with calf compression. No ischemia or gangrene. No cyanosis or clubbing noted b/l. Trace edema noted BLE.   Neurological Examination: Sensation grossly intact b/l with 10 gram monofilament. Vibratory sensation intact b/l.   Dermatological Examination: Pedal skin noted to be dry b/l feet.  No open wounds. No interdigital macerations.  Toenails 1-5 b/l recently debrided.  No corns, calluses, nor porokeratotic lesions.  Musculoskeletal Examination: Muscle strength 5/5 to all lower extremity muscle groups bilaterally. Hammertoe(s) b/l 5th toes. Pes planus deformity noted bilateral LE.  Radiographs: None  Assessment/Plan: 1. Xerosis cutis   -Patient was evaluated today. All questions/concerns addressed on today's visit. -Discussed daily use of OTC moisturizers to address dry skin. Patient related understanding. -Continue foot and shoe inspections daily. Monitor blood glucose per PCP/Endocrinologist's recommendations. -Patient to continue soft, supportive shoe gear daily. -Patient/POA to call should there be  question/concern in the interim.   Return in about 3 months (around 08/03/2024).  Delon LITTIE Merlin, DPM      [place] LOCATION: [address] [place] [address]                   Office [phone]   [place] LOCATION: [address] [place], [place] [address] Office [phone]     [1]  Allergies Allergen Reactions   Ace Inhibitors Anaphylaxis    Other reaction(s): Not available   Shellfish Allergy Anaphylaxis and Other (See Comments)    Only shrimp   Januvia [Sitagliptin] Other (See Comments)    Recurrent abdominal pain with elevated pancreas enzymes   Latex Itching   Lipitor [Atorvastatin]    Shellfish Protein-Containing Drug Products     Other reaction(s): Not available   Shrimp (Diagnostic)     Other Reaction(s): Unknown   Triamcinolone  Acetonide Other (See Comments)   "

## 2024-05-24 ENCOUNTER — Encounter: Admitting: Family Medicine

## 2024-05-24 ENCOUNTER — Encounter: Payer: Self-pay | Admitting: Orthopedic Surgery

## 2024-06-01 ENCOUNTER — Ambulatory Visit: Admitting: Podiatry

## 2024-06-07 ENCOUNTER — Encounter: Admitting: Family Medicine

## 2024-06-28 ENCOUNTER — Ambulatory Visit: Admitting: Podiatry

## 2024-08-23 ENCOUNTER — Ambulatory Visit: Admitting: Podiatry
